# Patient Record
Sex: Female | Born: 1935
Health system: Southern US, Community
[De-identification: ages and names within clinical notes are randomized; demographics above are authoritative.]

## PROBLEM LIST (undated history)

## (undated) DIAGNOSIS — I4819 Other persistent atrial fibrillation: Secondary | ICD-10-CM

## (undated) DIAGNOSIS — N83209 Unspecified ovarian cyst, unspecified side: Secondary | ICD-10-CM

## (undated) DIAGNOSIS — C189 Malignant neoplasm of colon, unspecified: Secondary | ICD-10-CM

## (undated) DIAGNOSIS — K449 Diaphragmatic hernia without obstruction or gangrene: Secondary | ICD-10-CM

## (undated) DIAGNOSIS — D49519 Neoplasm of unspecified behavior of unspecified kidney: Secondary | ICD-10-CM

## (undated) DIAGNOSIS — K219 Gastro-esophageal reflux disease without esophagitis: Secondary | ICD-10-CM

## (undated) DIAGNOSIS — H9190 Unspecified hearing loss, unspecified ear: Secondary | ICD-10-CM

## (undated) DIAGNOSIS — R110 Nausea: Secondary | ICD-10-CM

## (undated) DIAGNOSIS — E785 Hyperlipidemia, unspecified: Secondary | ICD-10-CM

## (undated) DIAGNOSIS — H3530 Unspecified macular degeneration: Secondary | ICD-10-CM

## (undated) DIAGNOSIS — Z974 Presence of external hearing-aid: Secondary | ICD-10-CM

## (undated) DIAGNOSIS — Z973 Presence of spectacles and contact lenses: Secondary | ICD-10-CM

## (undated) DIAGNOSIS — H409 Unspecified glaucoma: Secondary | ICD-10-CM

## (undated) DIAGNOSIS — I712 Thoracic aortic aneurysm, without rupture: Secondary | ICD-10-CM

## (undated) DIAGNOSIS — R112 Nausea with vomiting, unspecified: Secondary | ICD-10-CM

## (undated) DIAGNOSIS — I10 Essential (primary) hypertension: Secondary | ICD-10-CM

## (undated) DIAGNOSIS — H548 Legal blindness, as defined in USA: Secondary | ICD-10-CM

## (undated) DIAGNOSIS — Z9889 Other specified postprocedural states: Secondary | ICD-10-CM

## (undated) HISTORY — DX: Essential (primary) hypertension: I10

## (undated) HISTORY — PX: TONSILLECTOMY: SUR1361

## (undated) HISTORY — DX: Other persistent atrial fibrillation: I48.19

## (undated) HISTORY — PX: NASAL SINUS SURGERY: SHX719

## (undated) HISTORY — DX: Thoracic aortic aneurysm, without rupture: I71.2

---

## 1988-08-12 HISTORY — PX: BREAST CYST EXCISION: SHX579

## 2014-11-07 HISTORY — PX: ESOPHAGOGASTRODUODENOSCOPY: SHX1529

## 2015-09-20 DIAGNOSIS — R1310 Dysphagia, unspecified: Secondary | ICD-10-CM | POA: Diagnosis not present

## 2015-09-20 DIAGNOSIS — E7800 Pure hypercholesterolemia, unspecified: Secondary | ICD-10-CM | POA: Diagnosis not present

## 2015-09-20 DIAGNOSIS — E559 Vitamin D deficiency, unspecified: Secondary | ICD-10-CM | POA: Diagnosis not present

## 2015-09-20 DIAGNOSIS — F329 Major depressive disorder, single episode, unspecified: Secondary | ICD-10-CM | POA: Diagnosis not present

## 2015-09-20 DIAGNOSIS — I10 Essential (primary) hypertension: Secondary | ICD-10-CM | POA: Diagnosis not present

## 2015-09-20 DIAGNOSIS — R001 Bradycardia, unspecified: Secondary | ICD-10-CM | POA: Diagnosis not present

## 2015-10-05 DIAGNOSIS — H353213 Exudative age-related macular degeneration, right eye, with inactive scar: Secondary | ICD-10-CM | POA: Diagnosis not present

## 2015-10-05 DIAGNOSIS — H353122 Nonexudative age-related macular degeneration, left eye, intermediate dry stage: Secondary | ICD-10-CM | POA: Diagnosis not present

## 2015-10-27 DIAGNOSIS — Z719 Counseling, unspecified: Secondary | ICD-10-CM | POA: Diagnosis not present

## 2015-10-27 DIAGNOSIS — H6121 Impacted cerumen, right ear: Secondary | ICD-10-CM | POA: Diagnosis not present

## 2015-12-06 DIAGNOSIS — H401114 Primary open-angle glaucoma, right eye, indeterminate stage: Secondary | ICD-10-CM | POA: Diagnosis not present

## 2015-12-06 DIAGNOSIS — H353213 Exudative age-related macular degeneration, right eye, with inactive scar: Secondary | ICD-10-CM | POA: Diagnosis not present

## 2015-12-06 DIAGNOSIS — H401121 Primary open-angle glaucoma, left eye, mild stage: Secondary | ICD-10-CM | POA: Diagnosis not present

## 2015-12-06 DIAGNOSIS — H353121 Nonexudative age-related macular degeneration, left eye, early dry stage: Secondary | ICD-10-CM | POA: Diagnosis not present

## 2016-03-22 DIAGNOSIS — H401121 Primary open-angle glaucoma, left eye, mild stage: Secondary | ICD-10-CM | POA: Diagnosis not present

## 2016-03-26 DIAGNOSIS — R1310 Dysphagia, unspecified: Secondary | ICD-10-CM | POA: Diagnosis not present

## 2016-03-26 DIAGNOSIS — F329 Major depressive disorder, single episode, unspecified: Secondary | ICD-10-CM | POA: Diagnosis not present

## 2016-03-26 DIAGNOSIS — R001 Bradycardia, unspecified: Secondary | ICD-10-CM | POA: Diagnosis not present

## 2016-03-26 DIAGNOSIS — I10 Essential (primary) hypertension: Secondary | ICD-10-CM | POA: Diagnosis not present

## 2016-03-26 DIAGNOSIS — H3530 Unspecified macular degeneration: Secondary | ICD-10-CM | POA: Diagnosis not present

## 2016-03-26 DIAGNOSIS — E7800 Pure hypercholesterolemia, unspecified: Secondary | ICD-10-CM | POA: Diagnosis not present

## 2016-03-26 DIAGNOSIS — E559 Vitamin D deficiency, unspecified: Secondary | ICD-10-CM | POA: Diagnosis not present

## 2016-03-26 DIAGNOSIS — M810 Age-related osteoporosis without current pathological fracture: Secondary | ICD-10-CM | POA: Diagnosis not present

## 2016-04-04 DIAGNOSIS — H353122 Nonexudative age-related macular degeneration, left eye, intermediate dry stage: Secondary | ICD-10-CM | POA: Diagnosis not present

## 2016-04-04 DIAGNOSIS — H353213 Exudative age-related macular degeneration, right eye, with inactive scar: Secondary | ICD-10-CM | POA: Diagnosis not present

## 2016-05-09 DIAGNOSIS — R1084 Generalized abdominal pain: Secondary | ICD-10-CM | POA: Diagnosis not present

## 2016-05-09 DIAGNOSIS — Z23 Encounter for immunization: Secondary | ICD-10-CM | POA: Diagnosis not present

## 2016-05-22 DIAGNOSIS — R109 Unspecified abdominal pain: Secondary | ICD-10-CM | POA: Diagnosis not present

## 2016-06-20 DIAGNOSIS — Z1231 Encounter for screening mammogram for malignant neoplasm of breast: Secondary | ICD-10-CM | POA: Diagnosis not present

## 2016-07-12 DIAGNOSIS — H401121 Primary open-angle glaucoma, left eye, mild stage: Secondary | ICD-10-CM | POA: Diagnosis not present

## 2016-07-12 DIAGNOSIS — H401114 Primary open-angle glaucoma, right eye, indeterminate stage: Secondary | ICD-10-CM | POA: Diagnosis not present

## 2016-07-25 DIAGNOSIS — R1030 Lower abdominal pain, unspecified: Secondary | ICD-10-CM | POA: Diagnosis not present

## 2016-07-25 DIAGNOSIS — R194 Change in bowel habit: Secondary | ICD-10-CM | POA: Diagnosis not present

## 2016-07-25 DIAGNOSIS — K5900 Constipation, unspecified: Secondary | ICD-10-CM | POA: Diagnosis not present

## 2016-09-02 DIAGNOSIS — K5790 Diverticulosis of intestine, part unspecified, without perforation or abscess without bleeding: Secondary | ICD-10-CM | POA: Diagnosis not present

## 2016-09-02 DIAGNOSIS — I10 Essential (primary) hypertension: Secondary | ICD-10-CM | POA: Diagnosis not present

## 2016-09-02 DIAGNOSIS — K56699 Other intestinal obstruction unspecified as to partial versus complete obstruction: Secondary | ICD-10-CM | POA: Diagnosis not present

## 2016-09-02 DIAGNOSIS — Z1211 Encounter for screening for malignant neoplasm of colon: Secondary | ICD-10-CM | POA: Diagnosis not present

## 2016-09-02 DIAGNOSIS — C189 Malignant neoplasm of colon, unspecified: Secondary | ICD-10-CM | POA: Diagnosis not present

## 2016-09-04 HISTORY — PX: COLONOSCOPY: SHX174

## 2016-09-06 DIAGNOSIS — R1030 Lower abdominal pain, unspecified: Secondary | ICD-10-CM | POA: Diagnosis not present

## 2016-09-10 DIAGNOSIS — K56699 Other intestinal obstruction unspecified as to partial versus complete obstruction: Secondary | ICD-10-CM | POA: Diagnosis not present

## 2016-09-10 DIAGNOSIS — K5900 Constipation, unspecified: Secondary | ICD-10-CM | POA: Diagnosis not present

## 2016-09-10 DIAGNOSIS — R935 Abnormal findings on diagnostic imaging of other abdominal regions, including retroperitoneum: Secondary | ICD-10-CM | POA: Diagnosis not present

## 2016-09-10 DIAGNOSIS — R1030 Lower abdominal pain, unspecified: Secondary | ICD-10-CM | POA: Diagnosis not present

## 2016-09-24 DIAGNOSIS — C189 Malignant neoplasm of colon, unspecified: Secondary | ICD-10-CM | POA: Diagnosis not present

## 2016-09-24 DIAGNOSIS — D4102 Neoplasm of uncertain behavior of left kidney: Secondary | ICD-10-CM | POA: Diagnosis not present

## 2016-09-26 ENCOUNTER — Other Ambulatory Visit: Payer: Self-pay | Admitting: Urology

## 2016-10-02 ENCOUNTER — Ambulatory Visit: Payer: Self-pay | Admitting: Surgery

## 2016-10-02 DIAGNOSIS — C187 Malignant neoplasm of sigmoid colon: Secondary | ICD-10-CM | POA: Diagnosis not present

## 2016-10-02 DIAGNOSIS — R160 Hepatomegaly, not elsewhere classified: Secondary | ICD-10-CM | POA: Diagnosis not present

## 2016-10-02 DIAGNOSIS — N839 Noninflammatory disorder of ovary, fallopian tube and broad ligament, unspecified: Secondary | ICD-10-CM | POA: Diagnosis not present

## 2016-10-02 DIAGNOSIS — R918 Other nonspecific abnormal finding of lung field: Secondary | ICD-10-CM | POA: Diagnosis not present

## 2016-10-02 DIAGNOSIS — Z01818 Encounter for other preprocedural examination: Secondary | ICD-10-CM | POA: Diagnosis not present

## 2016-10-02 DIAGNOSIS — K8000 Calculus of gallbladder with acute cholecystitis without obstruction: Secondary | ICD-10-CM | POA: Diagnosis not present

## 2016-10-02 DIAGNOSIS — N2889 Other specified disorders of kidney and ureter: Secondary | ICD-10-CM | POA: Diagnosis not present

## 2016-10-03 ENCOUNTER — Encounter (HOSPITAL_BASED_OUTPATIENT_CLINIC_OR_DEPARTMENT_OTHER): Payer: Self-pay | Admitting: *Deleted

## 2016-10-03 NOTE — Progress Notes (Signed)
SPOKE W/ PT'S DAUGHTER-N-LAW, [name].  PT IS HOH EVEN W/ HEARING AIDS, WILL NEED HER IN PRE-OP.   NPO AFTER MN.  ARRIVE AT [id number].  NEEDS ISTAT 8 AND EKG.  WILL TAKE AM MEDS DOS W/ SIPS OF WATER WITH EXCEPTION NO VITAMINS /  BENAZEPRIL/HCTZ.

## 2016-10-04 ENCOUNTER — Other Ambulatory Visit (HOSPITAL_COMMUNITY): Payer: Self-pay | Admitting: Surgery

## 2016-10-04 DIAGNOSIS — Z85038 Personal history of other malignant neoplasm of large intestine: Secondary | ICD-10-CM

## 2016-10-10 ENCOUNTER — Ambulatory Visit (HOSPITAL_BASED_OUTPATIENT_CLINIC_OR_DEPARTMENT_OTHER)
Admission: RE | Admit: 2016-10-10 | Discharge: 2016-10-10 | Disposition: A | Discharge status: Home / Self Care | Payer: Medicare Other | Source: Ambulatory Visit | Attending: Urology | Admitting: Urology

## 2016-10-10 ENCOUNTER — Encounter (HOSPITAL_BASED_OUTPATIENT_CLINIC_OR_DEPARTMENT_OTHER)
Admission: RE | Disposition: A | Discharge status: Home / Self Care | Payer: Self-pay | Source: Ambulatory Visit | Attending: Urology

## 2016-10-10 ENCOUNTER — Other Ambulatory Visit: Payer: Self-pay

## 2016-10-10 ENCOUNTER — Ambulatory Visit (HOSPITAL_BASED_OUTPATIENT_CLINIC_OR_DEPARTMENT_OTHER): Payer: Medicare Other | Admitting: Anesthesiology

## 2016-10-10 ENCOUNTER — Encounter (HOSPITAL_BASED_OUTPATIENT_CLINIC_OR_DEPARTMENT_OTHER): Payer: Self-pay | Admitting: *Deleted

## 2016-10-10 DIAGNOSIS — H3530 Unspecified macular degeneration: Secondary | ICD-10-CM | POA: Diagnosis not present

## 2016-10-10 DIAGNOSIS — I10 Essential (primary) hypertension: Secondary | ICD-10-CM | POA: Diagnosis not present

## 2016-10-10 DIAGNOSIS — D4102 Neoplasm of uncertain behavior of left kidney: Secondary | ICD-10-CM | POA: Diagnosis not present

## 2016-10-10 DIAGNOSIS — Z79899 Other long term (current) drug therapy: Secondary | ICD-10-CM | POA: Diagnosis not present

## 2016-10-10 DIAGNOSIS — K219 Gastro-esophageal reflux disease without esophagitis: Secondary | ICD-10-CM | POA: Insufficient documentation

## 2016-10-10 DIAGNOSIS — H5461 Unqualified visual loss, right eye, normal vision left eye: Secondary | ICD-10-CM | POA: Insufficient documentation

## 2016-10-10 DIAGNOSIS — C189 Malignant neoplasm of colon, unspecified: Secondary | ICD-10-CM | POA: Diagnosis not present

## 2016-10-10 DIAGNOSIS — E785 Hyperlipidemia, unspecified: Secondary | ICD-10-CM | POA: Insufficient documentation

## 2016-10-10 DIAGNOSIS — N289 Disorder of kidney and ureter, unspecified: Secondary | ICD-10-CM | POA: Diagnosis not present

## 2016-10-10 DIAGNOSIS — D49512 Neoplasm of unspecified behavior of left kidney: Secondary | ICD-10-CM | POA: Diagnosis not present

## 2016-10-10 HISTORY — DX: Neoplasm of unspecified behavior of unspecified kidney: D49.519

## 2016-10-10 HISTORY — DX: Presence of spectacles and contact lenses: Z97.3

## 2016-10-10 HISTORY — DX: Presence of external hearing-aid: Z97.4

## 2016-10-10 HISTORY — DX: Diaphragmatic hernia without obstruction or gangrene: K44.9

## 2016-10-10 HISTORY — DX: Gastro-esophageal reflux disease without esophagitis: K21.9

## 2016-10-10 HISTORY — DX: Unspecified macular degeneration: H35.30

## 2016-10-10 HISTORY — PX: CYSTOSCOPY WITH RETROGRADE PYELOGRAM, URETEROSCOPY AND STENT PLACEMENT: SHX5789

## 2016-10-10 HISTORY — DX: Unspecified ovarian cyst, unspecified side: N83.209

## 2016-10-10 HISTORY — DX: Nausea: R11.0

## 2016-10-10 HISTORY — DX: Unspecified glaucoma: H40.9

## 2016-10-10 HISTORY — DX: Legal blindness, as defined in USA: H54.8

## 2016-10-10 HISTORY — DX: Hyperlipidemia, unspecified: E78.5

## 2016-10-10 HISTORY — DX: Unspecified hearing loss, unspecified ear: H91.90

## 2016-10-10 HISTORY — DX: Malignant neoplasm of colon, unspecified: C18.9

## 2016-10-10 LAB — POCT I-STAT, CHEM 8
BUN: 11 mg/dL (ref 6–20)
CALCIUM ION: 1.25 mmol/L (ref 1.15–1.40)
CHLORIDE: 103 mmol/L (ref 101–111)
CREATININE: 0.7 mg/dL (ref 0.44–1.00)
GLUCOSE: 99 mg/dL (ref 65–99)
HCT: 41 % (ref 36.0–46.0)
Hemoglobin: 13.9 g/dL (ref 12.0–15.0)
Potassium: 4 mmol/L (ref 3.5–5.1)
Sodium: 143 mmol/L (ref 135–145)
TCO2: 26 mmol/L (ref 0–100)

## 2016-10-10 SURGERY — CYSTOURETEROSCOPY, WITH RETROGRADE PYELOGRAM AND STENT INSERTION
Anesthesia: General | Site: Renal | Laterality: Left

## 2016-10-10 MED ORDER — FENTANYL CITRATE (PF) 100 MCG/2ML IJ SOLN
25.0000 ug | INTRAMUSCULAR | Status: DC | PRN
  Filled 2016-10-10: qty 1

## 2016-10-10 MED ORDER — PROPOFOL 10 MG/ML IV BOLUS
INTRAVENOUS | Status: AC
  Filled 2016-10-10: qty 20

## 2016-10-10 MED ORDER — DEXTROSE 5 % IV SOLN: 5.0000 mg/kg | INTRAVENOUS | Status: DC

## 2016-10-10 MED ORDER — IOHEXOL 300 MG/ML  SOLN
INTRAMUSCULAR | Status: DC | PRN
  Administered 2016-10-10: 15 mL

## 2016-10-10 MED ORDER — PROMETHAZINE HCL 25 MG/ML IJ SOLN
6.2500 mg | INTRAMUSCULAR | Status: DC | PRN
  Filled 2016-10-10: qty 1

## 2016-10-10 MED ORDER — LACTATED RINGERS IV SOLN
INTRAVENOUS | Status: DC
  Administered 2016-10-10: 12:00:00 via INTRAVENOUS
  Filled 2016-10-10: qty 1000

## 2016-10-10 MED ORDER — SODIUM CHLORIDE 0.9 % IR SOLN
Status: DC | PRN
  Administered 2016-10-10: 3000 mL

## 2016-10-10 MED ORDER — PROPOFOL 10 MG/ML IV BOLUS
INTRAVENOUS | Status: DC | PRN
  Administered 2016-10-10: 150 mg via INTRAVENOUS
  Administered 2016-10-10: 50 mg via INTRAVENOUS

## 2016-10-10 MED ORDER — GENTAMICIN SULFATE 40 MG/ML IJ SOLN
320.0000 mg | Freq: Once | INTRAVENOUS | Status: AC
  Administered 2016-10-10: 320 mg via INTRAVENOUS
  Filled 2016-10-10: qty 8

## 2016-10-10 MED ORDER — BUPIVACAINE LIPOSOME 1.3 % IJ SUSP
20.0000 mL | INTRAMUSCULAR | Status: DC
  Filled 2016-10-10: qty 20

## 2016-10-10 MED ORDER — LIDOCAINE 2% (20 MG/ML) 5 ML SYRINGE
INTRAMUSCULAR | Status: DC | PRN
  Administered 2016-10-10: 40 mg via INTRAVENOUS
  Administered 2016-10-10: 60 mg via INTRAVENOUS

## 2016-10-10 MED ORDER — FENTANYL CITRATE (PF) 100 MCG/2ML IJ SOLN
INTRAMUSCULAR | Status: DC | PRN
  Administered 2016-10-10 (×4): 25 ug via INTRAVENOUS

## 2016-10-10 MED ORDER — ONDANSETRON HCL 4 MG/2ML IJ SOLN
INTRAMUSCULAR | Status: DC | PRN
  Administered 2016-10-10: 4 mg via INTRAVENOUS

## 2016-10-10 MED ORDER — PHENYLEPHRINE 40 MCG/ML (10ML) SYRINGE FOR IV PUSH (FOR BLOOD PRESSURE SUPPORT)
PREFILLED_SYRINGE | INTRAVENOUS | Status: DC | PRN
  Administered 2016-10-10 (×4): 80 ug via INTRAVENOUS

## 2016-10-10 MED ORDER — FENTANYL CITRATE (PF) 100 MCG/2ML IJ SOLN
INTRAMUSCULAR | Status: AC
  Filled 2016-10-10: qty 2

## 2016-10-10 MED ORDER — TRAMADOL HCL 50 MG PO TABS
ORAL_TABLET | ORAL | Status: AC
Start: 2016-10-10 — End: 2016-10-10
  Filled 2016-10-10: qty 1

## 2016-10-10 MED ORDER — TRAMADOL HCL 50 MG PO TABS
50.0000 mg | ORAL_TABLET | Freq: Once | ORAL | Status: AC
  Administered 2016-10-10: 50 mg via ORAL
  Filled 2016-10-10: qty 1

## 2016-10-10 MED ORDER — KETOROLAC TROMETHAMINE 30 MG/ML IJ SOLN
15.0000 mg | Freq: Once | INTRAMUSCULAR | Status: DC | PRN
  Filled 2016-10-10: qty 1

## 2016-10-10 MED ORDER — TRAMADOL HCL 50 MG PO TABS: 50.0000 mg | ORAL_TABLET | Freq: Four times a day (QID) | ORAL | 0 refills | Status: DC | PRN

## 2016-10-10 MED ORDER — DEXAMETHASONE SODIUM PHOSPHATE 4 MG/ML IJ SOLN
INTRAMUSCULAR | Status: DC | PRN
  Administered 2016-10-10: 8 mg via INTRAVENOUS

## 2016-10-10 SURGICAL SUPPLY — 30 items
BAG DRAIN URO-CYSTO SKYTR STRL (DRAIN) ×3 IMPLANT
BASKET DAKOTA 1.9FR 11X120 (BASKET) IMPLANT
BASKET LASER NITINOL 1.9FR (BASKET) IMPLANT
BASKET ZERO TIP NITINOL 2.4FR (BASKET) IMPLANT
CATH INTERMIT  6FR 70CM (CATHETERS) ×3 IMPLANT
CLOTH BEACON ORANGE TIMEOUT ST (SAFETY) ×3 IMPLANT
FIBER LASER FLEXIVA 365 (UROLOGICAL SUPPLIES) IMPLANT
FIBER LASER TRAC TIP (UROLOGICAL SUPPLIES) IMPLANT
GLOVE BIO SURGEON STRL SZ 6.5 (GLOVE) ×2 IMPLANT
GLOVE BIO SURGEON STRL SZ7.5 (GLOVE) ×3 IMPLANT
GLOVE BIO SURGEONS STRL SZ 6.5 (GLOVE) ×1
GLOVE BIOGEL PI IND STRL 6.5 (GLOVE) ×2 IMPLANT
GLOVE BIOGEL PI INDICATOR 6.5 (GLOVE) ×4
GOWN STRL REUS W/ TWL LRG LVL3 (GOWN DISPOSABLE) ×2 IMPLANT
GOWN STRL REUS W/TWL LRG LVL3 (GOWN DISPOSABLE) ×10 IMPLANT
GUIDEWIRE ANG ZIPWIRE 038X150 (WIRE) ×3 IMPLANT
GUIDEWIRE STR DUAL SENSOR (WIRE) ×3 IMPLANT
IV NS 1000ML (IV SOLUTION) ×2
IV NS 1000ML BAXH (IV SOLUTION) ×1 IMPLANT
IV NS IRRIG 3000ML ARTHROMATIC (IV SOLUTION) ×3 IMPLANT
KIT RM TURNOVER CYSTO AR (KITS) ×3 IMPLANT
MANIFOLD NEPTUNE II (INSTRUMENTS) ×3 IMPLANT
NS IRRIG 500ML POUR BTL (IV SOLUTION) ×3 IMPLANT
PACK CYSTO (CUSTOM PROCEDURE TRAY) ×3 IMPLANT
SHEATH ACCESS URETERAL 24CM (SHEATH) ×3 IMPLANT
STENT POLARIS 5FRX24 (STENTS) ×3 IMPLANT
SYRINGE 10CC LL (SYRINGE) ×3 IMPLANT
TUBE CONNECTING 12'X1/4 (SUCTIONS)
TUBE CONNECTING 12X1/4 (SUCTIONS) IMPLANT
TUBE FEEDING 8FR 16IN STR KANG (MISCELLANEOUS) ×3 IMPLANT

## 2016-10-10 NOTE — Anesthesia Preprocedure Evaluation (Addendum)
Anesthesia Evaluation  Patient identified by MRN, date of birth, ID band Patient awake  General Assessment Comment:Colon cancer [facility])  dx via biospy from colonoscopy-- malignant ---  currently having work-up done w/ dr gross (general surgeon) and coordinate surgery w/ urologsit for renal tumor   Reviewed: Allergy & Precautions, NPO status , Patient's Chart, lab work & pertinent test results  Airway Mallampati: II  TM Distance: >3 FB Neck ROM: Full    Dental no notable dental hx.    Pulmonary neg pulmonary ROS,    Pulmonary exam normal breath sounds clear to auscultation       Cardiovascular hypertension, Pt. on medications  Rhythm:Irregular Rate:Normal     Neuro/Psych negative neurological ROS  negative psych ROS   GI/Hepatic Neg liver ROS, GERD  Medicated,  Endo/Other  negative endocrine ROS  Renal/GU negative Renal ROS  negative genitourinary   Musculoskeletal negative musculoskeletal ROS (+)   Abdominal   Peds negative pediatric ROS (+)  Hematology negative hematology ROS (+)   Anesthesia Other Findings   Reproductive/Obstetrics negative OB ROS                            Anesthesia Physical Anesthesia Plan  ASA: III  Anesthesia Plan: General   Post-op Pain Management:    Induction: Intravenous  Airway Management Planned: LMA  Additional Equipment:   Intra-op Plan:   Post-operative Plan: Extubation in OR  Informed Consent: I have reviewed the patients History and Physical, chart, labs and discussed the procedure including the risks, benefits and alternatives for the proposed anesthesia with the patient or authorized representative who has indicated his/her understanding and acceptance.   Dental advisory given  Plan Discussed with: CRNA and Surgeon  Anesthesia Plan Comments:        Anesthesia Quick Evaluation

## 2016-10-10 NOTE — Anesthesia Postprocedure Evaluation (Signed)
Anesthesia Post Note  Patient: Mary [name]  Procedure(s) Performed: Procedure(s) (LRB): CYSTOSCOPY WITH RETROGRADE PYELOGRAM, DIAGNOSTIC URETEROSCOPY  AND STENT PLACEMENT (Left)  Patient location during evaluation: PACU Anesthesia Type: General Level of consciousness: awake and alert Pain management: pain level controlled Vital Signs Assessment: post-procedure vital signs reviewed and stable Respiratory status: spontaneous breathing, nonlabored ventilation, respiratory function stable and patient connected to nasal cannula oxygen Cardiovascular status: blood pressure returned to baseline and stable Postop Assessment: no signs of nausea or vomiting Anesthetic complications: no Comments: New onset a fib. Cardiology consulted. Rate in the 80's, ok to go home. Cardiology will call her tonight or tomorrow to set up appointment to be evaluated.l       Last Vitals:  Vitals:   10/10/16 1245 10/10/16 1300  BP: (!) 153/86 (!) 146/100  Pulse: 89 87  Resp: 19 15  Temp:      Last Pain:  Vitals:   10/10/16 1031  TempSrc: Oral                 [name]

## 2016-10-10 NOTE — H&P (Signed)
Mary [name] is an 83 y.o. female.    Chief Complaint: Pre-op LEFT Ureteroscopy with biopsy  HPI:   1 - Endophytic Left Renal Mass - enophytic left upper pole / hilar mass by CT 2018 on eval abd pain. 2.7cm, 1 artery / 1 vein left renovascular anatomy. Some small perirenal nodules within gerotas noted as well. NO h/o hematuria. Mass appears to be ? arising from upper pole collecting system.   2 - Colon Cancer - new colon cancer DX'd by GI MD in [place].  This was found on eval changed bowel habits and constipation.   PMH sig for colon cancer (DX'd 08/2016 by colonocopy), HLD, HTN. NO h/o ischemic CV disease / blood thinners. No priro chest or abdominal surgeries. Her PCP is Moshe Cipro MD in [place].   Today " [name] " is seen to proceed with LEFT ureteroscopy / biopsy to further characterize endophytic left renal mass. NO interval fevers. Most recent UA without infectious parameters.    Past Medical History:  Diagnosis Date  . Colon cancer [facility])    dx via biospy from colonoscopy-- malignant ---  currently having work-up done w/ dr gross (general surgeon) and coordinate surgery w/ urologsit for renal tumor  . GERD (gastroesophageal reflux disease)   . Glaucoma   . Hiatal hernia   . HOH (hard of hearing)    bilateral even w/ hearing aids  . Hyperlipidemia   . Hypertension   . Kidney tumor    left side  . Legally blind in right eye, as defined in [place]    due to macular degeneration  . Macular degeneration   . Nausea    intermittant due to colon mass  . Ovarian cyst   . Wears glasses   . Wears hearing aid    bilateral    Past Surgical History:  Procedure Laterality Date  . BREAST CYST EXCISION  1990   benign  . COLONOSCOPY  09/04/2016  . ESOPHAGOGASTRODUODENOSCOPY  11/07/2014  . NASAL SINUS SURGERY    . TONSILLECTOMY      History reviewed. No pertinent family history. Social History:  reports that she has never smoked. She has never used smokeless tobacco. She  reports that she does not drink alcohol or use drugs.  Allergies:  Allergies  Allergen Reactions  . Bactrim [Sulfamethoxazole-Trimethoprim] Nausea And Vomiting    No prescriptions prior to admission.    No results found for this or any previous visit (from the past 48 hour(s)). No results found.  Review of Systems  Constitutional: Negative.  Negative for chills and fever.  HENT: Negative.   Eyes: Negative.   Respiratory: Negative.   Cardiovascular: Negative.   Gastrointestinal: Negative.   Genitourinary: Negative.   Musculoskeletal: Negative.   Skin: Negative.   Neurological: Negative.   Endo/Heme/Allergies: Negative.   Psychiatric/Behavioral: Negative.     Height 5\' 6"  (1.676 m), weight 80.7 kg (178 lb). Physical Exam  Constitutional: She appears well-developed.  HENT:  Head: Normocephalic.  Eyes: Pupils are equal, round, and reactive to light.  Neck: Normal range of motion.  Cardiovascular: Normal rate.   Respiratory: Effort normal.  GI: Soft.  Genitourinary:  Genitourinary Comments: No CVAT at present.   Neurological: She is alert.  Skin: Skin is warm.  Psychiatric: She has a normal mood and affect.     Assessment/Plan  1 - Endophytic Left Renal Mass - proceed as planned with cysto, retrograges, and LEFT ureteroscopy / possible biopsy to furhter characterize left renal mass  and hopefully distinguish if urothelial origin or not. Risks, benefits, alternatives, expected peri-op course, need for peri-op ureteral stenting, need for possible stage approach discussed previously and reiterated today.   Alexis Frock, MD 10/10/2016, 6:38 AM

## 2016-10-10 NOTE — Discharge Instructions (Signed)
1 - You may have urinary urgency (bladder spasms) and bloody urine on / off with stent in place. This is normal.  2 - Remove tethered stent on Friday morning at home by pulling on string, then blue-white plastic tubing, and discarding. Office is open Friday in any acute issues arise.   3 - Call MD or go to ER for fever >102, severe pain / nausea / vomiting not relieved by medications, or acute change in medical status [facility] [phone] Post Ureteroscopy With or Without Stent Instructions  Definitions:  Ureter: The duct that transports urine from the kidney to the bladder. Stent:   A plastic hollow tube that is placed into the ureter, from the kidney to the                 bladder to prevent the ureter from swelling shut.  GENERAL INSTRUCTIONS:  Despite the fact that no skin incisions were used, the area around the ureter and bladder is raw and irritated. The stent is a foreign body which will further irritate the bladder wall. This irritation is manifested by increased frequency of urination, both day and night, and by an increase in the urge to urinate. In some, the urge to urinate is present almost always. Sometimes the urge is strong enough that you may not be able to stop yourself from urinating. The only real cure is to remove the stent and then give time for the bladder wall to heal which can't be done until the danger of the ureter swelling shut has passed, which varies.  You may see some blood in your urine while the stent is in place and a few days afterwards. Do not be alarmed, even if the urine was clear for a while. Get off your feet and drink lots of fluids until clearing occurs. If you start to pass clots or don't improve, call us.  DIET: You may return to your normal diet immediately. Because of the raw surface of your bladder, alcohol, spicy foods, acid type foods and drinks with caffeine may cause irritation or frequency and should be used in moderation.  To keep your urine flowing freely and to avoid constipation, drink plenty of fluids during the day ( 8-10 glasses ). Tip: Avoid cranberry juice because it is very acidic.  ACTIVITY: Your physical activity doesn't need to be restricted. However, if you are very active, you may see some blood in your urine. We suggest that you reduce your activity under these circumstances until the bleeding has stopped.  BOWELS: It is important to keep your bowels regular during the postoperative period. Straining with bowel movements can cause bleeding. A bowel movement every other day is reasonable. Use a mild laxative if needed, such as Milk of Magnesia 2-3 tablespoons, or 2 Dulcolax tablets. Call if you continue to have problems. If you have been taking narcotics for pain, before, during or after your surgery, you may be constipated. Take a laxative if necessary.   MEDICATION: You should resume your pre-surgery medications unless told not to. In addition you will often be given an antibiotic to prevent infection. These should be taken as prescribed until the bottles are finished unless you are having an unusual reaction to one of the drugs.  PROBLEMS YOU SHOULD REPORT TO [place]:  Fevers over 100.5 Fahrenheit.  Heavy bleeding, or clots ( See above notes about blood in urine ).  Inability to urinate.  Drug reactions ( hives, rash, nausea, vomiting, diarrhea ).  Severe burning or pain with urination that is not improving.  FOLLOW-UP: You will need a follow-up appointment to monitor your progress. Call for this appointment at the number listed above. Usually the first appointment will be about three to fourteen days after your surgery.      Post Anesthesia Home Care Instructions  Activity: Get plenty of rest for the remainder of the day. A responsible adult should stay with you for 24 hours following the procedure.  For the next 24 hours, DO NOT: -Drive a car -Paediatric nurse -Drink alcoholic  beverages -Take any medication unless instructed by your physician -Make any legal decisions or sign important papers.  Meals: Start with liquid foods such as gelatin or soup. Progress to regular foods as tolerated. Avoid greasy, spicy, heavy foods. If nausea and/or vomiting occur, drink only clear liquids until the nausea and/or vomiting subsides. Call your physician if vomiting continues.  Special Instructions/Symptoms: Your throat may feel dry or sore from the anesthesia or the breathing tube placed in your throat during surgery. If this causes discomfort, gargle with warm salt water. The discomfort should disappear within 24 hours.  If you had a scopolamine patch placed behind your ear for the management of post- operative nausea and/or vomiting:  1. The medication in the patch is effective for 72 hours, after which it should be removed.  Wrap patch in a tissue and discard in the trash. Wash hands thoroughly with soap and water. 2. You may remove the patch earlier than 72 hours if you experience unpleasant side effects which may include dry mouth, dizziness or visual disturbances. 3. Avoid touching the patch. Wash your hands with soap and water after contact with the patch.

## 2016-10-10 NOTE — Transfer of Care (Signed)
Last Vitals:  Vitals:   10/10/16 1031 10/10/16 1243  BP: (!) 156/78   Pulse: 96   Resp: 16   Temp: 36.6 C (P) 37.1 C    Last Pain:  Vitals:   10/10/16 1031  TempSrc: Oral         Immediate Anesthesia Transfer of Care Note  Patient: Mary [name]  Procedure(s) Performed: Procedure(s) (LRB): CYSTOSCOPY WITH RETROGRADE PYELOGRAM, DIAGNOSTIC URETEROSCOPY  AND STENT PLACEMENT (Left)  Patient Location: PACU  Anesthesia Type: General  Level of Consciousness: awake, alert  and oriented  Airway & Oxygen Therapy: Patient Spontanous Breathing and Patient connected to face mask oxygen  Post-op Assessment: Report given to PACU RN and Post -op Vital signs reviewed and stable  Post vital signs: Reviewed and stable. Questionable Afib on monitor.  12 Lead EKG obtained in PACU shows Afib.  Dr. Kalman Shan aware. Cariology consult requested by Dr. Kalman Shan.  Complications: No apparent anesthesia complications

## 2016-10-10 NOTE — Anesthesia Procedure Notes (Signed)
Procedure Name: LMA Insertion Date/Time: 10/10/2016 12:06 PM Performed by: Myrtie Soman Pre-anesthesia Checklist: Patient identified, Emergency Drugs available, Suction available and Patient being monitored Patient Re-evaluated:Patient Re-evaluated prior to inductionOxygen Delivery Method: Circle system utilized Preoxygenation: Pre-oxygenation with 100% oxygen Intubation Type: IV induction Ventilation: Mask ventilation without difficulty LMA: LMA inserted LMA Size: 4.0 Number of attempts: 1 Airway Equipment and Method: Bite block Placement Confirmation: positive ETCO2 Tube secured with: Tape Dental Injury: Teeth and Oropharynx as per pre-operative assessment

## 2016-10-10 NOTE — Brief Op Note (Signed)
10/10/2016  12:26 PM  PATIENT:  Mary [name]  83 y.o. female  PRE-OPERATIVE DIAGNOSIS:  LEFT RENAL TUMOR  POST-OPERATIVE DIAGNOSIS:  LEFT RENAL TUMOR  PROCEDURE:  Procedure(s): CYSTOSCOPY WITH RETROGRADE PYELOGRAM, DIAGNOSTIC URETEROSCOPY  AND STENT PLACEMENT (Left)  SURGEON:  Surgeon(s) and Role:    * Alexis Frock, MD - Primary  PHYSICIAN ASSISTANT:   ASSISTANTS: none   ANESTHESIA:   general  EBL:  No intake/output data recorded.  BLOOD ADMINISTERED:none  DRAINS: none   LOCAL MEDICATIONS USED:  NONE  SPECIMEN:  No Specimen  DISPOSITION OF SPECIMEN:  N/A  COUNTS:  YES  TOURNIQUET:  * No tourniquets in log *  DICTATION: .Other Dictation: Dictation Number [id number]  PLAN OF CARE: Discharge to home after PACU  PATIENT DISPOSITION:  PACU - hemodynamically stable.   Delay start of Pharmacological VTE agent (>24hrs) due to surgical blood loss or risk of bleeding: yes

## 2016-10-11 ENCOUNTER — Encounter (HOSPITAL_BASED_OUTPATIENT_CLINIC_OR_DEPARTMENT_OTHER): Payer: Self-pay | Admitting: Urology

## 2016-10-11 NOTE — Op Note (Signed)
NAMEMALLORIE, Mary [name] NO.:  [id number]  MEDICAL RECORD NO.:  [id number]  LOCATION:                                 FACILITY:  PHYSICIAN:  Alexis Frock, MD     DATE OF BIRTH:  02/07/1933  DATE OF PROCEDURE: 10/10/2016                              OPERATIVE REPORT   DIAGNOSIS:  Left endophytic renal mass.  PROCEDURE: 1. Cystoscopy, left retrograde pyelogram interpretation. 2. Left diagnostic ureteroscopy. 3. Insertion of left ureteral stent, 5 x 24 Polaris with tether.  ESTIMATED BLOOD LOSS:  Nil.  COMPLICATION:  None.  SPECIMEN:  None.  FINDINGS: 1. Unremarkable bladder. 2. Unremarkable left retrograde pyelogram. 3. No evidence of intraluminal urothelial neoplasm whatsoever with     inspection of the left kidney and ureter times several. 4. Successful placement of left ureteral stent, proximal in renal     pelvis and distal in urinary bladder.  INDICATIONS:  Mary [name] is a pleasant and vigorous 83 year old lady, who was found on workup of constipation and bowel changes to have likely a sigmoid colon cancer, but also an enhancing left renal mass that was quite endophytic and somewhat diffuse with interface of the kidney. This was concerning for possible urothelial primary versus a cortical primary cancer of the kidney, which would of course be managed quite differently.  It was felt that endoscopic exam to help rule out urothelial neoplasms clearly warranted before embarking on surgical therapy, which would likely include both management of colon cancer and kidney cancer in a single stage.  Informed consent was obtained and placed in the medical record.  PROCEDURE IN DETAIL:  The patient being, Mary [name]; procedure being, left diagnostic ureteroscopy possible biopsy, and stent was confirmed. Procedure was carried out.  Time-out was carried.  Intravenous antibiotics were administered.  General LMA anesthesia was induced.  The patient was placed  into a low lithotomy position.  Sterile field was created by prepping and draping the patient's vagina, introitus, and proximal thighs using iodine.  Next, cystourethroscopy was performed using a rigid cystoscope with offset lens.  Inspection of urinary bladder revealed no diverticula, calcifications, papillary lesions. Ureteral orifices were singleton bilaterally.  The left ureteral orifice was cannulated with a 6-French end-hole catheter and left retrograde pyelogram was obtained.  Left retrograde pyelogram demonstrated a single left ureter with single- system left kidney.  No filling defects or narrowing noted whatsoever. A 0.038 Zip wire was advanced lower pole set aside as a safety wire.  An 8-French feeding tube placed in the bladder for pressure release and a semi-rigid ureteroscopy from the distal four-fifths of the left ureter alongside a separate Sensor working wire.  No mucosal abnormalities were found whatsoever.  This was exchanged for a 12/14, 24-cm ureteral access sheath at the level of proximal ureter using continuous fluoroscopic guidance over the Sensor working wire.  Next, flexible digital ureteroscopy was performed using a flexible ureteroscope, which allowed inspection of the proximal left ureter and systematic inspection of the left kidney including all calices x3.  There was no evidence of intraluminal tumor seen whatsoever.  Certainly, no papular lesions.  It was felt that this effectively ruled out urothelial  primary to the patient's left renal mass.  The scope and sheath were removed under continuous ureteroscopic vision.  No mucosal abnormalities were found. Given access sheath use it was felt that brief interval stenting would be warranted.  As such, a new 5 x 24 Polaris-type stent was placed with remaining safety wire using fluoroscopic guidance.  Good proximal and distal deployment were noted.  Tether was left in place and fashioned to the thigh and  procedure was terminated.  The patient tolerated the procedure well.  There were no immediate periprocedural complications. The patient was taken to the Postanesthesia Care Unit in stable condition.    ______________________________ Alexis Frock, MD   ______________________________ Alexis Frock, MD    TM/MEDQ  D:  10/10/2016  T:  10/11/2016  Job:  [id number]

## 2016-10-15 ENCOUNTER — Ambulatory Visit (HOSPITAL_COMMUNITY)
Admission: RE | Admit: 2016-10-15 | Discharge: 2016-10-15 | Disposition: A | Discharge status: Home / Self Care | Payer: Medicare Other | Source: Ambulatory Visit | Attending: Surgery | Admitting: Surgery

## 2016-10-15 DIAGNOSIS — R935 Abnormal findings on diagnostic imaging of other abdominal regions, including retroperitoneum: Secondary | ICD-10-CM | POA: Insufficient documentation

## 2016-10-15 DIAGNOSIS — Z85038 Personal history of other malignant neoplasm of large intestine: Secondary | ICD-10-CM | POA: Diagnosis not present

## 2016-10-15 DIAGNOSIS — N134 Hydroureter: Secondary | ICD-10-CM | POA: Diagnosis not present

## 2016-10-15 MED ORDER — GADOBENATE DIMEGLUMINE 529 MG/ML IV SOLN
15.0000 mL | Freq: Once | INTRAVENOUS | Status: AC | PRN
  Administered 2016-10-15: 15 mL via INTRAVENOUS

## 2016-10-16 ENCOUNTER — Ambulatory Visit: Payer: Self-pay | Admitting: Surgery

## 2016-10-17 DIAGNOSIS — D4102 Neoplasm of uncertain behavior of left kidney: Secondary | ICD-10-CM | POA: Diagnosis not present

## 2016-10-23 ENCOUNTER — Telehealth: Payer: Self-pay | Admitting: Cardiovascular Disease

## 2016-10-23 ENCOUNTER — Telehealth: Payer: Self-pay | Admitting: Hematology

## 2016-10-23 NOTE — Telephone Encounter (Signed)
Appt has been scheduled for the pt to see Dr. Burr Medico on 3/16 at 330pm. Left the pt a vm with the appt date and time.

## 2016-10-23 NOTE — Telephone Encounter (Signed)
Received records from [facility] for appointment on 10/31/16 with Dr Oval Linsey.  Records put with Dr Blenda Mounts schedule for 10/31/16. lp

## 2016-10-25 ENCOUNTER — Telehealth: Payer: Self-pay | Admitting: Hematology

## 2016-10-25 ENCOUNTER — Telehealth: Payer: Self-pay | Admitting: Cardiovascular Disease

## 2016-10-25 ENCOUNTER — Ambulatory Visit (HOSPITAL_BASED_OUTPATIENT_CLINIC_OR_DEPARTMENT_OTHER): Payer: Medicare Other | Admitting: Hematology

## 2016-10-25 ENCOUNTER — Encounter: Payer: Self-pay | Admitting: Hematology

## 2016-10-25 DIAGNOSIS — N83202 Unspecified ovarian cyst, left side: Secondary | ICD-10-CM

## 2016-10-25 DIAGNOSIS — C186 Malignant neoplasm of descending colon: Secondary | ICD-10-CM | POA: Diagnosis not present

## 2016-10-25 DIAGNOSIS — Z803 Family history of malignant neoplasm of breast: Secondary | ICD-10-CM

## 2016-10-25 DIAGNOSIS — N289 Disorder of kidney and ureter, unspecified: Secondary | ICD-10-CM | POA: Diagnosis not present

## 2016-10-25 DIAGNOSIS — Z801 Family history of malignant neoplasm of trachea, bronchus and lung: Secondary | ICD-10-CM

## 2016-10-25 DIAGNOSIS — I4891 Unspecified atrial fibrillation: Secondary | ICD-10-CM

## 2016-10-25 DIAGNOSIS — N1330 Unspecified hydronephrosis: Secondary | ICD-10-CM | POA: Diagnosis not present

## 2016-10-25 DIAGNOSIS — I10 Essential (primary) hypertension: Secondary | ICD-10-CM | POA: Diagnosis not present

## 2016-10-25 DIAGNOSIS — K769 Liver disease, unspecified: Secondary | ICD-10-CM

## 2016-10-25 DIAGNOSIS — C187 Malignant neoplasm of sigmoid colon: Secondary | ICD-10-CM | POA: Insufficient documentation

## 2016-10-25 NOTE — Progress Notes (Signed)
[facility]  [phone] Fax:([phone]) [place] Note   Patient Care Team: Moshe Cipro, MD as PCP - General (Internal Medicine) Alexis Frock, MD as Consulting Physician (Urology) Michael Boston, MD as Consulting Physician (General Surgery) Toma Deiters, MD as Referring Physician (Gastroenterology) 10/25/2016  CHIEF COMPLAINTS/PURPOSE OF CONSULTATION:  Recently diagnosed sigmoid colon Adenocarcinoma    Cancer of left colon ([place])   09/02/2016 Procedure    Colonoscopy Diverticulosis (scattered). Pt has about 10 cm structure starting at 20 cm with abnormal mucosa. Biopsies taken.       09/04/2016 Pathology Results    MODERATELY DIFFERENTIATED ADENOCARCINOMA OF COLON AT 20 CM      09/04/2016 Initial Diagnosis    Cancer of left colon ([place])      09/06/2016 Imaging    CT CAP w Contrast 1. No acute abdominopelvic process.  2. Heterogenous hypodense area involving the medial edge of the left kidney with multiple small soft tissue like stranding involving the left pararenal space. Findings are suspicious for malignancy of the left kidney with small pararenal metastases. MRI abdomen with contrast should be considered to further evaluate.  3. Vague hypodensity involving the lateral segment of the liver. This can also be reevaluated with MRI.  4. Partially visualized 5 mm posterior right lower lobe nodular density. CT chest without contrast follow up is recommended.  5. Complex multilobular left ovarian lesion may reflect a cluster of cysts.      10/15/2016 Imaging    MRI abdomen pelvis 1. There are 2 enhancing hepatic lesions highly worrisome for metastatic disease, likely metastatic colon cancer. 2. The lesion of concern in the anterior suprahilar lip of the left kidney is also enhancing, worrisome for neoplasm. This has a somewhat atypical appearance for renal cell carcinoma and could reflect a metastasis as well. 3. New left-sided  hydroureter with delayed contrast excretion consistent with distal ureteral obstruction. Specific etiology uncertain. 4. Complex cystic and solid left adnexal lesion could reflect cystic neoplasm, although does not appear aggressive or appear to reflect the cause of the distal left ureteral obstruction. 5. Sigmoid colon wall thickening could be related to reported newly diagnosed colon cancer.      HISTORY OF PRESENTING ILLNESS (10/25/2016):  Mary [name] 83 y.o. female is here because of adenocarcinoma of the colon and a left renal mass. The pt noticed worsening episodes of abdominal cramping and nausea with bowel movements in July 2017. She had an abdominal US performed, which was unremarkable. A colonoscopy was then performed at the patient's request, which found a mass about 20 cm from the anal verge. Biopsy showed moderately differentiated adenocarcinoma. MRI done, with a lesion noticed in the left kidney near the pelvis, a 11 mm hepatic lesion, 4 mm lung nodule, and possible cystic left ovarian mass. Pt saw Dr. Johney Maine for information on abdominal surgery for her colon, which she declined. She also saw Dr. Lorna Dibble, who is considering a nephrectomy, but is planning a sternoscopy to better characterize the kidney lesion. She presents today for continued treatment.    She presents today with her son and daughter-in-law. The symptoms started in late July 2017. She had some vomiting, abdominal pain, and constipation, but not bleeding. Her PCP never found any anemia. Initially she lost her appetite, but now she is taking multiple vitamins and supplements, so her appetite is back. Her bowel has improved, but she still has abdominal pain that comes and goes, lasting minutes. The pain is tolerable with  aleve. She did not tolerate Tramadol well; nausea and vomiting. She has some occasional left sides back pain. Denies weight loss, or any other concerns. She is able to do everything she needs to around the  house.   She had a partial hysterectomy, and a lumpectomy. She has a history of HTN, atrial fibrillation, high cholesterol. She has some hearing loss and can not see out of her right eye. Not a diabetic, but her son and her mother are. No family history of colon cancer. Her sister had breast cancer in her 59's and her brother had lung cancer in his 83's. Never smoker. Doesn't drink alcohol. 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 She has two sons; one lives an hour away, and the other lives in [place]. She lives alone.   MEDICAL HISTORY:  Past Medical History:  Diagnosis Date  . Colon cancer [facility])    dx via biospy from colonoscopy-- malignant ---  currently having work-up done w/ dr gross (general surgeon) and coordinate surgery w/ urologsit for renal tumor  . GERD (gastroesophageal reflux disease)   . Glaucoma   . Hiatal hernia   . HOH (hard of hearing)    bilateral even w/ hearing aids  . Hyperlipidemia   . Hypertension   . Kidney tumor    left side  . Legally blind in right eye, as defined in [place]    due to macular degeneration  . Macular degeneration   . Nausea    intermittant due to colon mass  . Ovarian cyst   . Wears glasses   . Wears hearing aid    bilateral    SURGICAL HISTORY: Past Surgical History:  Procedure Laterality Date  . BREAST CYST EXCISION  1990   benign  . COLONOSCOPY  09/04/2016  . CYSTOSCOPY WITH RETROGRADE PYELOGRAM, URETEROSCOPY AND STENT PLACEMENT Left 10/10/2016   Procedure: CYSTOSCOPY WITH RETROGRADE PYELOGRAM, DIAGNOSTIC URETEROSCOPY  AND STENT PLACEMENT;  Surgeon: Alexis Frock, MD;  [facility];  Service: Urology;  Laterality: Left;  . ESOPHAGOGASTRODUODENOSCOPY  11/07/2014  . NASAL SINUS SURGERY    . TONSILLECTOMY      SOCIAL HISTORY: Social History   Social History  . Marital status: Unknown    Spouse name: N/A  . Number of children: N/A  . Years of education: N/A   Occupational History  . Not on file.    Social History Main Topics  . Smoking status: Never Smoker  . Smokeless tobacco: Never Used  . Alcohol use No  . Drug use: No  . Sexual activity: Not on file   Other Topics Concern  . Not on file   Social History Narrative  . No narrative on file    FAMILY HISTORY: Family History  Problem Relation Age of Onset  . Cancer Sister 25    breast cancer   . Cancer Brother 25    lung cancer   . Diabetes Son     ALLERGIES:  is allergic to tramadol and bactrim [sulfamethoxazole-trimethoprim].  MEDICATIONS:  Current Outpatient Prescriptions  Medication Sig Dispense Refill  . alendronate (FOSAMAX) 70 MG tablet Take 70 mg by mouth once a week. Take with a full glass of water on an empty stomach.    Marland Kitchen amLODipine (NORVASC) 5 MG tablet Take 5 mg by mouth daily.    Marland Kitchen atorvastatin (LIPITOR) 10 MG tablet Take 10 mg by mouth daily.    . benazepril-hydrochlorthiazide (LOTENSIN HCT) 20-12.5 MG tablet Take 1 tablet by mouth daily.    Marland Kitchen  Cholecalciferol (VITAMIN D3) 5000 units CAPS Take 1 capsule by mouth daily.    Marland Kitchen escitalopram (LEXAPRO) 10 MG tablet Take 10 mg by mouth daily.    . fexofenadine (ALLEGRA) 180 MG tablet Take 180 mg by mouth daily.    Marland Kitchen latanoprost (XALATAN) 0.005 % ophthalmic solution Place 1 drop into both eyes at bedtime.    . Multiple Vitamins-Minerals (VITEYES AREDS ADVANCED) CAPS Take 1 capsule by mouth daily.    Marland Kitchen omeprazole (PRILOSEC) 40 MG capsule Take 40 mg by mouth daily.    Vladimir Faster Glycol-Propyl Glycol (SYSTANE) 0.4-0.3 % SOLN Apply to eye as needed.     No current facility-administered medications for this visit.     REVIEW OF SYSTEMS:   Constitutional: Denies fevers, chills or abnormal night sweats Eyes: Denies blurriness of vision, double vision or watery eyes Ears, nose, mouth, throat, and face: Denies mucositis or sore throat Respiratory: Denies cough, dyspnea or wheezes Cardiovascular: Denies palpitation, chest discomfort or lower extremity  swelling Gastrointestinal:  Denies nausea, heartburn or change in bowel habits (+) abdominal pain/cramping  GU: (+) left flank pain Skin: Denies abnormal skin rashes Lymphatics: Denies new lymphadenopathy or easy bruising Neurological:Denies numbness, tingling or new weaknesses Behavioral/Psych: Mood is stable, no new changes  All other systems were reviewed with the patient and are negative.  PHYSICAL EXAMINATION: ECOG PERFORMANCE STATUS: 1 - Symptomatic but completely ambulatory  Vitals:   10/25/16 1552  BP: (!) 144/71  Pulse: 89  Resp: 16  Temp: 97.1 F (36.2 C)   Filed Weights   10/25/16 1552  Weight: 179 lb 4.8 oz (81.3 kg)   GENERAL:alert, no distress and comfortable SKIN: skin color, texture, turgor are normal, no rashes or significant lesions EYES: normal, conjunctiva are pink and non-injected, sclera clear OROPHARYNX:no exudate, no erythema and lips, buccal mucosa, and tongue normal  NECK: supple, thyroid normal size, non-tender, without nodularity LYMPH:  no palpable lymphadenopathy in the cervical, axillary or inguinal LUNGS: clear to auscultation and percussion with normal breathing effort HEART: regular rate & rhythm and no murmurs and no lower extremity edema ABDOMEN:abdomen soft, non-tender and normal bowel sounds Musculoskeletal:no cyanosis of digits and no clubbing  PSYCH: alert & oriented x 3 with fluent speech NEURO: no focal motor/sensory deficits  LABORATORY DATA:  I have reviewed the data as listed CBC Latest Ref Rng & Units 10/10/2016  Hemoglobin 12.0 - 15.0 g/dL 13.9  Hematocrit 36.0 - 46.0 % 41.0   CMP Latest Ref Rng & Units 10/10/2016  Glucose 65 - 99 mg/dL 99  BUN 6 - 20 mg/dL 11  Creatinine 0.44 - 1.00 mg/dL 0.70  Sodium 135 - 145 mmol/L 143  Potassium 3.5 - 5.1 mmol/L 4.0  Chloride 101 - 111 mmol/L 103   PATHOLOGY:  Diagnosis 09/04/2016 MODERATELY DIFFERENTIATED ADENOCARCINOMA OF COLON AT 20 CM  Tumor Site: Colon at 20 cm Tumor Type:  Adenocarcinoma Tumore Size: Unknown  Grade: Moderately differentiated   RADIOGRAPHIC STUDIES: I have personally reviewed the radiological images as listed and agreed with the findings in the report.  MRI Abdomen Pelvis w/wo Contrast 10/15/2016 IMPRESSION: 1. There are 2 enhancing hepatic lesions highly worrisome for metastatic disease, likely metastatic colon cancer. 2. The lesion of concern in the anterior suprahilar lip of the left kidney is also enhancing, worrisome for neoplasm. This has a somewhat atypical appearance for renal cell carcinoma and could reflect a metastasis as well. 3. New left-sided hydroureter with delayed contrast excretion consistent with distal ureteral obstruction. Specific  etiology uncertain. 4. Complex cystic and solid left adnexal lesion could reflect cystic neoplasm, although does not appear aggressive or appear to reflect the cause of the distal left ureteral obstruction. 5. Sigmoid colon wall thickening could be related to reported newly diagnosed colon cancer.  CT CAP w Contrast 09/06/2016 (done outside) IMPRESSION: 1. No acute abdominopelvic process.  2. Heterogenous hypodense area involving the medial edge of the left kidney with multiple small soft tissue like stranding involving the left pararenal space. Findings are suspicious for malignancy of the left kidney with small pararenal metastases. MRI abdomen with contrast should be considered to further evaluate.  3. Vague hypodensity involving the lateral segment of the liver. This can also be reevaluated with MRI.  4. Partially visualized 5 mm posterior right lower lobe nodular density. CT chest without contrast follow up is recommended.  5. Complex multilobular left ovarian lesion may reflect a cluster of cysts.   Colonoscopy 09/02/2016 FINDINGS: Diverticulosis (scattered). Pt has about 10 cm structure starting at 20 cm with abnormal mucosa. The colonoscopy scope was not able to advance due to  structure, and upper endoscopy scope was used and it was advanced through the stricture to see ileocecal valve. Biopsies at the stricture were taken.   ASSESSMENT & PLAN:  Wyvonne is a 83 y.o. female who presents for:  1. Adenocarcinoma of left colon, sigmoid colon, TxNxM1, probably stage IV  -I discussed the imaging, colonoscopy and biopsy results with the patient and her family in detail.  -I also reviewed her images with patient in person. She has 2 lesions in the liver, which are very suspicious for metastatic disease. -I recommend her to have a PET scan for staging purposes, she agrees. -She also has left kidney lesion, and complex multilobular left ovarian lesion, possible neoplastic, although less likely metastatic lesion from colon cancer. -If her liver lesions are positive on the PET scan, I think she likely has metastatic disease. I will ask IR to biopsy her left lobe liver lesion, to confirm metastasis and it's origin from her colon cancer. -she does not appear to have significant obstruction or bleeding from her sigmoid colon cancer. If she has metastatic disease, upfront surgery may not be indicated.  -She has seen Drs. Manny and Gross, surgical resections were discussed.  -I would recommended low intensity chemotherapy if she does have metastatic disese. Due to her advanced age, she may not be able to tolerate very intensive chemotherapy.  -She takes MiraLax to help her bowel movements. She also changed her diet and takes fiber supplements. Her abdominal pain and constipation has improved lately.  2. Left renal mass and left hydronephrosis -Possible renal cell carcinoma, metastatic lesion from colon cancer is also a possibility. -she has been seen by GU Dr. Tresa Moore and had left ureter stent placement on 10/10/2016.  3. Complex cyst of left ovary -will monitor.  -If she undergo abdominal surgery, may remove it   4. Atrial Fibrillation -She is seeing her cardiologist on 10/31/2016 at  9:15 am  5. HTN -Continue medications and follow up with primary care physician  Plan -Ordered PET scan. Pt requests 10/31/2016, after her cardiology appointment at 9:15 -Return for follow up on March 27th or 28th to review her PET scan results.  -will present her case in GI conference next week    All questions were answered. The patient knows to call the clinic with any problems, questions or concerns.  I spent 55 minutes counseling the patient face to face. The  total time spent in the appointment was 60 minutes and more than 50% was on counseling.  This document serves as a record of services personally performed by Truitt Merle, MD. It was created on her behalf by Martinique Casey, a trained medical scribe. The creation of this record is based on the scribe's personal observations and the provider's statements to them. This document has been checked and approved by the attending provider.  I have reviewed the above documentation for accuracy and completeness and I agree with the above.   Truitt Merle, MD 10/25/2016 10:09 PM

## 2016-10-25 NOTE — Telephone Encounter (Signed)
Gave patient AVS and calender per 3/16 los. [facility] to contact patient with PET scan schedule

## 2016-10-25 NOTE — Telephone Encounter (Signed)
10/25/2016 Received faxed referral from [facility] for upcoming appointment with Dr. Oval Linsey on 10/31/2016. Records given to [facility] .  cbr

## 2016-10-28 ENCOUNTER — Telehealth: Payer: Self-pay | Admitting: Hematology

## 2016-10-28 NOTE — Telephone Encounter (Signed)
Needs to change appointment for 3/28 after her pet scan   Call back is [phone] or [phone]

## 2016-10-28 NOTE — Telephone Encounter (Signed)
lvm to inform pt of r/s MD appt to 3/28 at 145 pm per telephone encounter

## 2016-10-31 ENCOUNTER — Other Ambulatory Visit: Payer: Self-pay | Admitting: Hematology

## 2016-10-31 ENCOUNTER — Ambulatory Visit (INDEPENDENT_AMBULATORY_CARE_PROVIDER_SITE_OTHER): Payer: Medicare Other | Admitting: Cardiovascular Disease

## 2016-10-31 ENCOUNTER — Encounter: Payer: Self-pay | Admitting: Cardiovascular Disease

## 2016-10-31 ENCOUNTER — Other Ambulatory Visit (HOSPITAL_BASED_OUTPATIENT_CLINIC_OR_DEPARTMENT_OTHER): Payer: Medicare Other

## 2016-10-31 ENCOUNTER — Other Ambulatory Visit: Payer: Self-pay | Admitting: *Deleted

## 2016-10-31 VITALS — BP 123/82 | HR 105 | Ht 64.0 in | Wt 176.0 lb

## 2016-10-31 DIAGNOSIS — E7800 Pure hypercholesterolemia, unspecified: Secondary | ICD-10-CM | POA: Diagnosis not present

## 2016-10-31 DIAGNOSIS — I4891 Unspecified atrial fibrillation: Secondary | ICD-10-CM | POA: Diagnosis not present

## 2016-10-31 DIAGNOSIS — C186 Malignant neoplasm of descending colon: Secondary | ICD-10-CM

## 2016-10-31 DIAGNOSIS — E785 Hyperlipidemia, unspecified: Secondary | ICD-10-CM | POA: Insufficient documentation

## 2016-10-31 DIAGNOSIS — I4819 Other persistent atrial fibrillation: Secondary | ICD-10-CM

## 2016-10-31 DIAGNOSIS — I10 Essential (primary) hypertension: Secondary | ICD-10-CM | POA: Diagnosis not present

## 2016-10-31 DIAGNOSIS — I481 Persistent atrial fibrillation: Secondary | ICD-10-CM | POA: Diagnosis not present

## 2016-10-31 HISTORY — DX: Essential (primary) hypertension: I10

## 2016-10-31 HISTORY — DX: Other persistent atrial fibrillation: I48.19

## 2016-10-31 LAB — CBC WITH DIFFERENTIAL/PLATELET
BASOS ABS: 0 {cells}/uL (ref 0–200)
Basophils Relative: 0 %
EOS PCT: 2 %
Eosinophils Absolute: 186 cells/uL (ref 15–500)
HCT: 37.9 % (ref 35.0–45.0)
HEMOGLOBIN: 12.3 g/dL (ref 11.7–15.5)
LYMPHS ABS: 1488 {cells}/uL (ref 850–3900)
Lymphocytes Relative: 16 %
MCH: 29.3 pg (ref 27.0–33.0)
MCHC: 32.5 g/dL (ref 32.0–36.0)
MCV: 90.2 fL (ref 80.0–100.0)
MPV: 10 fL (ref 7.5–12.5)
Monocytes Absolute: 837 cells/uL (ref 200–950)
Monocytes Relative: 9 %
NEUTROS ABS: 6789 {cells}/uL (ref 1500–7800)
Neutrophils Relative %: 73 %
Platelets: 399 10*3/uL (ref 140–400)
RBC: 4.2 MIL/uL (ref 3.80–5.10)
RDW: 14.5 % (ref 11.0–15.0)
WBC: 9.3 10*3/uL (ref 3.8–10.8)

## 2016-10-31 LAB — TSH: TSH: 1.82 m[IU]/L

## 2016-10-31 LAB — T4, FREE: Free T4: 1.1 ng/dL (ref 0.8–1.8)

## 2016-10-31 LAB — COMPREHENSIVE METABOLIC PANEL
ALBUMIN: 3.7 g/dL (ref 3.5–5.0)
ALT: 15 U/L (ref 0–55)
AST: 16 U/L (ref 5–34)
Alkaline Phosphatase: 94 U/L (ref 40–150)
Anion Gap: 9 mEq/L (ref 3–11)
BUN: 15.9 mg/dL (ref 7.0–26.0)
CALCIUM: 9.9 mg/dL (ref 8.4–10.4)
CHLORIDE: 105 meq/L (ref 98–109)
CO2: 28 mEq/L (ref 22–29)
CREATININE: 0.9 mg/dL (ref 0.6–1.1)
EGFR: 64 mL/min/{1.73_m2} — ABNORMAL LOW (ref >90)
GLUCOSE: 99 mg/dL (ref 70–140)
Potassium: 4.6 mEq/L (ref 3.5–5.1)
Sodium: 142 mEq/L (ref 136–145)
TOTAL PROTEIN: 7.6 g/dL (ref 6.4–8.3)
Total Bilirubin: 0.6 mg/dL (ref 0.20–1.20)

## 2016-10-31 LAB — CEA (IN HOUSE-CHCC): CEA (CHCC-IN HOUSE): 12.72 ng/mL — AB (ref 0.00–5.00)

## 2016-10-31 MED ORDER — DILTIAZEM HCL ER COATED BEADS 120 MG PO CP24: 120.0000 mg | ORAL_CAPSULE | Freq: Every day | ORAL | 5 refills | Status: DC

## 2016-10-31 NOTE — Progress Notes (Signed)
Cardiology Office Note   Date:  10/31/2016   ID:  Mary [name], DOB 02/07/1933, MRN [id number]  PCP:  Moshe Cipro, MD  Cardiologist:   Skeet Latch, MD  Surgeon:  Michael Boston, MD Medical Oncologist: Truitt Merle, MD Gastroenterologist: Toma Deiters, MD Urology: Alexis Frock, MD  Chief Complaint  Patient presents with  . New Evaluation    pt to have surgery, states she had arrythmia while having a procedure      History of Present Illness: Mary [name] is a 83 y.o. female with paroxysmal atrial fibrillation, hypertension, hyperlipidemia, recently diagnosed colon cancer, and macular degeneration, who presents for management of atrial fibrillation and pre-surgical risk assessment.  Mary [name] was diagnosed with adenocarcinoma of the sigmoid colon 08/2016. It is thought to be likely stage IV.  It is unclear whether she will undergo resection and/or chemotherapy. She may undergo left nephrectomy with sigmoid colectomy.  She had significant obstructive symptoms but changed her diet and is no longer constipated. She underwent cystoscopy and ureteroscopy with insertion of a L ureteral stent on 10/10/16 to better evaluate her kidney lesions.  At that time she was noted to be in atrial fibrillation. She has not experienced any palpitations. She notes feeling generally weak but is able to complete her household chores. She lives independently and does all of her ADLs. Her daughter-in-law notes that she has been somewhat unsteady on her feet but has not fallen. She denies chest pain but does have some shortness of breath with extreme exertion. She was on a very steep hill and sometimes feel short of breath after walking to the top. She is able to walk one flight of stairs or walk greater than 4 blocks on flat land.  She denies lower extremity edema, orthopnea, or PND. She occasionally has lightheadedness or dizziness but attributes this to not drinking well. She denies melena or hematochezia.      Past Medical History:  Diagnosis Date  . Colon cancer [facility])    dx via biospy from colonoscopy-- malignant ---  currently having work-up done w/ dr gross (general surgeon) and coordinate surgery w/ urologsit for renal tumor  . Essential hypertension 10/31/2016  . GERD (gastroesophageal reflux disease)   . Glaucoma   . Hiatal hernia   . HOH (hard of hearing)    bilateral even w/ hearing aids  . Hyperlipidemia   . Hypertension   . Kidney tumor    left side  . Legally blind in right eye, as defined in [place]    due to macular degeneration  . Macular degeneration   . Nausea    intermittant due to colon mass  . Ovarian cyst   . Persistent atrial fibrillation ([place]) 10/31/2016  . Wears glasses   . Wears hearing aid    bilateral    Past Surgical History:  Procedure Laterality Date  . BREAST CYST EXCISION  1990   benign  . COLONOSCOPY  09/04/2016  . CYSTOSCOPY WITH RETROGRADE PYELOGRAM, URETEROSCOPY AND STENT PLACEMENT Left 10/10/2016   Procedure: CYSTOSCOPY WITH RETROGRADE PYELOGRAM, DIAGNOSTIC URETEROSCOPY  AND STENT PLACEMENT;  Surgeon: Alexis Frock, MD;  Location: [facility];  Service: Urology;  Laterality: Left;  . ESOPHAGOGASTRODUODENOSCOPY  11/07/2014  . NASAL SINUS SURGERY    . TONSILLECTOMY       Current Outpatient Prescriptions  Medication Sig Dispense Refill  . alendronate (FOSAMAX) 70 MG tablet Take 70 mg by mouth once a week. Take with a full glass of water  on an empty stomach.    Marland Kitchen amLODipine (NORVASC) 5 MG tablet Take 5 mg by mouth daily.    Marland Kitchen aspirin EC 81 MG tablet Take 81 mg by mouth daily.    Marland Kitchen atorvastatin (LIPITOR) 10 MG tablet Take 10 mg by mouth daily.    . benazepril-hydrochlorthiazide (LOTENSIN HCT) 20-12.5 MG tablet Take 1 tablet by mouth daily.    . Cholecalciferol (VITAMIN D3) 5000 units CAPS Take 1 capsule by mouth daily.    Marland Kitchen escitalopram (LEXAPRO) 10 MG tablet Take 10 mg by mouth daily.    . fexofenadine (ALLEGRA) 180 MG tablet  Take 180 mg by mouth daily.    Marland Kitchen latanoprost (XALATAN) 0.005 % ophthalmic solution Place 1 drop into both eyes at bedtime.    . Multiple Vitamins-Minerals (VITEYES AREDS ADVANCED) CAPS Take 1 capsule by mouth daily.    Marland Kitchen omeprazole (PRILOSEC) 40 MG capsule Take 40 mg by mouth daily.    Vladimir Faster Glycol-Propyl Glycol (SYSTANE) 0.4-0.3 % SOLN Apply to eye as needed.    . diltiazem (CARDIZEM CD) 120 MG 24 hr capsule Take 1 capsule (120 mg total) by mouth daily. 30 capsule 5   No current facility-administered medications for this visit.     Allergies:   Tramadol and Bactrim [sulfamethoxazole-trimethoprim]    Social History:  The patient  reports that she has never smoked. She has never used smokeless tobacco. She reports that she does not drink alcohol or use drugs.   Family History:  The patient's family history includes Cancer (age of onset: 48) in her brother; Cancer (age of onset: 66) in her sister; Diabetes in her son; High blood pressure in her mother and son; Peripheral Artery Disease in her mother.    ROS:  Please see the history of present illness.   Otherwise, review of systems are positive for none.   All other systems are reviewed and negative.    PHYSICAL EXAM: VS:  BP 123/82 (BP Location: Left Arm, Patient Position: Sitting, Cuff Size: Normal)   Pulse (!) 105   Ht 5\' 4"  (1.626 m)   Wt 79.8 kg (176 lb)   BMI 30.21 kg/m  , BMI Body mass index is 30.21 kg/m. GENERAL:  Well appearing HEENT:  Pupils equal round and reactive, fundi not visualized, oral mucosa unremarkable NECK:  No jugular venous distention, waveform within normal limits, carotid upstroke brisk and symmetric, no bruits, no thyromegaly LYMPHATICS:  No cervical adenopathy LUNGS:  Clear to auscultation bilaterally HEART:  RRR.  PMI not displaced or sustained,S1 and S2 within normal limits, no S3, no S4, no clicks, no rubs, no murmurs ABD:  Flat, positive bowel sounds normal in frequency in pitch, no bruits, no  rebound, no guarding, no midline pulsatile mass, no hepatomegaly, no splenomegaly EXT:  2 plus pulses throughout, no edema, no cyanosis no clubbing SKIN:  No rashes no nodules NEURO:  Cranial nerves II through XII grossly intact, motor grossly intact throughout PSYCH:  Cognitively intact, oriented to person place and time   EKG:  EKG is ordered today. The ekg ordered today demonstrates atrial fibrillation.  Rate 105 bpm. LAFB. Poor R wave progression   Recent Labs: 10/10/2016: Hemoglobin 13.9 10/31/2016: ALT 15; BUN 15.9; Creatinine 0.9; Potassium 4.6; Sodium 142    Lipid Panel No results found for: CHOL, TRIG, HDL, CHOLHDL, VLDL, LDLCALC, LDLDIRECT    Wt Readings from Last 3 Encounters:  10/31/16 79.8 kg (176 lb)  10/25/16 81.3 kg (179 lb 4.8 oz)  10/10/16 80.3 kg (177 lb)      ASSESSMENT AND PLAN:  # Persistent atrial fibrillation: Mary [name] remains in atrial fibrillation.  It is unclear how long she has been in atrial fibrillation.  She seems relatively asymptomatic.  She is fatigued but its unclear if this is due to afib or malignancy.  Her heart rate is poorly controlled. We will start diltiazem 120 mg daily. Given that she may have surgery in the near future and the fact that she has metastatic colon cancer at risk of bleeding, we will not start chronic anticoagulation at this time. We will reassess once her surgical plan is more clear. For now we will just start aspirin 81 mg daily.  Check TSH/fT4.  # Pre-operative risk assessment: The patient does not have any unstable cardiac conditions.  Upon evaluation today, she can achieve 4 METs or greater without anginal symptoms.  According to [facility] and AHA guidelines, she requires no further cardiac workup prior to her noncardiac surgery and should be at acceptable risk.  her NSQIP risk of peri-procedural MI or cardiac arrest is 3%.  Our service is available as necessary in the perioperative period if this is selected over  chemotherapy.  # Hypertension:  Blood pressure only mildly elevated.  Continue amlodipine, benazepril, HCTZ.  Adding diltiazem as above.    # Hyperlipidemia: Continue atorvastatin.  Current medicines are reviewed at length with the patient today.  The patient does not have concerns regarding medicines.  The following changes have been made:  no change  Labs/ tests ordered today include:   Orders Placed This Encounter  Procedures  . T4, free  . TSH  . CBC with Differential/Platelet  . EKG 12-Lead  . ECHOCARDIOGRAM COMPLETE     Disposition:   FU with Orian Amberg C. Oval Linsey, MD, [facility] in 1 month.   This note was written with the assistance of speech recognition software.  Please excuse any transcriptional errors.  Signed, Lasheba Stevens C. Oval Linsey, MD, [facility]  10/31/2016 4:17 PM    [facility]

## 2016-10-31 NOTE — Patient Instructions (Addendum)
Medication Instructions:  START ASPIRIN 81 MG DAILY  START DILTIAZEM 120 MG DAILY   Labwork: TSH/FT4/CBC AT [facility] ON THERE FIRST FLOOR TODAY  Testing/Procedures: Your physician has requested that you have an echocardiogram. Echocardiography is a painless test that uses sound waves to create images of your heart. It provides your doctor with information about the size and shape of your heart and how well your heart's chambers and valves are working. This procedure takes approximately one hour. There are no restrictions for this procedure. [address]  Follow-Up: Your physician recommends that you schedule a follow-up appointment in: [facility]  If you need a refill on your cardiac medications before your next appointment, please call your pharmacy.   Atrial Fibrillation Atrial fibrillation is a type of heartbeat that is irregular or fast (rapid). If you have this condition, your heart keeps quivering in a weird (chaotic) way. This condition can make it so your heart cannot pump blood normally. Having this condition gives a person more risk for stroke, heart failure, and other heart problems. There are different types of atrial fibrillation. Talk with your doctor to learn about the type that you have. Follow these instructions at home:  Take over-the-counter and prescription medicines only as told by your doctor.  If your doctor prescribed a blood-thinning medicine, take it exactly as told. Taking too much of it can cause bleeding. If you do not take enough of it, you will not have the protection that you need against stroke and other problems.  Do not use any tobacco products. These include cigarettes, chewing tobacco, and e-cigarettes. If you need help quitting, ask your doctor.  If you have apnea (obstructive sleep apnea), manage it as told by your doctor.  Do not drink alcohol.  Do not drink beverages that have caffeine. These include coffee,  soda, and tea.  Maintain a healthy weight. Do not use diet pills unless your doctor says they are safe for you. Diet pills may make heart problems worse.  Follow diet instructions as told by your doctor.  Exercise regularly as told by your doctor.  Keep all follow-up visits as told by your doctor. This is important. Contact a doctor if:  You notice a change in the speed, rhythm, or strength of your heartbeat.  You are taking a blood-thinning medicine and you notice more bruising.  You get tired more easily when you move or exercise. Get help right away if:  You have pain in your chest or your belly (abdomen).  You have sweating or weakness.  You feel sick to your stomach (nauseous).  You notice blood in your throw up (vomit), poop (stool), or pee (urine).  You are short of breath.  You suddenly have swollen feet and ankles.  You feel dizzy.  Your suddenly get weak or numb in your face, arms, or legs, especially if it happens on one side of your body.  You have trouble talking, trouble understanding, or both.  Your face or your eyelid droops on one side. These symptoms may be an emergency. Do not wait to see if the symptoms will go away. Get medical help right away. Call your local emergency services (911 in the [place] Do not drive yourself to the hospital. This information is not intended to replace advice given to you by your health care provider. Make sure you discuss any questions you have with your health care provider. Document Released: 05/07/2008 Document Revised: 01/04/2016  Document Reviewed: 11/23/2014 Elsevier Interactive Patient Education  2017 [employer].  Echocardiogram An echocardiogram, or echocardiography, uses sound waves (ultrasound) to produce an image of your heart. The echocardiogram is simple, painless, obtained within a short period of time, and offers valuable information to your health care provider. The images from an echocardiogram can provide  information such as:  Evidence of coronary artery disease (CAD).  Heart size.  Heart muscle function.  Heart valve function.  Aneurysm detection.  Evidence of a past heart attack.  Fluid buildup around the heart.  Heart muscle thickening.  Assess heart valve function. Tell a health care provider about:  Any allergies you have.  All medicines you are taking, including vitamins, herbs, eye drops, creams, and over-the-counter medicines.  Any problems you or family members have had with anesthetic medicines.  Any blood disorders you have.  Any surgeries you have had.  Any medical conditions you have.  Whether you are pregnant or may be pregnant. What happens before the procedure? No special preparation is needed. Eat and drink normally. What happens during the procedure?  In order to produce an image of your heart, gel will be applied to your chest and a wand-like tool (transducer) will be moved over your chest. The gel will help transmit the sound waves from the transducer. The sound waves will harmlessly bounce off your heart to allow the heart images to be captured in real-time motion. These images will then be recorded.  You may need an IV to receive a medicine that improves the quality of the pictures. What happens after the procedure? You may return to your normal schedule including diet, activities, and medicines, unless your health care provider tells you otherwise. This information is not intended to replace advice given to you by your health care provider. Make sure you discuss any questions you have with your health care provider. Document Released: 07/26/2000 Document Revised: 03/16/2016 Document Reviewed: 04/05/2013 Elsevier Interactive Patient Education  2017 [employer].

## 2016-11-01 ENCOUNTER — Telehealth: Payer: Self-pay | Admitting: *Deleted

## 2016-11-01 ENCOUNTER — Telehealth: Payer: Self-pay | Admitting: Hematology

## 2016-11-01 NOTE — Telephone Encounter (Signed)
Received vm call from [name] stating that pt's liver Biopsy is scheduled for 11/11/16 & appt with Dr Burr Medico is 11/13/16 & wants to know if this needs to be moved.  She wants to know if this is enough time for Dr Burr Medico to get results.  Message to Dr Burr Medico.

## 2016-11-01 NOTE — Telephone Encounter (Signed)
Patient daughter in law - Mary [name] , aware of new date and time of appt.

## 2016-11-01 NOTE — Telephone Encounter (Signed)
Called & spoke to [facility] & informed per Dr Burr Medico that there is no need to move appts.  She was very appreciative of call.

## 2016-11-04 ENCOUNTER — Other Ambulatory Visit: Payer: Self-pay

## 2016-11-04 ENCOUNTER — Other Ambulatory Visit (HOSPITAL_COMMUNITY): Payer: Medicare Other

## 2016-11-04 ENCOUNTER — Ambulatory Visit (HOSPITAL_COMMUNITY): Payer: Medicare Other | Attending: Cardiology

## 2016-11-04 DIAGNOSIS — I340 Nonrheumatic mitral (valve) insufficiency: Secondary | ICD-10-CM | POA: Diagnosis not present

## 2016-11-04 DIAGNOSIS — I4891 Unspecified atrial fibrillation: Secondary | ICD-10-CM

## 2016-11-04 DIAGNOSIS — I272 Pulmonary hypertension, unspecified: Secondary | ICD-10-CM | POA: Diagnosis not present

## 2016-11-04 NOTE — Progress Notes (Signed)
[place]  [phone] [phone]  Clinic Follow Up Note   Patient Care Team: Moshe Cipro, MD as PCP - General (Internal Medicine) Alexis Frock, MD as Consulting Physician (Urology) Michael Boston, MD as Consulting Physician (General Surgery) Toma Deiters, MD as Referring Physician (Gastroenterology) 11/13/2016  CHIEF COMPLAINTS:  Sigmoid colon Adenocarcinoma  Oncology History   Cancer Staging Cancer of left colon [facility]) Staging form: Colon and Rectum, AJCC 8th Edition - Clinical stage from 09/04/2016: Stage IVB (cTX, cNX, pM1b) - Signed by Truitt Merle, MD on 11/12/2016       Cancer of left colon ([place])   09/02/2016 Procedure    Colonoscopy Diverticulosis (scattered). Pt has about 10 cm structure starting at 20 cm with abnormal mucosa. Biopsies taken.       09/02/2016 Pathology Results    MODERATELY DIFFERENTIATED ADENOCARCINOMA OF COLON AT 20 CM      09/04/2016 Initial Diagnosis    Cancer of left colon ([place])      09/06/2016 Imaging    CT CAP w Contrast 1. No acute abdominopelvic process.  2. Heterogenous hypodense area involving the medial edge of the left kidney with multiple small soft tissue like stranding involving the left pararenal space. Findings are suspicious for malignancy of the left kidney with small pararenal metastases. MRI abdomen with contrast should be considered to further evaluate.  3. Vague hypodensity involving the lateral segment of the liver. This can also be reevaluated with MRI.  4. Partially visualized 5 mm posterior right lower lobe nodular density. CT chest without contrast follow up is recommended.  5. Complex multilobular left ovarian lesion may reflect a cluster of cysts.      10/10/2016 Procedure    Operative Report by Dr. Tresa Moore on 10/18/16 Procedure: 1) Cystoscopy, left retrograde pyelogram interpretation. 2) Left diagnostic uteroscopy. 3) Insertion of left uteral stent; 5 x 24 Polaris with  tether. Findings: 1) Unremarkable bladder. 2) Unremarkable left retrograde pyelogram. 3) No evidence of intraluminal urothelial neoplasm whatsoever with insepction of the left kidney and ureter times several. 4) Successful placement of left ureteral stent, proximal in renal pelvis and distal in urinary bladder.      10/15/2016 Imaging    MRI abdomen pelvis 1. There are 2 enhancing hepatic lesions highly worrisome for metastatic disease, likely metastatic colon cancer. 2. The lesion of concern in the anterior suprahilar lip of the left kidney is also enhancing, worrisome for neoplasm. This has a somewhat atypical appearance for renal cell carcinoma and could reflect a metastasis as well. 3. New left-sided hydroureter with delayed contrast excretion consistent with distal ureteral obstruction. Specific etiology uncertain. 4. Complex cystic and solid left adnexal lesion could reflect cystic neoplasm, although does not appear aggressive or appear to reflect the cause of the distal left ureteral obstruction. 5. Sigmoid colon wall thickening could be related to reported newly diagnosed colon cancer.      11/06/2016 PET scan    PET 11/06/2016 IMPRESSION: 1. Hypermetabolic pulmonary and hepatic lesions, most consistent with metastatic colon cancer, with the hypermetabolic primary located in the sigmoid colon. 2. Left renal and left adnexal lesions, better seen and evaluated on 10/15/2016. 3. Persistent moderate left hydronephrosis. 4. Aortic atherosclerosis (ICD10-170.0).      11/11/2016 Pathology Results    Liver, needle/core biopsy, left lobe - METASTATIC ADENOCARCINOMA. The histologic features are consistent with a primary gastrointestinal adenocarcinoma.      HISTORY OF PRESENTING ILLNESS (10/25/2016):  Mary [name] 83 y.o. female is here  because of adenocarcinoma of the colon and a left renal mass. The pt noticed worsening episodes of abdominal cramping and nausea with bowel  movements in July 2017. She had an abdominal US performed, which was unremarkable. A colonoscopy was then performed at the patient's request, which found a mass about 20 cm from the anal verge. Biopsy showed moderately differentiated adenocarcinoma. MRI done, with a lesion noticed in the left kidney near the pelvis, a 11 mm hepatic lesion, 4 mm lung nodule, and possible cystic left ovarian mass. Pt saw Dr. Johney Maine for information on abdominal surgery for her colon, which she declined. She also saw Dr. Lorna Dibble, who is considering a nephrectomy, but is planning a sternoscopy to better characterize the kidney lesion. She presents today for continued treatment.    She presents today with her son and daughter-in-law. The symptoms started in late July 2017. She had some vomiting, abdominal pain, and constipation, but not bleeding. Her PCP never found any anemia. Initially she lost her appetite, but now she is taking multiple vitamins and supplements, so her appetite is back. Her bowel has improved, but she still has abdominal pain that comes and goes, lasting minutes. The pain is tolerable with aleve. She did not tolerate Tramadol well; nausea and vomiting. She has some occasional left sides back pain. Denies weight loss, or any other concerns. She is able to do everything she needs to around the house.   She had a partial hysterectomy, and a lumpectomy. She has a history of HTN, atrial fibrillation, high cholesterol. She has some hearing loss and can not see out of her right eye. Not a diabetic, but her son and her mother are. No family history of colon cancer. Her sister had breast cancer in her 26's and her brother had lung cancer in his 99's. Never smoker. Doesn't drink alcohol. 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 She has two sons; one lives an hour away, and the other lives in [place]. She lives alone.   CURRENT THERAPY: Pending   INTERVAL HISTORY: Mary [name] returns for follow up and discuss test  results with family. She reports 3-4 small bowel movements a day. Reports left abdominal pain that she describes as cramps. The pain lasts a couple of minutes. She takes Aleve and it helps. To stay regular, she takes Miralax and drinks prune juice.  MEDICAL HISTORY:  Past Medical History:  Diagnosis Date  . Colon cancer [facility])    dx via biospy from colonoscopy-- malignant ---  currently having work-up done w/ dr gross (general surgeon) and coordinate surgery w/ urologsit for renal tumor  . Dysrhythmia    A  Fib  . Essential hypertension 10/31/2016  . GERD (gastroesophageal reflux disease)   . Glaucoma   . Hiatal hernia   . HOH (hard of hearing)    bilateral even w/ hearing aids  . Hyperlipidemia   . Hypertension   . Kidney tumor    left side  . Legally blind in right eye, as defined in [place]    due to macular degeneration  . Macular degeneration   . Nausea    intermittant due to colon mass  . Ovarian cyst   . Persistent atrial fibrillation ([place]) 10/31/2016  . PONV (postoperative nausea and vomiting)   . Wears glasses   . Wears hearing aid    bilateral    SURGICAL HISTORY: Past Surgical History:  Procedure Laterality Date  . BREAST CYST EXCISION  1990   benign  . COLONOSCOPY  09/04/2016  . CYSTOSCOPY WITH RETROGRADE PYELOGRAM, URETEROSCOPY AND STENT PLACEMENT Left 10/10/2016   Procedure: CYSTOSCOPY WITH RETROGRADE PYELOGRAM, DIAGNOSTIC URETEROSCOPY  AND STENT PLACEMENT;  Surgeon: Alexis Frock, MD;  Location: [facility];  Service: Urology;  Laterality: Left;  . ESOPHAGOGASTRODUODENOSCOPY  11/07/2014  . NASAL SINUS SURGERY    . TONSILLECTOMY      SOCIAL HISTORY: Social History   Social History  . Marital status: Unknown    Spouse name: N/A  . Number of children: N/A  . Years of education: N/A   Occupational History  . Not on file.   Social History Main Topics  . Smoking status: Never Smoker  . Smokeless tobacco: Never Used  . Alcohol use No  . Drug  use: No  . Sexual activity: Not on file   Other Topics Concern  . Not on file   Social History Narrative  . No narrative on file    FAMILY HISTORY: Family History  Problem Relation Age of Onset  . Cancer Sister 74    breast cancer   . Cancer Brother 81    lung cancer   . Diabetes Son   . High blood pressure Son   . Peripheral Artery Disease Mother   . High blood pressure Mother     ALLERGIES:  is allergic to tramadol and bactrim [sulfamethoxazole-trimethoprim].  MEDICATIONS:  Current Outpatient Prescriptions  Medication Sig Dispense Refill  . alendronate (FOSAMAX) 70 MG tablet Take 70 mg by mouth once a week. Take with a full glass of water on an empty stomach.    Marland Kitchen amLODipine (NORVASC) 5 MG tablet Take 5 mg by mouth daily.    Marland Kitchen aspirin EC 81 MG tablet Take 81 mg by mouth daily.    Marland Kitchen atorvastatin (LIPITOR) 10 MG tablet Take 10 mg by mouth daily.    . benazepril-hydrochlorthiazide (LOTENSIN HCT) 20-12.5 MG tablet Take 1 tablet by mouth daily.    . Cholecalciferol (VITAMIN D3) 5000 units CAPS Take 1 capsule by mouth daily.    Marland Kitchen diltiazem (CARDIZEM CD) 120 MG 24 hr capsule Take 1 capsule (120 mg total) by mouth daily. 30 capsule 5  . escitalopram (LEXAPRO) 10 MG tablet Take 10 mg by mouth daily.    . fexofenadine (ALLEGRA) 180 MG tablet Take 180 mg by mouth daily.    Marland Kitchen latanoprost (XALATAN) 0.005 % ophthalmic solution Place 1 drop into both eyes at bedtime.    . Multiple Vitamins-Minerals (VITEYES AREDS ADVANCED) CAPS Take 1 capsule by mouth daily.    Marland Kitchen omeprazole (PRILOSEC) 40 MG capsule Take 40 mg by mouth daily.    Vladimir Faster Glycol-Propyl Glycol (SYSTANE) 0.4-0.3 % SOLN Apply to eye as needed.    Marland Kitchen acetaminophen-codeine (TYLENOL #3) 300-30 MG tablet Take 1 tablet by mouth every 4 (four) hours as needed for moderate pain. 20 tablet 0   No current facility-administered medications for this visit.     REVIEW OF SYSTEMS:   Constitutional: Denies fevers, chills or abnormal  night sweats Eyes: Denies blurriness of vision, double vision or watery eyes Ears, nose, mouth, throat, and face: Denies mucositis or sore throat Respiratory: Denies cough, dyspnea or wheezes Cardiovascular: Denies palpitation, chest discomfort or lower extremity swelling Gastrointestinal:  Denies nausea, heartburn or change in bowel habits (+) abdominal pain/cramping  GU: (+) left flank pain Skin: Denies abnormal skin rashes Lymphatics: Denies new lymphadenopathy or easy bruising Neurological:Denies numbness, tingling or new weaknesses Behavioral/Psych: Mood is stable, no new changes  All  other systems were reviewed with the patient and are negative.  PHYSICAL EXAMINATION: ECOG PERFORMANCE STATUS: 1 - Symptomatic but completely ambulatory  Vitals:   11/13/16 1356  BP: 136/86  Pulse: 99  Resp: 17  Temp: 98.6 F (37 C)   Filed Weights   11/13/16 1356  Weight: 180 lb 6.4 oz (81.8 kg)   GENERAL:alert, no distress and comfortable SKIN: skin color, texture, turgor are normal, no rashes or significant lesions EYES: normal, conjunctiva are pink and non-injected, sclera clear OROPHARYNX:no exudate, no erythema and lips, buccal mucosa, and tongue normal  NECK: supple, thyroid normal size, non-tender, without nodularity LYMPH:  no palpable lymphadenopathy in the cervical, axillary or inguinal LUNGS: clear to auscultation and percussion with normal breathing effort HEART: regular rate & rhythm and no murmurs and no lower extremity edema ABDOMEN:abdomen soft, non-tender and normal bowel sounds Musculoskeletal:no cyanosis of digits and no clubbing  PSYCH: alert & oriented x 3 with fluent speech NEURO: no focal motor/sensory deficits  LABORATORY DATA:  I have reviewed the data as listed CBC Latest Ref Rng & Units 11/11/2016 10/31/2016 10/10/2016  WBC 4.0 - 10.5 K/uL 9.3 9.3 -  Hemoglobin 12.0 - 15.0 g/dL 11.8(L) 12.3 13.9  Hematocrit 36.0 - 46.0 % 35.7(L) 37.9 41.0  Platelets 150 - 400  K/uL 336 399 -   CMP Latest Ref Rng & Units 11/11/2016 10/31/2016 10/10/2016  Glucose 65 - 99 mg/dL 97 99 99  BUN 6 - 20 mg/dL 21(H) 15.9 11  Creatinine 0.44 - 1.00 mg/dL 0.81 0.9 0.70  Sodium 135 - 145 mmol/L 140 142 143  Potassium 3.5 - 5.1 mmol/L 3.9 4.6 4.0  Chloride 101 - 111 mmol/L 106 - 103  CO2 22 - 32 mmol/L 27 28 -  Calcium 8.9 - 10.3 mg/dL 9.5 9.9 -  Total Protein 6.5 - 8.1 g/dL 7.7 7.6 -  Total Bilirubin 0.3 - 1.2 mg/dL 0.8 0.60 -  Alkaline Phos 38 - 126 U/L 75 94 -  AST 15 - 41 U/L 19 16 -  ALT 14 - 54 U/L 14 15 -   PATHOLOGY: Diagnosis 11/11/16 Liver, needle/core biopsy, left lobe - METASTATIC ADENOCARCINOMA. - SEE COMMENT. Microscopic Comment The histologic features are consistent with a primary gastrointestinal adenocarcinoma. Dr. Vicente Males has reviewed the case and concurs with this interpretation. There is likely sufficient tumor present for additional studies, if requested. Enid Cutter MD Pathologist, Electronic Signature (Case signed 11/12/2016)  Diagnosis 09/04/2016 MODERATELY DIFFERENTIATED ADENOCARCINOMA OF COLON AT 20 CM Tumor Site: Colon at 20 cm Tumor Type: Adenocarcinoma Tumore Size: Unknown  Grade: Moderately differentiated   RADIOGRAPHIC STUDIES: I have personally reviewed the radiological images as listed and agreed with the findings in the report.  PET 11/06/2016 IMPRESSION: 1. Hypermetabolic pulmonary and hepatic lesions, most consistent with metastatic colon cancer, with the hypermetabolic primary located in the sigmoid colon. 2. Left renal and left adnexal lesions, better seen and evaluated on 10/15/2016. 3. Persistent moderate left hydronephrosis. 4. Aortic atherosclerosis (ICD10-170.0).  MRI Abdomen Pelvis w/wo Contrast 10/15/2016 IMPRESSION: 1. There are 2 enhancing hepatic lesions highly worrisome for metastatic disease, likely metastatic colon cancer. 2. The lesion of concern in the anterior suprahilar lip of the left kidney is  also enhancing, worrisome for neoplasm. This has a somewhat atypical appearance for renal cell carcinoma and could reflect a metastasis as well. 3. New left-sided hydroureter with delayed contrast excretion consistent with distal ureteral obstruction. Specific etiology uncertain. 4. Complex cystic and solid left adnexal lesion could  reflect cystic neoplasm, although does not appear aggressive or appear to reflect the cause of the distal left ureteral obstruction. 5. Sigmoid colon wall thickening could be related to reported newly diagnosed colon cancer.  CT CAP w Contrast 09/06/2016 (done outside) IMPRESSION: 1. No acute abdominopelvic process.  2. Heterogenous hypodense area involving the medial edge of the left kidney with multiple small soft tissue like stranding involving the left pararenal space. Findings are suspicious for malignancy of the left kidney with small pararenal metastases. MRI abdomen with contrast should be considered to further evaluate.  3. Vague hypodensity involving the lateral segment of the liver. This can also be reevaluated with MRI.  4. Partially visualized 5 mm posterior right lower lobe nodular density. CT chest without contrast follow up is recommended.  5. Complex multilobular left ovarian lesion may reflect a cluster of cysts.   Colonoscopy 09/02/2016 FINDINGS: Diverticulosis (scattered). Pt has about 10 cm structure starting at 20 cm with abnormal mucosa. The colonoscopy scope was not able to advance due to structure, and upper endoscopy scope was used and it was advanced through the stricture to see ileocecal valve. Biopsies at the stricture were taken.   PROCEDURES Echocardiogram 11/03/16 - Left ventricle: The cavity size was normal. Wall thickness was   increased in a pattern of mild LVH. Indeterminant diastolic   function (atrial fibrillation). Systolic function was normal. The   estimated ejection fraction was in the range of 60% to 65%.   Operative  Report by Dr. Tresa Moore on 10/10/16 Procedure: 1) Cystoscopy, left retrograde pyelogram interpretation. 2) Left diagnostic uteroscopy. 3) Insertion of left uteral stent; 5 x 24 Polaris with tether. Findings: 1) Unremarkable bladder. 2) Unremarkable left retrograde pyelogram. 3) No evidence of intraluminal urothelial neoplasm whatsoever with insepction of the left kidney and ureter times several. 4) Successful placement of left ureteral stent, proximal in renal pelvis and distal in urinary bladder.  ASSESSMENT & PLAN:  Mary [name] is a 83 y.o. female who presents for:  1. Adenocarcinoma of left colon, sigmoid colon, TxNxM1, stage IV  -I discussed the imaging, colonoscopy and biopsy results with the patient and her family in detail.  -I also reviewed her images with patient in person. She has 2 lesions in the liver, which are very suspicious for metastatic disease. -I recommend her to have a PET scan for staging purposes, she agreed. -She also has left kidney lesion, and complex multilobular left ovarian lesion, possible neoplastic, although less likely metastatic lesion from colon cancer. -If her liver lesions are positive on the PET scan, I think she likely has metastatic disease. I will ask IR to biopsy her left lobe liver lesion, to confirm metastasis and it's origin from her colon cancer. -she does not appear to have significant obstruction or bleeding from her sigmoid colon cancer. If she has metastatic disease, upfront surgery may not be indicated.  -She has seen Drs. Manny and Gross, surgical resections were discussed.  -I would recommended low intensity chemotherapy if she does have metastatic disese. Due to her advanced age, she may not be able to tolerate very intensive chemotherapy.  -She takes MiraLax to help her bowel movements. She also changed her diet and takes fiber supplements. Her abdominal pain and constipation has improved lately. -PET scan on 11/06/16 showed hypermetabolic pulmonary  and hepatic lesions consistent with metastatic colon cancer with the primary located in the sigmoid colon. I reviewed the images with pt and her family members in person  -US biopsy on 11/11/16 of  the left lobe of the liver revealed metastatic adenocarcinoma. The features are consistent with a primary gastrointestinal adenocarcinoma. I discussed with pt  -We discussed that her metastatic cancer is incurable at this stage due to the multiorgan metastasis, and her advanced age. The goal of therapy is palliative. Patient prefers to avoid surgery, and is not very interested in chemotherapy. We agree that our goal is to improve or preserve her quality of life, more than extend her life.  -We discussed various treatment options. I recommend a Foundation One test to see if she is a candidate for immunotherapy Beryle Flock), will review her case in our GI tumor board to see if she is a candidate for coronal stenting to prevent obstruction, or consider palliative radiation to the primary tumor, to improve her partial obstruction symptoms. We also discussed low intensity palliative chemotherapy with Xeloda.    2. Left renal mass and left hydronephrosis -Possible renal cell carcinoma, metastatic lesion from colon cancer is also a possibility. -She has been seen by [facility], Dr. Tresa Moore, and had left ureter stent placement on 10/10/2016.  3. Complex cyst of left ovary -will monitor.  -If she undergo abdominal surgery, may remove it   4. Atrial Fibrillation -She is seeing her cardiologist on 10/31/2016 at 9:15 am  5. HTN -Continue medications and follow up with primary care physician  6. Nutrition -We discussed palliative chemotherapy will make her fatigued, nauseous, and loss of appetite. -I stressed the importance of a healthy diet with plenty of protein. -I will make a referral to Nutrition.  7. Left flank and abdominal pain -The patient is taking Aleve for pain. -I prescribed Tylenol #3 on 11/13/16.  8.  Anemia -The patient's Hb is 11.8 on 4.4 and has decreased from 12.3 on 10/31/16 and 13.9 on 10/10/16. This is blood loss from her tumor. -I advised the patient to take an baby dose iron pills and eat iron rich foods. -She is to drink plenty of fluids to prevent constipation.  Plan -Referral to Nutrition -I prescribed Tylenol #3 today. -Foundation One tumor marker test to determine if she is a candidate for Keytruda. -Discuss the patient's case with GI and radiation oncology in tumor board next week. -Lab and f/u in 3 weeks. -I provided the patient will a handout on iron rich foods.  All questions were answered. The patient knows to call the clinic with any problems, questions or concerns.  Post patient and her family members had many questions, I answered to their satisfaction. I spent 30 minutes counseling the patient face to face. The total time spent in the appointment was 40 minutes and more than 50% was on counseling.   Truitt Merle, MD 11/13/2016   This document serves as a record of services personally performed by Truitt Merle, MD. It was created on her behalf by Darcus Austin, a trained medical scribe. The creation of this record is based on the scribe's personal observations and the provider's statements to them. This document has been checked and approved by the attending provider.

## 2016-11-05 ENCOUNTER — Ambulatory Visit: Payer: Medicare Other | Admitting: Hematology

## 2016-11-05 ENCOUNTER — Telehealth: Payer: Self-pay | Admitting: Cardiovascular Disease

## 2016-11-05 NOTE — Telephone Encounter (Signed)
Do not encounter ° °

## 2016-11-06 ENCOUNTER — Ambulatory Visit (HOSPITAL_COMMUNITY)
Admission: RE | Admit: 2016-11-06 | Discharge: 2016-11-06 | Disposition: A | Discharge status: Home / Self Care | Payer: Medicare Other | Source: Ambulatory Visit | Attending: Hematology | Admitting: Hematology

## 2016-11-06 ENCOUNTER — Ambulatory Visit: Payer: Medicare Other | Admitting: Hematology

## 2016-11-06 DIAGNOSIS — N1330 Unspecified hydronephrosis: Secondary | ICD-10-CM | POA: Diagnosis not present

## 2016-11-06 DIAGNOSIS — C186 Malignant neoplasm of descending colon: Secondary | ICD-10-CM | POA: Insufficient documentation

## 2016-11-06 DIAGNOSIS — R918 Other nonspecific abnormal finding of lung field: Secondary | ICD-10-CM | POA: Insufficient documentation

## 2016-11-06 DIAGNOSIS — K769 Liver disease, unspecified: Secondary | ICD-10-CM | POA: Diagnosis not present

## 2016-11-06 DIAGNOSIS — C189 Malignant neoplasm of colon, unspecified: Secondary | ICD-10-CM | POA: Diagnosis not present

## 2016-11-06 DIAGNOSIS — I700 Atherosclerosis of aorta: Secondary | ICD-10-CM | POA: Insufficient documentation

## 2016-11-06 DIAGNOSIS — N289 Disorder of kidney and ureter, unspecified: Secondary | ICD-10-CM | POA: Diagnosis not present

## 2016-11-06 LAB — GLUCOSE, CAPILLARY: GLUCOSE-CAPILLARY: 116 mg/dL — AB (ref 65–99)

## 2016-11-06 MED ORDER — FLUDEOXYGLUCOSE F - 18 (FDG) INJECTION: 8.5000 | Freq: Once | INTRAVENOUS | Status: DC | PRN

## 2016-11-07 ENCOUNTER — Other Ambulatory Visit: Payer: Self-pay | Admitting: Radiology

## 2016-11-11 ENCOUNTER — Ambulatory Visit (HOSPITAL_COMMUNITY)
Admission: RE | Admit: 2016-11-11 | Discharge: 2016-11-11 | Disposition: A | Discharge status: Home / Self Care | Payer: Medicare Other | Source: Ambulatory Visit | Attending: Hematology | Admitting: Hematology

## 2016-11-11 ENCOUNTER — Encounter (HOSPITAL_COMMUNITY): Payer: Self-pay

## 2016-11-11 DIAGNOSIS — Z7982 Long term (current) use of aspirin: Secondary | ICD-10-CM | POA: Insufficient documentation

## 2016-11-11 DIAGNOSIS — K7689 Other specified diseases of liver: Secondary | ICD-10-CM | POA: Diagnosis not present

## 2016-11-11 DIAGNOSIS — C186 Malignant neoplasm of descending colon: Secondary | ICD-10-CM | POA: Diagnosis not present

## 2016-11-11 DIAGNOSIS — C787 Secondary malignant neoplasm of liver and intrahepatic bile duct: Secondary | ICD-10-CM | POA: Diagnosis not present

## 2016-11-11 DIAGNOSIS — Z79899 Other long term (current) drug therapy: Secondary | ICD-10-CM | POA: Diagnosis not present

## 2016-11-11 DIAGNOSIS — C801 Malignant (primary) neoplasm, unspecified: Secondary | ICD-10-CM | POA: Diagnosis not present

## 2016-11-11 HISTORY — DX: Other specified postprocedural states: R11.2

## 2016-11-11 HISTORY — DX: Other specified postprocedural states: Z98.890

## 2016-11-11 LAB — COMPREHENSIVE METABOLIC PANEL
ALT: 14 U/L (ref 14–54)
AST: 19 U/L (ref 15–41)
Albumin: 4.1 g/dL (ref 3.5–5.0)
Alkaline Phosphatase: 75 U/L (ref 38–126)
Anion gap: 7 (ref 5–15)
BUN: 21 mg/dL — ABNORMAL HIGH (ref 6–20)
CHLORIDE: 106 mmol/L (ref 101–111)
CO2: 27 mmol/L (ref 22–32)
Calcium: 9.5 mg/dL (ref 8.9–10.3)
Creatinine, Ser: 0.81 mg/dL (ref 0.44–1.00)
Glucose, Bld: 97 mg/dL (ref 65–99)
POTASSIUM: 3.9 mmol/L (ref 3.5–5.1)
Sodium: 140 mmol/L (ref 135–145)
TOTAL PROTEIN: 7.7 g/dL (ref 6.5–8.1)
Total Bilirubin: 0.8 mg/dL (ref 0.3–1.2)

## 2016-11-11 LAB — PROTIME-INR
INR: 1
PROTHROMBIN TIME: 13.2 s (ref 11.4–15.2)

## 2016-11-11 LAB — CBC WITH DIFFERENTIAL/PLATELET
Basophils Absolute: 0 10*3/uL (ref 0.0–0.1)
Basophils Relative: 0 %
EOS PCT: 3 %
Eosinophils Absolute: 0.3 10*3/uL (ref 0.0–0.7)
HCT: 35.7 % — ABNORMAL LOW (ref 36.0–46.0)
Hemoglobin: 11.8 g/dL — ABNORMAL LOW (ref 12.0–15.0)
LYMPHS ABS: 2 10*3/uL (ref 0.7–4.0)
LYMPHS PCT: 22 %
MCH: 29.6 pg (ref 26.0–34.0)
MCHC: 33.1 g/dL (ref 30.0–36.0)
MCV: 89.7 fL (ref 78.0–100.0)
MONO ABS: 1.1 10*3/uL — AB (ref 0.1–1.0)
MONOS PCT: 12 %
Neutro Abs: 5.8 10*3/uL (ref 1.7–7.7)
Neutrophils Relative %: 63 %
PLATELETS: 336 10*3/uL (ref 150–400)
RBC: 3.98 MIL/uL (ref 3.87–5.11)
RDW: 13.7 % (ref 11.5–15.5)
WBC: 9.3 10*3/uL (ref 4.0–10.5)

## 2016-11-11 MED ORDER — MIDAZOLAM HCL 2 MG/2ML IJ SOLN
INTRAMUSCULAR | Status: AC | PRN
  Administered 2016-11-11 (×3): 1 mg via INTRAVENOUS

## 2016-11-11 MED ORDER — ACETAMINOPHEN 500 MG PO TABS
500.0000 mg | ORAL_TABLET | ORAL | Status: DC | PRN
  Administered 2016-11-11: 500 mg via ORAL
  Filled 2016-11-11 (×4): qty 1

## 2016-11-11 MED ORDER — FENTANYL CITRATE (PF) 100 MCG/2ML IJ SOLN
INTRAMUSCULAR | Status: AC | PRN
  Administered 2016-11-11 (×2): 50 ug via INTRAVENOUS

## 2016-11-11 MED ORDER — FENTANYL CITRATE (PF) 100 MCG/2ML IJ SOLN
INTRAMUSCULAR | Status: AC
  Filled 2016-11-11: qty 2

## 2016-11-11 MED ORDER — MIDAZOLAM HCL 2 MG/2ML IJ SOLN
INTRAMUSCULAR | Status: AC
  Filled 2016-11-11: qty 2

## 2016-11-11 MED ORDER — SODIUM CHLORIDE 0.9 % IV SOLN
INTRAVENOUS | Status: DC
  Administered 2016-11-11: 11:00:00 via INTRAVENOUS

## 2016-11-11 NOTE — Sedation Documentation (Signed)
Patient is resting comfortably. 

## 2016-11-11 NOTE — H&P (Signed)
Chief Complaint: Patient was seen in consultation today for liver lesion  Referring Physician(s): Feng,Yan  Supervising Physician: Markus Daft  Patient Status: [facility] - Out-pt  History of Present Illness: Mary [name] is a 83 y.o. female with past medical history of HTN, GERD, a fib, kidney tumor, who presents with newly diagnosed adenocarcinoma of the left colon, sigmoid colon, likely Stage 4.   MR Abd 10/15/16: 1. There are 2 enhancing hepatic lesions highly worrisome for metastatic disease, likely metastatic colon cancer. 2. The lesion of concern in the anterior suprahilar lip of the left kidney is also enhancing, worrisome for neoplasm. This has a somewhat atypical appearance for renal cell carcinoma and could reflect a metastasis as well. 3. New left-sided hydroureter with delayed contrast excretion consistent with distal ureteral obstruction. Specific etiology uncertain. 4. Complex cystic and solid left adnexal lesion could reflect cystic neoplasm, although does not appear aggressive or appear to reflect the cause of the distal left ureteral obstruction. 5. Sigmoid colon wall thickening could be related to reported newly diagnosed colon cancer.   PET 11/06/16: 1. Hypermetabolic pulmonary and hepatic lesions, most consistent with metastatic colon cancer, with the hypermetabolic primary located in the sigmoid colon. 2. Left renal and left adnexal lesions, better seen and evaluated on 10/15/2016. 3. Persistent moderate left hydronephrosis. 4. Aortic atherosclerosis (ICD10-170.0).  IR consulted for liver lesion biopsy.  Patient case was discussed in GI tumor board and approved by Dr. Laurence Ferrari.   Patient has been NPO.  She does not take blood thinners.  She has been in her usual state of health.   Past Medical History:  Diagnosis Date  . Colon cancer [facility])    dx via biospy from colonoscopy-- malignant ---  currently having work-up done w/ dr gross (general surgeon)  and coordinate surgery w/ urologsit for renal tumor  . Dysrhythmia    A  Fib  . Essential hypertension 10/31/2016  . GERD (gastroesophageal reflux disease)   . Glaucoma   . Hiatal hernia   . HOH (hard of hearing)    bilateral even w/ hearing aids  . Hyperlipidemia   . Hypertension   . Kidney tumor    left side  . Legally blind in right eye, as defined in [place]    due to macular degeneration  . Macular degeneration   . Nausea    intermittant due to colon mass  . Ovarian cyst   . Persistent atrial fibrillation ([place]) 10/31/2016  . PONV (postoperative nausea and vomiting)   . Wears glasses   . Wears hearing aid    bilateral    Past Surgical History:  Procedure Laterality Date  . BREAST CYST EXCISION  1990   benign  . COLONOSCOPY  09/04/2016  . CYSTOSCOPY WITH RETROGRADE PYELOGRAM, URETEROSCOPY AND STENT PLACEMENT Left 10/10/2016   Procedure: CYSTOSCOPY WITH RETROGRADE PYELOGRAM, DIAGNOSTIC URETEROSCOPY  AND STENT PLACEMENT;  Surgeon: Alexis Frock, MD;  Location: [facility];  Service: Urology;  Laterality: Left;  . ESOPHAGOGASTRODUODENOSCOPY  11/07/2014  . NASAL SINUS SURGERY    . TONSILLECTOMY      Allergies: Tramadol and Bactrim [sulfamethoxazole-trimethoprim]  Medications: Prior to Admission medications   Medication Sig Start Date End Date Taking? Authorizing Provider  alendronate (FOSAMAX) 70 MG tablet Take 70 mg by mouth once a week. Take with a full glass of water on an empty stomach.   Yes Historical Provider, MD  amLODipine (NORVASC) 5 MG tablet Take 5 mg by mouth daily.  Yes Historical Provider, MD  aspirin EC 81 MG tablet Take 81 mg by mouth daily.   Yes Historical Provider, MD  atorvastatin (LIPITOR) 10 MG tablet Take 10 mg by mouth daily.   Yes Historical Provider, MD  benazepril-hydrochlorthiazide (LOTENSIN HCT) 20-12.5 MG tablet Take 1 tablet by mouth daily.   Yes Historical Provider, MD  Cholecalciferol (VITAMIN D3) 5000 units CAPS Take 1  capsule by mouth daily.   Yes Historical Provider, MD  diltiazem (CARDIZEM CD) 120 MG 24 hr capsule Take 1 capsule (120 mg total) by mouth daily. 10/31/16 01/29/17 Yes Skeet Latch, MD  escitalopram (LEXAPRO) 10 MG tablet Take 10 mg by mouth daily.   Yes Historical Provider, MD  fexofenadine (ALLEGRA) 180 MG tablet Take 180 mg by mouth daily.   Yes Historical Provider, MD  latanoprost (XALATAN) 0.005 % ophthalmic solution Place 1 drop into both eyes at bedtime.   Yes Historical Provider, MD  Multiple Vitamins-Minerals (VITEYES AREDS ADVANCED) CAPS Take 1 capsule by mouth daily.   Yes Historical Provider, MD  omeprazole (PRILOSEC) 40 MG capsule Take 40 mg by mouth daily.   Yes Historical Provider, MD  Polyethyl Glycol-Propyl Glycol (SYSTANE) 0.4-0.3 % SOLN Apply to eye as needed.   Yes Historical Provider, MD     Family History  Problem Relation Age of Onset  . Cancer Sister 98    breast cancer   . Cancer Brother 63    lung cancer   . Diabetes Son   . High blood pressure Son   . Peripheral Artery Disease Mother   . High blood pressure Mother     Social History   Social History  . Marital status: Unknown    Spouse name: N/A  . Number of children: N/A  . Years of education: N/A   Social History Main Topics  . Smoking status: Never Smoker  . Smokeless tobacco: Never Used  . Alcohol use No  . Drug use: No  . Sexual activity: Not Asked   Other Topics Concern  . None   Social History Narrative  . None    Review of Systems  Constitutional: Negative for fatigue and fever.  Respiratory: Negative for cough and shortness of breath.   Cardiovascular: Negative for chest pain.  Gastrointestinal: Positive for abdominal pain and nausea.  Psychiatric/Behavioral: Negative for behavioral problems and confusion.    Vital Signs: BP (!) 163/108 (BP Location: Right Arm)   Pulse 98   Temp 97.8 F (36.6 C) (Oral)   Resp 18   SpO2 98%   Physical Exam  Constitutional: She is  oriented to person, place, and time. She appears well-developed.  Cardiovascular: Normal heart sounds.   Irregular irregular  Pulmonary/Chest: Effort normal and breath sounds normal. No respiratory distress.  Abdominal: Soft.  Neurological: She is alert and oriented to person, place, and time.  Skin: Skin is warm and dry.  Psychiatric: She has a normal mood and affect. Her behavior is normal. Judgment and thought content normal.  Nursing note and vitals reviewed.   Mallampati Score:  MD Evaluation Airway: WNL Heart: WNL Abdomen: WNL Chest/ Lungs: WNL ASA  Classification: 3 Mallampati/Airway Score: Two  Imaging: Mr Pelvis W Wo Contrast  Result Date: 10/15/2016 CLINICAL DATA:  Recent abnormal outside CT demonstrating hepatic and left renal masses. History of colon cancer with reported new colon cancer diagnosis. Recent cystoscopy with left ureteral stent placement. EXAM: MRI ABDOMEN AND PELVIS WITHOUT AND WITH CONTRAST TECHNIQUE: Multiplanar multisequence MR imaging of the abdomen  and pelvis was performed both before and after the administration of intravenous contrast. CONTRAST:  69mL MULTIHANCE GADOBENATE DIMEGLUMINE 529 MG/ML IV SOLN COMPARISON:  Abdominopelvic CT 09/06/2016 from [facility]. FINDINGS: COMBINED FINDINGS FOR BOTH MR ABDOMEN AND PELVIS Study is mildly motion degraded, especially on the early images through the abdomen. Lower chest: No significant findings are seen within the visualized lower chest. The small right lower lobe nodule seen on CT is not well evaluated by this examination. There is no pleural or pericardial effusion. Hepatobiliary: Corresponding with the abnormalities on CT, there are 2 liver lesions which are highly worrisome for metastatic disease. Both lesions demonstrate low T1 signal, mild T2 hyperintensity and enhancement following contrast. Lesion anteriorly in the dome of the right hepatic lobe (segment 8) measures 2.1 x 2.4 cm on image 21  of series 26. Lesion inferiorly in the lateral segment of the left lobe (segment 3) measures 1.9 x 1.5 cm on image 39. No other definite lesions are seen. Peripherally calcified large gallstone is better seen on CT. There may be some calcification within the wall of the gallbladder, but no evidence of enhancing mass. No biliary dilatation. Pancreas: Unremarkable. No pancreatic ductal dilatation or surrounding inflammatory changes. Spleen: Normal in size without focal abnormality. Adrenals/Urinary Tract: Both adrenal glands appear normal. The lesion of concern on prior CT is located in the anterior suprahilar lip of the left kidney and is most obvious in the coronal and axial planes. Most of the current study was performed in the axial plane. Therefore, this lesion is suboptimally visualized. The lesion measures up to 2.6 cm on coronal image 38 of the prior CT and demonstrates enhancement following contrast on this study. This is worrisome for neoplasm. There is new left-sided hydronephrosis and hydroureter. The left ureter is dilated to the ureterovesical junction. There is delayed contrast excretion from the left kidney. An obvious explanation for this apparent distal ureteral obstruction is not seen by this examination, although there may be mild enhancement of the distal left ureter. No ureteral stent is seen. The right kidney, ureter and bladder appear unremarkable. Stomach/Bowel: Sigmoid diverticular changes are present. There is wall thickening of the sigmoid colon, best seen on the coronal and sagittal images. Compared with recent CT, this appears more conspicuous. No evidence of bowel obstruction. There is moderate stool throughout the colon. Vascular/Lymphatic: There are no enlarged abdominal or pelvic lymph nodes. Small retroperitoneal lymph nodes are not pathologically enlarged. There is mild aortic and branch vessel atherosclerosis, better seen on CT. No venous abnormalities are apparent. Reproductive:  Hysterectomy. There is a mildly complex multi cystic left adnexal lesion measuring 4.9 x 3.4 cm. Aside from what appears to be normal surrounding ovarian tissue, this lesion demonstrates no suspicious enhancement or solid components. This lies adjacent to the distal left ureter, but does not appear to be causing its obstruction. Other: No ascites or peritoneal nodularity. Musculoskeletal: No acute or significant osseous findings. Mild spondylosis. IMPRESSION: 1. There are 2 enhancing hepatic lesions highly worrisome for metastatic disease, likely metastatic colon cancer. 2. The lesion of concern in the anterior suprahilar lip of the left kidney is also enhancing, worrisome for neoplasm. This has a somewhat atypical appearance for renal cell carcinoma and could reflect a metastasis as well. 3. New left-sided hydroureter with delayed contrast excretion consistent with distal ureteral obstruction. Specific etiology uncertain. 4. Complex cystic and solid left adnexal lesion could reflect cystic neoplasm, although does not appear aggressive or appear to reflect the  cause of the distal left ureteral obstruction. 5. Sigmoid colon wall thickening could be related to reported newly diagnosed colon cancer. Electronically Signed   By: Richardean Sale M.D.   On: 10/15/2016 15:54   Mr Abdomen Wwo Contrast  Result Date: 10/15/2016 CLINICAL DATA:  Recent abnormal outside CT demonstrating hepatic and left renal masses. History of colon cancer with reported new colon cancer diagnosis. Recent cystoscopy with left ureteral stent placement. EXAM: MRI ABDOMEN AND PELVIS WITHOUT AND WITH CONTRAST TECHNIQUE: Multiplanar multisequence MR imaging of the abdomen and pelvis was performed both before and after the administration of intravenous contrast. CONTRAST:  58mL MULTIHANCE GADOBENATE DIMEGLUMINE 529 MG/ML IV SOLN COMPARISON:  Abdominopelvic CT 09/06/2016 from [facility]. FINDINGS: COMBINED FINDINGS FOR BOTH MR  ABDOMEN AND PELVIS Study is mildly motion degraded, especially on the early images through the abdomen. Lower chest: No significant findings are seen within the visualized lower chest. The small right lower lobe nodule seen on CT is not well evaluated by this examination. There is no pleural or pericardial effusion. Hepatobiliary: Corresponding with the abnormalities on CT, there are 2 liver lesions which are highly worrisome for metastatic disease. Both lesions demonstrate low T1 signal, mild T2 hyperintensity and enhancement following contrast. Lesion anteriorly in the dome of the right hepatic lobe (segment 8) measures 2.1 x 2.4 cm on image 21 of series 26. Lesion inferiorly in the lateral segment of the left lobe (segment 3) measures 1.9 x 1.5 cm on image 39. No other definite lesions are seen. Peripherally calcified large gallstone is better seen on CT. There may be some calcification within the wall of the gallbladder, but no evidence of enhancing mass. No biliary dilatation. Pancreas: Unremarkable. No pancreatic ductal dilatation or surrounding inflammatory changes. Spleen: Normal in size without focal abnormality. Adrenals/Urinary Tract: Both adrenal glands appear normal. The lesion of concern on prior CT is located in the anterior suprahilar lip of the left kidney and is most obvious in the coronal and axial planes. Most of the current study was performed in the axial plane. Therefore, this lesion is suboptimally visualized. The lesion measures up to 2.6 cm on coronal image 38 of the prior CT and demonstrates enhancement following contrast on this study. This is worrisome for neoplasm. There is new left-sided hydronephrosis and hydroureter. The left ureter is dilated to the ureterovesical junction. There is delayed contrast excretion from the left kidney. An obvious explanation for this apparent distal ureteral obstruction is not seen by this examination, although there may be mild enhancement of the distal  left ureter. No ureteral stent is seen. The right kidney, ureter and bladder appear unremarkable. Stomach/Bowel: Sigmoid diverticular changes are present. There is wall thickening of the sigmoid colon, best seen on the coronal and sagittal images. Compared with recent CT, this appears more conspicuous. No evidence of bowel obstruction. There is moderate stool throughout the colon. Vascular/Lymphatic: There are no enlarged abdominal or pelvic lymph nodes. Small retroperitoneal lymph nodes are not pathologically enlarged. There is mild aortic and branch vessel atherosclerosis, better seen on CT. No venous abnormalities are apparent. Reproductive: Hysterectomy. There is a mildly complex multi cystic left adnexal lesion measuring 4.9 x 3.4 cm. Aside from what appears to be normal surrounding ovarian tissue, this lesion demonstrates no suspicious enhancement or solid components. This lies adjacent to the distal left ureter, but does not appear to be causing its obstruction. Other: No ascites or peritoneal nodularity. Musculoskeletal: No acute or significant osseous findings. Mild spondylosis.  IMPRESSION: 1. There are 2 enhancing hepatic lesions highly worrisome for metastatic disease, likely metastatic colon cancer. 2. The lesion of concern in the anterior suprahilar lip of the left kidney is also enhancing, worrisome for neoplasm. This has a somewhat atypical appearance for renal cell carcinoma and could reflect a metastasis as well. 3. New left-sided hydroureter with delayed contrast excretion consistent with distal ureteral obstruction. Specific etiology uncertain. 4. Complex cystic and solid left adnexal lesion could reflect cystic neoplasm, although does not appear aggressive or appear to reflect the cause of the distal left ureteral obstruction. 5. Sigmoid colon wall thickening could be related to reported newly diagnosed colon cancer. Electronically Signed   By: Richardean Sale M.D.   On: 10/15/2016 15:54   Nm  Pet Image Initial (pi) Skull Base To Thigh  Result Date: 11/06/2016 CLINICAL DATA:  Initial treatment strategy for colon cancer. EXAM: NUCLEAR MEDICINE PET SKULL BASE TO THIGH TECHNIQUE: 8.5 mCi F-18 FDG was injected intravenously. Full-ring PET imaging was performed from the skull base to thigh after the radiotracer. CT data was obtained and used for attenuation correction and anatomic localization. FASTING BLOOD GLUCOSE:  Value: 116 mg/dl COMPARISON:  MR abdomen pelvis 10/15/2016 and outside CT abdomen pelvis 09/06/2016. FINDINGS: NECK No hypermetabolic lymph nodes in the neck. CT images show no acute findings. CHEST No hypermetabolic mediastinal, hilar or axillary lymph nodes. There are scattered pulmonary nodules, many of which are too small for PET resolution. Nodular soft tissue in the medial left lower lobe measures 0.8 x 1.4 cm (series 8, image 44) with an SUV max of 7.0. Atherosclerotic calcification of the arterial vasculature. No pericardial or pleural effusion. ABDOMEN/PELVIS There are 2 hypermetabolic lesions in the liver. Index lesion in the dome measures 2.9 cm with an SUV max of 10.4. The other hypermetabolic lesion is seen in the left hepatic lobe and measures approximately 1.5 cm. Hypermetabolic segment of sigmoid colonic wall thickening measures roughly 3.0 x 3.5 cm with an SUV max of 13.1. A nodular lesion adjacent to the proximal sigmoid colon (series 4, image 164) is strongly felt to represent a diverticulum when 10/15/2016 is reviewed. No hypermetabolic lymph nodes. No abnormal hypermetabolism in the adrenal glands, spleen or pancreas. Calcification is seen in the gallbladder wall. Adrenal glands and right kidney are unremarkable. Left hydronephrosis. Enhancing lesion in the left kidney, as seen on 10/15/2016, is poorly appreciated. Persistent moderate left hydronephrosis. Spleen, pancreas, stomach and bowel are otherwise unremarkable. Complex cystic lesion in the left adnexa, better seen on  10/15/2016. SKELETON No abnormal osseous hypermetabolism. Degenerative changes are seen in the spine. IMPRESSION: 1. Hypermetabolic pulmonary and hepatic lesions, most consistent with metastatic colon cancer, with the hypermetabolic primary located in the sigmoid colon. 2. Left renal and left adnexal lesions, better seen and evaluated on 10/15/2016. 3. Persistent moderate left hydronephrosis. 4. Aortic atherosclerosis (ICD10-170.0). Electronically Signed   By: Lorin Picket M.D.   On: 11/06/2016 09:22    Labs:  CBC:  Recent Labs  10/10/16 1109 10/31/16 0938 11/11/16 1106  WBC  --  9.3 9.3  HGB 13.9 12.3 11.8*  HCT 41.0 37.9 35.7*  PLT  --  399 336    COAGS:  Recent Labs  11/11/16 1106  INR 1.00    BMP:  Recent Labs  10/10/16 1109 10/31/16 1039 11/11/16 1106  NA 143 142 140  K 4.0 4.6 3.9  CL 103  --  106  CO2  --  28 27  GLUCOSE 99 99  97  BUN 11 15.9 21*  CALCIUM  --  9.9 9.5  CREATININE 0.70 0.9 0.81  GFRNONAA  --   --  >60  GFRAA  --   --  >60    LIVER FUNCTION TESTS:  Recent Labs  10/31/16 1039 11/11/16 1106  BILITOT 0.60 0.8  AST 16 19  ALT 15 14  ALKPHOS 94 75  PROT 7.6 7.7  ALBUMIN 3.7 4.1    TUMOR MARKERS: No results for input(s): AFPTM, CEA, CA199, CHROMGRNA in the last 8760 hours.  Assessment and Plan: Mary [name] is a 83 y.o. female with past medical history of HTN, GERD, a fib, kidney tumor, who presents with newly diagnosed adenocarcinoma of the left colon, sigmoid colon, likely Stage 4.  Recent MR Abd and PET scan also show liver lesions.  IR consulted for liver lesion biopsy at the request of Dr. Burr Medico.  Case reviewed at GI tumor board by Dr. Laurence Ferrari who approves patient for procedure.  Risks and benefits discussed with the patient including, but not limited to bleeding, infection, damage to adjacent structures or low yield requiring additional tests. All of the patient's questions were answered, patient is agreeable to  proceed. Consent signed and in chart.  Thank you for this interesting consult.  I greatly enjoyed meeting JIALI LINNEY and look forward to participating in their care.  A copy of this report was sent to the requesting provider on this date.  Electronically Signed: Docia Barrier 11/11/2016, 12:08 PM   I spent a total of  30 Minutes   in face to face in clinical consultation, greater than 50% of which was counseling/coordinating care for liver lesion

## 2016-11-11 NOTE — Procedures (Signed)
  Pre-operative Diagnosis: Colon cancer with left renal lesion and liver lesions      Post-operative Diagnosis: Colon cancer with left renal lesion and liver lesions   Indications: Need to evaluate for metastatic disease  Procedure: US guided liver lesion biopsy  Findings: Subtle lesion in left hepatic lobe.  3 cores obtained.  Complications: None     EBL: Minimal  Plan: Bedrest 3 hours.

## 2016-11-11 NOTE — Sedation Documentation (Signed)
Bandaid R upper abd area intact

## 2016-11-11 NOTE — Discharge Instructions (Signed)
Moderate Conscious Sedation, Adult, Care After °These instructions provide you with information about caring for yourself after your procedure. Your health care provider may also give you more specific instructions. Your treatment has been planned according to current medical practices, but problems sometimes occur. Call your health care provider if you have any problems or questions after your procedure. °What can I expect after the procedure? °After your procedure, it is common: °· To feel sleepy for several hours. °· To feel clumsy and have poor balance for several hours. °· To have poor judgment for several hours. °· To vomit if you eat too soon. °Follow these instructions at home: °For at least 24 hours after the procedure:  ° °· Do not: °¨ Participate in activities where you could fall or become injured. °¨ Drive. °¨ Use heavy machinery. °¨ Drink alcohol. °¨ Take sleeping pills or medicines that cause drowsiness. °¨ Make important decisions or sign legal documents. °¨ Take care of children on your own. °· Rest. °Eating and drinking  °· Follow the diet recommended by your health care provider. °· If you vomit: °¨ Drink water, juice, or soup when you can drink without vomiting. °¨ Make sure you have little or no nausea before eating solid foods. °General instructions  °· Have a responsible adult stay with you until you are awake and alert. °· Take over-the-counter and prescription medicines only as told by your health care provider. °· If you smoke, do not smoke without supervision. °· Keep all follow-up visits as told by your health care provider. This is important. °Contact a health care provider if: °· You keep feeling nauseous or you keep vomiting. °· You feel light-headed. °· You develop a rash. °· You have a fever. °Get help right away if: °· You have trouble breathing. °This information is not intended to replace advice given to you by your health care provider. Make sure you discuss any questions you  have with your health care provider. °Document Released: 05/19/2013 Document Revised: 01/01/2016 Document Reviewed: 11/18/2015 °Elsevier Interactive Patient Education © 2017 [employer] ° ° °Liver Biopsy, Care After °These instructions give you information on caring for yourself after your procedure. Your doctor may also give you more specific instructions. Call your doctor if you have any problems or questions after your procedure. °Follow these instructions at home: °· Rest at home for 1-2 days or as told by your doctor. °· Have someone stay with you for at least 24 hours. °· Do not do these things in the first 24 hours: °¨ Drive. °¨ Use machinery. °¨ Take care of other people. °¨ Sign legal documents. °¨ Take a bath or shower. °· There are many different ways to close and cover a cut (incision). For example, a cut can be closed with stitches, skin glue, or adhesive strips. Follow your doctor's instructions on: °¨ Taking care of your cut. °¨ Changing and removing your bandage (dressing). °¨ Removing whatever was used to close your cut. °· Do not drink alcohol in the first week. °· Do not lift more than 5 pounds or play contact sports for the first 2 weeks. °· Take medicines only as told by your doctor. For 1 week, do not take medicine that has aspirin in it or medicines like ibuprofen. °· Get your test results. °Contact a doctor if: °· A cut bleeds and leaves more than just a small spot of blood. °· A cut is red, puffs up (swells), or hurts more than before. °· Fluid or   something else comes from a cut. °· A cut smells bad. °· You have a fever or chills. °Get help right away if: °· You have swelling, bloating, or pain in your belly (abdomen). °· You get dizzy or faint. °· You have a rash. °· You feel sick to your stomach (nauseous) or throw up (vomit). °· You have trouble breathing, feel short of breath, or feel faint. °· Your chest hurts. °· You have problems talking or seeing. °· You have trouble balancing or  moving your arms or legs. °This information is not intended to replace advice given to you by your health care provider. Make sure you discuss any questions you have with your health care provider. °Document Released: 05/07/2008 Document Revised: 01/04/2016 Document Reviewed: 09/24/2013 °Elsevier Interactive Patient Education © 2017 [employer] ° °

## 2016-11-13 ENCOUNTER — Ambulatory Visit (HOSPITAL_BASED_OUTPATIENT_CLINIC_OR_DEPARTMENT_OTHER): Payer: Medicare Other | Admitting: Hematology

## 2016-11-13 ENCOUNTER — Telehealth: Payer: Self-pay | Admitting: Hematology

## 2016-11-13 VITALS — BP 136/86 | HR 99 | Temp 98.6°F | Resp 17 | Ht 64.0 in | Wt 180.4 lb

## 2016-11-13 DIAGNOSIS — I4891 Unspecified atrial fibrillation: Secondary | ICD-10-CM

## 2016-11-13 DIAGNOSIS — R109 Unspecified abdominal pain: Secondary | ICD-10-CM | POA: Diagnosis not present

## 2016-11-13 DIAGNOSIS — C7800 Secondary malignant neoplasm of unspecified lung: Secondary | ICD-10-CM | POA: Diagnosis not present

## 2016-11-13 DIAGNOSIS — I10 Essential (primary) hypertension: Secondary | ICD-10-CM

## 2016-11-13 DIAGNOSIS — N1330 Unspecified hydronephrosis: Secondary | ICD-10-CM | POA: Diagnosis not present

## 2016-11-13 DIAGNOSIS — D649 Anemia, unspecified: Secondary | ICD-10-CM | POA: Diagnosis not present

## 2016-11-13 DIAGNOSIS — N83202 Unspecified ovarian cyst, left side: Secondary | ICD-10-CM

## 2016-11-13 DIAGNOSIS — N289 Disorder of kidney and ureter, unspecified: Secondary | ICD-10-CM | POA: Diagnosis not present

## 2016-11-13 DIAGNOSIS — C186 Malignant neoplasm of descending colon: Secondary | ICD-10-CM

## 2016-11-13 DIAGNOSIS — C787 Secondary malignant neoplasm of liver and intrahepatic bile duct: Secondary | ICD-10-CM

## 2016-11-13 MED ORDER — ACETAMINOPHEN-CODEINE #3 300-30 MG PO TABS: 1.0000 | ORAL_TABLET | ORAL | 0 refills | Status: DC | PRN

## 2016-11-13 NOTE — Telephone Encounter (Signed)
Gave patient AVS and calender per 11/13/2016 los. f/u in 3 weeks.

## 2016-11-16 ENCOUNTER — Encounter: Payer: Self-pay | Admitting: Hematology

## 2016-11-27 ENCOUNTER — Other Ambulatory Visit: Payer: Self-pay | Admitting: Hematology

## 2016-12-02 ENCOUNTER — Ambulatory Visit (INDEPENDENT_AMBULATORY_CARE_PROVIDER_SITE_OTHER): Payer: Medicare Other | Admitting: Cardiovascular Disease

## 2016-12-02 VITALS — BP 147/88 | HR 89 | Ht 64.0 in | Wt 182.2 lb

## 2016-12-02 DIAGNOSIS — E7800 Pure hypercholesterolemia, unspecified: Secondary | ICD-10-CM

## 2016-12-02 DIAGNOSIS — I7121 Aneurysm of the ascending aorta, without rupture: Secondary | ICD-10-CM

## 2016-12-02 DIAGNOSIS — I481 Persistent atrial fibrillation: Secondary | ICD-10-CM | POA: Diagnosis not present

## 2016-12-02 DIAGNOSIS — I10 Essential (primary) hypertension: Secondary | ICD-10-CM

## 2016-12-02 DIAGNOSIS — I4819 Other persistent atrial fibrillation: Secondary | ICD-10-CM

## 2016-12-02 DIAGNOSIS — I712 Thoracic aortic aneurysm, without rupture: Secondary | ICD-10-CM | POA: Diagnosis not present

## 2016-12-02 MED ORDER — DILTIAZEM HCL ER COATED BEADS 240 MG PO CP24: 240.0000 mg | ORAL_CAPSULE | Freq: Every day | ORAL | 5 refills | Status: DC

## 2016-12-02 NOTE — Progress Notes (Signed)
Cardiology Office Note   Date:  12/03/2016   ID:  [name], DOB 02/07/1933, MRN [id number]  PCP:  Moshe Cipro, MD  Cardiologist:   Skeet Latch, MD  Surgeon:  Michael Boston, MD Medical Oncologist: Truitt Merle, MD Gastroenterologist: Toma Deiters, MD Urology: Alexis Frock, MD  Chief Complaint  Patient presents with  . Follow-up    1 Month, pt denied chest pain and SOB, pt c/o tiredness ,very fatigue      History of Present Illness: Mary [name] is a 83 y.o. female with paroxysmal atrial fibrillation, hypertension, hyperlipidemia, recently diagnosed metastatic colon cancer, and macular degeneration, who presents for follow up.  She was first seen 10/2016 for management of atrial fibrillation and pre-surgical risk assessment.  Mary [name] was diagnosed with adenocarcinoma of the sigmoid colon 08/2016.  She had significant obstructive symptoms but changed her diet and is no longer constipated. She underwent cystoscopy and ureteroscopy with insertion of a L ureteral stent on 10/10/16 to better evaluate her kidney lesions.  At that time she was noted to be in atrial fibrillation. She has not experienced any palpitations but does report fatigue.  At her last appointment she was not started on anticoagulation as she had upcoming biopsies and possible surgery.  She was started on diltiazem for improved rate control.  Since that time it was discovered that her cancer is metastatic and it is unlikely that she will have surgery.  She elects palliation.  She had an echo 11/04/16 that revealed LVEF 60-65% with mild LVH and a mildly dilated ascending aorta.  [name] has not experienced any chest pain or shortness of breath.  This weekend she was very active and has felt tired.  She has the energy to do most of what she wants but does get tired with exertion.  She endorses mild edema but denies orthopnea or PND.     Past Medical History:  Diagnosis Date  . Aneurysm of ascending aorta  (HCC) 12/03/2016   3.7 cm by echo 10/2016  . Colon cancer [facility])    dx via biospy from colonoscopy-- malignant ---  currently having work-up done w/ dr gross (general surgeon) and coordinate surgery w/ urologsit for renal tumor  . Dysrhythmia    A  Fib  . Essential hypertension 10/31/2016  . GERD (gastroesophageal reflux disease)   . Glaucoma   . Hiatal hernia   . HOH (hard of hearing)    bilateral even w/ hearing aids  . Hyperlipidemia   . Hypertension   . Kidney tumor    left side  . Legally blind in right eye, as defined in [place]    due to macular degeneration  . Macular degeneration   . Nausea    intermittant due to colon mass  . Ovarian cyst   . Persistent atrial fibrillation ([name]) 10/31/2016  . PONV (postoperative nausea and vomiting)   . Wears glasses   . Wears hearing aid    bilateral    Past Surgical History:  Procedure Laterality Date  . BREAST CYST EXCISION  1990   benign  . COLONOSCOPY  09/04/2016  . CYSTOSCOPY WITH RETROGRADE PYELOGRAM, URETEROSCOPY AND STENT PLACEMENT Left 10/10/2016   Procedure: CYSTOSCOPY WITH RETROGRADE PYELOGRAM, DIAGNOSTIC URETEROSCOPY  AND STENT PLACEMENT;  Surgeon: Alexis Frock, MD;  Location: [facility];  Service: Urology;  Laterality: Left;  . ESOPHAGOGASTRODUODENOSCOPY  11/07/2014  . NASAL SINUS SURGERY    . TONSILLECTOMY       Current  Outpatient Prescriptions  Medication Sig Dispense Refill  . acetaminophen-codeine (TYLENOL #3) 300-30 MG tablet Take 1 tablet by mouth every 4 (four) hours as needed for moderate pain. 20 tablet 0  . alendronate (FOSAMAX) 70 MG tablet Take 70 mg by mouth once a week. Take with a full glass of water on an empty stomach.    Marland Kitchen amLODipine (NORVASC) 5 MG tablet Take 5 mg by mouth daily.    Marland Kitchen aspirin EC 81 MG tablet Take 81 mg by mouth daily.    Marland Kitchen atorvastatin (LIPITOR) 10 MG tablet Take 10 mg by mouth daily.    . benazepril-hydrochlorthiazide (LOTENSIN HCT) 20-12.5 MG tablet Take 1 tablet by  mouth daily.    . Cholecalciferol (VITAMIN D3) 5000 units CAPS Take 1 capsule by mouth daily.    Marland Kitchen diltiazem (CARDIZEM CD) 240 MG 24 hr capsule Take 1 capsule (240 mg total) by mouth daily. 30 capsule 5  . escitalopram (LEXAPRO) 10 MG tablet Take 10 mg by mouth daily.    . fexofenadine (ALLEGRA) 180 MG tablet Take 180 mg by mouth daily.    Marland Kitchen latanoprost (XALATAN) 0.005 % ophthalmic solution Place 1 drop into both eyes at bedtime.    . Multiple Vitamins-Minerals (VITEYES AREDS ADVANCED) CAPS Take 1 capsule by mouth daily.    Marland Kitchen omeprazole (PRILOSEC) 40 MG capsule Take 40 mg by mouth daily.    Vladimir Faster Glycol-Propyl Glycol (SYSTANE) 0.4-0.3 % SOLN Apply to eye as needed.     No current facility-administered medications for this visit.     Allergies:   Tramadol and Bactrim [sulfamethoxazole-trimethoprim]    Social History:  The patient  reports that she has never smoked. She has never used smokeless tobacco. She reports that she does not drink alcohol or use drugs.   Family History:  The patient's family history includes Cancer (age of onset: 15) in her brother; Cancer (age of onset: 78) in her sister; Diabetes in her son; High blood pressure in her mother and son; Peripheral Artery Disease in her mother.    ROS:  Please see the history of present illness.   Otherwise, review of systems are positive for none.   All other systems are reviewed and negative.    PHYSICAL EXAM: VS:  BP (!) 147/88   Pulse 89   Ht 5\' 4"  (1.626 m)   Wt 82.6 kg (182 lb 3.2 oz)   BMI 31.27 kg/m  , BMI Body mass index is 31.27 kg/m. GENERAL:  Well appearing.  No acute distress. HEENT:  Pupils equal round and reactive, fundi not visualized, oral mucosa unremarkable NECK:  No jugular venous distention, waveform within normal limits, carotid upstroke brisk and symmetric, no bruits LYMPHATICS:  No cervical adenopathy LUNGS:  Clear to auscultation bilaterally HEART:  Irregularly irregular.  PMI not displaced or  sustained,S1 and S2 within normal limits, no S3, no S4, no clicks, no rubs, no murmurs ABD:  Flat, positive bowel sounds normal in frequency in pitch, no bruits, no rebound, no guarding, no midline pulsatile mass, no hepatomegaly, no splenomegaly EXT:  2 plus pulses throughout, no edema, no cyanosis no clubbing SKIN:  No rashes no nodules NEURO:  Cranial nerves II through XII grossly intact, motor grossly intact throughout PSYCH:  Cognitively intact, oriented to person place and time   EKG:  EKG is not ordered today. The ekg ordered 10/31/16 demonstrates atrial fibrillation.  Rate 105 bpm. LAFB. Poor R wave progression  Echo 11/04/16: Study Conclusions  - Left  ventricle: The cavity size was normal. Wall thickness was   increased in a pattern of mild LVH. Indeterminant diastolic   function (atrial fibrillation). Systolic function was normal. The   estimated ejection fraction was in the range of 60% to 65%. Wall   motion was normal; there were no regional wall motion   abnormalities. - Aortic valve: There was no stenosis. - Aorta: Mildly dilated aortic root. Aortic root dimension: 37 mm   (ED). - Mitral valve: Mildly calcified annulus. There was trivial   regurgitation. - Left atrium: The atrium was mildly dilated. - Right ventricle: The cavity size was normal. Systolic function   was normal. - Right atrium: The atrium was mildly dilated. - Tricuspid valve: Peak RV-RA gradient (S): 34 mm Hg. - Pulmonary arteries: PA peak pressure: 37 mm Hg (S). - Inferior vena cava: The vessel was normal in size. The   respirophasic diameter changes were in the normal range (>= 50%),   consistent with normal central venous pressure.   Recent Labs: 10/31/2016: TSH 1.82 11/11/2016: ALT 14; BUN 21; Creatinine, Ser 0.81; Hemoglobin 11.8; Platelets 336; Potassium 3.9; Sodium 140    Lipid Panel No results found for: CHOL, TRIG, HDL, CHOLHDL, VLDL, LDLCALC, LDLDIRECT    Wt Readings from Last 3  Encounters:  12/02/16 82.6 kg (182 lb 3.2 oz)  11/13/16 81.8 kg (180 lb 6.4 oz)  10/31/16 79.8 kg (176 lb)      ASSESSMENT AND PLAN:  # Persistent atrial fibrillation: Ms. Maris remains in atrial fibrillation.  It is unclear how much of her fatigue is due to atrial fibrillation vs. Fatigue.  Her heart rate is 89 today and was 99 at a recent appointment.  We will increase diltiazem to 240 mg daily, which should also help her BP.  We had a long discussion about the possibility of DCCV.  She is not interested at this time but will consider it.  This would require at least three weeks of anticoagulation prior (or TEE/DCCV) and four weeks after.  She would like think about it as well as talk with her oncologist, Dr. Burr Medico, to see if anticoagulation would be an option for her.  Continue aspirin for now.  # Hypertension:  Blood pressure mildly elevated today.  Increase diltiazem as above.  Continue amlodipine, benazepril, and HCTZ.  # Hyperlipidemia: Continue atorvastatin.  # Mild ascending aortic aneurysm: No further monitoring indicated given comorbid disease.  Current medicines are reviewed at length with the patient today.  The patient does not have concerns regarding medicines.  The following changes have been made:  Increase diltiazem  Labs/ tests ordered today include:   No orders of the defined types were placed in this encounter.   Time spent: 45 minutes-Greater than 50% of this time was spent in counseling, explanation of diagnosis, planning of further management, and coordination of care.   Disposition:   FU with Janyiah Silveri C. Oval Linsey, MD, [facility] in 3 months.   This note was written with the assistance of speech recognition software.  Please excuse any transcriptional errors.  Signed, Bryar Rennie C. Oval Linsey, MD, [facility]  12/03/2016 11:02 PM    [facility]

## 2016-12-02 NOTE — Patient Instructions (Signed)
Medication Instructions:  INCREASE YOUR DILTIAZEM TO 240 MG DAILY   Labwork: NONE  Testing/Procedures: NONE  Follow-Up: Your physician recommends that you schedule a follow-up appointment in: 3 MONTH OV   If you need a refill on your cardiac medications before your next appointment, please call your pharmacy.

## 2016-12-03 ENCOUNTER — Encounter: Payer: Self-pay | Admitting: Cardiovascular Disease

## 2016-12-03 ENCOUNTER — Other Ambulatory Visit: Payer: Self-pay | Admitting: *Deleted

## 2016-12-03 DIAGNOSIS — I712 Thoracic aortic aneurysm, without rupture: Secondary | ICD-10-CM

## 2016-12-03 DIAGNOSIS — C186 Malignant neoplasm of descending colon: Secondary | ICD-10-CM

## 2016-12-03 DIAGNOSIS — I7121 Aneurysm of the ascending aorta, without rupture: Secondary | ICD-10-CM

## 2016-12-03 HISTORY — DX: Aneurysm of the ascending aorta, without rupture: I71.21

## 2016-12-03 HISTORY — DX: Thoracic aortic aneurysm, without rupture: I71.2

## 2016-12-04 ENCOUNTER — Ambulatory Visit: Payer: Medicare Other | Admitting: Hematology

## 2016-12-04 ENCOUNTER — Ambulatory Visit (HOSPITAL_BASED_OUTPATIENT_CLINIC_OR_DEPARTMENT_OTHER): Payer: Medicare Other | Admitting: Hematology

## 2016-12-04 ENCOUNTER — Encounter: Payer: Medicare Other | Admitting: Nutrition

## 2016-12-04 ENCOUNTER — Other Ambulatory Visit (HOSPITAL_BASED_OUTPATIENT_CLINIC_OR_DEPARTMENT_OTHER): Payer: Medicare Other

## 2016-12-04 ENCOUNTER — Telehealth: Payer: Self-pay | Admitting: Hematology

## 2016-12-04 ENCOUNTER — Encounter (HOSPITAL_COMMUNITY): Payer: Self-pay

## 2016-12-04 ENCOUNTER — Other Ambulatory Visit: Payer: Medicare Other

## 2016-12-04 VITALS — BP 128/61 | HR 80 | Temp 98.8°F | Resp 17 | Ht 64.0 in | Wt 182.0 lb

## 2016-12-04 DIAGNOSIS — Z7189 Other specified counseling: Secondary | ICD-10-CM

## 2016-12-04 DIAGNOSIS — C787 Secondary malignant neoplasm of liver and intrahepatic bile duct: Secondary | ICD-10-CM

## 2016-12-04 DIAGNOSIS — C186 Malignant neoplasm of descending colon: Secondary | ICD-10-CM | POA: Diagnosis not present

## 2016-12-04 DIAGNOSIS — N289 Disorder of kidney and ureter, unspecified: Secondary | ICD-10-CM | POA: Diagnosis not present

## 2016-12-04 DIAGNOSIS — D649 Anemia, unspecified: Secondary | ICD-10-CM

## 2016-12-04 LAB — CBC WITH DIFFERENTIAL/PLATELET
BASO%: 0.6 % (ref 0.0–2.0)
Basophils Absolute: 0.1 10*3/uL (ref 0.0–0.1)
EOS%: 3.2 % (ref 0.0–7.0)
Eosinophils Absolute: 0.4 10*3/uL (ref 0.0–0.5)
HCT: 35.6 % (ref 34.8–46.6)
HGB: 11.8 g/dL (ref 11.6–15.9)
LYMPH%: 15.6 % (ref 14.0–49.7)
MCH: 30.2 pg (ref 25.1–34.0)
MCHC: 33.2 g/dL (ref 31.5–36.0)
MCV: 91.2 fL (ref 79.5–101.0)
MONO#: 1.4 10*3/uL — ABNORMAL HIGH (ref 0.1–0.9)
MONO%: 12.8 % (ref 0.0–14.0)
NEUT#: 7.7 10*3/uL — ABNORMAL HIGH (ref 1.5–6.5)
NEUT%: 67.8 % (ref 38.4–76.8)
Platelets: 347 10*3/uL (ref 145–400)
RBC: 3.91 10*6/uL (ref 3.70–5.45)
RDW: 14.3 % (ref 11.2–14.5)
WBC: 11.3 10*3/uL — AB (ref 3.9–10.3)
lymph#: 1.8 10*3/uL (ref 0.9–3.3)

## 2016-12-04 LAB — COMPREHENSIVE METABOLIC PANEL
ALT: 96 U/L — AB (ref 0–55)
ANION GAP: 9 meq/L (ref 3–11)
AST: 47 U/L — AB (ref 5–34)
Albumin: 3.7 g/dL (ref 3.5–5.0)
Alkaline Phosphatase: 145 U/L (ref 40–150)
BUN: 23.5 mg/dL (ref 7.0–26.0)
CHLORIDE: 103 meq/L (ref 98–109)
CO2: 28 mEq/L (ref 22–29)
CREATININE: 0.9 mg/dL (ref 0.6–1.1)
Calcium: 10 mg/dL (ref 8.4–10.4)
EGFR: 57 mL/min/{1.73_m2} — ABNORMAL LOW (ref >90)
Glucose: 90 mg/dl (ref 70–140)
Potassium: 4.1 mEq/L (ref 3.5–5.1)
SODIUM: 139 meq/L (ref 136–145)
Total Bilirubin: 0.43 mg/dL (ref 0.20–1.20)
Total Protein: 7.5 g/dL (ref 6.4–8.3)

## 2016-12-04 NOTE — Telephone Encounter (Signed)
Scheduled appts per 4/25 los. Gave patient AVS and calender per 4/25 los.

## 2016-12-04 NOTE — Progress Notes (Signed)
[facility]  [phone] Fax:([phone]) [phone]  Clinic Follow Up Note   Patient Care Team: Moshe Cipro, MD as PCP - General (Internal Medicine) Alexis Frock, MD as Consulting Physician (Urology) Michael Boston, MD as Consulting Physician (General Surgery) Toma Deiters, MD as Referring Physician (Gastroenterology) 12/04/2016  CHIEF COMPLAINTS:  Sigmoid colon Adenocarcinoma  Oncology History   Cancer Staging Cancer of left colon [facility]) Staging form: Colon and Rectum, AJCC 8th Edition - Clinical stage from 09/04/2016: Stage IVB (cTX, cNX, pM1b) - Signed by Truitt Merle, MD on 11/12/2016       Cancer of left colon ([place])   09/02/2016 Procedure    Colonoscopy Diverticulosis (scattered). Pt has about 10 cm structure starting at 20 cm with abnormal mucosa. Biopsies taken.       09/02/2016 Pathology Results    MODERATELY DIFFERENTIATED ADENOCARCINOMA OF COLON AT 20 CM      09/04/2016 Initial Diagnosis    Cancer of left colon ([place])      09/06/2016 Imaging    CT CAP w Contrast 1. No acute abdominopelvic process.  2. Heterogenous hypodense area involving the medial edge of the left kidney with multiple small soft tissue like stranding involving the left pararenal space. Findings are suspicious for malignancy of the left kidney with small pararenal metastases. MRI abdomen with contrast should be considered to further evaluate.  3. Vague hypodensity involving the lateral segment of the liver. This can also be reevaluated with MRI.  4. Partially visualized 5 mm posterior right lower lobe nodular density. CT chest without contrast follow up is recommended.  5. Complex multilobular left ovarian lesion may reflect a cluster of cysts.      10/10/2016 Procedure    Operative Report by Dr. Tresa Moore on 10/18/16 Procedure: 1) Cystoscopy, left retrograde pyelogram interpretation. 2) Left diagnostic uteroscopy. 3) Insertion of left uteral stent; 5 x 24 Polaris with  tether. Findings: 1) Unremarkable bladder. 2) Unremarkable left retrograde pyelogram. 3) No evidence of intraluminal urothelial neoplasm whatsoever with insepction of the left kidney and ureter times several. 4) Successful placement of left ureteral stent, proximal in renal pelvis and distal in urinary bladder.      10/15/2016 Imaging    MRI abdomen pelvis 1. There are 2 enhancing hepatic lesions highly worrisome for metastatic disease, likely metastatic colon cancer. 2. The lesion of concern in the anterior suprahilar lip of the left kidney is also enhancing, worrisome for neoplasm. This has a somewhat atypical appearance for renal cell carcinoma and could reflect a metastasis as well. 3. New left-sided hydroureter with delayed contrast excretion consistent with distal ureteral obstruction. Specific etiology uncertain. 4. Complex cystic and solid left adnexal lesion could reflect cystic neoplasm, although does not appear aggressive or appear to reflect the cause of the distal left ureteral obstruction. 5. Sigmoid colon wall thickening could be related to reported newly diagnosed colon cancer.      11/06/2016 PET scan    PET 11/06/2016 IMPRESSION: 1. Hypermetabolic pulmonary and hepatic lesions, most consistent with metastatic colon cancer, with the hypermetabolic primary located in the sigmoid colon. 2. Left renal and left adnexal lesions, better seen and evaluated on 10/15/2016. 3. Persistent moderate left hydronephrosis. 4. Aortic atherosclerosis (ICD10-170.0).      11/11/2016 Pathology Results    Liver, needle/core biopsy, left lobe - METASTATIC ADENOCARCINOMA. The histologic features are consistent with a primary gastrointestinal adenocarcinoma.      HISTORY OF PRESENTING ILLNESS (10/25/2016):  Mary [name] 83 y.o. female is here  because of adenocarcinoma of the colon and a left renal mass. The pt noticed worsening episodes of abdominal cramping and nausea with bowel  movements in July 2017. She had an abdominal US performed, which was unremarkable. A colonoscopy was then performed at the patient's request, which found a mass about 20 cm from the anal verge. Biopsy showed moderately differentiated adenocarcinoma. MRI done, with a lesion noticed in the left kidney near the pelvis, a 11 mm hepatic lesion, 4 mm lung nodule, and possible cystic left ovarian mass. Pt saw Dr. Johney Maine for information on abdominal surgery for her colon, which she declined. She also saw Dr. Lorna Dibble, who is considering a nephrectomy, but is planning a sternoscopy to better characterize the kidney lesion. She presents today for continued treatment.    She presents today with her son and daughter-in-law. The symptoms started in late July 2017. She had some vomiting, abdominal pain, and constipation, but not bleeding. Her PCP never found any anemia. Initially she lost her appetite, but now she is taking multiple vitamins and supplements, so her appetite is back. Her bowel has improved, but she still has abdominal pain that comes and goes, lasting minutes. The pain is tolerable with aleve. She did not tolerate Tramadol well; nausea and vomiting. She has some occasional left sides back pain. Denies weight loss, or any other concerns. She is able to do everything she needs to around the house.   She had a partial hysterectomy, and a lumpectomy. She has a history of HTN, atrial fibrillation, high cholesterol. She has some hearing loss and can not see out of her right eye. Not a diabetic, but her son and her mother are. No family history of colon cancer. Her sister had breast cancer in her 55's and her brother had lung cancer in his 79's. Never smoker. Doesn't drink alcohol. 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 She has two sons; one lives an hour away, and the other lives in [place]. She lives alone.   CURRENT THERAPY: Xeloda and Panitumumab PENDING   INTERVAL HISTORY: Mary [name] presents in the  clinic today with her daughter-in-law for a follow up. She has constipation for which she is taking Miralax and prune/tomato juice. She is fatigued. Her cardiologist wants her to undergo cardio inversion and anticoagulation, but the patient would like advice from medical oncology first. She is on baby Aspirin. She is able to keep food down and is eating an iron diet. She is not on oral iron due to constipation from this. She reports that she has headaches and chest discomfort from Tylenol #3. She has occasional nausea.  MEDICAL HISTORY:  Past Medical History:  Diagnosis Date  . Aneurysm of ascending aorta ([facility]) 12/03/2016   3.7 cm by echo 10/2016  . Colon cancer [facility])    dx via biospy from colonoscopy-- malignant ---  currently having work-up done w/ dr gross (general surgeon) and coordinate surgery w/ urologsit for renal tumor  . Dysrhythmia    A  Fib  . Essential hypertension 10/31/2016  . GERD (gastroesophageal reflux disease)   . Glaucoma   . Hiatal hernia   . HOH (hard of hearing)    bilateral even w/ hearing aids  . Hyperlipidemia   . Hypertension   . Kidney tumor    left side  . Legally blind in right eye, as defined in [place]    due to macular degeneration  . Macular degeneration   . Nausea    intermittant due to colon  mass  . Ovarian cyst   . Persistent atrial fibrillation ([place]) 10/31/2016  . PONV (postoperative nausea and vomiting)   . Wears glasses   . Wears hearing aid    bilateral    SURGICAL HISTORY: Past Surgical History:  Procedure Laterality Date  . BREAST CYST EXCISION  1990   benign  . COLONOSCOPY  09/04/2016  . CYSTOSCOPY WITH RETROGRADE PYELOGRAM, URETEROSCOPY AND STENT PLACEMENT Left 10/10/2016   Procedure: CYSTOSCOPY WITH RETROGRADE PYELOGRAM, DIAGNOSTIC URETEROSCOPY  AND STENT PLACEMENT;  Surgeon: Alexis Frock, MD;  Location: [facility];  Service: Urology;  Laterality: Left;  . ESOPHAGOGASTRODUODENOSCOPY  11/07/2014  . NASAL SINUS SURGERY     . TONSILLECTOMY      SOCIAL HISTORY: Social History   Social History  . Marital status: Unknown    Spouse name: N/A  . Number of children: N/A  . Years of education: N/A   Occupational History  . Not on file.   Social History Main Topics  . Smoking status: Never Smoker  . Smokeless tobacco: Never Used  . Alcohol use No  . Drug use: No  . Sexual activity: Not on file   Other Topics Concern  . Not on file   Social History Narrative  . No narrative on file    FAMILY HISTORY: Family History  Problem Relation Age of Onset  . Cancer Sister 33    breast cancer   . Cancer Brother 31    lung cancer   . Diabetes Son   . High blood pressure Son   . Peripheral Artery Disease Mother   . High blood pressure Mother     ALLERGIES:  is allergic to tramadol and bactrim [sulfamethoxazole-trimethoprim].  MEDICATIONS:  Current Outpatient Prescriptions  Medication Sig Dispense Refill  . acetaminophen-codeine (TYLENOL #3) 300-30 MG tablet Take 1 tablet by mouth every 4 (four) hours as needed for moderate pain. 20 tablet 0  . alendronate (FOSAMAX) 70 MG tablet Take 70 mg by mouth once a week. Take with a full glass of water on an empty stomach.    Marland Kitchen amLODipine (NORVASC) 5 MG tablet Take 5 mg by mouth daily.    Marland Kitchen aspirin EC 81 MG tablet Take 81 mg by mouth daily.    Marland Kitchen atorvastatin (LIPITOR) 10 MG tablet Take 10 mg by mouth daily.    . benazepril-hydrochlorthiazide (LOTENSIN HCT) 20-12.5 MG tablet Take 1 tablet by mouth daily.    . Cholecalciferol (VITAMIN D3) 5000 units CAPS Take 1 capsule by mouth daily.    Marland Kitchen diltiazem (CARDIZEM CD) 240 MG 24 hr capsule Take 1 capsule (240 mg total) by mouth daily. 30 capsule 5  . escitalopram (LEXAPRO) 10 MG tablet Take 10 mg by mouth daily.    . fexofenadine (ALLEGRA) 180 MG tablet Take 180 mg by mouth daily.    Marland Kitchen latanoprost (XALATAN) 0.005 % ophthalmic solution Place 1 drop into both eyes at bedtime.    . Multiple Vitamins-Minerals (VITEYES  AREDS ADVANCED) CAPS Take 1 capsule by mouth daily.    Marland Kitchen omeprazole (PRILOSEC) 40 MG capsule Take 40 mg by mouth daily.    Vladimir Faster Glycol-Propyl Glycol (SYSTANE) 0.4-0.3 % SOLN Apply to eye as needed.     No current facility-administered medications for this visit.     REVIEW OF SYSTEMS:   Constitutional: Denies fevers, chills or abnormal night sweats (+) Fatigue Eyes: Denies blurriness of vision, double vision or watery eyes Ears, nose, mouth, throat, and face: Denies mucositis or  sore throat Respiratory: Denies cough, dyspnea or wheezes Cardiovascular: Denies palpitation, chest discomfort or lower extremity swelling Gastrointestinal:  Denies heartburn (+) abdominal pain/cramping  (+) Constipation (+) occasional nausea Skin: Denies abnormal skin rashes Lymphatics: Denies new lymphadenopathy or easy bruising Neurological:Denies numbness, tingling or new weaknesses Behavioral/Psych: Mood is stable, no new changes  All other systems were reviewed with the patient and are negative.  PHYSICAL EXAMINATION: ECOG PERFORMANCE STATUS: 1 - Symptomatic but completely ambulatory  Vitals:   12/04/16 1551  BP: 128/61  Pulse: 80  Resp: 17  Temp: 98.8 F (37.1 C)   Filed Weights   12/04/16 1551  Weight: 182 lb (82.6 kg)   GENERAL:alert, no distress and comfortable SKIN: skin color, texture, turgor are normal, no rashes or significant lesions EYES: normal, conjunctiva are pink and non-injected, sclera clear OROPHARYNX:no exudate, no erythema and lips, buccal mucosa, and tongue normal  NECK: supple, thyroid normal size, non-tender, without nodularity LYMPH:  no palpable lymphadenopathy in the cervical, axillary or inguinal LUNGS: clear to auscultation and percussion with normal breathing effort HEART: regular rate & rhythm and no murmurs and no lower extremity edema  ABDOMEN:abdomen soft, non-tender and normal bowel sounds Musculoskeletal:no cyanosis of digits and no clubbing  PSYCH:  alert & oriented x 3 with fluent speech NEURO: no focal motor/sensory deficits   LABORATORY DATA:  I have reviewed the data as listed CBC Latest Ref Rng & Units 12/04/2016 11/11/2016 10/31/2016  WBC 3.9 - 10.3 10e3/uL 11.3(H) 9.3 9.3  Hemoglobin 11.6 - 15.9 g/dL 11.8 11.8(L) 12.3  Hematocrit 34.8 - 46.6 % 35.6 35.7(L) 37.9  Platelets 145 - 400 10e3/uL 347 336 399   CMP Latest Ref Rng & Units 12/04/2016 11/11/2016 10/31/2016  Glucose 70 - 140 mg/dl 90 97 99  BUN 7.0 - 26.0 mg/dL 23.5 21(H) 15.9  Creatinine 0.6 - 1.1 mg/dL 0.9 0.81 0.9  Sodium 136 - 145 mEq/L 139 140 142  Potassium 3.5 - 5.1 mEq/L 4.1 3.9 4.6  Chloride 101 - 111 mmol/L - 106 -  CO2 22 - 29 mEq/L 28 27 28   Calcium 8.4 - 10.4 mg/dL 10.0 9.5 9.9  Total Protein 6.4 - 8.3 g/dL 7.5 7.7 7.6  Total Bilirubin 0.20 - 1.20 mg/dL 0.43 0.8 0.60  Alkaline Phos 40 - 150 U/L 145 75 94  AST 5 - 34 U/L 47(H) 19 16  ALT 0 - 55 U/L 96(H) 14 15   PATHOLOGY: [facility] 11/11/16   Diagnosis 11/11/16 Liver, needle/core biopsy, left lobe - METASTATIC ADENOCARCINOMA. - SEE COMMENT. Microscopic Comment The histologic features are consistent with a primary gastrointestinal adenocarcinoma. Dr. Vicente Males has reviewed the case and concurs with this interpretation. There is likely sufficient tumor present for additional studies, if requested. Enid Cutter MD Pathologist, Electronic Signature (Case signed 11/12/2016)  Diagnosis 09/04/2016 MODERATELY DIFFERENTIATED ADENOCARCINOMA OF COLON AT 20 CM Tumor Site: Colon at 20 cm Tumor Type: Adenocarcinoma Tumore Size: Unknown  Grade: Moderately differentiated   RADIOGRAPHIC STUDIES: I have personally reviewed the radiological images as listed and agreed with the findings in the report.  PET 11/06/2016 IMPRESSION: 1. Hypermetabolic pulmonary and hepatic lesions, most consistent with metastatic colon cancer, with the hypermetabolic primary located in the sigmoid colon. 2. Left renal and left  adnexal lesions, better seen and evaluated on 10/15/2016. 3. Persistent moderate left hydronephrosis. 4. Aortic atherosclerosis (ICD10-170.0).  MRI Abdomen Pelvis w/wo Contrast 10/15/2016 IMPRESSION: 1. There are 2 enhancing hepatic lesions highly worrisome for metastatic disease, likely metastatic colon cancer.  2. The lesion of concern in the anterior suprahilar lip of the left kidney is also enhancing, worrisome for neoplasm. This has a somewhat atypical appearance for renal cell carcinoma and could reflect a metastasis as well. 3. New left-sided hydroureter with delayed contrast excretion consistent with distal ureteral obstruction. Specific etiology uncertain. 4. Complex cystic and solid left adnexal lesion could reflect cystic neoplasm, although does not appear aggressive or appear to reflect the cause of the distal left ureteral obstruction. 5. Sigmoid colon wall thickening could be related to reported newly diagnosed colon cancer.  CT CAP w Contrast 09/06/2016 (done outside) IMPRESSION: 1. No acute abdominopelvic process.  2. Heterogenous hypodense area involving the medial edge of the left kidney with multiple small soft tissue like stranding involving the left pararenal space. Findings are suspicious for malignancy of the left kidney with small pararenal metastases. MRI abdomen with contrast should be considered to further evaluate.  3. Vague hypodensity involving the lateral segment of the liver. This can also be reevaluated with MRI.  4. Partially visualized 5 mm posterior right lower lobe nodular density. CT chest without contrast follow up is recommended.  5. Complex multilobular left ovarian lesion may reflect a cluster of cysts.   Colonoscopy 09/02/2016 FINDINGS: Diverticulosis (scattered). Pt has about 10 cm structure starting at 20 cm with abnormal mucosa. The colonoscopy scope was not able to advance due to structure, and upper endoscopy scope was used and it was  advanced through the stricture to see ileocecal valve. Biopsies at the stricture were taken.   PROCEDURES Echocardiogram 11/03/16 - Left ventricle: The cavity size was normal. Wall thickness was   increased in a pattern of mild LVH. Indeterminant diastolic   function (atrial fibrillation). Systolic function was normal. The   estimated ejection fraction was in the range of 60% to 65%.   Operative Report by Dr. Tresa Moore on 10/10/16 Procedure: 1) Cystoscopy, left retrograde pyelogram interpretation. 2) Left diagnostic uteroscopy. 3) Insertion of left uteral stent; 5 x 24 Polaris with tether. Findings: 1) Unremarkable bladder. 2) Unremarkable left retrograde pyelogram. 3) No evidence of intraluminal urothelial neoplasm whatsoever with insepction of the left kidney and ureter times several. 4) Successful placement of left ureteral stent, proximal in renal pelvis and distal in urinary bladder.  ASSESSMENT & PLAN:  Mary [name] is a 83 y.o. female   1. Adenocarcinoma of left colon, sigmoid colon, TxNxM1, stage IV, KRAS/NRS wild type, MSI-stable  -I discussed the imaging, colonoscopy and biopsy results with the patient and her family in detail.  -She has seen Drs. Manny and Gross, surgical resections were discussed.  -PET scan on 11/06/16 showed hypermetabolic pulmonary and hepatic lesions consistent with metastatic colon cancer with the primary located in the sigmoid colon. I reviewed the images with pt and her family members in person  -US biopsy on 11/11/16 of the left lobe of the liver revealed metastatic adenocarcinoma. The features are consistent with a primary gastrointestinal adenocarcinoma. I discussed with pt  -We previously discussed that her metastatic cancer is incurable at this stage due to the multiorgan metastasis, and her advanced age. The goal of therapy is palliative. -We discussed the indication for surgery, including significant bleeding, obstruction, or perforation. She does have mild  constipation and intermittent abdominal cramps, but no significant bowel obstruction. Patient understands the surgery is palliative. She prefers to avoid surgery.  -We reviewed [facility] test results, which showed MSI- stable, no KRAS/NRAS or BRAF mutations. Based on this, she is not a candidate for  immunotherapy, but she will likely benefit from EGFR inhibitor, giving the left side colon cancer. -The patient's case was discussed in GI tumor board. Surgery would like to perform coronal stenting to prevent obstruction, but her symptoms at this time are not severe enough to warrant this and she is worried about possible complications from surgery. -The [facility] results showed she is not a candidate for immunotherapy, but she is a candidate for antibody therapy. -We discussed that the prognosis of left side colon cancer without KRAS/NRAS and BRAF mutations is much better than right side colon cancer, especially with EGFR antibody treatment. -We discussed the options of systemic therapy. Due to her advanced age, she is not a candidate for intensive chemotherapy. I recommend that panitumumab, an EGFR inhibitor with low intensity chemo (5-FU infusion or Xeloda to control her cancer. -We discussed alternative management options with palliative care alone, which will be also appropriate given her advanced age and medical comorbidities.  -Due to more symptoms caused by IV chemotherapy, the requirement of an IV pump to be placed/removed, and the patient living in [place], [place], the patient would like to proceed with Xeloda with Panitumumab. I offered the patient for a referral to [facility], which is closer to her, but she declined.  --Chemotherapy consent: Side effects including but does not not limited to, fatigue, nausea, vomiting, diarrhea, hair loss, neuropathy, fluid retention, renal and kidney dysfunction, neutropenic fever, needed for blood transfusion, bleeding, skin rashes, were discussed  with patient in great detail. She agreed to proceed. -The goal of therapy is palliative. -Patient will  proceed with Xeloda 866m/m2 BID 2 weeks on and 1 week off with Panitumumab once every 2 weeks. -We will tentatively start treatment the week of 12/24/16. -She takes MiraLax to help her bowel movements. She also changed her diet and takes fiber supplements. Her abdominal pain and constipation has improved lately.  2. Left renal mass and left hydronephrosis -Possible renal cell carcinoma, metastatic lesion from colon cancer is also a possibility. -She has been seen by GU, Dr. MTresa Moore and had left ureter stent placement on 10/10/2016.  3. Complex cyst of left ovary -will monitor. Likely benign -If she undergo abdominal surgery, may remove it   4. Atrial Fibrillation -The patient has a history of atrial fibrillation. -Her cardiologist wants her to undergo cardio inversion and anticoagulation. -I advised the patient that even if she undergoes cardio inversion, her Afib might return. Also, her anticoagulation medication might make her bleeding worse. -The patient should continue taking baby aspirin.  5. HTN -Continue medications and follow up with primary care physician  6. Nutrition -We discussed palliative chemotherapy will make her fatigued, nauseous, and loss of appetite. -I stressed the importance of a healthy diet with plenty of protein. -I will make a referral to Nutrition.  7. Left flank and abdominal pain -The patient is taking Aleve for pain. -I prescribed Tylenol #3 on 11/13/16. -The patient discontinued Tylenol #3 due to chest discomfort and headaches. -she may try tramadol   8. Anemia -The patient's Hb is 11.8 on 4.4 and has decreased from 12.3 on 10/31/16 and 13.9 on 10/10/16. This is blood loss from her tumor. -I advised the patient to take an baby dose iron pills and eat iron rich foods. -She is to drink plenty of fluids to prevent constipation.  9. Goal of care  discussion  -We again discussed the incurable nature of her cancer, and the overall poor prognosis, especially if she does not have  good response to chemotherapy or progress on chemo -The patient understands the goal of care is palliative. -I recommend DNR/DNI, she will think about it   Plan - FO results discussed -Nutrition consult and chemo class the week of 12/24/16 in the morning. -Lab, f/u, and chemo Panitumumab the afternoon of her nutrition consult and chemo class. -I will prescribe Xeloda for her, to be start on 5/15. Due to her advanced age, she will start on 1554m (around 8242mm2) bid for 2 weeks on, one week off for the first cycle  -her case was presented in GI tumor board -I will send messages to Dr. GrJohney Mainend MaTresa Moore All questions were answered. The patient knows to call the clinic with any problems, questions or concerns.  Post patient and her family members had many questions, I answered to their satisfaction. I spent 55 minutes counseling the patient face to face. The total time spent in the appointment was 70 minutes and more than 50% was on counseling.   FeTruitt MerleMD 12/04/2016   This document serves as a record of services personally performed by YaTruitt MerleMD. It was created on her behalf by JaDarcus Austina trained medical scribe. The creation of this record is based on the scribe's personal observations and the provider's statements to them. This document has been checked and approved by the attending provider.

## 2016-12-05 ENCOUNTER — Encounter: Payer: Self-pay | Admitting: Hematology

## 2016-12-05 DIAGNOSIS — Z7189 Other specified counseling: Secondary | ICD-10-CM | POA: Insufficient documentation

## 2016-12-05 MED ORDER — CAPECITABINE 500 MG PO TABS: 850.0000 mg/m2 | ORAL_TABLET | Freq: Two times a day (BID) | ORAL | 0 refills | Status: DC

## 2016-12-05 NOTE — Progress Notes (Signed)
START ON PATHWAY REGIMEN - Colorectal     A cycle is every 21 days:     Capecitabine   **Always confirm dose/schedule in your pharmacy ordering system**    Patient Characteristics: Metastatic Colorectal, First Line, Nonsurgical Candidate, KRAS/NRAS Wild-Type, BRAF Wild-Type/Unknown, PS > 1; Bevacizumab Ineligible Current evidence of distant metastases? Yes AJCC T Category: [place] AJCC N Category: NX AJCC M Category: M1b AJCC 8 Stage Grouping: IVB BRAF Mutation Status: Wild Type (no mutation) KRAS/NRAS Mutation Status: Wild Type (no mutation) Line of therapy: First Line Would you be surprised if this patient died  in the next year? I would be surprised if this patient died in the next year Performance Status: PS > 1 Bevacizumab Eligibility: Ineligible  Intent of Therapy: Non-Curative / Palliative Intent, Discussed with Patient

## 2016-12-06 ENCOUNTER — Ambulatory Visit: Payer: Medicare Other | Admitting: Hematology

## 2016-12-06 MED ORDER — CAPECITABINE 500 MG PO TABS: 850.0000 mg/m2 | ORAL_TABLET | Freq: Two times a day (BID) | ORAL | 0 refills | Status: DC

## 2016-12-10 ENCOUNTER — Telehealth: Payer: Self-pay | Admitting: *Deleted

## 2016-12-10 ENCOUNTER — Telehealth: Payer: Self-pay | Admitting: Pharmacist

## 2016-12-10 NOTE — Telephone Encounter (Signed)
OK with me, please cancel her chemo appointment then.   Jess, you may cancel her Xeloda scrip then.   Truitt Merle MD

## 2016-12-10 NOTE — Telephone Encounter (Signed)
Oral Chemotherapy Pharmacist Encounter  Received notification from MD that patient has decided against further treatments. Xeloda prescription will be cancelled. No further needs from [facility] identified at this time. [facility] will sign off. Please let us know if we can be of assistance in the future.   Johny Drilling, PharmD, BCPS, BCOP 12/10/2016  2:40 PM [facility] [phone]

## 2016-12-10 NOTE — Telephone Encounter (Signed)
Received call from daughter in-law [name] wanting to let Dr. Burr Medico know re:  Pt has decided  NO Chemo Treatments today.   Colletta Maryland stated pt has had second thought about receiving treatment and feeling sicker with side effects of chemo. Colletta Maryland requested chemo appts to be cancelled; however, pt will keep lab, office visit, and to see dietitian on 5/16 as scheduled.  Colletta Maryland will be coming with pt to the visit. [name]'s   Phone      [phone].

## 2016-12-23 ENCOUNTER — Telehealth: Payer: Self-pay | Admitting: Hematology

## 2016-12-23 ENCOUNTER — Other Ambulatory Visit: Payer: Medicare Other

## 2016-12-23 NOTE — Telephone Encounter (Signed)
Patients daughter in law called and said that she received a call about the appointments on Wednesday for chemo ed and an infusion.  She said that Mary [name] did not want to do treatment so I cancelled the chemo edu and infusion on Wednesday

## 2016-12-24 ENCOUNTER — Other Ambulatory Visit: Payer: Self-pay | Admitting: *Deleted

## 2016-12-24 DIAGNOSIS — C186 Malignant neoplasm of descending colon: Secondary | ICD-10-CM

## 2016-12-25 ENCOUNTER — Telehealth: Payer: Self-pay | Admitting: *Deleted

## 2016-12-25 ENCOUNTER — Other Ambulatory Visit: Payer: Medicare Other

## 2016-12-25 ENCOUNTER — Encounter: Payer: Self-pay | Admitting: Hematology

## 2016-12-25 ENCOUNTER — Ambulatory Visit: Payer: Medicare Other | Admitting: Nutrition

## 2016-12-25 ENCOUNTER — Ambulatory Visit (HOSPITAL_BASED_OUTPATIENT_CLINIC_OR_DEPARTMENT_OTHER): Payer: Medicare Other | Admitting: Hematology

## 2016-12-25 ENCOUNTER — Other Ambulatory Visit: Payer: Self-pay | Admitting: *Deleted

## 2016-12-25 ENCOUNTER — Ambulatory Visit (HOSPITAL_COMMUNITY)
Admission: RE | Admit: 2016-12-25 | Discharge: 2016-12-25 | Disposition: A | Discharge status: Home / Self Care | Payer: Medicare Other | Source: Ambulatory Visit | Attending: Internal Medicine | Admitting: Internal Medicine

## 2016-12-25 ENCOUNTER — Ambulatory Visit: Payer: Medicare Other

## 2016-12-25 ENCOUNTER — Other Ambulatory Visit (HOSPITAL_BASED_OUTPATIENT_CLINIC_OR_DEPARTMENT_OTHER): Payer: Medicare Other

## 2016-12-25 ENCOUNTER — Telehealth: Payer: Self-pay | Admitting: Hematology

## 2016-12-25 VITALS — BP 136/62 | HR 89 | Temp 98.6°F | Resp 18 | Ht 64.0 in | Wt 181.3 lb

## 2016-12-25 DIAGNOSIS — C7800 Secondary malignant neoplasm of unspecified lung: Secondary | ICD-10-CM

## 2016-12-25 DIAGNOSIS — I4891 Unspecified atrial fibrillation: Secondary | ICD-10-CM | POA: Diagnosis not present

## 2016-12-25 DIAGNOSIS — N289 Disorder of kidney and ureter, unspecified: Secondary | ICD-10-CM

## 2016-12-25 DIAGNOSIS — R109 Unspecified abdominal pain: Secondary | ICD-10-CM

## 2016-12-25 DIAGNOSIS — C186 Malignant neoplasm of descending colon: Secondary | ICD-10-CM | POA: Insufficient documentation

## 2016-12-25 DIAGNOSIS — D649 Anemia, unspecified: Secondary | ICD-10-CM | POA: Diagnosis not present

## 2016-12-25 DIAGNOSIS — C787 Secondary malignant neoplasm of liver and intrahepatic bile duct: Secondary | ICD-10-CM

## 2016-12-25 DIAGNOSIS — R609 Edema, unspecified: Secondary | ICD-10-CM | POA: Diagnosis not present

## 2016-12-25 DIAGNOSIS — N1330 Unspecified hydronephrosis: Secondary | ICD-10-CM | POA: Diagnosis not present

## 2016-12-25 DIAGNOSIS — N83202 Unspecified ovarian cyst, left side: Secondary | ICD-10-CM

## 2016-12-25 DIAGNOSIS — I10 Essential (primary) hypertension: Secondary | ICD-10-CM

## 2016-12-25 DIAGNOSIS — I4819 Other persistent atrial fibrillation: Secondary | ICD-10-CM

## 2016-12-25 LAB — COMPREHENSIVE METABOLIC PANEL
ALBUMIN: 3.8 g/dL (ref 3.5–5.0)
ALK PHOS: 94 U/L (ref 40–150)
ALT: 13 U/L (ref 0–55)
ANION GAP: 9 meq/L (ref 3–11)
AST: 17 U/L (ref 5–34)
BILIRUBIN TOTAL: 0.55 mg/dL (ref 0.20–1.20)
BUN: 17.9 mg/dL (ref 7.0–26.0)
CO2: 28 mEq/L (ref 22–29)
Calcium: 9.6 mg/dL (ref 8.4–10.4)
Chloride: 104 mEq/L (ref 98–109)
Creatinine: 0.9 mg/dL (ref 0.6–1.1)
EGFR: 64 mL/min/{1.73_m2} — AB (ref >90)
Glucose: 116 mg/dl (ref 70–140)
Potassium: 4.1 mEq/L (ref 3.5–5.1)
SODIUM: 140 meq/L (ref 136–145)
TOTAL PROTEIN: 7.8 g/dL (ref 6.4–8.3)

## 2016-12-25 LAB — CBC WITH DIFFERENTIAL/PLATELET
BASO%: 0.8 % (ref 0.0–2.0)
Basophils Absolute: 0.1 10*3/uL (ref 0.0–0.1)
EOS%: 3.7 % (ref 0.0–7.0)
Eosinophils Absolute: 0.4 10*3/uL (ref 0.0–0.5)
HCT: 35.8 % (ref 34.8–46.6)
HGB: 12.1 g/dL (ref 11.6–15.9)
LYMPH%: 15.1 % (ref 14.0–49.7)
MCH: 30.5 pg (ref 25.1–34.0)
MCHC: 33.7 g/dL (ref 31.5–36.0)
MCV: 90.6 fL (ref 79.5–101.0)
MONO#: 1.2 10*3/uL — ABNORMAL HIGH (ref 0.1–0.9)
MONO%: 12.8 % (ref 0.0–14.0)
NEUT%: 67.6 % (ref 38.4–76.8)
NEUTROS ABS: 6.3 10*3/uL (ref 1.5–6.5)
Platelets: 307 10*3/uL (ref 145–400)
RBC: 3.95 10*6/uL (ref 3.70–5.45)
RDW: 14.2 % (ref 11.2–14.5)
WBC: 9.4 10*3/uL (ref 3.9–10.3)
lymph#: 1.4 10*3/uL (ref 0.9–3.3)

## 2016-12-25 LAB — MAGNESIUM: Magnesium: 2.5 mg/dl (ref 1.5–2.5)

## 2016-12-25 MED ORDER — ONDANSETRON HCL 8 MG PO TABS: 8.0000 mg | ORAL_TABLET | Freq: Three times a day (TID) | ORAL | 2 refills | Status: DC | PRN

## 2016-12-25 MED ORDER — TRAMADOL HCL 50 MG PO TABS: 50.0000 mg | ORAL_TABLET | Freq: Four times a day (QID) | ORAL | 1 refills | Status: DC | PRN

## 2016-12-25 NOTE — Telephone Encounter (Signed)
NO LOS per 5/16

## 2016-12-25 NOTE — Progress Notes (Signed)
**  Preliminary report by tech**  Left lower extremity venous duplex complete.  There is no evidence of deep or superficial vein thrombosis involving the left lower extremity. All visualized vessels appear patent and compressible. There is no evidence of a Baker's cyst on the left. Results were given to [facility] at Dr. Ernestina Penna office.  12/25/16 3:34 PM Mary [name] RVT

## 2016-12-25 NOTE — Progress Notes (Signed)
[place]  [phone] [phone]  Clinic Follow Up Note   Patient Care Team: Moshe Cipro, MD as PCP - General (Internal Medicine) Alexis Frock, MD as Consulting Physician (Urology) Michael Boston, MD as Consulting Physician (General Surgery) Toma Deiters, MD as Referring Physician (Gastroenterology) 12/25/2016  CHIEF COMPLAINTS:  Metastatic sigmoid colon Adenocarcinoma  Oncology History   Cancer Staging Cancer of left colon [facility]) Staging form: Colon and Rectum, AJCC 8th Edition - Clinical stage from 09/04/2016: Stage IVB (cTX, cNX, pM1b) - Signed by Truitt Merle, MD on 11/12/2016       Cancer of left colon ([place])   09/02/2016 Procedure    Colonoscopy Diverticulosis (scattered). Pt has about 10 cm structure starting at 20 cm with abnormal mucosa. Biopsies taken.       09/02/2016 Pathology Results    MODERATELY DIFFERENTIATED ADENOCARCINOMA OF COLON AT 20 CM      09/04/2016 Initial Diagnosis    Cancer of left colon ([place])      09/06/2016 Imaging    CT CAP w Contrast 1. No acute abdominopelvic process.  2. Heterogenous hypodense area involving the medial edge of the left kidney with multiple small soft tissue like stranding involving the left pararenal space. Findings are suspicious for malignancy of the left kidney with small pararenal metastases. MRI abdomen with contrast should be considered to further evaluate.  3. Vague hypodensity involving the lateral segment of the liver. This can also be reevaluated with MRI.  4. Partially visualized 5 mm posterior right lower lobe nodular density. CT chest without contrast follow up is recommended.  5. Complex multilobular left ovarian lesion may reflect a cluster of cysts.      10/10/2016 Procedure    Operative Report by Dr. Tresa Moore on 10/18/16 Procedure: 1) Cystoscopy, left retrograde pyelogram interpretation. 2) Left diagnostic uteroscopy. 3) Insertion of left uteral stent; 5 x 24 Polaris  with tether. Findings: 1) Unremarkable bladder. 2) Unremarkable left retrograde pyelogram. 3) No evidence of intraluminal urothelial neoplasm whatsoever with insepction of the left kidney and ureter times several. 4) Successful placement of left ureteral stent, proximal in renal pelvis and distal in urinary bladder.      10/15/2016 Imaging    MRI abdomen pelvis 1. There are 2 enhancing hepatic lesions highly worrisome for metastatic disease, likely metastatic colon cancer. 2. The lesion of concern in the anterior suprahilar lip of the left kidney is also enhancing, worrisome for neoplasm. This has a somewhat atypical appearance for renal cell carcinoma and could reflect a metastasis as well. 3. New left-sided hydroureter with delayed contrast excretion consistent with distal ureteral obstruction. Specific etiology uncertain. 4. Complex cystic and solid left adnexal lesion could reflect cystic neoplasm, although does not appear aggressive or appear to reflect the cause of the distal left ureteral obstruction. 5. Sigmoid colon wall thickening could be related to reported newly diagnosed colon cancer.      11/06/2016 PET scan    PET 11/06/2016 IMPRESSION: 1. Hypermetabolic pulmonary and hepatic lesions, most consistent with metastatic colon cancer, with the hypermetabolic primary located in the sigmoid colon. 2. Left renal and left adnexal lesions, better seen and evaluated on 10/15/2016. 3. Persistent moderate left hydronephrosis. 4. Aortic atherosclerosis (ICD10-170.0).      11/11/2016 Pathology Results    Liver, needle/core biopsy, left lobe - METASTATIC ADENOCARCINOMA. The histologic features are consistent with a primary gastrointestinal adenocarcinoma.      HISTORY OF PRESENTING ILLNESS (10/25/2016):  Mary [name] 83 y.o. female is  here because of adenocarcinoma of the colon and a left renal mass. The pt noticed worsening episodes of abdominal cramping and nausea with  bowel movements in July 2017. She had an abdominal US performed, which was unremarkable. A colonoscopy was then performed at the patient's request, which found a mass about 20 cm from the anal verge. Biopsy showed moderately differentiated adenocarcinoma. MRI done, with a lesion noticed in the left kidney near the pelvis, a 11 mm hepatic lesion, 4 mm lung nodule, and possible cystic left ovarian mass. Pt saw Dr. Johney Maine for information on abdominal surgery for her colon, which she declined. She also saw Dr. Lorna Dibble, who is considering a nephrectomy, but is planning a sternoscopy to better characterize the kidney lesion. She presents today for continued treatment.    She presents today with her son and daughter-in-law. The symptoms started in late July 2017. She had some vomiting, abdominal pain, and constipation, but not bleeding. Her PCP never found any anemia. Initially she lost her appetite, but now she is taking multiple vitamins and supplements, so her appetite is back. Her bowel has improved, but she still has abdominal pain that comes and goes, lasting minutes. The pain is tolerable with aleve. She did not tolerate Tramadol well; nausea and vomiting. She has some occasional left sides back pain. Denies weight loss, or any other concerns. She is able to do everything she needs to around the house.   She had a partial hysterectomy, and a lumpectomy. She has a history of HTN, atrial fibrillation, high cholesterol. She has some hearing loss and can not see out of her right eye. Not a diabetic, but her son and her mother are. No family history of colon cancer. Her sister had breast cancer in her 31's and her brother had lung cancer in his 60's. Never smoker. Doesn't drink alcohol. 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 She has two sons; one lives an hour away, and the other lives in [place]. She lives alone.   CURRENT THERAPY: Supportive care  INTERVAL HISTORY:  ANNALEE MEYERHOFF presents in the clinic today  with her daughter-in-law for a follow up. She reports to being more fatigue and weakness. She is having trouble walking. Along with feet swelling last week. Now she has swelling in left ankle. She has back pain sometimes. She has nausea. Her son uses Zofran and she would like some as well. She did try tramadol and it id not make her sick.  She feels she does not want to go through the side effects of chemo treatment. She wants to discuss her benefits of chemo treatment. Her main thought is she wants to be strong and do it her way". She does not really want to do it and she is not scared about it. Her daughter-in-law feels whether she has surgery or treatment she is will going to pass on eventually. She wonders with the time she has left is her quality of life going to be good enough. The patient does not want to give up with fighting she just does not want this to occur overall.    MEDICAL HISTORY:  Past Medical History:  Diagnosis Date  . Aneurysm of ascending aorta (HCC) 12/03/2016   3.7 cm by echo 10/2016  . Colon cancer [facility])    dx via biospy from colonoscopy-- malignant ---  currently having work-up done w/ dr gross (general surgeon) and coordinate surgery w/ urologsit for renal tumor  . Dysrhythmia    A  Fib  .  Essential hypertension 10/31/2016  . GERD (gastroesophageal reflux disease)   . Glaucoma   . Hiatal hernia   . HOH (hard of hearing)    bilateral even w/ hearing aids  . Hyperlipidemia   . Hypertension   . Kidney tumor    left side  . Legally blind in right eye, as defined in [place]    due to macular degeneration  . Macular degeneration   . Nausea    intermittant due to colon mass  . Ovarian cyst   . Persistent atrial fibrillation ([place]) 10/31/2016  . PONV (postoperative nausea and vomiting)   . Wears glasses   . Wears hearing aid    bilateral    SURGICAL HISTORY: Past Surgical History:  Procedure Laterality Date  . BREAST CYST EXCISION  1990   benign  . COLONOSCOPY   09/04/2016  . CYSTOSCOPY WITH RETROGRADE PYELOGRAM, URETEROSCOPY AND STENT PLACEMENT Left 10/10/2016   Procedure: CYSTOSCOPY WITH RETROGRADE PYELOGRAM, DIAGNOSTIC URETEROSCOPY  AND STENT PLACEMENT;  Surgeon: Alexis Frock, MD;  Location: [facility];  Service: Urology;  Laterality: Left;  . ESOPHAGOGASTRODUODENOSCOPY  11/07/2014  . NASAL SINUS SURGERY    . TONSILLECTOMY      SOCIAL HISTORY: Social History   Social History  . Marital status: Widowed    Spouse name: N/A  . Number of children: N/A  . Years of education: N/A   Occupational History  . Not on file.   Social History Main Topics  . Smoking status: Never Smoker  . Smokeless tobacco: Never Used  . Alcohol use No  . Drug use: No  . Sexual activity: Not on file   Other Topics Concern  . Not on file   Social History Narrative  . No narrative on file    FAMILY HISTORY: Family History  Problem Relation Age of Onset  . Cancer Sister 30       breast cancer   . Cancer Brother 65       lung cancer   . Diabetes Son   . High blood pressure Son   . Peripheral Artery Disease Mother   . High blood pressure Mother     ALLERGIES:  is allergic to tramadol and bactrim [sulfamethoxazole-trimethoprim].  MEDICATIONS:  Current Outpatient Prescriptions  Medication Sig Dispense Refill  . acetaminophen-codeine (TYLENOL #3) 300-30 MG tablet Take 1 tablet by mouth every 4 (four) hours as needed for moderate pain. 20 tablet 0  . alendronate (FOSAMAX) 70 MG tablet Take 70 mg by mouth once a week. Take with a full glass of water on an empty stomach.    Marland Kitchen amLODipine (NORVASC) 5 MG tablet Take 5 mg by mouth daily.    Marland Kitchen aspirin EC 81 MG tablet Take 81 mg by mouth daily.    Marland Kitchen atorvastatin (LIPITOR) 10 MG tablet Take 10 mg by mouth daily.    . benazepril-hydrochlorthiazide (LOTENSIN HCT) 20-12.5 MG tablet Take 1 tablet by mouth daily.    . Cholecalciferol (VITAMIN D3) 5000 units CAPS Take 1 capsule by mouth daily.    Marland Kitchen  diltiazem (CARDIZEM CD) 240 MG 24 hr capsule Take 1 capsule (240 mg total) by mouth daily. 30 capsule 5  . escitalopram (LEXAPRO) 10 MG tablet Take 10 mg by mouth daily.    . fexofenadine (ALLEGRA) 180 MG tablet Take 180 mg by mouth daily.    Marland Kitchen latanoprost (XALATAN) 0.005 % ophthalmic solution Place 1 drop into both eyes at bedtime.    . Multiple Vitamins-Minerals (VITEYES  AREDS ADVANCED) CAPS Take 1 capsule by mouth daily.    Marland Kitchen omeprazole (PRILOSEC) 40 MG capsule Take 40 mg by mouth daily.    . ondansetron (ZOFRAN) 8 MG tablet Take 1 tablet (8 mg total) by mouth every 8 (eight) hours as needed for nausea. 30 tablet 2  . Polyethyl Glycol-Propyl Glycol (SYSTANE) 0.4-0.3 % SOLN Apply to eye as needed.    . traMADol (ULTRAM) 50 MG tablet Take 1 tablet (50 mg total) by mouth every 6 (six) hours as needed. 60 tablet 1   No current facility-administered medications for this visit.     REVIEW OF SYSTEMS:   Constitutional: Denies fevers, chills or abnormal night sweats (+) Fatigue  Eyes: Denies blurriness of vision, double vision or watery eyes Ears, nose, mouth, throat, and face: Denies mucositis or sore throat Respiratory: Denies cough, dyspnea or wheezes Cardiovascular: Denies palpitation, chest discomfort (+) feet and leg swelling Gastrointestinal:  Denies heartburn (+) abdominal pain/cramping  (+) Constipation (+) occasional nausea Skin: Denies abnormal skin rashes Lymphatics: Denies new lymphadenopathy or easy bruising MSK: (+) muscle weakness in legs and back pain Neurological:Denies numbness, tingling or new weaknesses Behavioral/Psych: Mood is stable, no new changes  All other systems were reviewed with the patient and are negative.  PHYSICAL EXAMINATION: ECOG PERFORMANCE STATUS: 1 - Symptomatic but completely ambulatory  Vitals:   12/25/16 1314  BP: 136/62  Pulse: 89  Resp: 18  Temp: 98.6 F (37 C)   Filed Weights   12/25/16 1314  Weight: 181 lb 4.8 oz (82.2 kg)       GENERAL:alert, no distress and comfortable SKIN: skin color, texture, turgor are normal, no rashes or significant lesions EYES: normal, conjunctiva are pink and non-injected, sclera clear OROPHARYNX:no exudate, no erythema and lips, buccal mucosa, and tongue normal  NECK: supple, thyroid normal size, non-tender, without nodularity LYMPH:  no palpable lymphadenopathy in the cervical, axillary or inguinal LUNGS: clear to auscultation and percussion with normal breathing effort HEART: regular rate & rhythm and no murmurs and no lower extremity edema  ABDOMEN:abdomen soft, non-tender and normal bowel sounds Musculoskeletal:no cyanosis of digits and no clubbing  PSYCH: alert & oriented x 3 with fluent speech NEURO: no focal motor/sensory deficits   LABORATORY DATA:  I have reviewed the data as listed CBC Latest Ref Rng & Units 12/25/2016 12/04/2016 11/11/2016  WBC 3.9 - 10.3 10e3/uL 9.4 11.3(H) 9.3  Hemoglobin 11.6 - 15.9 g/dL 12.1 11.8 11.8(L)  Hematocrit 34.8 - 46.6 % 35.8 35.6 35.7(L)  Platelets 145 - 400 10e3/uL 307 347 336   CMP Latest Ref Rng & Units 12/25/2016 12/04/2016 11/11/2016  Glucose 70 - 140 mg/dl 116 90 97  BUN 7.0 - 26.0 mg/dL 17.9 23.5 21(H)  Creatinine 0.6 - 1.1 mg/dL 0.9 0.9 0.81  Sodium 136 - 145 mEq/L 140 139 140  Potassium 3.5 - 5.1 mEq/L 4.1 4.1 3.9  Chloride 101 - 111 mmol/L - - 106  CO2 22 - 29 mEq/L 28 28 27   Calcium 8.4 - 10.4 mg/dL 9.6 10.0 9.5  Total Protein 6.4 - 8.3 g/dL 7.8 7.5 7.7  Total Bilirubin 0.20 - 1.20 mg/dL 0.55 0.43 0.8  Alkaline Phos 40 - 150 U/L 94 145 75  AST 5 - 34 U/L 17 47(H) 19  ALT 0 - 55 U/L 13 96(H) 14   PATHOLOGY: [facility] 11/11/16   Diagnosis 11/11/16 Liver, needle/core biopsy, left lobe - METASTATIC ADENOCARCINOMA. - SEE COMMENT. Microscopic Comment The histologic features are consistent with a  primary gastrointestinal adenocarcinoma. Dr. Vicente Males has reviewed the case and concurs with this interpretation. There is  likely sufficient tumor present for additional studies, if requested. Enid Cutter MD Pathologist, Electronic Signature (Case signed 11/12/2016)  Diagnosis 09/04/2016 MODERATELY DIFFERENTIATED ADENOCARCINOMA OF COLON AT 20 CM Tumor Site: Colon at 20 cm Tumor Type: Adenocarcinoma Tumore Size: Unknown  Grade: Moderately differentiated   RADIOGRAPHIC STUDIES: I have personally reviewed the radiological images as listed and agreed with the findings in the report.  PET 11/06/2016 IMPRESSION: 1. Hypermetabolic pulmonary and hepatic lesions, most consistent with metastatic colon cancer, with the hypermetabolic primary located in the sigmoid colon. 2. Left renal and left adnexal lesions, better seen and evaluated on 10/15/2016. 3. Persistent moderate left hydronephrosis. 4. Aortic atherosclerosis (ICD10-170.0).  MRI Abdomen Pelvis w/wo Contrast 10/15/2016 IMPRESSION: 1. There are 2 enhancing hepatic lesions highly worrisome for metastatic disease, likely metastatic colon cancer. 2. The lesion of concern in the anterior suprahilar lip of the left kidney is also enhancing, worrisome for neoplasm. This has a somewhat atypical appearance for renal cell carcinoma and could reflect a metastasis as well. 3. New left-sided hydroureter with delayed contrast excretion consistent with distal ureteral obstruction. Specific etiology uncertain. 4. Complex cystic and solid left adnexal lesion could reflect cystic neoplasm, although does not appear aggressive or appear to reflect the cause of the distal left ureteral obstruction. 5. Sigmoid colon wall thickening could be related to reported newly diagnosed colon cancer.  CT CAP w Contrast 09/06/2016 (done outside) IMPRESSION: 1. No acute abdominopelvic process.  2. Heterogenous hypodense area involving the medial edge of the left kidney with multiple small soft tissue like stranding involving the left pararenal space. Findings are suspicious for  malignancy of the left kidney with small pararenal metastases. MRI abdomen with contrast should be considered to further evaluate.  3. Vague hypodensity involving the lateral segment of the liver. This can also be reevaluated with MRI.  4. Partially visualized 5 mm posterior right lower lobe nodular density. CT chest without contrast follow up is recommended.  5. Complex multilobular left ovarian lesion may reflect a cluster of cysts.   Colonoscopy 09/02/2016 FINDINGS: Diverticulosis (scattered). Pt has about 10 cm structure starting at 20 cm with abnormal mucosa. The colonoscopy scope was not able to advance due to structure, and upper endoscopy scope was used and it was advanced through the stricture to see ileocecal valve. Biopsies at the stricture were taken.   PROCEDURES Echocardiogram 11/03/16 - Left ventricle: The cavity size was normal. Wall thickness was   increased in a pattern of mild LVH. Indeterminant diastolic   function (atrial fibrillation). Systolic function was normal. The   estimated ejection fraction was in the range of 60% to 65%.   Operative Report by Dr. Tresa Moore on 10/10/16 Procedure: 1) Cystoscopy, left retrograde pyelogram interpretation. 2) Left diagnostic uteroscopy. 3) Insertion of left uteral stent; 5 x 24 Polaris with tether. Findings: 1) Unremarkable bladder. 2) Unremarkable left retrograde pyelogram. 3) No evidence of intraluminal urothelial neoplasm whatsoever with insepction of the left kidney and ureter times several. 4) Successful placement of left ureteral stent, proximal in renal pelvis and distal in urinary bladder.  ASSESSMENT & PLAN:  Mary [name] is a 83 y.o. female   1. Adenocarcinoma of left colon, sigmoid colon, TxNxM1 with liver and lung mets, stage IV, KRAS/NRS wild type, MSI-stable  -I previously discussed the imaging, colonoscopy and biopsy results with the patient and her family in detail.  -She has seen Drs. Manny  and Gross, surgical resections  were discussed.  -PET scan on 11/06/16 showed hypermetabolic pulmonary and hepatic lesions consistent with metastatic colon cancer with the primary located in the sigmoid colon. I reviewed the images with pt and her family members in person  -US biopsy on 11/11/16 of the left lobe of the liver revealed metastatic adenocarcinoma. The features are consistent with a primary gastrointestinal adenocarcinoma. I discussed with pt  -We previously discussed that her metastatic cancer is incurable at this stage due to the multiorgan metastasis, and her advanced age. The goal of therapy is palliative. -We discussed the indication for surgery, including significant bleeding, obstruction, or perforation. She does have mild constipation and intermittent abdominal cramps, but no significant bowel obstruction. Patient understands the surgery is palliative. She prefers to avoid surgery at this point.  -We reviewed [facility] test results, which showed MSI- stable, no KRAS/NRAS or BRAF mutations. Based on this, she is not a candidate for immunotherapy, but she will likely benefit from EGFR inhibitor, giving the left side colon cancer. --The [facility] results showed she is not a candidate for immunotherapy, but she is a candidate for EGFR antibody therapy. -We discussed that the prognosis of left side colon cancer without KRAS/NRAS and BRAF mutations is much better than right side colon cancer, especially with EGFR antibody treatment. -We discussed the options of systemic therapy. Due to her advanced age, she is not a candidate for intensive chemotherapy. I recommend that panitumumab, an EGFR inhibitor with low intensity chemo (5-FU infusion or Xeloda to control her cancer. -We discussed alternative management options with palliative care alone, which will be also appropriate given her advanced age and medical comorbidities. -Patient initially agreed with low intensity chemotherapy, but has changed her mind, she is  concerned more about the quality of life then the quality of life. We again had a long conversation about the benefit and potential side effects from chemotherapy. She decided to wait and see at this point. I think is reasonable given her advanced age and relatively not very symptomatic from her cancer.  -She wants to leave her follow-up appointment open for now, and she will call us if she needs to be seen.  2. Left renal mass and left hydronephrosis -Possible renal cell carcinoma, metastatic lesion from colon cancer is also a possibility. -She has been seen by [facility], Dr. Tresa Moore, and had left ureter stent placement on 10/10/2016.  3. Complex cyst of left ovary -will monitor. Likely benign -If she undergo abdominal surgery, may remove it -She declines surgery for now  4. Atrial Fibrillation -The patient has a history of atrial fibrillation. -Her cardiologist wants her to undergo cardio inversion and anticoagulation. -I advised the patient that even if she undergoes cardio inversion, her Afib might return. Also, her anticoagulation medication might make her bleeding worse. -The patient should continue taking baby aspirin.  5. HTN -Continue medications and follow up with primary care physician  6. Nutrition -We discussed palliative chemotherapy will make her fatigued, nauseous, and loss of appetite. -I stressed the importance of a healthy diet with plenty of protein. -I will make a referral to Nutrition.  7. Left flank and abdominal pain -The patient is taking Aleve for pain. -I prescribed Tylenol #3 on 11/13/16. -The patient discontinued Tylenol #3 due to chest discomfort and headaches. -she may try tramadol, she is able to tolerate tramadol   8. Anemia -The patient's Hb is 11.8 on 4.4 and has decreased from 12.3 on 10/31/16 and 13.9 on  10/10/16. This is blood loss from her tumor. -I advised the patient to take an baby dose iron pills and eat iron rich foods. -She is to drink plenty of  fluids to prevent constipation.  9. Goal of care discussion  -We again discussed the incurable nature of her cancer, and the overall poor prognosis, especially if she does not have good response to chemotherapy or progress on chemo -The patient understands the goal of care is palliative. -I recommend DNR/DNI, she will think about it   10. LE swelling -She is worsening with swelling in lower left leg and both feet  -We will order bilateral leg Doppler to rule out DVT.    Plan -f/u open, she will call us if she needs to be seen -Order venous doppler of LE to rule out DVT     All questions were answered. The patient knows to call the clinic with any problems, questions or concerns.  Post patient and her family members had many questions, I answered to their satisfaction. I spent 35 minutes counseling the patient face to face. The total time spent in the appointment was 40 minutes and more than 50% was on counseling.   Truitt Merle, MD 12/25/2016   This document serves as a record of services personally performed by Truitt Merle, MD. It was created on her behalf by Joslyn Devon, a trained medical scribe. The creation of this record is based on the scribe's personal observations and the provider's statements to them. This document has been checked and approved by the attending provider.   Addendum Her lower extremity Doppler was negative for DVT.  Truitt Merle  12/25/2016

## 2016-12-25 NOTE — Progress Notes (Signed)
83 year old female diagnosed with colon cancer and a left renal mass.  She is a patient of Dr. Burr Medico.  Past medical history includes postop nausea vomiting, atrial fibrillation, legal blindness, hypertension, hyperlipidemia, hard of hearing, hiatal hernia, and GERD.  Medications include Lipitor, vitamin D, multivitamin, Prilosec.  Labs were reviewed.  Height: 64 inches. Weight: 181.3 pounds on May 16. Usual body weight: 180 pounds. BMI: 31.12.  Patient reports fatigue and constipation. She reports a fair appetite. She denies recent weight changes. She tries to eat foods high iron and high in protein. She is taking a stool softener.  Nutrition diagnosis: Food and nutrition related knowledge deficit related to colon cancer as evidenced by no prior need for nutrition related information.  Intervention: Patient educated to consume small frequent meals and snacks that are high in calories and protein for weight maintenance. Educated patient on strategies for improving constipation. Provided information on high iron foods. Provided fact sheets.  Questions were answered.  Teach back method used.  Contact information given.  Monitoring, evaluation, goals: Patient will tolerate adequate calories and protein for weight maintenance.  Next visit: Patient will call for follow-up as needed.  **Disclaimer: This note was dictated with voice recognition software. Similar sounding words can inadvertently be transcribed and this note may contain transcription errors which may not have been corrected upon publication of note.**

## 2016-12-25 NOTE — Telephone Encounter (Signed)
Received report for doppler-negative for DVT.  Reported to Dr Burr Medico.

## 2017-01-01 ENCOUNTER — Telehealth: Payer: Self-pay | Admitting: Hematology

## 2017-01-01 NOTE — Telephone Encounter (Signed)
No los per 12/25/16 visit.

## 2017-01-02 ENCOUNTER — Telehealth: Payer: Self-pay | Admitting: Cardiovascular Disease

## 2017-01-02 DIAGNOSIS — I10 Essential (primary) hypertension: Secondary | ICD-10-CM | POA: Diagnosis not present

## 2017-01-02 DIAGNOSIS — R001 Bradycardia, unspecified: Secondary | ICD-10-CM | POA: Diagnosis not present

## 2017-01-02 DIAGNOSIS — R1310 Dysphagia, unspecified: Secondary | ICD-10-CM | POA: Diagnosis not present

## 2017-01-02 DIAGNOSIS — K2270 Barrett's esophagus without dysplasia: Secondary | ICD-10-CM | POA: Diagnosis not present

## 2017-01-02 DIAGNOSIS — C189 Malignant neoplasm of colon, unspecified: Secondary | ICD-10-CM | POA: Diagnosis not present

## 2017-01-02 DIAGNOSIS — K222 Esophageal obstruction: Secondary | ICD-10-CM | POA: Diagnosis not present

## 2017-01-02 DIAGNOSIS — E7800 Pure hypercholesterolemia, unspecified: Secondary | ICD-10-CM | POA: Diagnosis not present

## 2017-01-02 DIAGNOSIS — M810 Age-related osteoporosis without current pathological fracture: Secondary | ICD-10-CM | POA: Diagnosis not present

## 2017-01-02 DIAGNOSIS — Z0000 Encounter for general adult medical examination without abnormal findings: Secondary | ICD-10-CM | POA: Diagnosis not present

## 2017-01-02 DIAGNOSIS — I482 Chronic atrial fibrillation: Secondary | ICD-10-CM | POA: Diagnosis not present

## 2017-01-02 DIAGNOSIS — H3530 Unspecified macular degeneration: Secondary | ICD-10-CM | POA: Diagnosis not present

## 2017-01-02 DIAGNOSIS — E559 Vitamin D deficiency, unspecified: Secondary | ICD-10-CM | POA: Diagnosis not present

## 2017-01-02 NOTE — Telephone Encounter (Signed)
New message    They would like to get the procedure Dr Oval Linsey told them about scheduled for asap

## 2017-01-02 NOTE — Telephone Encounter (Signed)
[name] (daughter-in-law) Notified of Dr Blenda Mounts message.   She also wants to let DR Oval Linsey know that pt is having BLE/foot swelling her weight is 181# she is up a couple pounds and she is wearing her compression stockings. She further states that her SOB is worse and she her swelling is a little worse, she states that swelling does go down at HS upon lying flat but denies SOB when lying down. Can we start lasix or something? Please advise

## 2017-01-02 NOTE — Telephone Encounter (Signed)
Start lasix 40 mg daily.  Stop HCTZ and increase benazepril to 40 mg.  She needs to see an APP within a week.

## 2017-01-02 NOTE — Telephone Encounter (Signed)
S/w [name] (daughter-in-law) she states pt/family is wanting to do procedure (looks like from last OV note this is the DCCV). She states that pt/family is fine with the anticoagulation and DCCV. Colletta Maryland states that it was mentioned to Dr Burr Medico but states that she does not know if she said it was ok. She also wants to let DR Oval Linsey know that pt is having BLE/foot swelling her weight is 181# and she is wearing her compression stockings. She further states that her SOB is worse and she her swelling is a little worse, she states that swelling does go down at HS upon lying flat but denies SOB when lying down.   Per TR last OV note:We had a long discussion about the possibility of DCCV.  She is not interested at this time but will consider it.  This would require at least three weeks of anticoagulation prior (or TEE/DCCV) and four weeks after.  She would like think about it as well as talk with her oncologist, Dr. Burr Medico, to see if anticoagulation would be an option for her.  Continue aspirin for now.

## 2017-01-02 NOTE — Telephone Encounter (Signed)
Per Dr. Ernestina Penna note, she does not think that anticoagulation is a good idea, as it is likely to make her bleeding worse.  Therefore we cannot pursue cardioversion.  We will have to stick with rate control.

## 2017-01-03 ENCOUNTER — Telehealth: Payer: Self-pay | Admitting: *Deleted

## 2017-01-03 MED ORDER — FUROSEMIDE 40 MG PO TABS: 40.0000 mg | ORAL_TABLET | Freq: Every day | ORAL | 5 refills | Status: DC

## 2017-01-03 MED ORDER — BENAZEPRIL HCL 40 MG PO TABS: 40.0000 mg | ORAL_TABLET | Freq: Every day | ORAL | 5 refills | Status: DC

## 2017-01-03 NOTE — Telephone Encounter (Signed)
Dr. Oval Linsey,  Please see the phone message below. Pt has decided not to take chemo, on supportive care only. If you think cardioversion may improve her quality of life, please proceed, she is interested.  Truitt Merle MD

## 2017-01-03 NOTE — Telephone Encounter (Signed)
Advised Mary [name] Scheduled ov next week

## 2017-01-03 NOTE — Telephone Encounter (Signed)
I called back and told her that she can discussed with her cardiologist about her cardioversion and proceed if it's recommended,  no objection from me.   Truitt Merle MD

## 2017-01-03 NOTE — Telephone Encounter (Signed)
Daughter in law [name]) called wanting to get MD Burr Medico opinion about patient getting a cardioversion done. Previously it was not agreed upon due to patient starting chemotherapy. But now since she has declined chemotherapy, they would like to know if she can get this procedure done.

## 2017-01-03 NOTE — Telephone Encounter (Signed)
Call back # is [phone] or [phone]

## 2017-01-08 ENCOUNTER — Ambulatory Visit (INDEPENDENT_AMBULATORY_CARE_PROVIDER_SITE_OTHER): Payer: Medicare Other | Admitting: Cardiovascular Disease

## 2017-01-08 ENCOUNTER — Encounter: Payer: Self-pay | Admitting: Cardiovascular Disease

## 2017-01-08 VITALS — BP 126/77 | HR 78 | Ht 66.0 in | Wt 179.0 lb

## 2017-01-08 DIAGNOSIS — I481 Persistent atrial fibrillation: Secondary | ICD-10-CM

## 2017-01-08 DIAGNOSIS — I7121 Aneurysm of the ascending aorta, without rupture: Secondary | ICD-10-CM

## 2017-01-08 DIAGNOSIS — I4819 Other persistent atrial fibrillation: Secondary | ICD-10-CM

## 2017-01-08 DIAGNOSIS — I10 Essential (primary) hypertension: Secondary | ICD-10-CM | POA: Diagnosis not present

## 2017-01-08 DIAGNOSIS — E7800 Pure hypercholesterolemia, unspecified: Secondary | ICD-10-CM | POA: Diagnosis not present

## 2017-01-08 DIAGNOSIS — I712 Thoracic aortic aneurysm, without rupture: Secondary | ICD-10-CM | POA: Diagnosis not present

## 2017-01-08 MED ORDER — APIXABAN 5 MG PO TABS: 5.0000 mg | ORAL_TABLET | Freq: Two times a day (BID) | ORAL | 1 refills | Status: DC

## 2017-01-08 MED ORDER — LOSARTAN POTASSIUM 100 MG PO TABS: 100.0000 mg | ORAL_TABLET | Freq: Every day | ORAL | 5 refills | Status: DC

## 2017-01-08 NOTE — Patient Instructions (Addendum)
Medication Instructions:  STOP ASPIRIN   START ELIQUIS 5 MG TWICE A DAY   STOP BENAZEPRIL  START LOSARTAN 100 MG DAILY   Labwork: CBC/BMET/PT/INR TODAY    Testing/Procedures:  Your physician has requested that you have a TEE/Cardioversion. During a TEE, sound waves are used to create images of your heart. It provides your doctor with information about the size and shape of your heart and how well your heart's chambers and valves are working. In this test, a transducer is attached to the end of a flexible tube that is guided down you throat and into your esophagus (the tube leading from your mouth to your stomach) to get a more detailed image of your heart. Once the TEE has determined that a blood clot is not present, the cardioversion begins. Electrical Cardioversion uses a jolt of electricity to your heart either through paddles or wired patches attached to your chest. This is a controlled, usually prescheduled, procedure. This procedure is done at the hospital and you are not awake during the procedure. You usually go home the day of the procedure. Please see the instruction sheet given to you today for more information.  Follow-Up: Your physician recommends that you schedule a follow-up appointment in: WITHIN 1 MONTH OF TEE/CARDIOVERSION   Any Other Special Instructions Will Be Listed Below (If Applicable).  Your provider has recommended a cardioversion.   You are scheduled for a TEE/cardioversion on 01/20/17 AT 9:00 AM with Dr. Meda Coffee or associates. Please go to [facility] [place] Stay by 7:30 am.  Enter through the [place] not have any food or drink after midnight night before .  You may take your medicines with a sip of water on the day of your procedure.  You will need someone to drive you home following your procedure.   Call the [place] office at [phone] if you have any questions, problems or concerns.

## 2017-01-08 NOTE — Progress Notes (Signed)
Cardiology Office Note   Date:  01/09/2017   ID:  Mary [name], DOB 02/07/1933, MRN [id number]  PCP:  Moshe Cipro, MD  Cardiologist:   Skeet Latch, MD  Surgeon:  Michael Boston, MD Medical Oncologist: Truitt Merle, MD Gastroenterologist: Toma Deiters, MD Urology: Alexis Frock, MD  Chief Complaint  Patient presents with  . Follow-up  . Dizziness    occasionally.  . Leg Pain    occasionally.  . Edema    in legs and ankles.    History of Present Illness: Mary [name] is a 83 y.o. female with paroxysmal atrial fibrillation, hypertension, hyperlipidemia, recently diagnosed metastatic colon cancer, and macular degeneration, who presents for follow up.  She was first seen 10/2016 for management of atrial fibrillation and pre-surgical risk assessment.  Mary [name] was diagnosed with adenocarcinoma of the sigmoid colon 08/2016.  She had significant obstructive symptoms but changed her diet and is no longer constipated. She underwent cystoscopy and ureteroscopy with insertion of a L ureteral stent on 10/10/16 to better evaluate her kidney lesions.  At that time she was noted to be in atrial fibrillation.  She had an echo 11/04/16 that revealed LVEF 60-65% with mild LVH and a mildly dilated ascending aorta.  She was first seen 12/2016 and reported fatigue that was attributed to her atrial fibrillation.  At that time it was unclear if she would pursue surgery or chemotherapy.  She has since elected for palliative care and is interested in cardioversion.  At her last appointment HCTZ was discontinued and she was started on lasix.  She notes that this has helped her edema.  She continues to feel tired.  She likes to garden and has to stop frequently.  She denies palpitations, lightheadedness, chest pain, orthopnea or PND.  She also denies melena or hematochezia.  She does report a dry cough that has been ongoing for months.  She has been checking her BP at home and it has been running  111-133/64-76, HR 60s-80.  Past Medical History:  Diagnosis Date  . Aneurysm of ascending aorta (HCC) 12/03/2016   3.7 cm by echo 10/2016  . Colon cancer [facility])    dx via biospy from colonoscopy-- malignant ---  currently having work-up done w/ dr gross (general surgeon) and coordinate surgery w/ urologsit for renal tumor  . Dysrhythmia    A  Fib  . Essential hypertension 10/31/2016  . GERD (gastroesophageal reflux disease)   . Glaucoma   . Hiatal hernia   . HOH (hard of hearing)    bilateral even w/ hearing aids  . Hyperlipidemia   . Hypertension   . Kidney tumor    left side  . Legally blind in right eye, as defined in [place]    due to macular degeneration  . Macular degeneration   . Nausea    intermittant due to colon mass  . Ovarian cyst   . Persistent atrial fibrillation ([place]) 10/31/2016  . PONV (postoperative nausea and vomiting)   . Wears glasses   . Wears hearing aid    bilateral    Past Surgical History:  Procedure Laterality Date  . BREAST CYST EXCISION  1990   benign  . COLONOSCOPY  09/04/2016  . CYSTOSCOPY WITH RETROGRADE PYELOGRAM, URETEROSCOPY AND STENT PLACEMENT Left 10/10/2016   Procedure: CYSTOSCOPY WITH RETROGRADE PYELOGRAM, DIAGNOSTIC URETEROSCOPY  AND STENT PLACEMENT;  Surgeon: Alexis Frock, MD;  Location: [facility];  Service: Urology;  Laterality: Left;  . ESOPHAGOGASTRODUODENOSCOPY  11/07/2014  . NASAL SINUS SURGERY    . TONSILLECTOMY       Current Outpatient Prescriptions  Medication Sig Dispense Refill  . acetaminophen-codeine (TYLENOL #3) 300-30 MG tablet Take 1 tablet by mouth every 4 (four) hours as needed for moderate pain. 20 tablet 0  . alendronate (FOSAMAX) 70 MG tablet Take 70 mg by mouth once a week. Take with a full glass of water on an empty stomach.    Marland Kitchen amLODipine (NORVASC) 5 MG tablet Take 5 mg by mouth daily.    Marland Kitchen atorvastatin (LIPITOR) 10 MG tablet Take 10 mg by mouth daily.    . Cholecalciferol (VITAMIN D3) 5000  units CAPS Take 1 capsule by mouth daily.    Marland Kitchen diltiazem (CARDIZEM CD) 240 MG 24 hr capsule Take 1 capsule (240 mg total) by mouth daily. 30 capsule 5  . escitalopram (LEXAPRO) 10 MG tablet Take 10 mg by mouth daily.    . fexofenadine (ALLEGRA) 180 MG tablet Take 180 mg by mouth daily.    . furosemide (LASIX) 40 MG tablet Take 1 tablet (40 mg total) by mouth daily. 30 tablet 5  . latanoprost (XALATAN) 0.005 % ophthalmic solution Place 1 drop into both eyes at bedtime.    . Multiple Vitamins-Minerals (VITEYES AREDS ADVANCED) CAPS Take 1 capsule by mouth daily.    Marland Kitchen omeprazole (PRILOSEC) 40 MG capsule Take 40 mg by mouth daily.    . ondansetron (ZOFRAN) 8 MG tablet Take 1 tablet (8 mg total) by mouth every 8 (eight) hours as needed for nausea. 30 tablet 2  . Polyethyl Glycol-Propyl Glycol (SYSTANE) 0.4-0.3 % SOLN Apply to eye as needed.    . traMADol (ULTRAM) 50 MG tablet Take 1 tablet (50 mg total) by mouth every 6 (six) hours as needed. 60 tablet 1  . apixaban (ELIQUIS) 5 MG TABS tablet Take 1 tablet (5 mg total) by mouth 2 (two) times daily. 60 tablet 1  . losartan (COZAAR) 100 MG tablet Take 1 tablet (100 mg total) by mouth daily. 30 tablet 5   No current facility-administered medications for this visit.     Allergies:   Tramadol; Benazepril; and Bactrim [sulfamethoxazole-trimethoprim]    Social History:  The patient  reports that she has never smoked. She has never used smokeless tobacco. She reports that she does not drink alcohol or use drugs.   Family History:  The patient's family history includes Cancer (age of onset: 41) in her brother; Cancer (age of onset: 73) in her sister; Diabetes in her son; High blood pressure in her mother and son; Peripheral Artery Disease in her mother.    ROS:  Please see the history of present illness.   Otherwise, review of systems are positive for none.   All other systems are reviewed and negative.    PHYSICAL EXAM: VS:  BP 126/77   Pulse 78    Ht 5\' 6"  (1.676 m)   Wt 81.2 kg (179 lb)   BMI 28.89 kg/m  , BMI Body mass index is 28.89 kg/m. GENERAL:  Well appearing.  No acute distress. HEENT:  Pupils equal round and reactive, fundi not visualized, oral mucosa unremarkable NECK:  No jugular venous distention, waveform within normal limits, carotid upstroke brisk and symmetric, no bruits LUNGS:  Clear to auscultation bilaterally.  No crackles, wheezes or rhonchi.  HEART:  Irregularly irregular.  PMI not displaced or sustained,S1 and S2 within normal limits, no S3, no S4, no clicks, no rubs, no murmurs ABD:  Flat, positive bowel sounds normal in frequency in pitch, no bruits, no rebound, no guarding, no midline pulsatile mass, no hepatomegaly, no splenomegaly EXT:  2 plus pulses throughout, trace edema, no cyanosis no clubbing SKIN:  No rashes no nodules NEURO:  Cranial nerves II through XII grossly intact, motor grossly intact throughout PSYCH:  Cognitively intact, oriented to person place and time   EKG:  EKG is ordered today. The ekg ordered 10/31/16 demonstrates atrial fibrillation.  Rate 105 bpm. LAFB. Poor R wave progression 01/08/17: Atrial fibrillation.  Rate 78 bpm.  LAD.  Echo 11/04/16: Study Conclusions  - Left ventricle: The cavity size was normal. Wall thickness was   increased in a pattern of mild LVH. Indeterminant diastolic   function (atrial fibrillation). Systolic function was normal. The   estimated ejection fraction was in the range of 60% to 65%. Wall   motion was normal; there were no regional wall motion   abnormalities. - Aortic valve: There was no stenosis. - Aorta: Mildly dilated aortic root. Aortic root dimension: 37 mm   (ED). - Mitral valve: Mildly calcified annulus. There was trivial   regurgitation. - Left atrium: The atrium was mildly dilated. - Right ventricle: The cavity size was normal. Systolic function   was normal. - Right atrium: The atrium was mildly dilated. - Tricuspid valve: Peak  RV-RA gradient (S): 34 mm Hg. - Pulmonary arteries: PA peak pressure: 37 mm Hg (S). - Inferior vena cava: The vessel was normal in size. The   respirophasic diameter changes were in the normal range (>= 50%),   consistent with normal central venous pressure.   Recent Labs: 10/31/2016: TSH 1.82 12/25/2016: ALT 13; HGB 12.1; Magnesium 2.5 01/08/2017: BUN 20; Creatinine, Ser 1.09; Platelets 367; Potassium 4.0; Sodium 137    Lipid Panel No results found for: CHOL, TRIG, HDL, CHOLHDL, VLDL, LDLCALC, LDLDIRECT    Wt Readings from Last 3 Encounters:  01/08/17 81.2 kg (179 lb)  12/25/16 82.2 kg (181 lb 4.8 oz)  12/04/16 82.6 kg (182 lb)      ASSESSMENT AND PLAN:  # Persistent atrial fibrillation: Mary [name] remains in atrial fibrillation and her rates are well controlled.  It is unclear how much of her fatigue is due to atrial fibrillation vs. Cancer.  She wants to pursue cardioversion to try and improve her quality of life.  We discussed the risk of anticoagulation given her known malignancy.  She has decided not to pursue surgery or chemotherapy.  Her h/h has been stable and she has not experienced any bleeding on aspirin.  We will stop aspirin and start Eliquis 5mg  bid.  She will undergo TEE/DCCV.  Continue diltiazem.  # Cough: Likely due to benazepril.  We will switch this to losartan.  If it doesn't improve consider that it is related to her malignancy.  # Hypertension:  Blood pressure controlled.  Continue amlodipine, diltiazem, and HCTZ.  Switch benazepril to losartan as above.  # Hyperlipidemia: Continue atorvastatin.  Consider stopping given her transition towards palliation.  # Mild ascending aortic aneurysm: No further monitoring indicated given comorbid disease.  Current medicines are reviewed at length with the patient today.  The patient does not have concerns regarding medicines.  The following changes have been made:  Stop aspirin and start Eliquis.  Switch benazepril to  losartan.  Labs/ tests ordered today include:   Orders Placed This Encounter  Procedures  . CBC with Differential/Platelet  . Basic metabolic panel  . INR/PT  . EKG  12-Lead    Time spent: 45 minutes-Greater than 50% of this time was spent in counseling, explanation of diagnosis, planning of further management, and coordination of care.   Disposition:   FU with Ebin Palazzi C. Oval Linsey, MD, [facility] in 1 month.   This note was written with the assistance of speech recognition software.  Please excuse any transcriptional errors.  Signed, Damaya Channing C. Oval Linsey, MD, [facility]  01/09/2017 5:12 PM    [place]

## 2017-01-09 LAB — PROTIME-INR
INR: 1 (ref 0.8–1.2)
Prothrombin Time: 11.1 s (ref 9.1–12.0)

## 2017-01-09 LAB — CBC WITH DIFFERENTIAL/PLATELET
BASOS: 0 %
Basophils Absolute: 0 10*3/uL (ref 0.0–0.2)
EOS (ABSOLUTE): 0.4 10*3/uL (ref 0.0–0.4)
EOS: 3 %
HEMATOCRIT: 37.2 % (ref 34.0–46.6)
HEMOGLOBIN: 12.1 g/dL (ref 11.1–15.9)
IMMATURE GRANULOCYTES: 0 %
Immature Grans (Abs): 0 10*3/uL (ref 0.0–0.1)
LYMPHS ABS: 1.8 10*3/uL (ref 0.7–3.1)
Lymphs: 15 %
MCH: 29.4 pg (ref 26.6–33.0)
MCHC: 32.5 g/dL (ref 31.5–35.7)
MCV: 90 fL (ref 79–97)
MONOCYTES: 11 %
MONOS ABS: 1.3 10*3/uL — AB (ref 0.1–0.9)
Neutrophils Absolute: 8.1 10*3/uL — ABNORMAL HIGH (ref 1.4–7.0)
Neutrophils: 71 %
Platelets: 367 10*3/uL (ref 150–379)
RBC: 4.12 x10E6/uL (ref 3.77–5.28)
RDW: 14.3 % (ref 12.3–15.4)
WBC: 11.6 10*3/uL — AB (ref 3.4–10.8)

## 2017-01-09 LAB — BASIC METABOLIC PANEL
BUN / CREAT RATIO: 18 (ref 12–28)
BUN: 20 mg/dL (ref 8–27)
CALCIUM: 9.7 mg/dL (ref 8.7–10.3)
CO2: 25 mmol/L (ref 18–29)
Chloride: 97 mmol/L (ref 96–106)
Creatinine, Ser: 1.09 mg/dL — ABNORMAL HIGH (ref 0.57–1.00)
GFR calc non Af Amer: 48 mL/min/{1.73_m2} — ABNORMAL LOW (ref >59)
GFR, EST AFRICAN AMERICAN: 55 mL/min/{1.73_m2} — AB (ref >59)
Glucose: 88 mg/dL (ref 65–99)
Potassium: 4 mmol/L (ref 3.5–5.2)
SODIUM: 137 mmol/L (ref 134–144)

## 2017-01-09 MED ORDER — SODIUM CHLORIDE 0.9 % IV SOLN: 250.0000 mL | INTRAVENOUS | Status: DC

## 2017-01-09 MED ORDER — SODIUM CHLORIDE 0.9% FLUSH: 3.0000 mL | INTRAVENOUS | Status: DC | PRN

## 2017-01-09 MED ORDER — SODIUM CHLORIDE 0.9% FLUSH: 3.0000 mL | Freq: Two times a day (BID) | INTRAVENOUS | Status: DC

## 2017-01-10 ENCOUNTER — Telehealth: Payer: Self-pay | Admitting: Cardiovascular Disease

## 2017-01-10 NOTE — Telephone Encounter (Signed)
Mrs. Mary [name]( Daughter in [place]) Was here on Wednesday and was placed on Eliquis and was told to call back if she started bleeding . She has started bleeding after 2 doses and wants to know does she go back on the aspirin until her procedure on the 11th . Please call

## 2017-01-10 NOTE — Telephone Encounter (Signed)
Recommendations given to daughter and Dr Oval Linsey will be in touch to discuss further

## 2017-01-10 NOTE — Telephone Encounter (Signed)
Spoke with Mary [name], she reports the blood was bright red, was on the toilet paper when she wiped and in the stool in the toliet. It was a small amount per the patient report. She is scheduled for TEE DCCV 01-20-17. Will make dr Oval Linsey aware.

## 2017-01-10 NOTE — Telephone Encounter (Signed)
Discussed with Dr Lajuana Ripple and will have patient stop Eliquis and restart ASA 81 mg daily

## 2017-01-10 NOTE — Telephone Encounter (Signed)
Spoke with Ms. Salmon's daughter in law who handles her medical affairs.  Given her rectal bleeding on Eliquis we We'll stop this medication. I've asked her to continue to hold aspirin. She can restart aspirin 81 mg once the bleeding stops. Given that she has had bleeding after just 2 doses of Eliquis, she does not point to be a candidate for anticoagulation. On talking with her daughter and appears that she is actually been in atrial fibrillation for years. Therefore, it makes it less likely that her more recent fatigue is due to the atrial fibrillation. It is much more likely due to her metastatic cancer. We will not pursue TEE/cardioversion she expressed understanding and will explain this to Ms. Humphries.    Khaniya Tenaglia C. Oval Linsey, MD, [facility] 01/10/2017 5:29 PM

## 2017-01-10 NOTE — Telephone Encounter (Signed)
Spoke with pt dtr-n-law, I am unable to talk with the patient because she is so hard of hearing. Colletta Maryland will call the patient and ask specific questions and call me back.

## 2017-01-13 NOTE — Anesthesia Postprocedure Evaluation (Signed)
Anesthesia Post Note  Patient: Mary [name]  Procedure(s) Performed: Procedure(s) (LRB): CYSTOSCOPY WITH RETROGRADE PYELOGRAM, DIAGNOSTIC URETEROSCOPY  AND STENT PLACEMENT (Left)     Anesthesia Post Evaluation  Last Vitals:  Vitals:   10/10/16 1320 10/10/16 1414  BP:  138/78  Pulse: 99 92  Resp: 15 14  Temp:  37 C    Last Pain:  Vitals:   10/10/16 1425  TempSrc:   PainSc: 3                  Kellie Chisolm S

## 2017-01-13 NOTE — Telephone Encounter (Signed)
Called and cancelled TEE/cardioversion

## 2017-01-13 NOTE — Addendum Note (Signed)
Addendum  created 01/13/17 1150 by Myrtie Soman, MD   Sign clinical note

## 2017-01-20 ENCOUNTER — Ambulatory Visit (HOSPITAL_COMMUNITY): Admission: RE | Admit: 2017-01-20 | Payer: Medicare Other | Source: Ambulatory Visit | Admitting: Cardiology

## 2017-01-20 ENCOUNTER — Encounter (HOSPITAL_COMMUNITY): Admission: RE | Payer: Self-pay | Source: Ambulatory Visit

## 2017-01-20 SURGERY — ECHOCARDIOGRAM, TRANSESOPHAGEAL
Anesthesia: Moderate Sedation

## 2017-02-26 ENCOUNTER — Encounter: Payer: Self-pay | Admitting: Cardiovascular Disease

## 2017-02-26 ENCOUNTER — Ambulatory Visit (INDEPENDENT_AMBULATORY_CARE_PROVIDER_SITE_OTHER): Payer: Medicare Other | Admitting: Cardiovascular Disease

## 2017-02-26 VITALS — BP 126/64 | HR 70 | Ht 66.0 in | Wt 184.0 lb

## 2017-02-26 DIAGNOSIS — I712 Thoracic aortic aneurysm, without rupture: Secondary | ICD-10-CM

## 2017-02-26 DIAGNOSIS — E7800 Pure hypercholesterolemia, unspecified: Secondary | ICD-10-CM | POA: Diagnosis not present

## 2017-02-26 DIAGNOSIS — I10 Essential (primary) hypertension: Secondary | ICD-10-CM | POA: Diagnosis not present

## 2017-02-26 DIAGNOSIS — K921 Melena: Secondary | ICD-10-CM

## 2017-02-26 DIAGNOSIS — I481 Persistent atrial fibrillation: Secondary | ICD-10-CM

## 2017-02-26 DIAGNOSIS — I7121 Aneurysm of the ascending aorta, without rupture: Secondary | ICD-10-CM

## 2017-02-26 DIAGNOSIS — Z79899 Other long term (current) drug therapy: Secondary | ICD-10-CM

## 2017-02-26 DIAGNOSIS — I4819 Other persistent atrial fibrillation: Secondary | ICD-10-CM

## 2017-02-26 NOTE — Patient Instructions (Signed)
Medication Instructions:  STOP ATORVASTATIN   STOP VITAMIN D  Labwork: BMET/CBC  Testing/Procedures: NONE  Follow-Up: Your physician recommends that you schedule a follow-up appointment in: 4 MONTHS   If you need a refill on your cardiac medications before your next appointment, please call your pharmacy.

## 2017-02-26 NOTE — Progress Notes (Signed)
Cardiology Office Note   Date:  03/03/2017   ID:  Mary [name], DOB 02/07/1933, MRN [id number]  PCP:  Moshe Cipro, MD  Cardiologist:   Skeet Latch, MD  Surgeon:  Michael Boston, MD Medical Oncologist: Truitt Merle, MD Gastroenterologist: Toma Deiters, MD Urology: Alexis Frock, MD  Chief Complaint  Patient presents with  . Follow-up    chest pain, shortness of breath with exertion, occassioal weakness in legs,  has edenma legs, ankles and feet-wearing compression hoes, occassional lightheaded & dizzness    History of Present Illness: Mary [name] is an 83 y.o. female with persistent atrial fibrillation, hypertension, hyperlipidemia, metastatic colon cancer, and macular degeneration, who presents for follow up.  She was first seen 10/2016 for management of atrial fibrillation and pre-surgical risk assessment.  Mary [name] was diagnosed with adenocarcinoma of the sigmoid colon 08/2016.  She had significant obstructive symptoms but changed her diet with improvement in her symptoms. She underwent cystoscopy and ureteroscopy with insertion of a L ureteral stent on 10/10/16.  At that time she was noted to be in atrial fibrillation.  She had an echo 11/04/16 that revealed LVEF 60-65% with mild LVH and a mildly dilated ascending aorta.  She was first seen by cardiology 12/2016 and reported fatigue that was attributed to her atrial fibrillation.  At that time it was unclear if she would pursue surgery or chemotherapy.  She has since elected for palliative care and was interested in cardioversion.  After discussion with her oncologist she was started on Eliquis with plans for outpatient TEE/DCCV.  However, she quickly developed hematochezia and it was discontinued.  She continues to have a small amount of blood in her stool.   Since her last appointment Mary [name] has been feelng tired and weak.  She continues to have mild lower extremity edema, though better since switching HCTZ to lasix.  She  complains of nocturia but no orthopnea or PND.  She was switched from benazepril to losartan with improvement in her cough.  She does complain of dizziness when bending over.  She denies syncope or pre-syncope.  She denies chest pain but does have mild shortness of breath with exertion.   Past Medical History:  Diagnosis Date  . Aneurysm of ascending aorta (HCC) 12/03/2016   3.7 cm by echo 10/2016  . Colon cancer [facility])    dx via biospy from colonoscopy-- malignant ---  currently having work-up done w/ dr gross (general surgeon) and coordinate surgery w/ urologsit for renal tumor  . Dysrhythmia    A  Fib  . Essential hypertension 10/31/2016  . GERD (gastroesophageal reflux disease)   . Glaucoma   . Hiatal hernia   . HOH (hard of hearing)    bilateral even w/ hearing aids  . Hyperlipidemia   . Hypertension   . Kidney tumor    left side  . Legally blind in right eye, as defined in [place]    due to macular degeneration  . Macular degeneration   . Nausea    intermittant due to colon mass  . Ovarian cyst   . Persistent atrial fibrillation ([place]) 10/31/2016  . PONV (postoperative nausea and vomiting)   . Wears glasses   . Wears hearing aid    bilateral    Past Surgical History:  Procedure Laterality Date  . BREAST CYST EXCISION  1990   benign  . COLONOSCOPY  09/04/2016  . CYSTOSCOPY WITH RETROGRADE PYELOGRAM, URETEROSCOPY AND STENT PLACEMENT Left 10/10/2016  Procedure: CYSTOSCOPY WITH RETROGRADE PYELOGRAM, DIAGNOSTIC URETEROSCOPY  AND STENT PLACEMENT;  Surgeon: Alexis Frock, MD;  Location: [facility];  Service: Urology;  Laterality: Left;  . ESOPHAGOGASTRODUODENOSCOPY  11/07/2014  . NASAL SINUS SURGERY    . TONSILLECTOMY       Current Outpatient Prescriptions  Medication Sig Dispense Refill  . alendronate (FOSAMAX) 70 MG tablet Take 70 mg by mouth once a week. Take with a full glass of water on an empty stomach.    Marland Kitchen amLODipine (NORVASC) 5 MG tablet Take 5 mg by  mouth daily.    Marland Kitchen diltiazem (CARDIZEM CD) 240 MG 24 hr capsule Take 1 capsule (240 mg total) by mouth daily. 30 capsule 5  . escitalopram (LEXAPRO) 10 MG tablet Take 10 mg by mouth daily.    . fexofenadine (ALLEGRA) 180 MG tablet Take 180 mg by mouth daily.    . furosemide (LASIX) 40 MG tablet Take 1 tablet (40 mg total) by mouth daily. 30 tablet 5  . latanoprost (XALATAN) 0.005 % ophthalmic solution Place 1 drop into both eyes at bedtime.    Marland Kitchen losartan (COZAAR) 100 MG tablet Take 1 tablet (100 mg total) by mouth daily. 30 tablet 5  . Multiple Vitamins-Minerals (VITEYES AREDS ADVANCED) CAPS Take 1 capsule by mouth daily.    Marland Kitchen omeprazole (PRILOSEC) 40 MG capsule Take 40 mg by mouth daily.    . ondansetron (ZOFRAN) 8 MG tablet Take 1 tablet (8 mg total) by mouth every 8 (eight) hours as needed for nausea. 30 tablet 2  . Polyethyl Glycol-Propyl Glycol (SYSTANE) 0.4-0.3 % SOLN Apply to eye as needed.    . traMADol (ULTRAM) 50 MG tablet Take 1 tablet (50 mg total) by mouth every 6 (six) hours as needed. 60 tablet 1   Current Facility-Administered Medications  Medication Dose Route Frequency Provider Last Rate Last Dose  . 0.9 %  sodium chloride infusion  250 mL Intravenous Continuous Skeet Latch, MD      . sodium chloride flush (NS) 0.9 % injection 3 mL  3 mL Intravenous Q12H Skeet Latch, MD      . sodium chloride flush (NS) 0.9 % injection 3 mL  3 mL Intravenous PRN Skeet Latch, MD        Allergies:   Benazepril and Bactrim [sulfamethoxazole-trimethoprim]    Social History:  The patient  reports that she has never smoked. She has never used smokeless tobacco. She reports that she does not drink alcohol or use drugs.   Family History:  The patient's family history includes Cancer (age of onset: 26) in her brother; Cancer (age of onset: 27) in her sister; Diabetes in her son; High blood pressure in her mother and son; Peripheral Artery Disease in her mother.    ROS:  Please  see the history of present illness.   Otherwise, review of systems are positive for none.   All other systems are reviewed and negative.    PHYSICAL EXAM: VS:  BP 126/64   Pulse 70   Ht 5\' 6"  (1.676 m)   Wt 83.5 kg (184 lb)   BMI 29.70 kg/m  , BMI Body mass index is 29.7 kg/m. GENERAL:  Tired appearing.  No acute distress. HEENT:  Pupils equal round and reactive, fundi not visualized, oral mucosa unremarkable NECK:  No jugular venous distention, waveform within normal limits, carotid upstroke brisk and symmetric, no bruits, no thyromegaly LYMPHATICS:  No cervical adenopathy LUNGS:  Clear to auscultation bilaterally HEART:  Irregularly  irregular.  PMI not displaced or sustained,S1 and S2 within normal limits, no S3, no S4, no clicks, no rubs, no murmurs ABD:  Flat, positive bowel sounds normal in frequency in pitch, no bruits, no rebound, no guarding, no midline pulsatile mass, no hepatomegaly, no splenomegaly EXT:  2 plus pulses throughout, no edema, no cyanosis no clubbing SKIN:  No rashes no nodules NEURO:  Cranial nerves II through XII grossly intact, motor grossly intact throughout PSYCH:  Cognitively intact, oriented to person place and time  EKG:  EKG is ordered today. The ekg ordered 10/31/16 demonstrates atrial fibrillation.  Rate 105 bpm. LAFB. Poor R wave progression 01/08/17: Atrial fibrillation.  Rate 78 bpm.  LAD.  Echo 11/04/16: Study Conclusions  - Left ventricle: The cavity size was normal. Wall thickness was   increased in a pattern of mild LVH. Indeterminant diastolic   function (atrial fibrillation). Systolic function was normal. The   estimated ejection fraction was in the range of 60% to 65%. Wall   motion was normal; there were no regional wall motion   abnormalities. - Aortic valve: There was no stenosis. - Aorta: Mildly dilated aortic root. Aortic root dimension: 37 mm   (ED). - Mitral valve: Mildly calcified annulus. There was trivial   regurgitation. -  Left atrium: The atrium was mildly dilated. - Right ventricle: The cavity size was normal. Systolic function   was normal. - Right atrium: The atrium was mildly dilated. - Tricuspid valve: Peak RV-RA gradient (S): 34 mm Hg. - Pulmonary arteries: PA peak pressure: 37 mm Hg (S). - Inferior vena cava: The vessel was normal in size. The   respirophasic diameter changes were in the normal range (>= 50%),   consistent with normal central venous pressure.   Recent Labs: 10/31/2016: TSH 1.82 12/25/2016: ALT 13; Magnesium 2.5 02/26/2017: BUN 19; Creatinine, Ser 0.88; Hemoglobin 12.0; Platelets 360; Potassium 4.9; Sodium 140    Lipid Panel No results found for: CHOL, TRIG, HDL, CHOLHDL, VLDL, LDLCALC, LDLDIRECT    Wt Readings from Last 3 Encounters:  02/26/17 83.5 kg (184 lb)  01/08/17 81.2 kg (179 lb)  12/25/16 82.2 kg (181 lb 4.8 oz)      ASSESSMENT AND PLAN:  # Persistent atrial fibrillation: Mary [name] remains in atrial fibrillation.  Rates are well-controlled. It is unclear how much of her fatigue is due to atrial fibrillation or malignancy.  Unfortunately she cannot tolerate anticoagulation, so we cannot pursue TEE/DCCV.  She continues to have hematochezia so aspirin is on hold as well.  Continue diltiazem.  # Cough: Improved since stopping ACE-I.  # Hypertension:  Blood pressure controlled.  Continue amlodipine, diltiazem, losartan, and HCTZ.   # Hyperlipidemia: Atorvastatin was discontinued as she is choosing a palliative approach to care.   # Hematochezia: Check cbc  # LE Edema:  Mary [name] was started on lasix at the last appointment.  Check BMP.  # Mild ascending aortic aneurysm: No further monitoring indicated given comorbid disease.  # Palliative care:  We discussed her desire to avoid futile care and focus on comfort.  Stop atorvastatin and vitamin D.  Current medicines are reviewed at length with the patient today.  The patient does not have concerns regarding  medicines.  The following changes have been made:  Stop aspirin and start Eliquis.  Switch benazepril to losartan.  Labs/ tests ordered today include:   Orders Placed This Encounter  Procedures  . CBC with Differential/Platelet  . Basic metabolic panel  Disposition:   FU with [place] Every C. Oval Linsey, MD, [facility] in 4 month.  Time spent: 40 minutes-Greater than 50% of this time was spent in counseling, explanation of diagnosis, planning of further management, and coordination of care.  This note was written with the assistance of speech recognition software.  Please excuse any transcriptional errors.  Signed, Aurilla Coulibaly C. Oval Linsey, MD, [facility]  03/03/2017 3:31 PM    [facility]

## 2017-02-27 LAB — CBC WITH DIFFERENTIAL/PLATELET
Basophils Absolute: 0 10*3/uL (ref 0.0–0.2)
Basos: 0 %
EOS (ABSOLUTE): 0.4 10*3/uL (ref 0.0–0.4)
EOS: 3 %
HEMATOCRIT: 37.3 % (ref 34.0–46.6)
Hemoglobin: 12 g/dL (ref 11.1–15.9)
Immature Grans (Abs): 0 10*3/uL (ref 0.0–0.1)
Immature Granulocytes: 0 %
Lymphocytes Absolute: 2 10*3/uL (ref 0.7–3.1)
Lymphs: 16 %
MCH: 28.8 pg (ref 26.6–33.0)
MCHC: 32.2 g/dL (ref 31.5–35.7)
MCV: 89 fL (ref 79–97)
MONOS ABS: 1.5 10*3/uL — AB (ref 0.1–0.9)
Monocytes: 12 %
NEUTROS ABS: 8.4 10*3/uL — AB (ref 1.4–7.0)
Neutrophils: 69 %
PLATELETS: 360 10*3/uL (ref 150–379)
RBC: 4.17 x10E6/uL (ref 3.77–5.28)
RDW: 14.1 % (ref 12.3–15.4)
WBC: 12.5 10*3/uL — ABNORMAL HIGH (ref 3.4–10.8)

## 2017-02-27 LAB — BASIC METABOLIC PANEL
BUN / CREAT RATIO: 22 (ref 12–28)
BUN: 19 mg/dL (ref 8–27)
CO2: 25 mmol/L (ref 20–29)
Calcium: 10 mg/dL (ref 8.7–10.3)
Chloride: 99 mmol/L (ref 96–106)
Creatinine, Ser: 0.88 mg/dL (ref 0.57–1.00)
GFR, EST AFRICAN AMERICAN: 71 mL/min/{1.73_m2} (ref >59)
GFR, EST NON AFRICAN AMERICAN: 61 mL/min/{1.73_m2} (ref >59)
Glucose: 81 mg/dL (ref 65–99)
POTASSIUM: 4.9 mmol/L (ref 3.5–5.2)
Sodium: 140 mmol/L (ref 134–144)

## 2017-03-04 ENCOUNTER — Telehealth: Payer: Self-pay | Admitting: *Deleted

## 2017-03-04 ENCOUNTER — Other Ambulatory Visit: Payer: Self-pay | Admitting: *Deleted

## 2017-03-04 MED ORDER — DILTIAZEM HCL ER COATED BEADS 240 MG PO CP24: 240.0000 mg | ORAL_CAPSULE | Freq: Every day | ORAL | 1 refills | Status: DC

## 2017-03-04 MED ORDER — FUROSEMIDE 40 MG PO TABS: 40.0000 mg | ORAL_TABLET | Freq: Every day | ORAL | 1 refills | Status: DC

## 2017-03-04 MED ORDER — LOSARTAN POTASSIUM 100 MG PO TABS: 100.0000 mg | ORAL_TABLET | Freq: Every day | ORAL | 1 refills | Status: DC

## 2017-03-04 MED ORDER — AMLODIPINE BESYLATE 5 MG PO TABS: 5.0000 mg | ORAL_TABLET | Freq: Every day | ORAL | 1 refills | Status: AC

## 2017-03-04 NOTE — Telephone Encounter (Signed)
"  Mary [name] calling on behalf of [name].  She needs her tramadol refill sent to [facility].  Has received tram[facility] from [facility] but with permission from Dr. Burr Medico she can get this.  Needs a 90-day supply.  Needs does increased from two pills daily to three pills daily with increased pain.  Does not want to run out.  My return number is [phone]."    Updated preferred pharmacy list.

## 2017-03-04 NOTE — Telephone Encounter (Signed)
Please refill tramadol 50mg  q6h as needed for pain, 60-90 day supply per [facility] allows. Thanks   Truitt Merle MD

## 2017-03-07 DIAGNOSIS — H401114 Primary open-angle glaucoma, right eye, indeterminate stage: Secondary | ICD-10-CM | POA: Diagnosis not present

## 2017-03-07 DIAGNOSIS — H401121 Primary open-angle glaucoma, left eye, mild stage: Secondary | ICD-10-CM | POA: Diagnosis not present

## 2017-03-12 ENCOUNTER — Telehealth: Payer: Self-pay | Admitting: Cardiovascular Disease

## 2017-03-12 ENCOUNTER — Other Ambulatory Visit: Payer: Self-pay | Admitting: *Deleted

## 2017-03-12 ENCOUNTER — Telehealth: Payer: Self-pay | Admitting: *Deleted

## 2017-03-12 DIAGNOSIS — C186 Malignant neoplasm of descending colon: Secondary | ICD-10-CM

## 2017-03-12 MED ORDER — FUROSEMIDE 40 MG PO TABS: 40.0000 mg | ORAL_TABLET | Freq: Every day | ORAL | 1 refills | Status: DC

## 2017-03-12 MED ORDER — LOSARTAN POTASSIUM 100 MG PO TABS: 100.0000 mg | ORAL_TABLET | Freq: Every day | ORAL | 1 refills | Status: DC

## 2017-03-12 MED ORDER — TRAMADOL HCL 50 MG PO TABS: 50.0000 mg | ORAL_TABLET | Freq: Three times a day (TID) | ORAL | 0 refills | Status: DC | PRN

## 2017-03-12 NOTE — Telephone Encounter (Signed)
Spoke with DIL. Apologized that Rx(s) - lasix & losartan - were sent to wrong pharmacy ([facility]). Sent Rx(s) to [facility] per request.

## 2017-03-12 NOTE — Telephone Encounter (Signed)
New message    Pt daughter is calling for pt. She is asking for a call back from RN. She said it's about her medication. She said last time she left a message it was sent to the wrong place.

## 2017-03-12 NOTE — Telephone Encounter (Signed)
Daughter in-law [name] called inquiring about 90 day supply of Tramadol for pt.  Dr. Burr Medico notified.  Tramadol 50 mg tablet  #  270  With no refill called in to [facility] pharmacy .   Called Colletta Maryland and left message requesting a call back to nurse in the am 03/13/17. [name]'s    Phone    [phone].

## 2017-04-01 ENCOUNTER — Telehealth: Payer: Self-pay

## 2017-04-01 NOTE — Telephone Encounter (Signed)
Had a call from Estelle that the rx has birth year on 1934. Our records follow her DL that says 1934. The insurance company information has [phone]. The government decided her birth year was [birth year], original records in [place] were destroyed, and that is on her insurance. This has been known since February. The pharmacy is giving push back about the date discrepancy, but only on the tramadol rx. This rn confirmed the pharmacy is sending the tramadol.  Colletta Maryland stated that by the time she could even straighten this out the pt will have passed away.   Colletta Maryland is tired of this always being brought to her attention.   This RN did put a comment in patient care coordination note.  Forwarded to Dr Burr Medico - Juluis Rainier

## 2017-05-07 ENCOUNTER — Telehealth: Payer: Self-pay | Admitting: *Deleted

## 2017-05-07 NOTE — Telephone Encounter (Signed)
Received call from daughter in-law [name] updating pt's status.  Per Mary [name], pt has had increased abdominal pain;  Has increased nausea/vomiting - antiemetics not helping much;  Legs swelling more. Pt would like to meet with Dr. Burr Medico to : 1.    Discuss change in her health. 2.    How long does pt have. 3.    Is Hospice the next plan of care. 4.    Symptoms management. [name] appts for lab and to see Dr. Burr Medico on Thursday   05/08/17.   Mary [name] voiced understanding. [name]'s    Phone       [phone].

## 2017-05-08 ENCOUNTER — Telehealth: Payer: Self-pay | Admitting: Hematology

## 2017-05-08 ENCOUNTER — Encounter: Payer: Self-pay | Admitting: Hematology

## 2017-05-08 ENCOUNTER — Other Ambulatory Visit (HOSPITAL_BASED_OUTPATIENT_CLINIC_OR_DEPARTMENT_OTHER): Payer: Medicare Other

## 2017-05-08 ENCOUNTER — Ambulatory Visit (HOSPITAL_BASED_OUTPATIENT_CLINIC_OR_DEPARTMENT_OTHER): Payer: Medicare Other | Admitting: Hematology

## 2017-05-08 DIAGNOSIS — R109 Unspecified abdominal pain: Secondary | ICD-10-CM

## 2017-05-08 DIAGNOSIS — C186 Malignant neoplasm of descending colon: Secondary | ICD-10-CM

## 2017-05-08 DIAGNOSIS — C7800 Secondary malignant neoplasm of unspecified lung: Secondary | ICD-10-CM | POA: Diagnosis not present

## 2017-05-08 DIAGNOSIS — I10 Essential (primary) hypertension: Secondary | ICD-10-CM | POA: Diagnosis not present

## 2017-05-08 DIAGNOSIS — C187 Malignant neoplasm of sigmoid colon: Secondary | ICD-10-CM | POA: Diagnosis not present

## 2017-05-08 DIAGNOSIS — D500 Iron deficiency anemia secondary to blood loss (chronic): Secondary | ICD-10-CM

## 2017-05-08 DIAGNOSIS — C787 Secondary malignant neoplasm of liver and intrahepatic bile duct: Secondary | ICD-10-CM

## 2017-05-08 DIAGNOSIS — R110 Nausea: Secondary | ICD-10-CM | POA: Diagnosis not present

## 2017-05-08 DIAGNOSIS — I4891 Unspecified atrial fibrillation: Secondary | ICD-10-CM

## 2017-05-08 LAB — COMPREHENSIVE METABOLIC PANEL
ALK PHOS: 102 U/L (ref 40–150)
ALT: 12 U/L (ref 0–55)
AST: 18 U/L (ref 5–34)
Albumin: 3.5 g/dL (ref 3.5–5.0)
Anion Gap: 6 mEq/L (ref 3–11)
BUN: 20.9 mg/dL (ref 7.0–26.0)
CALCIUM: 9.6 mg/dL (ref 8.4–10.4)
CO2: 27 mEq/L (ref 22–29)
CREATININE: 1 mg/dL (ref 0.6–1.1)
Chloride: 105 mEq/L (ref 98–109)
EGFR: 54 mL/min/{1.73_m2} — ABNORMAL LOW (ref >90)
GLUCOSE: 96 mg/dL (ref 70–140)
Potassium: 4.7 mEq/L (ref 3.5–5.1)
SODIUM: 139 meq/L (ref 136–145)
TOTAL PROTEIN: 7.9 g/dL (ref 6.4–8.3)
Total Bilirubin: 0.59 mg/dL (ref 0.20–1.20)

## 2017-05-08 LAB — CBC WITH DIFFERENTIAL/PLATELET
BASO%: 0.4 % (ref 0.0–2.0)
BASOS ABS: 0 10*3/uL (ref 0.0–0.1)
EOS ABS: 0.2 10*3/uL (ref 0.0–0.5)
EOS%: 2.4 % (ref 0.0–7.0)
HEMATOCRIT: 36.8 % (ref 34.8–46.6)
HEMOGLOBIN: 11.9 g/dL (ref 11.6–15.9)
LYMPH%: 18 % (ref 14.0–49.7)
MCH: 29.2 pg (ref 25.1–34.0)
MCHC: 32.3 g/dL (ref 31.5–36.0)
MCV: 90.2 fL (ref 79.5–101.0)
MONO#: 1.3 10*3/uL — ABNORMAL HIGH (ref 0.1–0.9)
MONO%: 12.8 % (ref 0.0–14.0)
NEUT%: 66.4 % (ref 38.4–76.8)
NEUTROS ABS: 6.6 10*3/uL — AB (ref 1.5–6.5)
Platelets: 310 10*3/uL (ref 145–400)
RBC: 4.08 10*6/uL (ref 3.70–5.45)
RDW: 14 % (ref 11.2–14.5)
WBC: 9.9 10*3/uL (ref 3.9–10.3)
lymph#: 1.8 10*3/uL (ref 0.9–3.3)

## 2017-05-08 LAB — MAGNESIUM: MAGNESIUM: 2.5 mg/dL (ref 1.5–2.5)

## 2017-05-08 MED ORDER — TRAMADOL HCL 50 MG PO TABS: 50.0000 mg | ORAL_TABLET | Freq: Three times a day (TID) | ORAL | 0 refills | Status: DC | PRN

## 2017-05-08 MED ORDER — ONDANSETRON 4 MG PO TBDP: 4.0000 mg | ORAL_TABLET | Freq: Three times a day (TID) | ORAL | 0 refills | Status: DC | PRN

## 2017-05-08 NOTE — Telephone Encounter (Signed)
Scheduled appt per 9/27 los - Gave patient AVS and calender per los. - Central radiology to contact patient with ct schedule.

## 2017-05-08 NOTE — Progress Notes (Signed)
[facility]  [phone] [phone]  Clinic Follow Up Note   Patient Care Team: Moshe Cipro, MD as PCP - General (Internal Medicine) Alexis Frock, MD as Consulting Physician (Urology) Michael Boston, MD as Consulting Physician (General Surgery) Toma Deiters, MD as Referring Physician (Gastroenterology) 05/08/2017  CHIEF COMPLAINTS:  Metastatic sigmoid colon Adenocarcinoma  Oncology History   Cancer Staging Cancer of left colon [facility]) Staging form: Colon and Rectum, AJCC 8th Edition - Clinical stage from 09/04/2016: Stage IVB (cTX, cNX, pM1b) - Signed by Truitt Merle, MD on 11/12/2016       Cancer of left colon ([place])   09/02/2016 Procedure    Colonoscopy Diverticulosis (scattered). Pt has about 10 cm structure starting at 20 cm with abnormal mucosa. Biopsies taken.       09/02/2016 Pathology Results    MODERATELY DIFFERENTIATED ADENOCARCINOMA OF COLON AT 20 CM      09/04/2016 Initial Diagnosis    Cancer of left colon ([place])      09/06/2016 Imaging    CT CAP w Contrast 1. No acute abdominopelvic process.  2. Heterogenous hypodense area involving the medial edge of the left kidney with multiple small soft tissue like stranding involving the left pararenal space. Findings are suspicious for malignancy of the left kidney with small pararenal metastases. MRI abdomen with contrast should be considered to further evaluate.  3. Vague hypodensity involving the lateral segment of the liver. This can also be reevaluated with MRI.  4. Partially visualized 5 mm posterior right lower lobe nodular density. CT chest without contrast follow up is recommended.  5. Complex multilobular left ovarian lesion may reflect a cluster of cysts.      10/10/2016 Procedure    Operative Report by Dr. Tresa Moore on 10/18/16 Procedure: 1) Cystoscopy, left retrograde pyelogram interpretation. 2) Left diagnostic uteroscopy. 3) Insertion of left uteral stent; 5 x 24 Polaris  with tether. Findings: 1) Unremarkable bladder. 2) Unremarkable left retrograde pyelogram. 3) No evidence of intraluminal urothelial neoplasm whatsoever with insepction of the left kidney and ureter times several. 4) Successful placement of left ureteral stent, proximal in renal pelvis and distal in urinary bladder.      10/15/2016 Imaging    MRI abdomen pelvis 1. There are 2 enhancing hepatic lesions highly worrisome for metastatic disease, likely metastatic colon cancer. 2. The lesion of concern in the anterior suprahilar lip of the left kidney is also enhancing, worrisome for neoplasm. This has a somewhat atypical appearance for renal cell carcinoma and could reflect a metastasis as well. 3. New left-sided hydroureter with delayed contrast excretion consistent with distal ureteral obstruction. Specific etiology uncertain. 4. Complex cystic and solid left adnexal lesion could reflect cystic neoplasm, although does not appear aggressive or appear to reflect the cause of the distal left ureteral obstruction. 5. Sigmoid colon wall thickening could be related to reported newly diagnosed colon cancer.      11/06/2016 PET scan    PET 11/06/2016 IMPRESSION: 1. Hypermetabolic pulmonary and hepatic lesions, most consistent with metastatic colon cancer, with the hypermetabolic primary located in the sigmoid colon. 2. Left renal and left adnexal lesions, better seen and evaluated on 10/15/2016. 3. Persistent moderate left hydronephrosis. 4. Aortic atherosclerosis (ICD10-170.0).      11/11/2016 Pathology Results    Liver, needle/core biopsy, left lobe - METASTATIC ADENOCARCINOMA. The histologic features are consistent with a primary gastrointestinal adenocarcinoma.      HISTORY OF PRESENTING ILLNESS (10/25/2016):  Mary [name] 83 y.o. female is  here because of adenocarcinoma of the colon and a left renal mass. The pt noticed worsening episodes of abdominal cramping and nausea with  bowel movements in July 2017. She had an abdominal US performed, which was unremarkable. A colonoscopy was then performed at the patient's request, which found a mass about 20 cm from the anal verge. Biopsy showed moderately differentiated adenocarcinoma. MRI done, with a lesion noticed in the left kidney near the pelvis, a 11 mm hepatic lesion, 4 mm lung nodule, and possible cystic left ovarian mass. Pt saw Dr. Johney Maine for information on abdominal surgery for her colon, which she declined. She also saw Dr. Lorna Dibble, who is considering a nephrectomy, but is planning a sternoscopy to better characterize the kidney lesion. She presents today for continued treatment.    She presents today with her son and daughter-in-law. The symptoms started in late July 2017. She had some vomiting, abdominal pain, and constipation, but not bleeding. Her PCP never found any anemia. Initially she lost her appetite, but now she is taking multiple vitamins and supplements, so her appetite is back. Her bowel has improved, but she still has abdominal pain that comes and goes, lasting minutes. The pain is tolerable with aleve. She did not tolerate Tramadol well; nausea and vomiting. She has some occasional left sides back pain. Denies weight loss, or any other concerns. She is able to do everything she needs to around the house.   She had a partial hysterectomy, and a lumpectomy. She has a history of HTN, atrial fibrillation, high cholesterol. She has some hearing loss and can not see out of her right eye. Not a diabetic, but her son and her mother are. No family history of colon cancer. Her sister had breast cancer in her 22's and her brother had lung cancer in his 77's. Never smoker. Doesn't drink alcohol. 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 She has two sons; one lives an hour away, and the other lives in [place]. She lives alone.   CURRENT THERAPY: Supportive care  INTERVAL HISTORY:  Mary [name] presents in the clinic today  with her daughter-in-law for a follow up. She would like to know what to expect with her diagnosis. She reports some bleeding during bowel movements that are outside of the stool. She gets nauseous during or before bowel movements at times.  When she takes her nausea medication she sometimes vomits the pill back up. She would like to try a different type. She uses 1 cap of Miralax, once daily. When she moves, she sometimes feels pain in her left side. Her appetite is normal and she sees everything she has a taste for. Patient takes fiber-1 vitamins.  Her daughter in-law called yesterday to discuss patient's state of health. She notes patient has had an increase in pain, leg swelling and nausea more frequently.   MEDICAL HISTORY:  Past Medical History:  Diagnosis Date  . Aneurysm of ascending aorta (HCC) 12/03/2016   3.7 cm by echo 10/2016  . Colon cancer [facility])    dx via biospy from colonoscopy-- malignant ---  currently having work-up done w/ dr gross (general surgeon) and coordinate surgery w/ urologsit for renal tumor  . Dysrhythmia    A  Fib  . Essential hypertension 10/31/2016  . GERD (gastroesophageal reflux disease)   . Glaucoma   . Hiatal hernia   . HOH (hard of hearing)    bilateral even w/ hearing aids  . Hyperlipidemia   . Hypertension   . Kidney  tumor    left side  . Legally blind in right eye, as defined in [place]    due to macular degeneration  . Macular degeneration   . Nausea    intermittant due to colon mass  . Ovarian cyst   . Persistent atrial fibrillation ([place]) 10/31/2016  . PONV (postoperative nausea and vomiting)   . Wears glasses   . Wears hearing aid    bilateral    SURGICAL HISTORY: Past Surgical History:  Procedure Laterality Date  . BREAST CYST EXCISION  1990   benign  . COLONOSCOPY  09/04/2016  . CYSTOSCOPY WITH RETROGRADE PYELOGRAM, URETEROSCOPY AND STENT PLACEMENT Left 10/10/2016   Procedure: CYSTOSCOPY WITH RETROGRADE PYELOGRAM, DIAGNOSTIC URETEROSCOPY   AND STENT PLACEMENT;  Surgeon: Alexis Frock, MD;  Location: [facility];  Service: Urology;  Laterality: Left;  . ESOPHAGOGASTRODUODENOSCOPY  11/07/2014  . NASAL SINUS SURGERY    . TONSILLECTOMY      SOCIAL HISTORY: Social History   Social History  . Marital status: Widowed    Spouse name: N/A  . Number of children: N/A  . Years of education: N/A   Occupational History  . Not on file.   Social History Main Topics  . Smoking status: Never Smoker  . Smokeless tobacco: Never Used  . Alcohol use No  . Drug use: No  . Sexual activity: Not on file   Other Topics Concern  . Not on file   Social History Narrative  . No narrative on file    FAMILY HISTORY: Family History  Problem Relation Age of Onset  . Cancer Sister 22       breast cancer   . Cancer Brother 10       lung cancer   . Diabetes Son   . High blood pressure Son   . Peripheral Artery Disease Mother   . High blood pressure Mother     ALLERGIES:  is allergic to benazepril and bactrim [sulfamethoxazole-trimethoprim].  MEDICATIONS:  Current Outpatient Prescriptions  Medication Sig Dispense Refill  . alendronate (FOSAMAX) 70 MG tablet Take 70 mg by mouth once a week. Take with a full glass of water on an empty stomach.    Marland Kitchen amLODipine (NORVASC) 5 MG tablet Take 1 tablet (5 mg total) by mouth daily. 90 tablet 1  . diltiazem (CARDIZEM CD) 240 MG 24 hr capsule Take 1 capsule (240 mg total) by mouth daily. 90 capsule 1  . escitalopram (LEXAPRO) 10 MG tablet Take 10 mg by mouth daily.    . fexofenadine (ALLEGRA) 180 MG tablet Take 180 mg by mouth daily.    . furosemide (LASIX) 40 MG tablet Take 1 tablet (40 mg total) by mouth daily. 90 tablet 1  . latanoprost (XALATAN) 0.005 % ophthalmic solution Place 1 drop into both eyes at bedtime.    Marland Kitchen losartan (COZAAR) 100 MG tablet Take 1 tablet (100 mg total) by mouth daily. 90 tablet 1  . Multiple Vitamins-Minerals (VITEYES AREDS ADVANCED) CAPS Take 1  capsule by mouth daily.    Marland Kitchen omeprazole (PRILOSEC) 40 MG capsule Take 40 mg by mouth daily.    . ondansetron (ZOFRAN) 8 MG tablet Take 1 tablet (8 mg total) by mouth every 8 (eight) hours as needed for nausea. 30 tablet 2  . Polyethyl Glycol-Propyl Glycol (SYSTANE) 0.4-0.3 % SOLN Apply to eye as needed.    . traMADol (ULTRAM) 50 MG tablet Take 1 tablet (50 mg total) by mouth 3 (three) times daily as needed.  For increased pain. 270 tablet 0  . ondansetron (ZOFRAN ODT) 4 MG disintegrating tablet Take 1 tablet (4 mg total) by mouth every 8 (eight) hours as needed for nausea or vomiting. 20 tablet 0   Current Facility-Administered Medications  Medication Dose Route Frequency Provider Last Rate Last Dose  . 0.9 %  sodium chloride infusion  250 mL Intravenous Continuous Skeet Latch, MD      . sodium chloride flush (NS) 0.9 % injection 3 mL  3 mL Intravenous Q12H Skeet Latch, MD      . sodium chloride flush (NS) 0.9 % injection 3 mL  3 mL Intravenous PRN Skeet Latch, MD        REVIEW OF SYSTEMS:   Constitutional: Denies fevers, chills or abnormal night sweats (+) Fatigue  Eyes: Denies blurriness of vision, double vision or watery eyes Ears, nose, mouth, throat, and face: Denies mucositis or sore throat Respiratory: Denies cough, dyspnea or wheezes Cardiovascular: Denies palpitation, chest discomfort (+) feet and leg swelling Gastrointestinal:  Denies heartburn  (+) Constipation (+) occasional nausea Skin: Denies abnormal skin rashes Lymphatics: Denies new lymphadenopathy or easy bruising MSK:  Neurological:Denies numbness, tingling or new weaknesses Behavioral/Psych: Mood is stable, no new changes  All other systems were reviewed with the patient and are negative.  PHYSICAL EXAMINATION:  ECOG PERFORMANCE STATUS: 1 - Symptomatic but completely ambulatory  Vitals:   05/08/17 1008  BP: 111/70  Pulse: 74  Resp: 18  Temp: 98 F (36.7 C)  SpO2: 100%   Filed Weights    05/08/17 1008  Weight: 181 lb 4.8 oz (82.2 kg)    GENERAL:alert, no distress and comfortable SKIN: skin color, texture, turgor are normal, no rashes or significant lesions EYES: normal, conjunctiva are pink and non-injected, sclera clear OROPHARYNX:no exudate, no erythema and lips, buccal mucosa, and tongue normal  NECK: supple, thyroid normal size, non-tender, without nodularity LYMPH:  no palpable lymphadenopathy in the cervical, axillary or inguinal LUNGS: clear to auscultation and percussion with normal breathing effort HEART: regular rate & rhythm and no murmurs and (+)symmetrical lower extremity edema  ABDOMEN:abdomen soft, non-tender and normal bowel sounds Musculoskeletal:no cyanosis of digits and no clubbing  PSYCH: alert & oriented x 3 with fluent speech NEURO: no focal motor/sensory deficits   LABORATORY DATA:  I have reviewed the data as listed CBC Latest Ref Rng & Units 05/08/2017 02/26/2017 01/08/2017  WBC 3.9 - 10.3 10e3/uL 9.9 12.5(H) 11.6(H)  Hemoglobin 11.6 - 15.9 g/dL 11.9 12.0 12.1  Hematocrit 34.8 - 46.6 % 36.8 37.3 37.2  Platelets 145 - 400 10e3/uL 310 360 367   CMP Latest Ref Rng & Units 05/08/2017 02/26/2017 01/08/2017  Glucose 70 - 140 mg/dl 96 81 88  BUN 7.0 - 26.0 mg/dL 20.9 19 20   Creatinine 0.6 - 1.1 mg/dL 1.0 0.88 1.09(H)  Sodium 136 - 145 mEq/L 139 140 137  Potassium 3.5 - 5.1 mEq/L 4.7 4.9 4.0  Chloride 96 - 106 mmol/L - 99 97  CO2 22 - 29 mEq/L 27 25 25   Calcium 8.4 - 10.4 mg/dL 9.6 10.0 9.7  Total Protein 6.4 - 8.3 g/dL 7.9 - -  Total Bilirubin 0.20 - 1.20 mg/dL 0.59 - -  Alkaline Phos 40 - 150 U/L 102 - -  AST 5 - 34 U/L 18 - -  ALT 0 - 55 U/L 12 - -   PATHOLOGY: [facility] 11/11/16   Diagnosis 11/11/16 Liver, needle/core biopsy, left lobe - METASTATIC ADENOCARCINOMA. - SEE COMMENT. Microscopic Comment  The histologic features are consistent with a primary gastrointestinal adenocarcinoma. Dr. Vicente Males has reviewed the case and concurs  with this interpretation. There is likely sufficient tumor present for additional studies, if requested. Enid Cutter MD Pathologist, Electronic Signature (Case signed 11/12/2016)  Diagnosis 09/04/2016 MODERATELY DIFFERENTIATED ADENOCARCINOMA OF COLON AT 20 CM Tumor Site: Colon at 20 cm Tumor Type: Adenocarcinoma Tumore Size: Unknown  Grade: Moderately differentiated   RADIOGRAPHIC STUDIES: I have personally reviewed the radiological images as listed and agreed with the findings in the report.  PET 11/06/2016 IMPRESSION: 1. Hypermetabolic pulmonary and hepatic lesions, most consistent with metastatic colon cancer, with the hypermetabolic primary located in the sigmoid colon. 2. Left renal and left adnexal lesions, better seen and evaluated on 10/15/2016. 3. Persistent moderate left hydronephrosis. 4. Aortic atherosclerosis (ICD10-170.0).  MRI Abdomen Pelvis w/wo Contrast 10/15/2016 IMPRESSION: 1. There are 2 enhancing hepatic lesions highly worrisome for metastatic disease, likely metastatic colon cancer. 2. The lesion of concern in the anterior suprahilar lip of the left kidney is also enhancing, worrisome for neoplasm. This has a somewhat atypical appearance for renal cell carcinoma and could reflect a metastasis as well. 3. New left-sided hydroureter with delayed contrast excretion consistent with distal ureteral obstruction. Specific etiology uncertain. 4. Complex cystic and solid left adnexal lesion could reflect cystic neoplasm, although does not appear aggressive or appear to reflect the cause of the distal left ureteral obstruction. 5. Sigmoid colon wall thickening could be related to reported newly diagnosed colon cancer.  CT CAP w Contrast 09/06/2016 (done outside) IMPRESSION: 1. No acute abdominopelvic process.  2. Heterogenous hypodense area involving the medial edge of the left kidney with multiple small soft tissue like stranding involving the left pararenal  space. Findings are suspicious for malignancy of the left kidney with small pararenal metastases. MRI abdomen with contrast should be considered to further evaluate.  3. Vague hypodensity involving the lateral segment of the liver. This can also be reevaluated with MRI.  4. Partially visualized 5 mm posterior right lower lobe nodular density. CT chest without contrast follow up is recommended.  5. Complex multilobular left ovarian lesion may reflect a cluster of cysts.   Colonoscopy 09/02/2016 FINDINGS: Diverticulosis (scattered). Pt has about 10 cm structure starting at 20 cm with abnormal mucosa. The colonoscopy scope was not able to advance due to structure, and upper endoscopy scope was used and it was advanced through the stricture to see ileocecal valve. Biopsies at the stricture were taken.   PROCEDURES Echocardiogram 11/03/16 - Left ventricle: The cavity size was normal. Wall thickness was   increased in a pattern of mild LVH. Indeterminant diastolic   function (atrial fibrillation). Systolic function was normal. The   estimated ejection fraction was in the range of 60% to 65%.   Operative Report by Dr. Tresa Moore on 10/10/16 Procedure: 1) Cystoscopy, left retrograde pyelogram interpretation. 2) Left diagnostic uteroscopy. 3) Insertion of left uteral stent; 5 x 24 Polaris with tether. Findings: 1) Unremarkable bladder. 2) Unremarkable left retrograde pyelogram. 3) No evidence of intraluminal urothelial neoplasm whatsoever with insepction of the left kidney and ureter times several. 4) Successful placement of left ureteral stent, proximal in renal pelvis and distal in urinary bladder.  ASSESSMENT & PLAN:  Ruther is a 83 y.o. female   1. Adenocarcinoma of left colon, sigmoid colon, TxNxM1 with liver and lung mets, stage IV, KRAS/NRS wild type, MSI-stable  -I previously discussed the imaging, colonoscopy and biopsy results with the patient and her family in  detail.  -She has seen Drs. Manny  and Gross, surgical resections were discussed.  -PET scan on 11/06/16 showed hypermetabolic pulmonary and hepatic lesions consistent with metastatic colon cancer with the primary located in the sigmoid colon. I reviewed the images with pt and her family members in person  -US biopsy on 11/11/16 of the left lobe of the liver revealed metastatic adenocarcinoma. The features are consistent with a primary gastrointestinal adenocarcinoma. I discussed with pt  -We previously discussed that her metastatic cancer is incurable at this stage due to the multiorgan metastasis, and her advanced age. The goal of therapy is palliative. -We discussed the indication for surgery, including significant bleeding, obstruction, or perforation. She does have mild constipation and intermittent abdominal cramps, but no significant bowel obstruction. Patient understands the surgery is palliative. She prefers to avoid surgery at this point.  -We reviewed [facility] test results, which showed MSI- stable, no KRAS/NRAS or BRAF mutations. Based on this, she is not a candidate for immunotherapy, but she will likely benefit from EGFR inhibitor, giving the left side colon cancer. --The [facility] results showed she is not a candidate for immunotherapy, but she is a candidate for EGFR antibody therapy. -We discussed that the prognosis of left side colon cancer without KRAS/NRAS and BRAF mutations is much better than right side colon cancer, especially with EGFR antibody treatment. -We discussed the options of systemic therapy. Due to her advanced age, she is not a candidate for intensive chemotherapy. I recommend that panitumumab, an EGFR inhibitor with low intensity chemo (5-FU infusion or Xeloda to control her cancer. -We discussed alternative management options with palliative care alone, which will be also appropriate given her advanced age and medical comorbidities. -Patient initially agreed with low intensity chemotherapy, but  has changed her mind, she is concerned more about the quality of life then the quality of life. We again had a long conversation about the benefit and potential side effects from chemotherapy. She decided to wait and see at this point. I think is reasonable given her advanced age and relatively not very symptomatic from her cancer.  -She has recently developed worsening abdominal pain, constipation and nausea, very concerning for bowel obstruction from her sigmoid colon cancer. I recommended she increase Miralax and Colace.  - I recommended low residual, low fiber diet  Patient would like to know her progression. I recommend a CT Scan,  - Due to the continue growth of her tumor, her bowels will eventually become obstructed. If she can not eat or pass bowel but would like to preserve her quality of life, She will have to receive a colostomy bag.  -we also discussed hospice, pt would like to wait until CT scan and we know more about her prognosis   2. Left renal mass and left hydronephrosis -Possible renal cell carcinoma, metastatic lesion from colon cancer is also a possibility. -She has been seen by [facility], Dr. Tresa Moore, and had left ureter stent placement on 10/10/2016.  3. Complex cyst of left ovary -will monitor. Likely benign -If she undergo abdominal surgery, may remove it -She declines surgery for now  4. Atrial Fibrillation -The patient has a history of atrial fibrillation. -Her cardiologist wants her to undergo cardio inversion and anticoagulation. -I advised the patient that even if she undergoes cardio inversion, her Afib might return. Also, her anticoagulation medication might make her bleeding worse. -The patient should continue taking baby aspirin.  5. HTN -Continue medications and follow up with primary care physician  6. Nutrition -We discussed palliative chemotherapy will make her fatigued, nauseous, and loss of appetite. -I stressed the importance of a healthy diet with plenty of  protein. -I will make a referral to Nutrition.  7. Left flank and abdominal pain -The patient is taking Aleve for pain. -I prescribed Tylenol #3 on 11/13/16. -The patient discontinued Tylenol #3 due to chest discomfort and headaches. -she may try tramadol, she is able to tolerate tramadol   8. Anemia -The patient's Hb is 11.8 on 4.4 and has decreased from 12.3 on 10/31/16 and 13.9 on 10/10/16. This is blood loss from her tumor. -I advised the patient to take an baby dose iron pills and eat iron rich foods. -She is to drink plenty of fluids to prevent constipation.  9. Goal of care discussion  -We again discussed the incurable nature of her cancer, and the overall poor prognosis, especially if she does not have good response to chemotherapy or progress on chemo -The patient understands the goal of care is palliative. -I recommend DNR/DNI, she will think about it   10. LE swelling -She is worsening with swelling in lower left leg and both feet  -she wear compression stocks    Plan - CT adb/pel with contrast next week with f/u - start low fiber diet. I will provide handout - prescribe Zofran ODT today - refill 38m Tramadol for 3 months  All questions were answered. The patient knows to call the clinic with any problems, questions or concerns.  Post patient and her family members had many questions, I answered to their satisfaction. I spent 35 minutes counseling the patient face to face. The total time spent in the appointment was 40 minutes and more than 50% was on counseling.   FTruitt Merle MD 05/08/2017   This document serves as a record of services personally performed by YTruitt Merle MD. It was created on her behalf by TBrandt Loosen a trained medical scribe. The creation of this record is based on the scribe's personal observations and the provider's statements to them. This document has been checked and approved by the attending provider.

## 2017-05-19 ENCOUNTER — Ambulatory Visit (HOSPITAL_COMMUNITY)
Admission: RE | Admit: 2017-05-19 | Discharge: 2017-05-19 | Disposition: A | Discharge status: Home / Self Care | Payer: Medicare Other | Source: Ambulatory Visit | Attending: Hematology | Admitting: Hematology

## 2017-05-19 ENCOUNTER — Encounter (HOSPITAL_COMMUNITY): Payer: Self-pay

## 2017-05-19 DIAGNOSIS — C189 Malignant neoplasm of colon, unspecified: Secondary | ICD-10-CM | POA: Diagnosis not present

## 2017-05-19 DIAGNOSIS — C7800 Secondary malignant neoplasm of unspecified lung: Secondary | ICD-10-CM | POA: Insufficient documentation

## 2017-05-19 DIAGNOSIS — I700 Atherosclerosis of aorta: Secondary | ICD-10-CM | POA: Diagnosis not present

## 2017-05-19 DIAGNOSIS — C186 Malignant neoplasm of descending colon: Secondary | ICD-10-CM

## 2017-05-19 DIAGNOSIS — K8020 Calculus of gallbladder without cholecystitis without obstruction: Secondary | ICD-10-CM | POA: Diagnosis not present

## 2017-05-19 DIAGNOSIS — R935 Abnormal findings on diagnostic imaging of other abdominal regions, including retroperitoneum: Secondary | ICD-10-CM | POA: Diagnosis not present

## 2017-05-19 DIAGNOSIS — C787 Secondary malignant neoplasm of liver and intrahepatic bile duct: Secondary | ICD-10-CM | POA: Diagnosis not present

## 2017-05-19 MED ORDER — IOPAMIDOL (ISOVUE-300) INJECTION 61%
INTRAVENOUS | Status: AC
  Filled 2017-05-19: qty 100

## 2017-05-19 MED ORDER — IOPAMIDOL (ISOVUE-300) INJECTION 61%
100.0000 mL | Freq: Once | INTRAVENOUS | Status: AC | PRN
  Administered 2017-05-19: 100 mL via INTRAVENOUS

## 2017-05-19 NOTE — Progress Notes (Signed)
[place]  [phone] [phone]  Clinic Follow Up Note   Patient Care Team: Moshe Cipro, MD as PCP - General (Internal Medicine) Alexis Frock, MD as Consulting Physician (Urology) Michael Boston, MD as Consulting Physician (General Surgery) Toma Deiters, MD as Referring Physician (Gastroenterology) 05/20/2017  CHIEF COMPLAINTS:  Metastatic sigmoid colon Adenocarcinoma  Oncology History   Cancer Staging Cancer of left colon [facility]) Staging form: Colon and Rectum, AJCC 8th Edition - Clinical stage from 09/04/2016: Stage IVB (cTX, cNX, pM1b) - Signed by Truitt Merle, MD on 11/12/2016       Cancer of left colon ([place])   09/02/2016 Procedure    Colonoscopy Diverticulosis (scattered). Pt has about 10 cm structure starting at 20 cm with abnormal mucosa. Biopsies taken.       09/02/2016 Pathology Results    MODERATELY DIFFERENTIATED ADENOCARCINOMA OF COLON AT 20 CM      09/04/2016 Initial Diagnosis    Cancer of left colon ([place])      09/06/2016 Imaging    CT CAP w Contrast 1. No acute abdominopelvic process.  2. Heterogenous hypodense area involving the medial edge of the left kidney with multiple small soft tissue like stranding involving the left pararenal space. Findings are suspicious for malignancy of the left kidney with small pararenal metastases. MRI abdomen with contrast should be considered to further evaluate.  3. Vague hypodensity involving the lateral segment of the liver. This can also be reevaluated with MRI.  4. Partially visualized 5 mm posterior right lower lobe nodular density. CT chest without contrast follow up is recommended.  5. Complex multilobular left ovarian lesion may reflect a cluster of cysts.      10/10/2016 Procedure    Operative Report by Dr. Tresa Moore on 10/18/16 Procedure: 1) Cystoscopy, left retrograde pyelogram interpretation. 2) Left diagnostic uteroscopy. 3) Insertion of left uteral stent; 5 x 24 Polaris  with tether. Findings: 1) Unremarkable bladder. 2) Unremarkable left retrograde pyelogram. 3) No evidence of intraluminal urothelial neoplasm whatsoever with insepction of the left kidney and ureter times several. 4) Successful placement of left ureteral stent, proximal in renal pelvis and distal in urinary bladder.      10/15/2016 Imaging    MRI abdomen pelvis 1. There are 2 enhancing hepatic lesions highly worrisome for metastatic disease, likely metastatic colon cancer. 2. The lesion of concern in the anterior suprahilar lip of the left kidney is also enhancing, worrisome for neoplasm. This has a somewhat atypical appearance for renal cell carcinoma and could reflect a metastasis as well. 3. New left-sided hydroureter with delayed contrast excretion consistent with distal ureteral obstruction. Specific etiology uncertain. 4. Complex cystic and solid left adnexal lesion could reflect cystic neoplasm, although does not appear aggressive or appear to reflect the cause of the distal left ureteral obstruction. 5. Sigmoid colon wall thickening could be related to reported newly diagnosed colon cancer.      11/06/2016 PET scan    PET 11/06/2016 IMPRESSION: 1. Hypermetabolic pulmonary and hepatic lesions, most consistent with metastatic colon cancer, with the hypermetabolic primary located in the sigmoid colon. 2. Left renal and left adnexal lesions, better seen and evaluated on 10/15/2016. 3. Persistent moderate left hydronephrosis. 4. Aortic atherosclerosis (ICD10-170.0).      11/11/2016 Pathology Results    Liver, needle/core biopsy, left lobe - METASTATIC ADENOCARCINOMA. The histologic features are consistent with a primary gastrointestinal adenocarcinoma.      05/19/2017 Imaging    CT Abdomen Pelvis, 05/19/2017 IMPRESSION: 1. Worsening pulmonary  and hepatic metastatic disease. Primary sigmoid lesion is stable to minimally enlarged. 2. Stable to slight enlargement of a left  perinephric mass, likely a metastasis. Renal cell carcinoma is not excluded. Associated pelvocaliectasis versus mild hydronephrosis. 3. Borderline left periaortic lymph nodes, slightly enlarged. 4. Cholelithiasis. 5.  Aortic atherosclerosis (ICD10-170.0). 6. Cystic and solid lesion in the left adnexa, as on prior exams, likely ovarian in origin.       Chemotherapy    PENDING IV antibody Panitumumab every [redacted] weeks along with oral chemo Xeloda for 1 week on and 1 week off starting 06/06/17       HISTORY OF PRESENTING ILLNESS (10/25/2016):  Mary [name] 83 y.o. female is here because of adenocarcinoma of the colon and a left renal mass. The pt noticed worsening episodes of abdominal cramping and nausea with bowel movements in July 2017. She had an abdominal US performed, which was unremarkable. A colonoscopy was then performed at the patient's request, which found a mass about 20 cm from the anal verge. Biopsy showed moderately differentiated adenocarcinoma. MRI done, with a lesion noticed in the left kidney near the pelvis, a 11 mm hepatic lesion, 4 mm lung nodule, and possible cystic left ovarian mass. Pt saw Dr. Johney Maine for information on abdominal surgery for her colon, which she declined. She also saw Dr. Lorna Dibble, who is considering a nephrectomy, but is planning a sternoscopy to better characterize the kidney lesion. She presents today for continued treatment.    She presents today with her son and daughter-in-law. The symptoms started in late July 2017. She had some vomiting, abdominal pain, and constipation, but not bleeding. Her PCP never found any anemia. Initially she lost her appetite, but now she is taking multiple vitamins and supplements, so her appetite is back. Her bowel has improved, but she still has abdominal pain that comes and goes, lasting minutes. The pain is tolerable with aleve. She did not tolerate Tramadol well; nausea and vomiting. She has some occasional left sides back  pain. Denies weight loss, or any other concerns. She is able to do everything she needs to around the house.   She had a partial hysterectomy, and a lumpectomy. She has a history of HTN, atrial fibrillation, high cholesterol. She has some hearing loss and can not see out of her right eye. Not a diabetic, but her son and her mother are. No family history of colon cancer. Her sister had breast cancer in her 34's and her brother had lung cancer in his 13's. Never smoker. Doesn't drink alcohol. 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 She has two sons; one lives an hour away, and the other lives in [place]. She lives alone.   CURRENT THERAPY: PENDING IV antibody Panitumumab every [redacted] weeks along with oral chemo Xeloda for 1 week on and 1 week off starting 06/06/17  INTERVAL HISTORY:   Mary [name] is here for a follow up. She presents in the clinic today accompanied her daughter-in-law.  She notes to feeling pretty well overall. She notes abdominal discomfort and more fatigued. Her BMs are daily. She uses [place] and MiraLAX along with low fiber diet. She is seeing the nutritionist later today. She opted in for flu shot today. She wanted to discuss her possible prognosis with and without treatment. It was discussed at length and to her understanding.    MEDICAL HISTORY:  Past Medical History:  Diagnosis Date  . Aneurysm of ascending aorta (HCC) 12/03/2016   3.7 cm by echo 10/2016  .  Colon cancer [facility])    dx via biospy from colonoscopy-- malignant ---  currently having work-up done w/ dr gross (general surgeon) and coordinate surgery w/ urologsit for renal tumor  . Dysrhythmia    A  Fib  . Essential hypertension 10/31/2016  . GERD (gastroesophageal reflux disease)   . Glaucoma   . Hiatal hernia   . HOH (hard of hearing)    bilateral even w/ hearing aids  . Hyperlipidemia   . Hypertension   . Kidney tumor    left side  . Legally blind in right eye, as defined in [place]    due to macular  degeneration  . Macular degeneration   . Nausea    intermittant due to colon mass  . Ovarian cyst   . Persistent atrial fibrillation ([place]) 10/31/2016  . PONV (postoperative nausea and vomiting)   . Wears glasses   . Wears hearing aid    bilateral    SURGICAL HISTORY: Past Surgical History:  Procedure Laterality Date  . BREAST CYST EXCISION  1990   benign  . COLONOSCOPY  09/04/2016  . CYSTOSCOPY WITH RETROGRADE PYELOGRAM, URETEROSCOPY AND STENT PLACEMENT Left 10/10/2016   Procedure: CYSTOSCOPY WITH RETROGRADE PYELOGRAM, DIAGNOSTIC URETEROSCOPY  AND STENT PLACEMENT;  Surgeon: Alexis Frock, MD;  Location: [facility];  Service: Urology;  Laterality: Left;  . ESOPHAGOGASTRODUODENOSCOPY  11/07/2014  . NASAL SINUS SURGERY    . TONSILLECTOMY      SOCIAL HISTORY: Social History   Social History  . Marital status: Widowed    Spouse name: N/A  . Number of children: N/A  . Years of education: N/A   Occupational History  . Not on file.   Social History Main Topics  . Smoking status: Never Smoker  . Smokeless tobacco: Never Used  . Alcohol use No  . Drug use: No  . Sexual activity: Not on file   Other Topics Concern  . Not on file   Social History Narrative  . No narrative on file    FAMILY HISTORY: Family History  Problem Relation Age of Onset  . Cancer Sister 82       breast cancer   . Cancer Brother 49       lung cancer   . Diabetes Son   . High blood pressure Son   . Peripheral Artery Disease Mother   . High blood pressure Mother     ALLERGIES:  is allergic to benazepril and bactrim [sulfamethoxazole-trimethoprim].  MEDICATIONS:  Current Outpatient Prescriptions  Medication Sig Dispense Refill  . alendronate (FOSAMAX) 70 MG tablet Take 70 mg by mouth once a week. Take with a full glass of water on an empty stomach.    . diltiazem (CARDIZEM CD) 240 MG 24 hr capsule Take 1 capsule (240 mg total) by mouth daily. 90 capsule 1  . escitalopram  (LEXAPRO) 10 MG tablet Take 10 mg by mouth daily.    . fexofenadine (ALLEGRA) 180 MG tablet Take 180 mg by mouth daily.    . furosemide (LASIX) 40 MG tablet Take 1 tablet (40 mg total) by mouth daily. 90 tablet 1  . latanoprost (XALATAN) 0.005 % ophthalmic solution Place 1 drop into both eyes at bedtime.    Marland Kitchen losartan (COZAAR) 100 MG tablet Take 1 tablet (100 mg total) by mouth daily. 90 tablet 1  . Multiple Vitamins-Minerals (VITEYES AREDS ADVANCED) CAPS Take 1 capsule by mouth daily.    Marland Kitchen omeprazole (PRILOSEC) 40 MG capsule Take 40 mg by  mouth daily.    . ondansetron (ZOFRAN) 8 MG tablet Take 1 tablet (8 mg total) by mouth every 8 (eight) hours as needed for nausea. 30 tablet 2  . Polyethyl Glycol-Propyl Glycol (SYSTANE) 0.4-0.3 % SOLN Apply to eye as needed.    . traMADol (ULTRAM) 50 MG tablet Take 1 tablet (50 mg total) by mouth 3 (three) times daily as needed. For increased pain. 270 tablet 0  . amLODipine (NORVASC) 5 MG tablet Take 1 tablet (5 mg total) by mouth daily. (Patient not taking: Reported on 05/20/2017) 90 tablet 1  . capecitabine (XELODA) 500 MG tablet Take 4 tablets (2,000 mg total) by mouth 2 (two) times daily after a meal. 7 days on and 7 days off 56 tablet 3  . ondansetron (ZOFRAN ODT) 4 MG disintegrating tablet Take 1 tablet (4 mg total) by mouth every 8 (eight) hours as needed for nausea or vomiting. (Patient not taking: Reported on 05/20/2017) 20 tablet 0   Current Facility-Administered Medications  Medication Dose Route Frequency Provider Last Rate Last Dose  . 0.9 %  sodium chloride infusion  250 mL Intravenous Continuous Skeet Latch, MD      . sodium chloride flush (NS) 0.9 % injection 3 mL  3 mL Intravenous Q12H Skeet Latch, MD      . sodium chloride flush (NS) 0.9 % injection 3 mL  3 mL Intravenous PRN Skeet Latch, MD        REVIEW OF SYSTEMS:   Constitutional: Denies fevers, chills or abnormal night sweats  Eyes: Denies blurriness of vision, double  vision or watery eyes Ears, nose, mouth, throat, and face: Denies mucositis or sore throat Respiratory: Denies cough, dyspnea or wheezes Cardiovascular: Denies palpitation, chest discomfort  Gastrointestinal:  Denies heartburn (+) abdominal discomfort  Skin: Denies abnormal skin rashes Lymphatics: Denies new lymphadenopathy or easy bruising MSK: negative Neurological:Denies numbness, tingling or new weaknesses Behavioral/Psych: Mood is stable, no new changes  All other systems were reviewed with the patient and are negative.  PHYSICAL EXAMINATION:  ECOG PERFORMANCE STATUS: 1 - Symptomatic but completely ambulatory  Vitals:   05/20/17 0941  BP: (!) 134/59  Pulse: 75  Resp: 20  Temp: 98.6 F (37 C)  SpO2: 98%   Filed Weights   05/20/17 0941  Weight: 179 lb (81.2 kg)     * GENERAL:alert, no distress and comfortable SKIN: skin color, texture, turgor are normal, no rashes or significant lesions EYES: normal, conjunctiva are pink and non-injected, sclera clear OROPHARYNX:no exudate, no erythema and lips, buccal mucosa, and tongue normal  NECK: supple, thyroid normal size, non-tender, without nodularity LYMPH:  no palpable lymphadenopathy in the cervical, axillary or inguinal LUNGS: clear to auscultation and percussion with normal breathing effort HEART: regular rate & rhythm and no murmurs and (+)symmetrical lower extremity edema  ABDOMEN:abdomen soft, non-tender and normal bowel sounds Musculoskeletal:no cyanosis of digits and no clubbing  PSYCH: alert & oriented x 3 with fluent speech NEURO: no focal motor/sensory deficits   LABORATORY DATA:  I have reviewed the data as listed CBC Latest Ref Rng & Units 05/08/2017 02/26/2017 01/08/2017  WBC 3.9 - 10.3 10e3/uL 9.9 12.5(H) 11.6(H)  Hemoglobin 11.6 - 15.9 g/dL 11.9 12.0 12.1  Hematocrit 34.8 - 46.6 % 36.8 37.3 37.2  Platelets 145 - 400 10e3/uL 310 360 367   CMP Latest Ref Rng & Units 05/08/2017 02/26/2017 01/08/2017  Glucose  70 - 140 mg/dl 96 81 88  BUN 7.0 - 26.0 mg/dL 20.9 19 20  Creatinine 0.6 - 1.1 mg/dL 1.0 0.88 1.09(H)  Sodium 136 - 145 mEq/L 139 140 137  Potassium 3.5 - 5.1 mEq/L 4.7 4.9 4.0  Chloride 96 - 106 mmol/L - 99 97  CO2 22 - 29 mEq/L _0 Calcium 8.4 - 10.4 mg/dL 9.6 10.0 9.7  Total Protein 6.4 - 8.3 g/dL 7.9 - -  Total Bilirubin 0.20 - 1.20 mg/dL 0.59 - -  Alkaline Phos 40 - 150 U/L 102 - -  AST 5 - 34 U/L 18 - -  ALT 0 - 55 U/L 12 - -   PATHOLOGY: [facility] 11/11/16   Diagnosis 11/11/16 Liver, needle/core biopsy, left lobe - METASTATIC ADENOCARCINOMA. - SEE COMMENT. Microscopic Comment The histologic features are consistent with a primary gastrointestinal adenocarcinoma. Dr. Vicente Males has reviewed the case and concurs with this interpretation. There is likely sufficient tumor present for additional studies, if requested. Enid Cutter MD Pathologist, Electronic Signature (Case signed 11/12/2016)  Diagnosis 09/04/2016 MODERATELY DIFFERENTIATED ADENOCARCINOMA OF COLON AT 20 CM Tumor Site: Colon at 20 cm Tumor Type: Adenocarcinoma Tumore Size: Unknown  Grade: Moderately differentiated   RADIOGRAPHIC STUDIES: I have personally reviewed the radiological images as listed and agreed with the findings in the report.  CT Abdomen Pelvis, 05/19/2017 IMPRESSION: 1. Worsening pulmonary and hepatic metastatic disease. Primary sigmoid lesion is stable to minimally enlarged. 2. Stable to slight enlargement of a left perinephric mass, likely a metastasis. Renal cell carcinoma is not excluded. Associated pelvocaliectasis versus mild hydronephrosis. 3. Borderline left periaortic lymph nodes, slightly enlarged. 4. Cholelithiasis. 5.  Aortic atherosclerosis (ICD10-170.0). 6. Cystic and solid lesion in the left adnexa, as on prior exams, likely ovarian in origin.  PET 11/06/2016 IMPRESSION: 1. Hypermetabolic pulmonary and hepatic lesions, most consistent with metastatic colon  cancer, with the hypermetabolic primary located in the sigmoid colon. 2. Left renal and left adnexal lesions, better seen and evaluated on 10/15/2016. 3. Persistent moderate left hydronephrosis. 4. Aortic atherosclerosis (ICD10-170.0).  MRI Abdomen Pelvis w/wo Contrast 10/15/2016 IMPRESSION: 1. There are 2 enhancing hepatic lesions highly worrisome for metastatic disease, likely metastatic colon cancer. 2. The lesion of concern in the anterior suprahilar lip of the left kidney is also enhancing, worrisome for neoplasm. This has a somewhat atypical appearance for renal cell carcinoma and could reflect a metastasis as well. 3. New left-sided hydroureter with delayed contrast excretion consistent with distal ureteral obstruction. Specific etiology uncertain. 4. Complex cystic and solid left adnexal lesion could reflect cystic neoplasm, although does not appear aggressive or appear to reflect the cause of the distal left ureteral obstruction. 5. Sigmoid colon wall thickening could be related to reported newly diagnosed colon cancer.  CT CAP w Contrast 09/06/2016 (done outside) IMPRESSION: 1. No acute abdominopelvic process.  2. Heterogenous hypodense area involving the medial edge of the left kidney with multiple small soft tissue like stranding involving the left pararenal space. Findings are suspicious for malignancy of the left kidney with small pararenal metastases. MRI abdomen with contrast should be considered to further evaluate.  3. Vague hypodensity involving the lateral segment of the liver. This can also be reevaluated with MRI.  4. Partially visualized 5 mm posterior right lower lobe nodular density. CT chest without contrast follow up is recommended.  5. Complex multilobular left ovarian lesion may reflect a cluster of cysts.   Colonoscopy 09/02/2016 FINDINGS: Diverticulosis (scattered). Pt has about 10 cm structure starting at 20 cm with abnormal mucosa. The colonoscopy scope  was not able to  advance due to structure, and upper endoscopy scope was used and it was advanced through the stricture to see ileocecal valve. Biopsies at the stricture were taken.   PROCEDURES Echocardiogram 11/03/16 - Left ventricle: The cavity size was normal. Wall thickness was   increased in a pattern of mild LVH. Indeterminant diastolic   function (atrial fibrillation). Systolic function was normal. The   estimated ejection fraction was in the range of 60% to 65%.   Operative Report by Dr. Tresa Moore on 10/10/16 Procedure: 1) Cystoscopy, left retrograde pyelogram interpretation. 2) Left diagnostic uteroscopy. 3) Insertion of left uteral stent; 5 x 24 Polaris with tether. Findings: 1) Unremarkable bladder. 2) Unremarkable left retrograde pyelogram. 3) No evidence of intraluminal urothelial neoplasm whatsoever with insepction of the left kidney and ureter times several. 4) Successful placement of left ureteral stent, proximal in renal pelvis and distal in urinary bladder.  ASSESSMENT & PLAN:  Mary [name] is a 83 y.o. female   1. Adenocarcinoma of left colon, sigmoid colon, TxNxM1 with liver and lung mets, stage IV, KRAS/NRS wild type, MSI-stable  -I previously discussed the imaging, colonoscopy and biopsy results with the patient and her family in detail.  -She has seen Drs. Manny and Gross, surgical resections were discussed.  -PET scan on 11/06/16 showed hypermetabolic pulmonary and hepatic lesions consistent with metastatic colon cancer with the primary located in the sigmoid colon. I reviewed the images with pt and her family members in person  -US biopsy on 11/11/16 of the left lobe of the liver revealed metastatic adenocarcinoma. The features are consistent with a primary gastrointestinal adenocarcinoma. I discussed with pt  -We previously discussed that her metastatic cancer is incurable at this stage due to the multiorgan metastasis, and her advanced age. The goal of therapy is palliative. -We  discussed the indication for surgery, including significant bleeding, obstruction, or perforation. She does have mild constipation and intermittent abdominal cramps, but no significant bowel obstruction. Patient understands the surgery is palliative. She prefers to avoid surgery at this point.  -We reviewed [facility] test results, which showed MSI- stable, no KRAS/NRAS or BRAF mutations. Based on this, she is not a candidate for immunotherapy, but she will likely benefit from EGFR inhibitor, giving the left side colon cancer. --The [facility] results showed she is not a candidate for immunotherapy, but she is a candidate for EGFR antibody therapy. -We discussed that the prognosis of left side colon cancer without KRAS/NRAS and BRAF mutations is much better than right side colon cancer, especially with EGFR antibody treatment. -We discussed the options of systemic therapy. Due to her advanced age, she is not a candidate for intensive chemotherapy. I recommend that panitumumab, an EGFR inhibitor with low intensity chemo (5-FU infusion or Xeloda to control her cancer. -We discussed alternative management options with palliative care alone, which will be also appropriate given her advanced age and medical comorbidities. -She has recently developed worsening abdominal pain, constipation and nausea, concerning for bowel obstruction from her sigmoid colon cancer. I recommended she increase Miralax and Colace.  - I recommended low residual, low fiber diet, she is tolerating well, her nausea and abdominal discomfort has improved -I reviewed her CT abdomen and pelvis from last week, which showed disease progression in liver and lung, the primary sigmoid colon mass is stable, no imaging evidence of bowel obstruction. -I discussed the option of IV antibody Panitumumab every [redacted] weeks along with oral chemo Xeloda for 1 week on and 1 week off to help control  her cancer related symptoms. I discussed the side  effects of of both in great detail.  -Patient previously declined chemotherapy. We had a long conversation again today, she finally agreed to try to see if she can tolerate. She will think about port. --Chemotherapy consent: Side effects including but does not not limited to, fatigue, nausea, vomiting, diarrhea, hair loss, neuropathy, fluid retention, renal and kidney dysfunction, neutropenic fever, needed for blood transfusion, bleeding, cardiomyopathy, skin rash, were discussed with patient in great detail. She agrees to proceed. -We will do a CT Chest for baseline of lung metastasis. Will repeat CT CAP scan every 2-3 months.  -Labs from 9/27 reviewed and her magnesium, CBCs and CMPs are overall within normal limits.  -Will f/u with scan and chemotherapy on the 10/26 -Flu shot was offered and she agreed to flu shot today, will hold shingles vaccination due to pending chemotherapy.    2. Left renal mass and left hydronephrosis -Possible renal cell carcinoma, metastatic lesion from colon cancer is also a possibility. -She has been seen by [facility], Dr. Tresa Moore, and had left ureter stent placement on 10/10/2016.   3. Complex cyst of left ovary -will monitor. Likely benign -If she undergo abdominal surgery, may remove it -She declines surgery for now  4. Atrial Fibrillation -The patient has a history of atrial fibrillation. -Her cardiologist wants her to undergo cardio inversion and anticoagulation. -I previously advised the patient that even if she undergoes cardio inversion, her Afib might return. Also, her anticoagulation medication might make her bleeding worse. -The patient should continue taking baby aspirin.  5. HTN -I previously advised the patient to continue medications and follow up with primary care physician  6. Nutrition -We previously discussed palliative chemotherapy will make her fatigued, nauseous, and loss of appetite. -I previously stressed the importance of a healthy diet with  plenty of protein. -I referred to Nutrition.   7. Left flank and abdominal pain  -The patient is taking Aleve for pain. -I prescribed Tylenol #3 on 11/13/16. -The patient discontinued Tylenol #3 due to chest discomfort and headaches. -she may try tramadol, she is able to tolerate tramadol   8. Anemia  -The patient's Hb is 11.8 on 4.4 and has decreased from 12.3 on 10/31/16 and 13.9 on 10/10/16. This is blood loss from her tumor. -I advised the patient to take an baby dose iron pills and eat iron rich foods. -She is to drink plenty of fluids to prevent constipation. -Hg 11.9 on 05/08/17, currently resolved.   9. Goal of care discussion  -We again discussed the incurable nature of her cancer, and the overall poor prognosis, especially if she does not have good response to chemotherapy or progress on chemo -The patient understands the goal of care is palliative. -I previously recommended DNR/DNI, she will think about it   10. LE swelling -She is worsening with swelling in lower left leg and both feet  -she wear compression socks    Plan -Flu shot today -she will start Xeloda and panitumuamb on 10/26 -Ordered Xeloda today, will not start until next visit, 2070m q12h for 1 week on and 1 week off -chemo class  -Lab, f/u and panitumumab on 10/26 and 11/9  -CT chest wo contrast on 10/26 (before f/u)    All questions were answered. The patient knows to call the clinic with any problems, questions or concerns. I spent a total of 45 minutes for her visit, more than 50% face-to-face counseling.   FTruitt Merle MD 05/20/2017  This document serves as a record of services personally performed by Truitt Merle, MD. It was created on her behalf by Joslyn Devon, a trained medical scribe. The creation of this record is based on the scribe's personal observations and the provider's statements to them. This document has been checked and approved by the attending provider.

## 2017-05-20 ENCOUNTER — Ambulatory Visit: Payer: Medicare Other

## 2017-05-20 ENCOUNTER — Telehealth: Payer: Self-pay | Admitting: Hematology

## 2017-05-20 ENCOUNTER — Ambulatory Visit (HOSPITAL_BASED_OUTPATIENT_CLINIC_OR_DEPARTMENT_OTHER): Payer: Medicare Other | Admitting: Hematology

## 2017-05-20 ENCOUNTER — Encounter: Payer: Self-pay | Admitting: Hematology

## 2017-05-20 VITALS — BP 134/59 | HR 75 | Temp 98.6°F | Resp 20 | Ht 66.0 in | Wt 179.0 lb

## 2017-05-20 DIAGNOSIS — C787 Secondary malignant neoplasm of liver and intrahepatic bile duct: Secondary | ICD-10-CM | POA: Diagnosis not present

## 2017-05-20 DIAGNOSIS — R5383 Other fatigue: Secondary | ICD-10-CM

## 2017-05-20 DIAGNOSIS — I10 Essential (primary) hypertension: Secondary | ICD-10-CM | POA: Diagnosis not present

## 2017-05-20 DIAGNOSIS — C187 Malignant neoplasm of sigmoid colon: Secondary | ICD-10-CM

## 2017-05-20 DIAGNOSIS — Z23 Encounter for immunization: Secondary | ICD-10-CM

## 2017-05-20 DIAGNOSIS — R109 Unspecified abdominal pain: Secondary | ICD-10-CM

## 2017-05-20 DIAGNOSIS — C7800 Secondary malignant neoplasm of unspecified lung: Secondary | ICD-10-CM

## 2017-05-20 DIAGNOSIS — C186 Malignant neoplasm of descending colon: Secondary | ICD-10-CM

## 2017-05-20 MED ORDER — CAPECITABINE 500 MG PO TABS: 1000.0000 mg/m2 | ORAL_TABLET | Freq: Two times a day (BID) | ORAL | 3 refills | Status: DC

## 2017-05-20 MED ORDER — INFLUENZA VAC SPLIT QUAD 0.5 ML IM SUSY
0.5000 mL | PREFILLED_SYRINGE | Freq: Once | INTRAMUSCULAR | Status: AC
  Administered 2017-05-20: 0.5 mL via INTRAMUSCULAR
  Filled 2017-05-20: qty 0.5

## 2017-05-20 NOTE — Progress Notes (Signed)
Nutrition Assessment   Reason for Assessment:   Questions regarding low fiber diet  ASSESSMENT:  83 year old female with metastatic sigmoid colon adenocarcinoma and left renal mass.  Noted patient planning to start panitumumab and xeloda.    Met with patient and daughter-in-law this am following MD appointment.  Patient wanting more information on low fiber diet.   Patient reports appetite is good overall, eating 3 meals per day and supplementing diet with carnation instant breakfast (family member use to work for [employer]).  Does not like boost.    Nutrition Focused Physical Exam: deferred  Medications: MVI, zofran, prilosec  Labs: reviewed  Anthropometrics:   Weight 179 lb today stable weight noted   Estimated Energy Needs  Kcals: 1800-2000 calories/d Protein: 89-118 g/d Fluid: 2 L/d  NUTRITION DIAGNOSIS: Food and nutrition related knowledge deficit related to cancer and possible bowel obstruction as evidenced by wanting information on low fiber diet.   MALNUTRITION DIAGNOSIS: none at this time   INTERVENTION:   Discussed low fiber diet guidelines with patient and daughter-in-law.  Fact sheet given and questions answered. Encouraged good sources of protein, small frequent meals. Encouraged continued supplementation of carnation instant breakfast    MONITORING, EVALUATION, GOAL: Patient will consume adequate calories and protein to maintain weight   NEXT VISIT: as needed, contact information provided  Calisa Luckenbaugh B. Zenia Resides, [place], [place] Registered Dietitian [phone] (pager)

## 2017-05-20 NOTE — Telephone Encounter (Signed)
Scheduled appt per 10/9 los - Gave patient AVS and calender per los.  

## 2017-05-27 ENCOUNTER — Telehealth: Payer: Self-pay | Admitting: Pharmacist

## 2017-05-27 DIAGNOSIS — C186 Malignant neoplasm of descending colon: Secondary | ICD-10-CM

## 2017-05-27 MED ORDER — CAPECITABINE 500 MG PO TABS: 1000.0000 mg/m2 | ORAL_TABLET | Freq: Two times a day (BID) | ORAL | 3 refills | Status: DC

## 2017-05-27 MED FILL — CAPECITABINE 500 MG TABLET: 500 | 28 days supply | Qty: 112 | Fill #0

## 2017-05-27 NOTE — Telephone Encounter (Signed)
Oral Oncology Pharmacist Encounter  Received new prescription for Xeloda for the treatment of metastatic colon cancer, Kras-WT in conjunction with infusional Vectibix, planned duration until disease progression or unacceptable toxicity.  Labs from 05/08/17 assessed, OK for treatment.   Noted patient with atrial fibrillation, Xeloda has <5% incidence of AFib and bradycardia 01/08/17 EKG shows QTc 408 msec 10/31/16 EKG shows QTc 407 msec Cardizem started in March 2018, dose increased in July 2018  Current medication list in Epic reviewed, DDIs with Xeloda identified:  Xeloda and Lexapro: category C interaction due to possibility of QTC prolongation. QTc as above. Electrolytes will be closely monitored with infusuional Vectibix. Recommend repeat EKG if electrolyte abnormalities are present. Patient noted to have cardiology follow-up 07/08/17. No change to current therapy is indicated at this time.  Prescription has been e-scribed to the [facility] for benefits analysis and approval. Colletta Maryland requests Xeloda be shipped to patient's home at address in [place]. As long as there are no issues, Xeloda should be mailed from pharmacy on 05/28/17 for delivery to patient's home on 05/29/17. The pharmacy will reach out to [facility] if there are any issues.  I spoke with patient's daughter-in-law, Colletta Maryland, for overview of new oral chemotherapy medication: Xeloda (capecitabine).   Pt is doing well. Counseled patient on administration, dosing, side effects, monitoring, drug-food interactions, safe handling, storage, and disposal.  Patient will take Xeloda 549m tablets, 4 tablets (2074m by mouth in AM and 4 tabs (20013mby mouth in PM, within 30 minutes of finishing meals, on days 1-7 and days 15-21 of each 28 days.  Vectibix will be infused every 14 days. Xeloda and Vectibix start date: 06/06/17  Side effects of Xeloda include but not limited to: fatigue, decreased blood counts, GI  upset, diarrhea, and hand-foot syndrome. Patient has loperamide at home as she already experiences diarrhea and will call the office if diarrhea worsens.    Reviewed with patient importance of keeping a medication schedule and plan for any missed doses.  SteColletta Marylandiced understanding and appreciation.   All questions answered. Medication reconciliation performed and medication/allergy list updated. We discussed monitoring of electrolytes due to atrial fibrillation.  SteColletta Marylandows to call the office with questions or concerns. Oral Oncology Clinic will continue to follow.  Thank you,  JesJohny DrillingharmD, BCPS, BCOP 05/27/2017   2:44 PM Oral Oncology Clinic [phone]

## 2017-05-28 ENCOUNTER — Other Ambulatory Visit: Payer: Self-pay | Admitting: Cardiovascular Disease

## 2017-05-28 MED ORDER — DILTIAZEM HCL ER COATED BEADS 240 MG PO CP24: 240.0000 mg | ORAL_CAPSULE | Freq: Every day | ORAL | 1 refills | Status: DC

## 2017-05-28 NOTE — Telephone Encounter (Signed)
New message     *STAT* If patient is at the pharmacy, call can be transferred to refill team.   1. Which medications need to be refilled? (please list name of each medication and dose if known) diltiazem (CARDIZEM CD) 240 MG 24 hr capsule  2. Which pharmacy/location (including street and city if local pharmacy) is medication to be sent to? [facility]  [address] [place]  3. Do they need a 30 day or 90 day supply? [place]

## 2017-05-28 NOTE — Telephone Encounter (Signed)
Rx(s) sent to pharmacy electronically.  

## 2017-06-05 NOTE — Progress Notes (Signed)
[place]  Telephone:([phone]) [phone] [phone]  Clinic Follow Up Note   Patient Care Team: Moshe Cipro, MD as PCP - General (Internal Medicine) Alexis Frock, MD as Consulting Physician (Urology) Michael Boston, MD as Consulting Physician (General Surgery) Toma Deiters, MD as Referring Physician (Gastroenterology) 06/06/2017  CHIEF COMPLAINTS:  Metastatic sigmoid colon Adenocarcinoma  Oncology History   Cancer Staging Cancer of left colon [facility]) Staging form: Colon and Rectum, AJCC 8th Edition - Clinical stage from 09/04/2016: Stage IVB (cTX, cNX, pM1b) - Signed by Truitt Merle, MD on 11/12/2016       Cancer of left colon ([place])   09/02/2016 Procedure    Colonoscopy Diverticulosis (scattered). Pt has about 10 cm structure starting at 20 cm with abnormal mucosa. Biopsies taken.       09/02/2016 Pathology Results    MODERATELY DIFFERENTIATED ADENOCARCINOMA OF COLON AT 20 CM      09/04/2016 Initial Diagnosis    Cancer of left colon ([place])      09/06/2016 Imaging    CT CAP w Contrast 1. No acute abdominopelvic process.  2. Heterogenous hypodense area involving the medial edge of the left kidney with multiple small soft tissue like stranding involving the left pararenal space. Findings are suspicious for malignancy of the left kidney with small pararenal metastases. MRI abdomen with contrast should be considered to further evaluate.  3. Vague hypodensity involving the lateral segment of the liver. This can also be reevaluated with MRI.  4. Partially visualized 5 mm posterior right lower lobe nodular density. CT chest without contrast follow up is recommended.  5. Complex multilobular left ovarian lesion may reflect a cluster of cysts.      10/10/2016 Procedure    Operative Report by Dr. Tresa Moore on 10/18/16 Procedure: 1) Cystoscopy, left retrograde pyelogram interpretation. 2) Left diagnostic uteroscopy. 3) Insertion of left uteral stent; 5 x 24 Polaris  with tether. Findings: 1) Unremarkable bladder. 2) Unremarkable left retrograde pyelogram. 3) No evidence of intraluminal urothelial neoplasm whatsoever with insepction of the left kidney and ureter times several. 4) Successful placement of left ureteral stent, proximal in renal pelvis and distal in urinary bladder.      10/15/2016 Imaging    MRI abdomen pelvis 1. There are 2 enhancing hepatic lesions highly worrisome for metastatic disease, likely metastatic colon cancer. 2. The lesion of concern in the anterior suprahilar lip of the left kidney is also enhancing, worrisome for neoplasm. This has a somewhat atypical appearance for renal cell carcinoma and could reflect a metastasis as well. 3. New left-sided hydroureter with delayed contrast excretion consistent with distal ureteral obstruction. Specific etiology uncertain. 4. Complex cystic and solid left adnexal lesion could reflect cystic neoplasm, although does not appear aggressive or appear to reflect the cause of the distal left ureteral obstruction. 5. Sigmoid colon wall thickening could be related to reported newly diagnosed colon cancer.      11/06/2016 PET scan    PET 11/06/2016 IMPRESSION: 1. Hypermetabolic pulmonary and hepatic lesions, most consistent with metastatic colon cancer, with the hypermetabolic primary located in the sigmoid colon. 2. Left renal and left adnexal lesions, better seen and evaluated on 10/15/2016. 3. Persistent moderate left hydronephrosis. 4. Aortic atherosclerosis (ICD10-170.0).      11/11/2016 Pathology Results    Liver, needle/core biopsy, left lobe - METASTATIC ADENOCARCINOMA. The histologic features are consistent with a primary gastrointestinal adenocarcinoma.      05/19/2017 Imaging    CT Abdomen Pelvis, 05/19/2017 IMPRESSION: 1. Worsening pulmonary  and hepatic metastatic disease. Primary sigmoid lesion is stable to minimally enlarged. 2. Stable to slight enlargement of a left  perinephric mass, likely a metastasis. Renal cell carcinoma is not excluded. Associated pelvocaliectasis versus mild hydronephrosis. 3. Borderline left periaortic lymph nodes, slightly enlarged. 4. Cholelithiasis. 5.  Aortic atherosclerosis (ICD10-170.0). 6. Cystic and solid lesion in the left adnexa, as on prior exams, likely ovarian in origin.      06/06/2017 -  Chemotherapy    PENDING IV antibody Vectibix every [redacted] weeks along with oral chemo Xeloda for 1 week on and 1 week off starting 06/06/17       HISTORY OF PRESENTING ILLNESS (10/25/2016):  Mary [name] 83 y.o. female is here because of adenocarcinoma of the colon and a left renal mass. The pt noticed worsening episodes of abdominal cramping and nausea with bowel movements in July 2017. She had an abdominal US performed, which was unremarkable. A colonoscopy was then performed at the patient's request, which found a mass about 20 cm from the anal verge. Biopsy showed moderately differentiated adenocarcinoma. MRI done, with a lesion noticed in the left kidney near the pelvis, a 11 mm hepatic lesion, 4 mm lung nodule, and possible cystic left ovarian mass. Pt saw Dr. Johney [place] for information on abdominal surgery for her colon, which she declined. She also saw Dr. Lorna Dibble, who is considering a nephrectomy, but is planning a sternoscopy to better characterize the kidney lesion. She presents today for continued treatment.    She presents today with her son and daughter-in-law. The symptoms started in late July 2017. She had some vomiting, abdominal pain, and constipation, but not bleeding. Her PCP never found any anemia. Initially she lost her appetite, but now she is taking multiple vitamins and supplements, so her appetite is back. Her bowel has improved, but she still has abdominal pain that comes and goes, lasting minutes. The pain is tolerable with aleve. She did not tolerate Tramadol well; nausea and vomiting. She has some occasional left  sides back pain. Denies weight loss, or any other concerns. She is able to do everything she needs to around the house.   She had a partial hysterectomy, and a lumpectomy. She has a history of HTN, atrial fibrillation, high cholesterol. She has some hearing loss and can not see out of her right eye. Not a diabetic, but her son and her mother are. No family history of colon cancer. Her sister had breast cancer in her 42's and her brother had lung cancer in his 70's. Never smoker. Doesn't drink alcohol. 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 She has two sons; one lives an hour away, and the other lives in [place]. She lives alone.   CURRENT THERAPY: IV antibody Vectibix every [redacted] weeks along with oral chemo Xeloda for 1 week on and 1 week off starting 06/06/17  INTERVAL HISTORY:   TAYLAR HARTSOUGH is here for a follow up and first cycle Panitumumab and Xeloda. She presents in the clinic today accompanied her daughter-in-law.  She notes switching to low fiber helps her. She has received Xeloda and has not started it. She is no longer taking fosamax. But she is taking her other medication. She notes receiving her first bill from radiology but it has not been processed yet. Her daughter-in-law wonder if copays for prescription medication can be sent through mail order.    MEDICAL HISTORY:  Past Medical History:  Diagnosis Date  . Aneurysm of ascending aorta (HCC) 12/03/2016   3.7  cm by echo 10/2016  . Colon cancer [facility])    dx via biospy from colonoscopy-- malignant ---  currently having work-up done w/ dr gross (general surgeon) and coordinate surgery w/ urologsit for renal tumor  . Dysrhythmia    A  Fib  . Essential hypertension 10/31/2016  . GERD (gastroesophageal reflux disease)   . Glaucoma   . Hiatal hernia   . HOH (hard of hearing)    bilateral even w/ hearing aids  . Hyperlipidemia   . Hypertension   . Kidney tumor    left side  . Legally blind in right eye, as defined in [place]    due to  macular degeneration  . Macular degeneration   . Nausea    intermittant due to colon mass  . Ovarian cyst   . Persistent atrial fibrillation ([place]) 10/31/2016  . PONV (postoperative nausea and vomiting)   . Wears glasses   . Wears hearing aid    bilateral    SURGICAL HISTORY: Past Surgical History:  Procedure Laterality Date  . BREAST CYST EXCISION  1990   benign  . COLONOSCOPY  09/04/2016  . CYSTOSCOPY WITH RETROGRADE PYELOGRAM, URETEROSCOPY AND STENT PLACEMENT Left 10/10/2016   Procedure: CYSTOSCOPY WITH RETROGRADE PYELOGRAM, DIAGNOSTIC URETEROSCOPY  AND STENT PLACEMENT;  Surgeon: Alexis Frock, MD;  Location: [facility];  Service: Urology;  Laterality: Left;  . ESOPHAGOGASTRODUODENOSCOPY  11/07/2014  . NASAL SINUS SURGERY    . TONSILLECTOMY      SOCIAL HISTORY: Social History   Social History  . Marital status: Widowed    Spouse name: N/A  . Number of children: N/A  . Years of education: N/A   Occupational History  . Not on file.   Social History Main Topics  . Smoking status: Never Smoker  . Smokeless tobacco: Never Used  . Alcohol use No  . Drug use: No  . Sexual activity: Not on file   Other Topics Concern  . Not on file   Social History Narrative  . No narrative on file    FAMILY HISTORY: Family History  Problem Relation Age of Onset  . Cancer Sister 33       breast cancer   . Cancer Brother 63       lung cancer   . Diabetes Son   . High blood pressure Son   . Peripheral Artery Disease Mother   . High blood pressure Mother     ALLERGIES:  is allergic to benazepril and bactrim [sulfamethoxazole-trimethoprim].  MEDICATIONS:  Current Outpatient Prescriptions  Medication Sig Dispense Refill  . alendronate (FOSAMAX) 70 MG tablet Take 70 mg by mouth once a week. Take with a full glass of water on an empty stomach.    Marland Kitchen amLODipine (NORVASC) 5 MG tablet Take 1 tablet (5 mg total) by mouth daily. (Patient not taking: Reported on  05/20/2017) 90 tablet 1  . capecitabine (XELODA) 500 MG tablet Take 4 tablets (2,000 mg total) by mouth 2 (two) times daily after a meal. Take on days 1-7 and days 15-21, every 28 days 112 tablet 3  . diltiazem (CARDIZEM CD) 240 MG 24 hr capsule Take 1 capsule (240 mg total) by mouth daily. 90 capsule 1  . escitalopram (LEXAPRO) 10 MG tablet Take 10 mg by mouth daily.    . fexofenadine (ALLEGRA) 180 MG tablet Take 180 mg by mouth daily.    . furosemide (LASIX) 40 MG tablet Take 1 tablet (40 mg total) by mouth daily. [place]  tablet 1  . latanoprost (XALATAN) 0.005 % ophthalmic solution Place 1 drop into both eyes at bedtime.    Marland Kitchen losartan (COZAAR) 100 MG tablet Take 1 tablet (100 mg total) by mouth daily. 90 tablet 1  . Multiple Vitamins-Minerals (VITEYES AREDS ADVANCED) CAPS Take 1 capsule by mouth daily.    Marland Kitchen omeprazole (PRILOSEC) 40 MG capsule Take 40 mg by mouth daily.    . ondansetron (ZOFRAN ODT) 4 MG disintegrating tablet Take 1 tablet (4 mg total) by mouth every 8 (eight) hours as needed for nausea or vomiting. (Patient not taking: Reported on 05/20/2017) 20 tablet 0  . ondansetron (ZOFRAN) 8 MG tablet Take 1 tablet (8 mg total) by mouth every 8 (eight) hours as needed for nausea. 30 tablet 2  . Polyethyl Glycol-Propyl Glycol (SYSTANE) 0.4-0.3 % SOLN Apply to eye as needed.    . traMADol (ULTRAM) 50 MG tablet Take 1 tablet (50 mg total) by mouth 3 (three) times daily as needed. For increased pain. 270 tablet 0   Current Facility-Administered Medications  Medication Dose Route Frequency Provider Last Rate Last Dose  . 0.9 %  sodium chloride infusion  250 mL Intravenous Continuous Skeet Latch, MD      . sodium chloride flush (NS) 0.9 % injection 3 mL  3 mL Intravenous Q12H Skeet Latch, MD      . sodium chloride flush (NS) 0.9 % injection 3 mL  3 mL Intravenous PRN Skeet Latch, MD        REVIEW OF SYSTEMS:   Constitutional: Denies fevers, chills or abnormal night sweats  Eyes:  Denies blurriness of vision, double vision or watery eyes Ears, nose, mouth, throat, and face: Denies mucositis or sore throat Respiratory: Denies cough, dyspnea or wheezes Cardiovascular: Denies palpitation, chest discomfort  Gastrointestinal:  Denies heartburn  Skin: Denies abnormal skin rashes Lymphatics: Denies new lymphadenopathy or easy bruising MSK: negative Neurological:Denies numbness, tingling or new weaknesses Behavioral/Psych: Mood is stable, no new changes  All other systems were reviewed with the patient and are negative.  PHYSICAL EXAMINATION:  ECOG PERFORMANCE STATUS: 1 - Symptomatic but completely ambulatory  Vitals:   06/06/17 0851  BP: 138/64  Pulse: 83  Resp: 20  Temp: 98.4 F (36.9 C)  SpO2: 97%   Filed Weights   06/06/17 0851  Weight: 178 lb 4.8 oz (80.9 kg)      GENERAL:alert, no distress and comfortable SKIN: skin color, texture, turgor are normal, no rashes or significant lesions EYES: normal, conjunctiva are pink and non-injected, sclera clear OROPHARYNX:no exudate, no erythema and lips, buccal mucosa, and tongue normal  NECK: supple, thyroid normal size, non-tender, without nodularity LYMPH:  no palpable lymphadenopathy in the cervical, axillary or inguinal LUNGS: clear to auscultation and percussion with normal breathing effort HEART: regular rate & rhythm and no murmurs and (+)symmetrical lower extremity edema  ABDOMEN:abdomen soft, non-tender and normal bowel sounds Musculoskeletal:no cyanosis of digits and no clubbing  PSYCH: alert & oriented x 3 with fluent speech NEURO: no focal motor/sensory deficits   LABORATORY DATA:  I have reviewed the data as listed CBC Latest Ref Rng & Units 06/06/2017 05/08/2017 02/26/2017  WBC 3.9 - 10.3 10e3/uL 10.1 9.9 12.5(H)  Hemoglobin 11.6 - 15.9 g/dL 12.2 11.9 12.0  Hematocrit 34.8 - 46.6 % 36.7 36.8 37.3  Platelets 145 - 400 10e3/uL 332 310 360   CMP Latest Ref Rng & Units 06/06/2017 05/08/2017  02/26/2017  Glucose 70 - 140 mg/dl 90 96 81  BUN 7.0 -  26.0 mg/dL 15.8 20.9 19  Creatinine 0.6 - 1.1 mg/dL 0.9 1.0 0.88  Sodium 136 - 145 mEq/L 139 139 140  Potassium 3.5 - 5.1 mEq/L 4.2 4.7 4.9  Chloride 96 - 106 mmol/L - - 99  CO2 22 - 29 mEq/L 26 27 25   Calcium 8.4 - 10.4 mg/dL 9.7 9.6 10.0  Total Protein 6.4 - 8.3 g/dL 7.8 7.9 -  Total Bilirubin 0.20 - 1.20 mg/dL 0.44 0.59 -  Alkaline Phos 40 - 150 U/L 103 102 -  AST 5 - 34 U/L 17 18 -  ALT 0 - 55 U/L 11 12 -   PATHOLOGY: [facility] 11/11/16   Diagnosis 11/11/16 Liver, needle/core biopsy, left lobe - METASTATIC ADENOCARCINOMA. - SEE COMMENT. Microscopic Comment The histologic features are consistent with a primary gastrointestinal adenocarcinoma. Dr. Vicente Males has reviewed the case and concurs with this interpretation. There is likely sufficient tumor present for additional studies, if requested. Enid Cutter MD Pathologist, Electronic Signature (Case signed 11/12/2016)  Diagnosis 09/04/2016 MODERATELY DIFFERENTIATED ADENOCARCINOMA OF COLON AT 20 CM Tumor Site: Colon at 20 cm Tumor Type: Adenocarcinoma Tumore Size: Unknown  Grade: Moderately differentiated   RADIOGRAPHIC STUDIES: I have personally reviewed the radiological images as listed and agreed with the findings in the report.  CT Chest 06/06/17  IMPRESSION: 1. There are multiple new and enlarging pulmonary nodules throughout the lungs bilaterally. Additionally, a few of the nodules have decreased in size when compared to prior. 2. Interval increase in size of precarinal lymph node. 3. Interval increase in size of hepatic lesions. 4. Aortic Atherosclerosis (ICD10-I70.0).  CT Abdomen Pelvis, 05/19/2017 IMPRESSION: 1. Worsening pulmonary and hepatic metastatic disease. Primary sigmoid lesion is stable to minimally enlarged. 2. Stable to slight enlargement of a left perinephric mass, likely a metastasis. Renal cell carcinoma is not excluded.  Associated pelvocaliectasis versus mild hydronephrosis. 3. Borderline left periaortic lymph nodes, slightly enlarged. 4. Cholelithiasis. 5.  Aortic atherosclerosis (ICD10-170.0). 6. Cystic and solid lesion in the left adnexa, as on prior exams, likely ovarian in origin.  PET 11/06/2016 IMPRESSION: 1. Hypermetabolic pulmonary and hepatic lesions, most consistent with metastatic colon cancer, with the hypermetabolic primary located in the sigmoid colon. 2. Left renal and left adnexal lesions, better seen and evaluated on 10/15/2016. 3. Persistent moderate left hydronephrosis. 4. Aortic atherosclerosis (ICD10-170.0).  MRI Abdomen Pelvis w/wo Contrast 10/15/2016 IMPRESSION: 1. There are 2 enhancing hepatic lesions highly worrisome for metastatic disease, likely metastatic colon cancer. 2. The lesion of concern in the anterior suprahilar lip of the left kidney is also enhancing, worrisome for neoplasm. This has a somewhat atypical appearance for renal cell carcinoma and could reflect a metastasis as well. 3. New left-sided hydroureter with delayed contrast excretion consistent with distal ureteral obstruction. Specific etiology uncertain. 4. Complex cystic and solid left adnexal lesion could reflect cystic neoplasm, although does not appear aggressive or appear to reflect the cause of the distal left ureteral obstruction. 5. Sigmoid colon wall thickening could be related to reported newly diagnosed colon cancer.  CT CAP w Contrast 09/06/2016 (done outside) IMPRESSION: 1. No acute abdominopelvic process.  2. Heterogenous hypodense area involving the medial edge of the left kidney with multiple small soft tissue like stranding involving the left pararenal space. Findings are suspicious for malignancy of the left kidney with small pararenal metastases. MRI abdomen with contrast should be considered to further evaluate.  3. Vague hypodensity involving the lateral segment of the liver. This  can also be reevaluated with MRI.  4. Partially visualized 5 mm posterior right lower lobe nodular density. CT chest without contrast follow up is recommended.  5. Complex multilobular left ovarian lesion may reflect a cluster of cysts.     PROCEDURES Echocardiogram 11/03/16 - Left ventricle: The cavity size was normal. Wall thickness was   increased in a pattern of mild LVH. Indeterminant diastolic   function (atrial fibrillation). Systolic function was normal. The   estimated ejection fraction was in the range of 60% to 65%.   Operative Report by Dr. Tresa Moore on 10/10/16 Procedure: 1) Cystoscopy, left retrograde pyelogram interpretation. 2) Left diagnostic uteroscopy. 3) Insertion of left uteral stent; 5 x 24 Polaris with tether. Findings: 1) Unremarkable bladder. 2) Unremarkable left retrograde pyelogram. 3) No evidence of intraluminal urothelial neoplasm whatsoever with insepction of the left kidney and ureter times several. 4) Successful placement of left ureteral stent, proximal in renal pelvis and distal in urinary bladder.  Colonoscopy 09/02/2016 FINDINGS: Diverticulosis (scattered). Pt has about 10 cm structure starting at 20 cm with abnormal mucosa. The colonoscopy scope was not able to advance due to structure, and upper endoscopy scope was used and it was advanced through the stricture to see ileocecal valve. Biopsies at the stricture were taken.   ASSESSMENT & PLAN:  Amanada is a 83 y.o. female   1. Adenocarcinoma of left colon, sigmoid colon, TxNxM1 with liver and lung mets, stage IV, KRAS/NRS wild type, MSI-stable  -I previously discussed the imaging, colonoscopy and biopsy results with the patient and her family in detail.  -She has seen Drs. Manny and Gross, surgical resections were discussed.  -PET scan on 11/06/16 showed hypermetabolic pulmonary and hepatic lesions consistent with metastatic colon cancer with the primary located in the sigmoid colon. I reviewed the images  with pt and her family members in person  -US biopsy on 11/11/16 of the left lobe of the liver revealed metastatic adenocarcinoma. The features are consistent with a primary gastrointestinal adenocarcinoma. I discussed with pt  -We previously discussed that her metastatic cancer is incurable at this stage due to the multiorgan metastasis, and her advanced age. The goal of therapy is palliative. -We discussed the indication for surgery, including significant bleeding, obstruction, or perforation. She does have mild constipation and intermittent abdominal cramps, but no significant bowel obstruction. Patient understands the surgery is palliative. She prefers to avoid surgery at this point.  -We reviewed [facility] test results, which showed MSI- stable, no KRAS/NRAS or BRAF mutations. Based on this, she is not a candidate for immunotherapy, but she will likely benefit from EGFR inhibitor, giving the left side colon cancer. --The [facility] results showed she is not a candidate for immunotherapy, but she is a candidate for EGFR antibody therapy. -We discussed that the prognosis of left side colon cancer without KRAS/NRAS and BRAF mutations is much better than right side colon cancer, especially with EGFR antibody treatment. -We discussed the options of systemic therapy. Due to her advanced age, she is not a candidate for intensive chemotherapy. I recommend that panitumumab, an EGFR inhibitor with low intensity chemo (5-FU infusion or Xeloda to control her cancer. -We discussed alternative management options with palliative care alone, which will be also appropriate given her advanced age and medical comorbidities. -She has recently developed worsening abdominal pain, constipation and nausea, concerning for bowel obstruction from her sigmoid colon cancer. I recommended she increase Miralax and Colace.  - I recommended low residual, low fiber diet, she is tolerating well, her nausea and abdominal  discomfort has improved -I reviewed her CT abdomen and pelvis from 05/19/17, which showed disease progression in liver and lung, the primary sigmoid colon mass is stable, no imaging evidence of bowel obstruction. -I discussed the option of IV antibody Vectibix every [redacted] weeks along with oral chemo Xeloda for 1 week on and 1 week off to help control her cancer related symptoms. I discussed the side effects of of both in great detail. She finally agreed to proceed  -I reviewed her restaging CT chest from this morning, which showed disease progression in her lungs.  -Will repeat CT CAP scan every 2-3 months.  -She starts Vectibix and Xeloda on 06/06/17.  -Labs reviewed and adequate to proceed with first cycle chemotherapy.  -I encouraged her to contact us if she experiences any significant or unexpected side effects from chemotherapy.  -F/u on 11/28   2. Left renal mass and left hydronephrosis -Possible renal cell carcinoma, metastatic lesion from colon cancer is also a possibility. -She has been seen by [facility], Dr. Tresa Moore, and had left ureter stent placement on 10/10/2016.   3. Complex cyst of left ovary -will monitor. Likely benign -If she undergo abdominal surgery, may remove it -She declines surgery for now  4. Atrial Fibrillation -The patient has a history of atrial fibrillation. -Her cardiologist wants her to undergo cardio inversion and anticoagulation. -I previously advised the patient that even if she undergoes cardio inversion, her Afib might return. Also, her anticoagulation medication might make her bleeding worse. -The patient should continue taking baby aspirin.  5. HTN -I previously advised the patient to continue medications and follow up with primary care physician  6. Nutrition -We previously discussed palliative chemotherapy will make her fatigued, nauseous, and loss of appetite. -I previously stressed the importance of a healthy diet with plenty of protein. -I referred to  Nutrition.   7. Left flank and abdominal pain  -The patient is taking Aleve for pain. -I prescribed Tylenol #3 on 11/13/16. -The patient discontinued Tylenol #3 due to chest discomfort and headaches. -she may try tramadol, she is able to tolerate tramadol   8. Anemia  -The patient's Hb is 11.8 on 4.4 and has decreased from 12.3 on 10/31/16 and 13.9 on 10/10/16. This is blood loss from her tumor. -I advised the patient to take an baby dose iron pills and eat iron rich foods. -She is to drink plenty of fluids to prevent constipation. -Hg 11.9 on 05/08/17 -currently resolved.   9. Goal of care discussion  -We again discussed the incurable nature of her cancer, and the overall poor prognosis, especially if she does not have good response to chemotherapy or progress on chemo -The patient understands the goal of care is palliative. -I previously recommended DNR/DNI, she will think about it   10. LE swelling -She is worsening with swelling in lower left leg and both feet  -she wear compression socks    Plan -CT scan and lab reviewed, adequate for treatment, she will proceed for cycle Vectibix and Xeloda today  -return in 2 weeks for f/u and treatment    All questions were answered. The patient knows to call the clinic with any problems, questions or concerns. I spent a total of 30 minutes for her visit, more than 50% face-to-face counseling.   Joslyn Devon 06/06/2017   This document serves as a record of services personally performed by Truitt Merle, MD. It was created on her behalf by Joslyn Devon, a trained medical scribe. The creation of  this record is based on the scribe's personal observations and the provider's statements to them. This document has been checked and approved by the attending provider.

## 2017-06-06 ENCOUNTER — Ambulatory Visit (HOSPITAL_BASED_OUTPATIENT_CLINIC_OR_DEPARTMENT_OTHER): Payer: Medicare Other

## 2017-06-06 ENCOUNTER — Telehealth: Payer: Self-pay | Admitting: Hematology

## 2017-06-06 ENCOUNTER — Ambulatory Visit (HOSPITAL_BASED_OUTPATIENT_CLINIC_OR_DEPARTMENT_OTHER): Payer: Medicare Other | Admitting: Hematology

## 2017-06-06 ENCOUNTER — Other Ambulatory Visit (HOSPITAL_BASED_OUTPATIENT_CLINIC_OR_DEPARTMENT_OTHER): Payer: Medicare Other

## 2017-06-06 ENCOUNTER — Ambulatory Visit (HOSPITAL_COMMUNITY)
Admission: RE | Admit: 2017-06-06 | Discharge: 2017-06-06 | Disposition: A | Discharge status: Home / Self Care | Payer: Medicare Other | Source: Ambulatory Visit | Attending: Hematology | Admitting: Hematology

## 2017-06-06 VITALS — BP 129/60 | HR 67

## 2017-06-06 VITALS — BP 138/64 | HR 83 | Temp 98.4°F | Resp 20 | Ht 66.0 in | Wt 178.3 lb

## 2017-06-06 DIAGNOSIS — C7800 Secondary malignant neoplasm of unspecified lung: Secondary | ICD-10-CM | POA: Diagnosis not present

## 2017-06-06 DIAGNOSIS — Z7189 Other specified counseling: Secondary | ICD-10-CM | POA: Diagnosis not present

## 2017-06-06 DIAGNOSIS — C787 Secondary malignant neoplasm of liver and intrahepatic bile duct: Secondary | ICD-10-CM

## 2017-06-06 DIAGNOSIS — C187 Malignant neoplasm of sigmoid colon: Secondary | ICD-10-CM

## 2017-06-06 DIAGNOSIS — I700 Atherosclerosis of aorta: Secondary | ICD-10-CM | POA: Diagnosis not present

## 2017-06-06 DIAGNOSIS — I4891 Unspecified atrial fibrillation: Secondary | ICD-10-CM

## 2017-06-06 DIAGNOSIS — C186 Malignant neoplasm of descending colon: Secondary | ICD-10-CM

## 2017-06-06 DIAGNOSIS — Z5112 Encounter for antineoplastic immunotherapy: Secondary | ICD-10-CM

## 2017-06-06 DIAGNOSIS — R918 Other nonspecific abnormal finding of lung field: Secondary | ICD-10-CM | POA: Insufficient documentation

## 2017-06-06 DIAGNOSIS — I10 Essential (primary) hypertension: Secondary | ICD-10-CM | POA: Diagnosis not present

## 2017-06-06 LAB — CBC WITH DIFFERENTIAL/PLATELET
BASO%: 0.9 % (ref 0.0–2.0)
Basophils Absolute: 0.1 10*3/uL (ref 0.0–0.1)
EOS%: 2.3 % (ref 0.0–7.0)
Eosinophils Absolute: 0.2 10*3/uL (ref 0.0–0.5)
HEMATOCRIT: 36.7 % (ref 34.8–46.6)
HEMOGLOBIN: 12.2 g/dL (ref 11.6–15.9)
LYMPH#: 1.4 10*3/uL (ref 0.9–3.3)
LYMPH%: 14.3 % (ref 14.0–49.7)
MCH: 29 pg (ref 25.1–34.0)
MCHC: 33.3 g/dL (ref 31.5–36.0)
MCV: 87.2 fL (ref 79.5–101.0)
MONO#: 1.1 10*3/uL — ABNORMAL HIGH (ref 0.1–0.9)
MONO%: 11.1 % (ref 0.0–14.0)
NEUT#: 7.2 10*3/uL — ABNORMAL HIGH (ref 1.5–6.5)
NEUT%: 71.4 % (ref 38.4–76.8)
Platelets: 332 10*3/uL (ref 145–400)
RBC: 4.21 10*6/uL (ref 3.70–5.45)
RDW: 14.7 % — ABNORMAL HIGH (ref 11.2–14.5)
WBC: 10.1 10*3/uL (ref 3.9–10.3)

## 2017-06-06 LAB — COMPREHENSIVE METABOLIC PANEL
ALBUMIN: 3.6 g/dL (ref 3.5–5.0)
ALT: 11 U/L (ref 0–55)
AST: 17 U/L (ref 5–34)
Alkaline Phosphatase: 103 U/L (ref 40–150)
Anion Gap: 9 mEq/L (ref 3–11)
BUN: 15.8 mg/dL (ref 7.0–26.0)
CALCIUM: 9.7 mg/dL (ref 8.4–10.4)
CHLORIDE: 105 meq/L (ref 98–109)
CO2: 26 mEq/L (ref 22–29)
CREATININE: 0.9 mg/dL (ref 0.6–1.1)
EGFR: >60 mL/min/{1.73_m2} (ref >60)
Glucose: 90 mg/dl (ref 70–140)
Potassium: 4.2 mEq/L (ref 3.5–5.1)
Sodium: 139 mEq/L (ref 136–145)
Total Bilirubin: 0.44 mg/dL (ref 0.20–1.20)
Total Protein: 7.8 g/dL (ref 6.4–8.3)

## 2017-06-06 LAB — MAGNESIUM: MAGNESIUM: 2.3 mg/dL (ref 1.5–2.5)

## 2017-06-06 MED ORDER — SODIUM CHLORIDE 0.9 % IV SOLN
Freq: Once | INTRAVENOUS | Status: AC
  Administered 2017-06-06: 11:00:00 via INTRAVENOUS

## 2017-06-06 MED ORDER — SODIUM CHLORIDE 0.9 % IV SOLN
6.0000 mg/kg | Freq: Once | INTRAVENOUS | Status: AC
  Administered 2017-06-06: 500 mg via INTRAVENOUS
  Filled 2017-06-06: qty 20

## 2017-06-06 NOTE — Telephone Encounter (Signed)
Gave avs and calendar fr november and december

## 2017-06-06 NOTE — Patient Instructions (Signed)
[facility] Discharge Instructions for Patients Receiving Chemotherapy  Today you received the following chemotherapy agents Vectibix  To help prevent nausea and vomiting after your treatment, we encourage you to take your nausea medication as prescribed.   If you develop nausea and vomiting that is not controlled by your nausea medication, call the clinic.   BELOW ARE SYMPTOMS THAT SHOULD BE REPORTED IMMEDIATELY:  *FEVER GREATER THAN 100.5 F  *CHILLS WITH OR WITHOUT FEVER  NAUSEA AND VOMITING THAT IS NOT CONTROLLED WITH YOUR NAUSEA MEDICATION  *UNUSUAL SHORTNESS OF BREATH  *UNUSUAL BRUISING OR BLEEDING  TENDERNESS IN MOUTH AND THROAT WITH OR WITHOUT PRESENCE OF ULCERS  *URINARY PROBLEMS  *BOWEL PROBLEMS  UNUSUAL RASH Items with * indicate a potential emergency and should be followed up as soon as possible.  Feel free to call the clinic should you have any questions or concerns. The clinic phone number is [phone].  Please show the CHEMO ALERT CARD at check-in to the Emergency Department and triage nurse.   Panitumumab Solution for Injection What is this medicine? PANITUMUMAB (pan i TOOM ue mab) is a monoclonal antibody. It is used to treat colorectal cancer. This medicine may be used for other purposes; ask your health care provider or pharmacist if you have questions. COMMON BRAND NAME(S): Vectibix What should I tell my health care provider before I take this medicine? They need to know if you have any of these conditions: -eye disease, vision problems -low levels of calcium, magnesium, or potassium in the blood -lung or breathing disease, like asthma -skin conditions or sensitivity -an unusual or allergic reaction to panitumumab, other medicines, foods, dyes, or preservatives -pregnant or trying to get pregnant -breast-feeding How should I use this medicine? This drug is given as an infusion into a vein. It is administered in a hospital or  clinic by a specially trained health care professional. Talk to your pediatrician regarding the use of this medicine in children. Special care may be needed. Overdosage: If you think you have taken too much of this medicine contact a poison control center or emergency room at once. NOTE: This medicine is only for you. Do not share this medicine with others. What if I miss a dose? It is important not to miss your dose. Call your doctor or health care professional if you are unable to keep an appointment. What may interact with this medicine? Do not take this medicine with any of the following medications: -bevacizumab This list may not describe all possible interactions. Give your health care provider a list of all the medicines, herbs, non-prescription drugs, or dietary supplements you use. Also tell them if you smoke, drink alcohol, or use illegal drugs. Some items may interact with your medicine. What should I watch for while using this medicine? Visit your doctor for checks on your progress. This drug may make you feel generally unwell. This is not uncommon, as chemotherapy can affect healthy cells as well as cancer cells. Report any side effects. Continue your course of treatment even though you feel ill unless your doctor tells you to stop. This medicine can make you more sensitive to the sun. Keep out of the sun while receiving this medicine and for 2 months after the last dose. If you cannot avoid being in the sun, wear protective clothing and use sunscreen. Do not use sun lamps or tanning beds/booths. In some cases, you may be given additional medicines to help with side effects. Follow all directions for   their use. Call your doctor or health care professional for advice if you get a fever, chills or sore throat, or other symptoms of a cold or flu. Do not treat yourself. This drug decreases your body's ability to fight infections. Try to avoid being around people who are sick. Avoid taking  products that contain aspirin, acetaminophen, ibuprofen, naproxen, or ketoprofen unless instructed by your doctor. These medicines may hide a fever. Do not become pregnant while taking this medicine and for 2 months after the last dose. Women should inform their doctor if they wish to become pregnant or think they might be pregnant. There is a potential for serious side effects to an unborn child. Talk to your health care professional or pharmacist for more information. Do not breast-feed an infant while taking this medicine or for 2 months after the last dose. What side effects may I notice from receiving this medicine? Side effects that you should report to your doctor or health care professional as soon as possible: -allergic reactions like skin rash, itching or hives, swelling of the face, lips, or tongue -breathing problems -changes in vision -eye pain -fast, irregular heartbeat -fever, chills -mouth sores -red spots on the skin -redness, blistering, peeling or loosening of the skin, including inside the mouth -signs and symptoms of kidney injury like trouble passing urine or change in the amount of urine -signs and symptoms of low blood pressure like dizziness; feeling faint or lightheaded, falls; unusually weak or tired -signs of low calcium like fast heartbeat, muscle cramps or muscle pain; pain, tingling, numbness in the hands or feet; seizures -signs and symptoms of low magnesium like muscle cramps, pain, or weakness; tremors; seizures; or fast, irregular heartbeat -signs and symptoms of low potassium like muscle cramps or muscle pain; chest pain; dizziness; feeling faint or lightheaded, falls; palpitations; breathing problems; or fast, irregular heartbeat -swelling of the ankles, feet, hands Side effects that usually do not require medical attention (report to your doctor or health care professional if they continue or are bothersome): -changes in skin like acne, cracks, skin  dryness -diarrhea -eyelash growth -headache -mouth sores -nail changes -nausea, vomiting This list may not describe all possible side effects. Call your doctor for medical advice about side effects. You may report side effects to FDA at 1-800-FDA-1088. Where should I keep my medicine? This drug is given in a hospital or clinic and will not be stored at home. NOTE: This sheet is a summary. It may not cover all possible information. If you have questions about this medicine, talk to your doctor, pharmacist, or health care provider.  2018 Elsevier/Gold Standard (2016-02-16 16:45:04)   

## 2017-06-08 ENCOUNTER — Encounter: Payer: Self-pay | Admitting: Hematology

## 2017-06-09 ENCOUNTER — Other Ambulatory Visit: Payer: Self-pay | Admitting: *Deleted

## 2017-06-09 ENCOUNTER — Telehealth: Payer: Self-pay | Admitting: Cardiovascular Disease

## 2017-06-09 MED ORDER — FUROSEMIDE 40 MG PO TABS: 40.0000 mg | ORAL_TABLET | Freq: Every day | ORAL | 1 refills | Status: DC

## 2017-06-09 MED ORDER — LOSARTAN POTASSIUM 100 MG PO TABS: 100.0000 mg | ORAL_TABLET | Freq: Every day | ORAL | 1 refills | Status: DC

## 2017-06-09 NOTE — Telephone Encounter (Signed)
New message    Patient daughter calling because Lasix and Losartan sent to wrong pharmacy. Wants medication overnighted 3 days remaining.  Patient DOB  is 2034-05-31      *STAT* If patient is at the pharmacy, call can be transferred to refill team.   1. Which medications need to be refilled? (please list name of each medication and dose if known) losartan (COZAAR) 100 MG tablet and furosemide (LASIX) 40 MG tablet  2. Which pharmacy/location (including street and city if local pharmacy) is medication to be sent to? Mailorder, [facility]  3. Do they need a 30 day or 90 day supply? [place]

## 2017-06-09 NOTE — Telephone Encounter (Signed)
Discussed with daughter and taken care of

## 2017-06-09 NOTE — Telephone Encounter (Signed)
Follow up     Pt daughter needs nurse to call [facility] and have them overnight the prescriptions due to them being called into the wrong place. Daughter wants a call back asap

## 2017-06-10 MED ORDER — LOSARTAN POTASSIUM 100 MG PO TABS: 100.0000 mg | ORAL_TABLET | Freq: Every day | ORAL | 0 refills | Status: DC

## 2017-06-10 MED ORDER — FUROSEMIDE 40 MG PO TABS: 40.0000 mg | ORAL_TABLET | Freq: Every day | ORAL | 0 refills | Status: DC

## 2017-06-10 NOTE — Telephone Encounter (Signed)
Called and spoke to daughter-daughter states that [facility] sent a fax last night and they need Dr. Oval Linsey to sign in order for them to fill patients medications.  Daughter very upset as she said she called a couple days ago and this was told it was taken care of.   Advised prescriptions were sent but unsure what is needed from [place].  Advised I would call [facility] to follow up.     Called [facility]-per [facility] we are sending prescriptions with her DOB [birth year] (matches her drivers license).   [facility] advised that patients insurance has DOB 18-Apr-2034, therefore when prescriptions are being sent it is creating a new profile that doesn't have insurance on file.   Advised they are unable to process prescriptions under the insurance due to the discrepancy in DOB.  Patient has not gotten medications from [place] filled through [place] [facility] in the past.     Called and spoke to daughter-advised that due to our profile and insurance profile DOB not matching, they are unable to fill. Apologized for the inconvenience but unfortunately I am unable to change the DOB in Epic or the prescriptions as this is what her drivers license states is her DOB (scanned in media).  Daughter request 30 day supply sent to [facility] until they are able to get license corrected.      30 day send and advised to call with further questions or concerns.

## 2017-06-10 NOTE — Telephone Encounter (Signed)
Needs med called to [place]  In her chart.   Fax was sent last Night after hours please act on it. ASAP. Per dauther inlaw urgent.  Please fax it back.

## 2017-06-20 ENCOUNTER — Ambulatory Visit (HOSPITAL_BASED_OUTPATIENT_CLINIC_OR_DEPARTMENT_OTHER): Payer: Medicare Other | Admitting: Hematology

## 2017-06-20 ENCOUNTER — Other Ambulatory Visit (HOSPITAL_BASED_OUTPATIENT_CLINIC_OR_DEPARTMENT_OTHER): Payer: Medicare Other

## 2017-06-20 ENCOUNTER — Encounter: Payer: Self-pay | Admitting: Hematology

## 2017-06-20 ENCOUNTER — Ambulatory Visit (HOSPITAL_BASED_OUTPATIENT_CLINIC_OR_DEPARTMENT_OTHER): Payer: Medicare Other

## 2017-06-20 VITALS — BP 119/56 | HR 75 | Temp 98.6°F | Resp 18 | Ht 66.0 in | Wt 179.1 lb

## 2017-06-20 DIAGNOSIS — C186 Malignant neoplasm of descending colon: Secondary | ICD-10-CM

## 2017-06-20 DIAGNOSIS — C187 Malignant neoplasm of sigmoid colon: Secondary | ICD-10-CM

## 2017-06-20 DIAGNOSIS — D649 Anemia, unspecified: Secondary | ICD-10-CM

## 2017-06-20 DIAGNOSIS — C787 Secondary malignant neoplasm of liver and intrahepatic bile duct: Secondary | ICD-10-CM

## 2017-06-20 DIAGNOSIS — I10 Essential (primary) hypertension: Secondary | ICD-10-CM

## 2017-06-20 DIAGNOSIS — R0609 Other forms of dyspnea: Secondary | ICD-10-CM

## 2017-06-20 DIAGNOSIS — I4891 Unspecified atrial fibrillation: Secondary | ICD-10-CM | POA: Diagnosis not present

## 2017-06-20 DIAGNOSIS — Z7189 Other specified counseling: Secondary | ICD-10-CM | POA: Diagnosis not present

## 2017-06-20 DIAGNOSIS — R110 Nausea: Secondary | ICD-10-CM | POA: Diagnosis not present

## 2017-06-20 DIAGNOSIS — R21 Rash and other nonspecific skin eruption: Secondary | ICD-10-CM | POA: Diagnosis not present

## 2017-06-20 DIAGNOSIS — L708 Other acne: Secondary | ICD-10-CM

## 2017-06-20 DIAGNOSIS — Z5112 Encounter for antineoplastic immunotherapy: Secondary | ICD-10-CM

## 2017-06-20 DIAGNOSIS — C7800 Secondary malignant neoplasm of unspecified lung: Secondary | ICD-10-CM

## 2017-06-20 LAB — CBC WITH DIFFERENTIAL/PLATELET
BASO%: 0.2 % (ref 0.0–2.0)
Basophils Absolute: 0 10*3/uL (ref 0.0–0.1)
EOS ABS: 0.3 10*3/uL (ref 0.0–0.5)
EOS%: 2.8 % (ref 0.0–7.0)
HCT: 36.6 % (ref 34.8–46.6)
HGB: 11.7 g/dL (ref 11.6–15.9)
LYMPH%: 17 % (ref 14.0–49.7)
MCH: 29.2 pg (ref 25.1–34.0)
MCHC: 32 g/dL (ref 31.5–36.0)
MCV: 91.3 fL (ref 79.5–101.0)
MONO#: 1.2 10*3/uL — ABNORMAL HIGH (ref 0.1–0.9)
MONO%: 12.6 % (ref 0.0–14.0)
NEUT%: 67.4 % (ref 38.4–76.8)
NEUTROS ABS: 6.2 10*3/uL (ref 1.5–6.5)
Platelets: 301 10*3/uL (ref 145–400)
RBC: 4.01 10*6/uL (ref 3.70–5.45)
RDW: 15 % — ABNORMAL HIGH (ref 11.2–14.5)
WBC: 9.2 10*3/uL (ref 3.9–10.3)
lymph#: 1.6 10*3/uL (ref 0.9–3.3)

## 2017-06-20 LAB — COMPREHENSIVE METABOLIC PANEL
ALBUMIN: 3.5 g/dL (ref 3.5–5.0)
ALK PHOS: 100 U/L (ref 40–150)
ALT: 11 U/L (ref 0–55)
AST: 16 U/L (ref 5–34)
Anion Gap: 9 mEq/L (ref 3–11)
BUN: 12.5 mg/dL (ref 7.0–26.0)
CALCIUM: 8.9 mg/dL (ref 8.4–10.4)
CO2: 23 mEq/L (ref 22–29)
CREATININE: 0.8 mg/dL (ref 0.6–1.1)
Chloride: 106 mEq/L (ref 98–109)
EGFR: >60 mL/min/{1.73_m2} (ref >60)
Glucose: 87 mg/dl (ref 70–140)
Potassium: 3.6 mEq/L (ref 3.5–5.1)
Sodium: 137 mEq/L (ref 136–145)
TOTAL PROTEIN: 7.7 g/dL (ref 6.4–8.3)
Total Bilirubin: 0.54 mg/dL (ref 0.20–1.20)

## 2017-06-20 LAB — MAGNESIUM: Magnesium: 2.2 mg/dl (ref 1.5–2.5)

## 2017-06-20 MED ORDER — PANITUMUMAB CHEMO INJECTION 100 MG/5ML
6.0000 mg/kg | Freq: Once | INTRAVENOUS | Status: AC
  Administered 2017-06-20: 500 mg via INTRAVENOUS
  Filled 2017-06-20: qty 20

## 2017-06-20 MED ORDER — SODIUM CHLORIDE 0.9 % IV SOLN
Freq: Once | INTRAVENOUS | Status: AC
  Administered 2017-06-20: 12:00:00 via INTRAVENOUS

## 2017-06-20 MED ORDER — CAPECITABINE 500 MG PO TABS: 1000.0000 mg/m2 | ORAL_TABLET | Freq: Two times a day (BID) | ORAL | 0 refills | Status: DC

## 2017-06-20 MED FILL — CAPECITABINE 500 MG TABLET: 500 | 10 days supply | Qty: 80 | Fill #0

## 2017-06-20 NOTE — Patient Instructions (Signed)
[facility] Discharge Instructions for Patients Receiving Chemotherapy  Today you received the following chemotherapy agents Vectibix  To help prevent nausea and vomiting after your treatment, we encourage you to take your nausea medication as prescribed.   If you develop nausea and vomiting that is not controlled by your nausea medication, call the clinic.   BELOW ARE SYMPTOMS THAT SHOULD BE REPORTED IMMEDIATELY:  *FEVER GREATER THAN 100.5 F  *CHILLS WITH OR WITHOUT FEVER  NAUSEA AND VOMITING THAT IS NOT CONTROLLED WITH YOUR NAUSEA MEDICATION  *UNUSUAL SHORTNESS OF BREATH  *UNUSUAL BRUISING OR BLEEDING  TENDERNESS IN MOUTH AND THROAT WITH OR WITHOUT PRESENCE OF ULCERS  *URINARY PROBLEMS  *BOWEL PROBLEMS  UNUSUAL RASH Items with * indicate a potential emergency and should be followed up as soon as possible.  Feel free to call the clinic should you have any questions or concerns. The clinic phone number is [phone].  Please show the CHEMO ALERT CARD at check-in to the Emergency Department and triage nurse.   Panitumumab Solution for Injection What is this medicine? PANITUMUMAB (pan i TOOM ue mab) is a monoclonal antibody. It is used to treat colorectal cancer. This medicine may be used for other purposes; ask your health care provider or pharmacist if you have questions. COMMON BRAND NAME(S): Vectibix What should I tell my health care provider before I take this medicine? They need to know if you have any of these conditions: -eye disease, vision problems -low levels of calcium, magnesium, or potassium in the blood -lung or breathing disease, like asthma -skin conditions or sensitivity -an unusual or allergic reaction to panitumumab, other medicines, foods, dyes, or preservatives -pregnant or trying to get pregnant -breast-feeding How should I use this medicine? This drug is given as an infusion into a vein. It is administered in a hospital or  clinic by a specially trained health care professional. Talk to your pediatrician regarding the use of this medicine in children. Special care may be needed. Overdosage: If you think you have taken too much of this medicine contact a poison control center or emergency room at once. NOTE: This medicine is only for you. Do not share this medicine with others. What if I miss a dose? It is important not to miss your dose. Call your doctor or health care professional if you are unable to keep an appointment. What may interact with this medicine? Do not take this medicine with any of the following medications: -bevacizumab This list may not describe all possible interactions. Give your health care provider a list of all the medicines, herbs, non-prescription drugs, or dietary supplements you use. Also tell them if you smoke, drink alcohol, or use illegal drugs. Some items may interact with your medicine. What should I watch for while using this medicine? Visit your doctor for checks on your progress. This drug may make you feel generally unwell. This is not uncommon, as chemotherapy can affect healthy cells as well as cancer cells. Report any side effects. Continue your course of treatment even though you feel ill unless your doctor tells you to stop. This medicine can make you more sensitive to the sun. Keep out of the sun while receiving this medicine and for 2 months after the last dose. If you cannot avoid being in the sun, wear protective clothing and use sunscreen. Do not use sun lamps or tanning beds/booths. In some cases, you may be given additional medicines to help with side effects. Follow all directions for   their use. Call your doctor or health care professional for advice if you get a fever, chills or sore throat, or other symptoms of a cold or flu. Do not treat yourself. This drug decreases your body's ability to fight infections. Try to avoid being around people who are sick. Avoid taking  products that contain aspirin, acetaminophen, ibuprofen, naproxen, or ketoprofen unless instructed by your doctor. These medicines may hide a fever. Do not become pregnant while taking this medicine and for 2 months after the last dose. Women should inform their doctor if they wish to become pregnant or think they might be pregnant. There is a potential for serious side effects to an unborn child. Talk to your health care professional or pharmacist for more information. Do not breast-feed an infant while taking this medicine or for 2 months after the last dose. What side effects may I notice from receiving this medicine? Side effects that you should report to your doctor or health care professional as soon as possible: -allergic reactions like skin rash, itching or hives, swelling of the face, lips, or tongue -breathing problems -changes in vision -eye pain -fast, irregular heartbeat -fever, chills -mouth sores -red spots on the skin -redness, blistering, peeling or loosening of the skin, including inside the mouth -signs and symptoms of kidney injury like trouble passing urine or change in the amount of urine -signs and symptoms of low blood pressure like dizziness; feeling faint or lightheaded, falls; unusually weak or tired -signs of low calcium like fast heartbeat, muscle cramps or muscle pain; pain, tingling, numbness in the hands or feet; seizures -signs and symptoms of low magnesium like muscle cramps, pain, or weakness; tremors; seizures; or fast, irregular heartbeat -signs and symptoms of low potassium like muscle cramps or muscle pain; chest pain; dizziness; feeling faint or lightheaded, falls; palpitations; breathing problems; or fast, irregular heartbeat -swelling of the ankles, feet, hands Side effects that usually do not require medical attention (report to your doctor or health care professional if they continue or are bothersome): -changes in skin like acne, cracks, skin  dryness -diarrhea -eyelash growth -headache -mouth sores -nail changes -nausea, vomiting This list may not describe all possible side effects. Call your doctor for medical advice about side effects. You may report side effects to FDA at 1-800-FDA-1088. Where should I keep my medicine? This drug is given in a hospital or clinic and will not be stored at home. NOTE: This sheet is a summary. It may not cover all possible information. If you have questions about this medicine, talk to your doctor, pharmacist, or health care provider.  2018 Elsevier/Gold Standard (2016-02-16 16:45:04)   

## 2017-06-20 NOTE — Progress Notes (Signed)
[place]  [phone] Fax:([phone]) [phone]  Clinic Follow up Note   Patient Care Team: Moshe Cipro, MD as PCP - General (Internal Medicine) Alexis Frock, MD as Consulting Physician (Urology) Michael Boston, MD as Consulting Physician (General Surgery) Toma Deiters, MD as Referring Physician (Gastroenterology) 06/20/2017  SUMMARY OF ONCOLOGIC HISTORY: Oncology History   Cancer Staging Cancer of left colon [facility]) Staging form: Colon and Rectum, AJCC 8th Edition - Clinical stage from 09/04/2016: Stage IVB (cTX, cNX, pM1b) - Signed by Truitt Merle, MD on 11/12/2016       Cancer of left colon ([place])   09/02/2016 Procedure    Colonoscopy Diverticulosis (scattered). Pt has about 10 cm structure starting at 20 cm with abnormal mucosa. Biopsies taken.       09/02/2016 Pathology Results    MODERATELY DIFFERENTIATED ADENOCARCINOMA OF COLON AT 20 CM      09/04/2016 Initial Diagnosis    Cancer of left colon ([place])      09/06/2016 Imaging    CT CAP w Contrast 1. No acute abdominopelvic process.  2. Heterogenous hypodense area involving the medial edge of the left kidney with multiple small soft tissue like stranding involving the left pararenal space. Findings are suspicious for malignancy of the left kidney with small pararenal metastases. MRI abdomen with contrast should be considered to further evaluate.  3. Vague hypodensity involving the lateral segment of the liver. This can also be reevaluated with MRI.  4. Partially visualized 5 mm posterior right lower lobe nodular density. CT chest without contrast follow up is recommended.  5. Complex multilobular left ovarian lesion may reflect a cluster of cysts.      10/10/2016 Procedure    Operative Report by Dr. Tresa Moore on 10/18/16 Procedure: 1) Cystoscopy, left retrograde pyelogram interpretation. 2) Left diagnostic uteroscopy. 3) Insertion of left uteral stent; 5 x 24 Polaris with tether. Findings: 1)  Unremarkable bladder. 2) Unremarkable left retrograde pyelogram. 3) No evidence of intraluminal urothelial neoplasm whatsoever with insepction of the left kidney and ureter times several. 4) Successful placement of left ureteral stent, proximal in renal pelvis and distal in urinary bladder.      10/15/2016 Imaging    MRI abdomen pelvis 1. There are 2 enhancing hepatic lesions highly worrisome for metastatic disease, likely metastatic colon cancer. 2. The lesion of concern in the anterior suprahilar lip of the left kidney is also enhancing, worrisome for neoplasm. This has a somewhat atypical appearance for renal cell carcinoma and could reflect a metastasis as well. 3. New left-sided hydroureter with delayed contrast excretion consistent with distal ureteral obstruction. Specific etiology uncertain. 4. Complex cystic and solid left adnexal lesion could reflect cystic neoplasm, although does not appear aggressive or appear to reflect the cause of the distal left ureteral obstruction. 5. Sigmoid colon wall thickening could be related to reported newly diagnosed colon cancer.      11/06/2016 PET scan    PET 11/06/2016 IMPRESSION: 1. Hypermetabolic pulmonary and hepatic lesions, most consistent with metastatic colon cancer, with the hypermetabolic primary located in the sigmoid colon. 2. Left renal and left adnexal lesions, better seen and evaluated on 10/15/2016. 3. Persistent moderate left hydronephrosis. 4. Aortic atherosclerosis (ICD10-170.0).      11/11/2016 Pathology Results    Liver, needle/core biopsy, left lobe - METASTATIC ADENOCARCINOMA. The histologic features are consistent with a primary gastrointestinal adenocarcinoma.      05/19/2017 Imaging    CT Abdomen Pelvis, 05/19/2017 IMPRESSION: 1. Worsening pulmonary and hepatic metastatic disease.  Primary sigmoid lesion is stable to minimally enlarged. 2. Stable to slight enlargement of a left perinephric mass, likely  a metastasis. Renal cell carcinoma is not excluded. Associated pelvocaliectasis versus mild hydronephrosis. 3. Borderline left periaortic lymph nodes, slightly enlarged. 4. Cholelithiasis. 5.  Aortic atherosclerosis (ICD10-170.0). 6. Cystic and solid lesion in the left adnexa, as on prior exams, likely ovarian in origin.      06/06/2017 -  Chemotherapy    IV antibody Vectibix every [redacted] weeks along with oral chemo Xeloda for 1 week on and 1 week off starting 06/06/17      CURRENT THERAPY: IV antibody Vectibix every [redacted] weeks along with oral chemo Xeloda for 1 week on and 1 week off starting 06/06/17  INTERVAL HISTORY: Ms. Mary [name] returns for f/u and next vectibix; she began xeloda 7 days off/7 days on and vectibix q14 days on 06/06/17. She had nausea and vomiting for 1 day after treatment. She took zofran PRN for the whole week on Xeloda which helped manage symptoms. She has mild fatigue, requires afternoon rest period. Eating and drinking well, 3 BMs per day, no diarrhea. Occasionally gets short of breath when walking to mailbox, relief with rest. Denies cough, chest pain, fever, chills, or redness to hands/feet. She noticed facial skin rash few days after vectibix and has mild diffuse itching to her arms, face, head, and chest. Has been using hydrocortisone cream.   REVIEW OF SYSTEMS:   Constitutional: Denies fevers, chills or abnormal weight loss (+) mild fatigue over baseline   Eyes: Denies blurriness of vision Ears, nose, mouth, throat, and face: Denies mucositis or sore throat Respiratory: Denies cough or wheezes (+) DOE, relieves with rest  Cardiovascular: Denies palpitation, chest discomfort (+) lower extremity swelling, stable, no calf tenderness  Gastrointestinal:  Denies constipation, diarrhea, heartburn or change in bowel habits (+) n/v on day 1 of xeloda/vectibix cycle, controlled with zofran while on xeloda.   Skin: (+) skin rash to face, developed few days after vectibix (+)  diffuse itching, arms, face, head, and chest without rash  Lymphatics: Denies new lymphadenopathy or easy bruising Neurological:Denies numbness, tingling or new weaknesses Behavioral/Psych: Mood is stable, no new changes  All other systems were reviewed with the patient and are negative.  MEDICAL HISTORY:  Past Medical History:  Diagnosis Date  . Aneurysm of ascending aorta ([facility]) 12/03/2016   3.7 cm by echo 10/2016  . Colon cancer [facility])    dx via biospy from colonoscopy-- malignant ---  currently having work-up done w/ dr gross (general surgeon) and coordinate surgery w/ urologsit for renal tumor  . Dysrhythmia    A  Fib  . Essential hypertension 10/31/2016  . GERD (gastroesophageal reflux disease)   . Glaucoma   . Hiatal hernia   . HOH (hard of hearing)    bilateral even w/ hearing aids  . Hyperlipidemia   . Hypertension   . Kidney tumor    left side  . Legally blind in right eye, as defined in [place]    due to macular degeneration  . Macular degeneration   . Nausea    intermittant due to colon mass  . Ovarian cyst   . Persistent atrial fibrillation ([place]) 10/31/2016  . PONV (postoperative nausea and vomiting)   . Wears glasses   . Wears hearing aid    bilateral    SURGICAL HISTORY: Past Surgical History:  Procedure Laterality Date  . BREAST CYST EXCISION  1990   benign  . COLONOSCOPY  09/04/2016  .  ESOPHAGOGASTRODUODENOSCOPY  11/07/2014  . NASAL SINUS SURGERY    . TONSILLECTOMY      I have reviewed the social history and family history with the patient and they are unchanged from previous note.  ALLERGIES:  is allergic to benazepril and bactrim [sulfamethoxazole-trimethoprim].  MEDICATIONS:  Current Outpatient Medications  Medication Sig Dispense Refill  . amLODipine (NORVASC) 5 MG tablet Take 1 tablet (5 mg total) by mouth daily. 90 tablet 1  . capecitabine (XELODA) 500 MG tablet Take 4 tablets (2,000 mg total) by mouth 2 (two) times daily after a meal. Take on  days 1-7 and days 15-21, every 28 days 112 tablet 3  . diltiazem (CARDIZEM CD) 240 MG 24 hr capsule Take 1 capsule (240 mg total) by mouth daily. 90 capsule 1  . escitalopram (LEXAPRO) 10 MG tablet Take 10 mg by mouth daily.    . fexofenadine (ALLEGRA) 180 MG tablet Take 180 mg by mouth daily.    . furosemide (LASIX) 40 MG tablet Take 1 tablet (40 mg total) by mouth daily. 30 tablet 0  . latanoprost (XALATAN) 0.005 % ophthalmic solution Place 1 drop into both eyes at bedtime.    Marland Kitchen losartan (COZAAR) 100 MG tablet Take 1 tablet (100 mg total) by mouth daily. 30 tablet 0  . Multiple Vitamins-Minerals (VITEYES AREDS ADVANCED) CAPS Take 1 capsule by mouth daily.    Marland Kitchen omeprazole (PRILOSEC) 40 MG capsule Take 40 mg by mouth daily.    . ondansetron (ZOFRAN ODT) 4 MG disintegrating tablet Take 1 tablet (4 mg total) by mouth every 8 (eight) hours as needed for nausea or vomiting. 20 tablet 0  . ondansetron (ZOFRAN) 8 MG tablet Take 1 tablet (8 mg total) by mouth every 8 (eight) hours as needed for nausea. 30 tablet 2  . Polyethyl Glycol-Propyl Glycol (SYSTANE) 0.4-0.3 % SOLN Apply to eye as needed.    . traMADol (ULTRAM) 50 MG tablet Take 1 tablet (50 mg total) by mouth 3 (three) times daily as needed. For increased pain. 270 tablet 0   Current Facility-Administered Medications  Medication Dose Route Frequency Provider Last Rate Last Dose  . 0.9 %  sodium chloride infusion  250 mL Intravenous Continuous Skeet Latch, MD      . sodium chloride flush (NS) 0.9 % injection 3 mL  3 mL Intravenous Q12H Skeet Latch, MD      . sodium chloride flush (NS) 0.9 % injection 3 mL  3 mL Intravenous PRN Skeet Latch, MD        PHYSICAL EXAMINATION: ECOG PERFORMANCE STATUS: 2 - Symptomatic, <50% confined to bed  Vitals:   06/20/17 1056  BP: (!) 119/56  Pulse: 75  Resp: 18  Temp: 98.6 F (37 C)  SpO2: 99%   Filed Weights   06/20/17 1056  Weight: 179 lb 1.6 oz (81.2 kg)    GENERAL:alert, no  distress and comfortable SKIN: skin color, texture, turgor are normal, no significant lesions (+) mild frontal and perioral acne type rash  EYES: normal, Conjunctiva are pink and non-injected, sclera clear OROPHARYNX:no thrush or ulcers  NECK: supple, thyroid normal size, non-tender, without nodularity LYMPH:  no palpable cervical or supraclavicular lymphadenopathy LUNGS: clear to auscultation bilaterally with normal breathing effort HEART: regular rate & rhythm and no murmurs (+) bilateral lower extremity edema ABDOMEN:abdomen soft, non-tender and normal bowel sounds Musculoskeletal:no cyanosis of digits and no clubbing  NEURO: alert & oriented x 3 with fluent speech, no focal motor/sensory deficits  LABORATORY DATA:  I have reviewed the data as listed CBC Latest Ref Rng & Units 06/20/2017 06/06/2017 05/08/2017  WBC 3.9 - 10.3 10e3/uL 9.2 10.1 9.9  Hemoglobin 11.6 - 15.9 g/dL 11.7 12.2 11.9  Hematocrit 34.8 - 46.6 % 36.6 36.7 36.8  Platelets 145 - 400 10e3/uL 301 332 310     CMP Latest Ref Rng & Units 06/20/2017 06/06/2017 05/08/2017  Glucose 70 - 140 mg/dl 87 90 96  BUN 7.0 - 26.0 mg/dL 12.5 15.8 20.9  Creatinine 0.6 - 1.1 mg/dL 0.8 0.9 1.0  Sodium 136 - 145 mEq/L 137 139 139  Potassium 3.5 - 5.1 mEq/L 3.6 4.2 4.7  Chloride 96 - 106 mmol/L - - -  CO2 22 - 29 mEq/L _0 Calcium 8.4 - 10.4 mg/dL 8.9 9.7 9.6  Total Protein 6.4 - 8.3 g/dL 7.7 7.8 7.9  Total Bilirubin 0.20 - 1.20 mg/dL 0.54 0.44 0.59  Alkaline Phos 40 - 150 U/L 100 103 102  AST 5 - 34 U/L _1 ALT 0 - 55 U/L _2 [facility] 11/11/16   Diagnosis 11/11/16 Liver, needle/core biopsy, left lobe - METASTATIC ADENOCARCINOMA. - SEE COMMENT. Microscopic Comment The histologic features are consistent with a primary gastrointestinal adenocarcinoma. Dr. Vicente Males has reviewed the case and concurs with this interpretation. There is likely sufficient tumor present for additional studies,  if requested. Enid Cutter MD Pathologist, Electronic Signature (Case signed 11/12/2016)  Diagnosis 09/04/2016 MODERATELY DIFFERENTIATED ADENOCARCINOMA OF COLON AT 20 CM Tumor Site: Colon at 20 cm Tumor Type: Adenocarcinoma Tumore Size: Unknown  Grade: Moderately differentiated   RADIOGRAPHIC STUDIES: I have personally reviewed the radiological images as listed and agreed with the findings in the report.  CT Chest 06/06/17  IMPRESSION: 1. There are multiple new and enlarging pulmonary nodules throughout the lungs bilaterally. Additionally, a few of the nodules have decreased in size when compared to prior. 2. Interval increase in size of precarinal lymph node. 3. Interval increase in size of hepatic lesions. 4. Aortic Atherosclerosis (ICD10-I70.0).  CT Abdomen Pelvis, 05/19/2017 IMPRESSION: 1. Worsening pulmonary and hepatic metastatic disease. Primary sigmoid lesion is stable to minimally enlarged. 2. Stable to slight enlargement of a left perinephric mass, likely a metastasis. Renal cell carcinoma is not excluded. Associated pelvocaliectasis versus mild hydronephrosis. 3. Borderline left periaortic lymph nodes, slightly enlarged. 4. Cholelithiasis. 5. Aortic atherosclerosis (ICD10-170.0). 6. Cystic and solid lesion in the left adnexa, as on prior exams, likely ovarian in origin.  PET 11/06/2016 IMPRESSION: 1. Hypermetabolic pulmonary and hepatic lesions, most consistent with metastatic colon cancer, with the hypermetabolic primary located in the sigmoid colon. 2. Left renal and left adnexal lesions, better seen and evaluated on 10/15/2016. 3. Persistent moderate left hydronephrosis. 4. Aortic atherosclerosis (ICD10-170.0).  MRI Abdomen Pelvis w/wo Contrast 10/15/2016 IMPRESSION: 1. There are 2 enhancing hepatic lesions highly worrisome for metastatic disease, likely metastatic colon cancer. 2. The lesion of concern in the anterior suprahilar lip of the  left kidney is also enhancing, worrisome for neoplasm. This has a somewhat atypical appearance for renal cell carcinoma and could reflect a metastasis as well. 3. New left-sided hydroureter with delayed contrast excretion consistent with distal ureteral obstruction. Specific etiology uncertain. 4. Complex cystic and solid left adnexal lesion could reflect cystic neoplasm, although does not appear aggressive or appear to reflect the cause of the distal left ureteral obstruction. 5. Sigmoid colon wall thickening could be related to reported newly diagnosed colon cancer.  CT CAP  w Contrast 09/06/2016 (done outside) IMPRESSION: 1. No acute abdominopelvic process.  2. Heterogenous hypodense area involving the medial edge of the left kidney with multiple small soft tissue like stranding involving the left pararenal space. Findings are suspicious for malignancy of the left kidney with small pararenal metastases. MRI abdomen with contrast should be considered to further evaluate.  3. Vague hypodensity involving the lateral segment of the liver. This can also be reevaluated with MRI.  4. Partially visualized 5 mm posterior right lower lobe nodular density. CT chest without contrast follow up is recommended.  5. Complex multilobular left ovarian lesion may reflect a cluster of cysts.    PROCEDURES Echocardiogram 11/03/16 - Left ventricle: The cavity size was normal. Wall thickness was increased in a pattern of mild LVH. Indeterminant diastolic function (atrial fibrillation). Systolic function was normal. The estimated ejection fraction was in the range of 60% to 65%.   Operative Report by Dr. Tresa Moore on 10/10/16 Procedure: 1) Cystoscopy, left retrograde pyelogram interpretation. 2) Left diagnostic uteroscopy. 3) Insertion of left uteral stent; 5 x 24 Polaris with tether. Findings: 1) Unremarkable bladder. 2) Unremarkable left retrograde pyelogram. 3) No evidence of intraluminal  urothelial neoplasm whatsoever with insepction of the left kidney and ureter times several. 4) Successful placement of left ureteral stent, proximal in renal pelvis and distal in urinary bladder.  Colonoscopy 09/02/2016 FINDINGS: Diverticulosis (scattered). Pt has about 10 cm structure starting at 20 cm with abnormal mucosa. The colonoscopy scope was not able to advance due to structure, and upper endoscopy scope was used and it was advanced through the stricture to see ileocecal valve. Biopsies at the stricture were taken.   ASSESSMENT & PLAN:  Mary [name] is a 83 y.o. female   1. Adenocarcinoma of left colon, sigmoid colon, TxNxM1 with liver and lung mets, stage IV, KRAS/NRS wild type, MSI-stable  2. Left renal mass and left hydronephrosos 3. Complex cyst of left ovary 4. Atrial fibrillation 5. HTN 6. Nutrition 7. Left flank and abdominal pain 8. Anemia 9. Goals of care discussion 10. LE swelling 11. Facial acne type rash, secondary to vectibix  Ms. Perno appears stable today, tolerating xeloda and vectibix well overall. She has very mild facial acneiform rash secondary to vectibix, managed with topical hydrocortisone. We reviewed possibility for escalating topical and oral medication if rash worsens. For body itching, I recommend eucerin lotion, oatmeal bath, and oral benadryl at night PRN if having trouble sleeping due to itching. She may also try claritin. For nausea, she has zofran at home, this is adequate for her for now; may consider compazine or other anti-emetic if this becomes insufficient. For fatigue and exertional dyspnea, I recommend her to increase physical activity. Her nutrition and oral intake are adequate today, weight is stable. BP slightly low today, she will increase liquids. Labs reviewed, CBC and Cmet adequate for treatment, she will proceed with vectibix and xeloda today. Because she will not get treatment for 3 weeks, she will take xeloda 2000 mg BID x10 days beginning  today, then will be off for approx 10 days until next f/u with Dr. Burr Medico. At that time, if she tolerates well and labs are adequate, will resume 7 days on/7 days off schedule with vectibix q14 days. I disucssed with Dr. Burr Medico who recommended this plan, pharmacy aware. Rx send to [facility] outpatient pharm, patient will pick up today. She will return in 3 weeks for f/u and next cycle.  PLAN -Continue hydrocortisone for acneiform rash -Eucerin, oatmeal bath, po  benadryl PRN and claritin for body itching -Continue zofran PRN for nausea, has ODT and oral supple at home -Increase exercise for fatigue and exertional dyspnea -Labs reviewed, proceed with vectibix and xeloda today, start xeloda 2000 mg BID x10 days then off until next f/u -Return in 3 weeks for f/u with Dr. Burr Medico and next treatment    All questions were answered. The patient knows to call the clinic with any problems, questions or concerns. No barriers to learning was detected.     Alla Feeling, NP 06/20/17

## 2017-07-01 DIAGNOSIS — H6121 Impacted cerumen, right ear: Secondary | ICD-10-CM | POA: Diagnosis not present

## 2017-07-07 ENCOUNTER — Other Ambulatory Visit: Payer: Self-pay | Admitting: Hematology

## 2017-07-07 DIAGNOSIS — C186 Malignant neoplasm of descending colon: Secondary | ICD-10-CM

## 2017-07-07 NOTE — Progress Notes (Signed)
[place]  [phone] [phone]  Clinic Follow Up Note   Patient Care Team: Moshe Cipro, MD as PCP - General (Internal Medicine) Alexis Frock, MD as Consulting Physician (Urology) Michael Boston, MD as Consulting Physician (General Surgery) Toma Deiters, MD as Referring Physician (Gastroenterology) 07/09/2017  CHIEF COMPLAINTS:  Metastatic sigmoid colon Adenocarcinoma  Oncology History   Cancer Staging Cancer of left colon [facility]) Staging form: Colon and Rectum, AJCC 8th Edition - Clinical stage from 09/04/2016: Stage IVB (cTX, cNX, pM1b) - Signed by Truitt Merle, MD on 11/12/2016       Cancer of left colon ([place])   09/02/2016 Procedure    Colonoscopy Diverticulosis (scattered). Pt has about 10 cm structure starting at 20 cm with abnormal mucosa. Biopsies taken.       09/02/2016 Pathology Results    MODERATELY DIFFERENTIATED ADENOCARCINOMA OF COLON AT 20 CM      09/04/2016 Initial Diagnosis    Cancer of left colon ([place])      09/06/2016 Imaging    CT CAP w Contrast 1. No acute abdominopelvic process.  2. Heterogenous hypodense area involving the medial edge of the left kidney with multiple small soft tissue like stranding involving the left pararenal space. Findings are suspicious for malignancy of the left kidney with small pararenal metastases. MRI abdomen with contrast should be considered to further evaluate.  3. Vague hypodensity involving the lateral segment of the liver. This can also be reevaluated with MRI.  4. Partially visualized 5 mm posterior right lower lobe nodular density. CT chest without contrast follow up is recommended.  5. Complex multilobular left ovarian lesion may reflect a cluster of cysts.      10/10/2016 Procedure    Operative Report by Dr. Tresa Moore on 10/18/16 Procedure: 1) Cystoscopy, left retrograde pyelogram interpretation. 2) Left diagnostic uteroscopy. 3) Insertion of left uteral stent; 5 x 24 Polaris  with tether. Findings: 1) Unremarkable bladder. 2) Unremarkable left retrograde pyelogram. 3) No evidence of intraluminal urothelial neoplasm whatsoever with insepction of the left kidney and ureter times several. 4) Successful placement of left ureteral stent, proximal in renal pelvis and distal in urinary bladder.      10/15/2016 Imaging    MRI abdomen pelvis 1. There are 2 enhancing hepatic lesions highly worrisome for metastatic disease, likely metastatic colon cancer. 2. The lesion of concern in the anterior suprahilar lip of the left kidney is also enhancing, worrisome for neoplasm. This has a somewhat atypical appearance for renal cell carcinoma and could reflect a metastasis as well. 3. New left-sided hydroureter with delayed contrast excretion consistent with distal ureteral obstruction. Specific etiology uncertain. 4. Complex cystic and solid left adnexal lesion could reflect cystic neoplasm, although does not appear aggressive or appear to reflect the cause of the distal left ureteral obstruction. 5. Sigmoid colon wall thickening could be related to reported newly diagnosed colon cancer.      11/06/2016 PET scan    PET 11/06/2016 IMPRESSION: 1. Hypermetabolic pulmonary and hepatic lesions, most consistent with metastatic colon cancer, with the hypermetabolic primary located in the sigmoid colon. 2. Left renal and left adnexal lesions, better seen and evaluated on 10/15/2016. 3. Persistent moderate left hydronephrosis. 4. Aortic atherosclerosis (ICD10-170.0).      11/11/2016 Pathology Results    Liver, needle/core biopsy, left lobe - METASTATIC ADENOCARCINOMA. The histologic features are consistent with a primary gastrointestinal adenocarcinoma.      05/19/2017 Imaging    CT Abdomen Pelvis, 05/19/2017 IMPRESSION: 1. Worsening pulmonary  and hepatic metastatic disease. Primary sigmoid lesion is stable to minimally enlarged. 2. Stable to slight enlargement of a left  perinephric mass, likely a metastasis. Renal cell carcinoma is not excluded. Associated pelvocaliectasis versus mild hydronephrosis. 3. Borderline left periaortic lymph nodes, slightly enlarged. 4. Cholelithiasis. 5.  Aortic atherosclerosis (ICD10-170.0). 6. Cystic and solid lesion in the left adnexa, as on prior exams, likely ovarian in origin.      06/06/2017 -  Chemotherapy    IV antibody Vectibix every [redacted] weeks along with oral chemo Xeloda for 1 week on and 1 week off starting 06/06/17       HISTORY OF PRESENTING ILLNESS (10/25/2016):  Mary [name] 83 y.o. female is here because of adenocarcinoma of the colon and a left renal mass. The pt noticed worsening episodes of abdominal cramping and nausea with bowel movements in July 2017. She had an abdominal US performed, which was unremarkable. A colonoscopy was then performed at the patient's request, which found a mass about 20 cm from the anal verge. Biopsy showed moderately differentiated adenocarcinoma. MRI done, with a lesion noticed in the left kidney near the pelvis, a 11 mm hepatic lesion, 4 mm lung nodule, and possible cystic left ovarian mass. Pt saw Dr. Johney Maine for information on abdominal surgery for her colon, which she declined. She also saw Dr. Lorna Dibble, who is considering a nephrectomy, but is planning a sternoscopy to better characterize the kidney lesion. She presents today for continued treatment.    She presents today with her son and daughter-in-law. The symptoms started in late July 2017. She had some vomiting, abdominal pain, and constipation, but not bleeding. Her PCP never found any anemia. Initially she lost her appetite, but now she is taking multiple vitamins and supplements, so her appetite is back. Her bowel has improved, but she still has abdominal pain that comes and goes, lasting minutes. The pain is tolerable with aleve. She did not tolerate Tramadol well; nausea and vomiting. She has some occasional left sides back  pain. Denies weight loss, or any other concerns. She is able to do everything she needs to around the house.   She had a partial hysterectomy, and a lumpectomy. She has a history of HTN, atrial fibrillation, high cholesterol. She has some hearing loss and can not see out of her right eye. Not a diabetic, but her son and her mother are. No family history of colon cancer. Her sister had breast cancer in her 30's and her brother had lung cancer in his 64's. Never smoker. Doesn't drink alcohol. 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 She has two sons; one lives an hour away, and the other lives in [place]. She lives alone.   CURRENT THERAPY: IV antibody Vectibix every [redacted] weeks along with oral chemo Xeloda for 1 week on and 1 week off starting 06/06/17. For cycle 2 she had 2030m Xeloda for 10 days and then 10 days off.   INTERVAL HISTORY:   AJAELEE LAUGHTERis here for a follow up. She presents in the clinic today accompanied by her daughter-in-law.   She shares update on the rash on her face and scalp and feet. She used benadryl and hydrocortisone BID. She was not given antibiotics cream yet. The itchiness was waking her up at night but has since gotten better. She notes the rash has improved the last few days. She would like a refill on her Zofran, which she takes once daily. She did not know her Zofran was dissolvable. She  was able to tolerate the 26m better than the 844mZofran. She notes sometimes she will experience several bad days with nausea and cramping when she needs to have a BM that will dissipate as the day goes on. She does not think this is related to chemo as this has been occurring since before treatment. She was able to tolerate the 10 days of Xeloda but was glad it was over. Her pain is not worse, sightly better. She is eating differently and her weight is stable.     MEDICAL HISTORY:  Past Medical History:  Diagnosis Date  . Aneurysm of ascending aorta ([facility]) 12/03/2016   3.7 cm by echo  10/2016  . [facility]   [facility] from colonoscopy-- malignant ---  currently having work-up done w/ dr gross (general surgeon) and coordinate surgery w/ urologsit for renal tumor  . Dysrhythmia    A  Fib  . Essential hypertension 10/31/2016  . GERD (gastroesophageal reflux disease)   . Glaucoma   . Hiatal hernia   . HOH (hard of hearing)    bilateral even w/ hearing aids  . Hyperlipidemia   . Hypertension   . Kidney tumor    left side  . Legally blind in right eye, [place] defined in [place]  due to macular degeneration  . Macular degeneration   . Nausea    intermittant due to colon mass  . Ovarian cyst   . Persistent atrial fibrillation ([facility]3/22/2018  . PONV (postoperative nausea and vomiting)   . Wears glasses   . Wears hearing aid    bilateral    SURGICAL HISTORY: Past Surgical History:  Procedure Laterality Date  . BREAST CYST EXCISION  1990   benign  . COLONOSCOPY  09/04/2016  . CYSTOSCOPY WITH RETROGRADE PYELOGRAM, URETEROSCOPY AND STENT PLACEMENT Left 10/10/2016   Procedure: CYSTOSCOPY WITH RETROGRADE PYELOGRAM, DIAGNOSTIC URETEROSCOPY  AND STENT PLACEMENT;  Surgeon: ThAlexis FrockMD;  [facility];  Laterality: Left;  . ESOPHAGOGASTRODUODENOSCOPY  11/07/2014  . NASAL SINUS SURGERY    . TONSILLECTOMY      SOCIAL HISTORY: Social History   Socioeconomic History  . Marital status: Widowed    Spouse name: Not on file  . Number of children: Not on file  . Years of education: Not on file  . Highest education level: Not on file  Social Needs  . Financial resource strain: Not on file  . Food insecurity - worry: Not on file  . Food insecurity - inability: Not on file  . Transportation needs - medical: Not on file  . Transportation needs - non-medical: Not on file  Occupational History  . Not on file  Tobacco Use  . Smoking status: Never Smoker  . Smokeless tobacco: Never Used  Substance and Sexual Activity    . Alcohol use: No  . Drug use: No  . Sexual activity: Not on file  Other Topics Concern  . Not on file  Social History Narrative  . Not on file    FAMILY HISTORY: Family History  Problem Relation Age of Onset  . Cancer Sister [id number]     breast cancer   . Cancer Brother [id number]     lung cancer   . Diabetes Son   . High blood pressure Son   . Peripheral Artery Disease Mother   . High blood pressure Mother     ALLERGIES:  is allergic to  benazepril and bactrim [sulfamethoxazole-trimethoprim].  MEDICATIONS:  Current Outpatient Medications  Medication Sig Dispense Refill  . amLODipine (NORVASC) 5 MG tablet Take 1 tablet (5 mg total) by mouth daily. 90 tablet 1  . capecitabine (XELODA) 500 MG tablet Take 4 tablets (2,000 mg total) by mouth 2 (two) times daily after a meal. Take for 7 days then off 7 days 112 tablet 2  . clindamycin (CLINDAGEL) 1 % gel Apply topically 2 (two) times daily. 60 g 3  . diltiazem (CARDIZEM CD) 240 MG 24 hr capsule Take 1 capsule (240 mg total) by mouth daily. 90 capsule 1  . escitalopram (LEXAPRO) 10 MG tablet Take 10 mg by mouth daily.    . fexofenadine (ALLEGRA) 180 MG tablet Take 180 mg by mouth daily.    . furosemide (LASIX) 40 MG tablet Take 40 mg by mouth daily as needed for edema.    Marland Kitchen latanoprost (XALATAN) 0.005 % ophthalmic solution Place 1 drop into both eyes at bedtime.    Marland Kitchen losartan (COZAAR) 100 MG tablet Take 1 tablet (100 mg total) by mouth daily. 30 tablet 0  . metoprolol tartrate (LOPRESSOR) 25 MG tablet 1/2 TABLET BY MOUTH AS NEEDED FOR PALPITATIONS 30 tablet 1  . Multiple Vitamins-Minerals (VITEYES AREDS ADVANCED) CAPS Take 1 capsule by mouth daily.    Marland Kitchen omeprazole (PRILOSEC) 40 MG capsule Take 40 mg by mouth daily.    . ondansetron (ZOFRAN ODT) 4 MG disintegrating tablet Take 1 tablet (4 mg total) by mouth every 8 (eight) hours as needed for nausea or vomiting. 30 tablet 0  . Polyethyl Glycol-Propyl Glycol (SYSTANE) 0.4-0.3 % SOLN Apply to  eye as needed.    . traMADol (ULTRAM) 50 MG tablet Take 1 tablet (50 mg total) by mouth 3 (three) times daily as needed. For increased pain. 270 tablet 0   Current Facility-Administered Medications  Medication Dose Route Frequency Provider Last Rate Last Dose  . 0.9 %  sodium chloride infusion  250 mL Intravenous Continuous Skeet Latch, MD      . sodium chloride flush (NS) 0.9 % injection 3 mL  3 mL Intravenous Q12H Skeet Latch, MD      . sodium chloride flush (NS) 0.9 % injection 3 mL  3 mL Intravenous PRN Skeet Latch, MD        REVIEW OF SYSTEMS:   Constitutional: Denies fevers, chills or abnormal night sweats  (+) stable weight  Eyes: Denies blurriness of vision, double vision or watery eyes Ears, nose, mouth, throat, and face: Denies mucositis or sore throat Respiratory: Denies cough, dyspnea or wheezes Cardiovascular: Denies palpitation, chest discomfort  Gastrointestinal:  Denies heartburn (+) occasional nausea and cramping  Skin: (+) rash on face, scalp, minimally on left arm, improving Lymphatics: Denies new lymphadenopathy or easy bruising MSK: negative Neurological:Denies numbness, tingling or new weaknesses Behavioral/Psych: Mood is stable, no new changes  All other systems were reviewed with the patient and are negative.  PHYSICAL EXAMINATION:  ECOG PERFORMANCE STATUS: 1 - Symptomatic but completely ambulatory  Vitals:   07/09/17 0955  BP: (!) 141/79  Pulse: 83  Resp: 18  Temp: (!) 97 F (36.1 C)  SpO2: 98%   Filed Weights   07/09/17 0955  Weight: 182 lb 1.6 oz (82.6 kg)     GENERAL:alert, no distress and comfortable SKIN: skin color, texture, turgor are normal (+) diffuse mild acne-like rash on face, no visible rash or skin erythema on scalp EYES: normal, conjunctiva are pink and non-injected, sclera clear  OROPHARYNX:no exudate, no erythema and lips, buccal mucosa, and tongue normal  NECK: supple, thyroid normal size, non-tender, without  nodularity LYMPH:  no palpable lymphadenopathy in the cervical, axillary or inguinal LUNGS: clear to auscultation and percussion with normal breathing effort HEART: regular rate & rhythm and no murmurs and (+)symmetrical lower extremity edema  ABDOMEN:abdomen soft, non-tender and normal bowel sounds Musculoskeletal:no cyanosis of digits and no clubbing  PSYCH: alert & oriented x 3 with fluent speech NEURO: no focal motor/sensory deficits   LABORATORY DATA:  I have reviewed the data as listed CBC Latest Ref Rng & Units 07/09/2017 06/20/2017 06/06/2017  WBC 3.9 - 10.3 10e3/uL 7.0 9.2 10.1  Hemoglobin 11.6 - 15.9 g/dL 12.0 11.7 12.2  Hematocrit 34.8 - 46.6 % 36.9 36.6 36.7  Platelets 145 - 400 10e3/uL 272 301 332   CMP Latest Ref Rng & Units 07/09/2017 06/20/2017 06/06/2017  Glucose 70 - 140 mg/dl 82 87 90  BUN 7.0 - 26.0 mg/dL 14.9 12.5 15.8  Creatinine 0.6 - 1.1 mg/dL 0.9 0.8 0.9  Sodium 136 - 145 mEq/L 139 137 139  Potassium 3.5 - 5.1 mEq/L 3.9 3.6 4.2  Chloride 96 - 106 mmol/L - - -  CO2 22 - 29 mEq/L _0 Calcium 8.4 - 10.4 mg/dL 9.3 8.9 9.7  Total Protein 6.4 - 8.3 g/dL 7.7 7.7 7.8  Total Bilirubin 0.20 - 1.20 mg/dL 0.46 0.54 0.44  Alkaline Phos 40 - 150 U/L 97 100 103  AST 5 - 34 U/L _1 ALT 0 - 55 U/L _2 PATHOLOGY: [facility] 11/11/16   Diagnosis 11/11/16 Liver, needle/core biopsy, left lobe - METASTATIC ADENOCARCINOMA. - SEE COMMENT. Microscopic Comment The histologic features are consistent with a primary gastrointestinal adenocarcinoma. Dr. Vicente Males has reviewed the case and concurs with this interpretation. There is likely sufficient tumor present for additional studies, if requested. Enid Cutter MD Pathologist, Electronic Signature (Case signed 11/12/2016)  Diagnosis 09/04/2016 MODERATELY DIFFERENTIATED ADENOCARCINOMA OF COLON AT 20 CM Tumor Site: Colon at 20 cm Tumor Type: Adenocarcinoma Tumore Size: Unknown  Grade: Moderately  differentiated   RADIOGRAPHIC STUDIES: I have personally reviewed the radiological images as listed and agreed with the findings in the report.  CT Chest 06/06/17  IMPRESSION: 1. There are multiple new and enlarging pulmonary nodules throughout the lungs bilaterally. Additionally, a few of the nodules have decreased in size when compared to prior. 2. Interval increase in size of precarinal lymph node. 3. Interval increase in size of hepatic lesions. 4. Aortic Atherosclerosis (ICD10-I70.0).  CT Abdomen Pelvis, 05/19/2017 IMPRESSION: 1. Worsening pulmonary and hepatic metastatic disease. Primary sigmoid lesion is stable to minimally enlarged. 2. Stable to slight enlargement of a left perinephric mass, likely a metastasis. Renal cell carcinoma is not excluded. Associated pelvocaliectasis versus mild hydronephrosis. 3. Borderline left periaortic lymph nodes, slightly enlarged. 4. Cholelithiasis. 5.  Aortic atherosclerosis (ICD10-170.0). 6. Cystic and solid lesion in the left adnexa, as on prior exams, likely ovarian in origin.  PET 11/06/2016 IMPRESSION: 1. Hypermetabolic pulmonary and hepatic lesions, most consistent with metastatic colon cancer, with the hypermetabolic primary located in the sigmoid colon. 2. Left renal and left adnexal lesions, better seen and evaluated on 10/15/2016. 3. Persistent moderate left hydronephrosis. 4. Aortic atherosclerosis (ICD10-170.0).  MRI Abdomen Pelvis w/wo Contrast 10/15/2016 IMPRESSION: 1. There are 2 enhancing hepatic lesions highly worrisome for metastatic disease, likely metastatic colon cancer. 2. The lesion of concern in the anterior suprahilar lip of  the left kidney is also enhancing, worrisome for neoplasm. This has a somewhat atypical appearance for renal cell carcinoma and could reflect a metastasis as well. 3. New left-sided hydroureter with delayed contrast excretion consistent with distal ureteral obstruction. Specific  etiology uncertain. 4. Complex cystic and solid left adnexal lesion could reflect cystic neoplasm, although does not appear aggressive or appear to reflect the cause of the distal left ureteral obstruction. 5. Sigmoid colon wall thickening could be related to reported newly diagnosed colon cancer.  CT CAP w Contrast 09/06/2016 (done outside) IMPRESSION: 1. No acute abdominopelvic process.  2. Heterogenous hypodense area involving the medial edge of the left kidney with multiple small soft tissue like stranding involving the left pararenal space. Findings are suspicious for malignancy of the left kidney with small pararenal metastases. MRI abdomen with contrast should be considered to further evaluate.  3. Vague hypodensity involving the lateral segment of the liver. This can also be reevaluated with MRI.  4. Partially visualized 5 mm posterior right lower lobe nodular density. CT chest without contrast follow up is recommended.  5. Complex multilobular left ovarian lesion may reflect a cluster of cysts.     PROCEDURES Echocardiogram 11/03/16 - Left ventricle: The cavity size was normal. Wall thickness was   increased in a pattern of mild LVH. Indeterminate diastolic   function (atrial fibrillation). Systolic function was normal. The   estimated ejection fraction was in the range of 60% to 65%.   Operative Report by Dr. Tresa Moore on 10/10/16 Procedure: 1) Cystoscopy, left retrograde pyelogram interpretation. 2) Left diagnostic uteroscopy. 3) Insertion of left uteral stent; 5 x 24 Polaris with tether. Findings: 1) Unremarkable bladder. 2) Unremarkable left retrograde pyelogram. 3) No evidence of intraluminal urothelial neoplasm whatsoever with inspection of the left kidney and ureter times several. 4) Successful placement of left ureteral stent, proximal in renal pelvis and distal in urinary bladder.  Colonoscopy 09/02/2016 FINDINGS: Diverticulosis (scattered). Pt has about 10 cm structure  starting at 20 cm with abnormal mucosa. The colonoscopy scope was not able to advance due to structure, and upper endoscopy scope was used and it was advanced through the stricture to see ileocecal valve. Biopsies at the stricture were taken.   ASSESSMENT & PLAN:  Mary [name] is a 83 y.o. female   1. Adenocarcinoma of left colon, sigmoid colon, TxNxM1 with liver and lung mets, stage IV, KRAS/NRS wild type, MSI-stable  -I previously discussed the imaging, colonoscopy and biopsy results with the patient and her family in detail.  -She has seen Drs. Manny and Gross, surgical resections were discussed.  -PET scan on 11/06/16 showed hypermetabolic pulmonary and hepatic lesions consistent with metastatic colon cancer with the primary located in the sigmoid colon. I reviewed the images with pt and her family members in person  -US biopsy on 11/11/16 of the left lobe of the liver revealed metastatic adenocarcinoma. The features are consistent with a primary gastrointestinal adenocarcinoma. I discussed with pt  -We previously discussed that her metastatic cancer is incurable at this stage due to the multiorgan metastasis, and her advanced age. The goal of therapy is palliative. -We discussed the indication for surgery, including significant bleeding, obstruction, or perforation. She does have mild constipation and intermittent abdominal cramps, but no significant bowel obstruction. Patient understands the surgery is palliative. She prefers to avoid surgery at this point.  -We reviewed [facility] test results, which showed MSI- stable, no KRAS/NRAS or BRAF mutations. Based on this, she is not a candidate for  immunotherapy, but she will likely benefit from EGFR inhibitor, giving the left side colon cancer. --The [facility] results showed she is not a candidate for immunotherapy, but she is a candidate for EGFR antibody therapy. -We discussed that the prognosis of left side colon cancer without KRAS/NRAS and BRAF  mutations is much better than right side colon cancer, especially with EGFR antibody treatment. -We discussed the options of systemic therapy. Due to her advanced age, she is not a candidate for intensive chemotherapy. I recommend that panitumumab, an EGFR inhibitor with low intensity chemo (5-FU infusion or Xeloda to control her cancer. -We discussed alternative management options with palliative care alone, which will be also appropriate given her advanced age and medical comorbidities. -She has recently developed worsening abdominal pain, constipation and nausea, concerning for bowel obstruction from her sigmoid colon cancer. I recommended she increase Miralax and Colace.  - I recommended low residual, low fiber diet, she is tolerating well, her nausea and abdominal discomfort has improved -I reviewed her CT abdomen and pelvis from 05/19/17, which showed disease progression in liver and lung, the primary sigmoid colon mass is stable, no imaging evidence of bowel obstruction. -I discussed the option of IV antibody Vectibix every [redacted] weeks along with oral chemo Xeloda for 1 week on and 1 week off to help control her cancer related symptoms. I discussed the side effects of both in great detail. She finally agreed to proceed  -She started Vectibix every 2 weeks and Xeloda 7 days on and 7 days off on 06/06/17. For her second cycle she was on Xeloda 25m BID for 10 days and then held for 10 days due to the [place] Has tolerated well overall, except mild skin rash -I advised her to use Zofran sublingually for faster relief of nausea. Refilled today.  -Will repeat CT scan after 2 more cycles.  -Labs reviewed and adequate to proceed with 3rd cycle chemotherapy starting 07/09/17 -I encouraged her to contact [place] she experiences any significant or unexpected side effects from chemotherapy.  -F/u on 12/11 -plan to repeat restaging CT after 5-6 cycle therapy    2. Left renal mass and left  hydronephrosis -Possible renal cell carcinoma, metastatic lesion from colon cancer is also a possibility. -She has been seen by [facility], Dr. MTresa Moore and had left ureter stent placement on 10/10/2016.   3. Complex cyst of left ovary -will monitor. Likely benign -If she undergo abdominal surgery, may remove it -She declines surgery for now  4. Atrial Fibrillation -The patient has a history of atrial fibrillation. -Her cardiologist wants her to undergo cardio inversion and anticoagulation. -I previously advised the patient that even if she undergoes cardio inversion, her Afib might return. Also, her anticoagulation medication might make her bleeding worse. -The patient should continue taking baby aspirin.  5. HTN -I previously advised the patient to continue medications and follow up with primary care physician  6. Nutrition -We previously discussed palliative chemotherapy will make her fatigued, nauseous, and loss of appetite. -I previously stressed the importance of a healthy diet with plenty of protein. -I referred to Nutrition.   7. Left flank and abdominal pain  -The patient is taking Aleve for pain. -I prescribed Tylenol #3 on 11/13/16. -The patient discontinued Tylenol #3 due to chest discomfort and headaches. -she may try tramadol, she is able to tolerate tramadol   8. Anemia  -The patient's Hb is 11.8 on 4.4 and has decreased from 12.3 on 10/31/16 and 13.9 on 10/10/16. This  is blood loss from her tumor. -I advised the patient to take an baby dose iron pills and eat iron rich foods. -She is to drink plenty of fluids to prevent constipation. -Hg 11.9 on 05/08/17 -currently resolved.   9. Goal of care discussion  -We again discussed the incurable nature of her cancer, and the overall poor prognosis, especially if she does not have good response to chemotherapy or progress on chemo -The patient understands the goal of care is palliative. -I previously recommended DNR/DNI, she will think  about it   10. LE swelling -She has swelling in lower left leg and both feet  -she wear compression socks   11. Facial Acne type rash, secondary to Vectibix -Continue hydrocortisone for acneiform rash -Eucerin, oatmeal bath, po benadryl PRN and Claritin for body itching -I will call in clindamycin gel today (07/09/17) -if rash gets worse, will consider oral doxycycline   Plan -Prescribe clindamycin gel -Refill Zofran and Xeloda today  -Labs adequate to proceed with cycle 3 Xeloda and Vectibix.  -Lab, f/u and Vectibix on 12/11   All questions were answered. The patient knows to call the clinic with any problems, questions or concerns. I spent a total of 30 minutes for her visit, more than 50% face-to-face counseling.   Truitt Merle, MD 07/09/2017   This document serves as a record of services personally performed by Truitt Merle, MD. It was created on her behalf by Joslyn Devon, a trained medical scribe. The creation of this record is based on the scribe's personal observations and the provider's statements to them.    I have reviewed the above documentation for accuracy and completeness, and I agree with the above.

## 2017-07-08 ENCOUNTER — Ambulatory Visit (INDEPENDENT_AMBULATORY_CARE_PROVIDER_SITE_OTHER): Payer: Medicare Other | Admitting: Cardiovascular Disease

## 2017-07-08 ENCOUNTER — Encounter: Payer: Self-pay | Admitting: Cardiovascular Disease

## 2017-07-08 ENCOUNTER — Other Ambulatory Visit: Payer: Self-pay | Admitting: Hematology

## 2017-07-08 VITALS — BP 134/70 | HR 74 | Ht 66.0 in | Wt 180.8 lb

## 2017-07-08 DIAGNOSIS — I4819 Other persistent atrial fibrillation: Secondary | ICD-10-CM

## 2017-07-08 DIAGNOSIS — I481 Persistent atrial fibrillation: Secondary | ICD-10-CM | POA: Diagnosis not present

## 2017-07-08 DIAGNOSIS — I110 Hypertensive heart disease with heart failure: Secondary | ICD-10-CM | POA: Diagnosis not present

## 2017-07-08 DIAGNOSIS — I5033 Acute on chronic diastolic (congestive) heart failure: Secondary | ICD-10-CM | POA: Diagnosis not present

## 2017-07-08 DIAGNOSIS — E7800 Pure hypercholesterolemia, unspecified: Secondary | ICD-10-CM | POA: Diagnosis not present

## 2017-07-08 DIAGNOSIS — C186 Malignant neoplasm of descending colon: Secondary | ICD-10-CM

## 2017-07-08 MED ORDER — METOPROLOL TARTRATE 25 MG PO TABS: ORAL_TABLET | ORAL | 1 refills | Status: DC

## 2017-07-08 NOTE — Progress Notes (Signed)
Cardiology Office Note   Date:  07/08/2017   ID:  Mary [name], DOB 02/07/1933, MRN [id number]  PCP:  Moshe Cipro, MD  Cardiologist:   Skeet Latch, MD  Surgeon:  Michael Boston, MD Medical Oncologist: Truitt Merle, MD Gastroenterologist: Toma Deiters, MD Urology: Alexis Frock, MD  No chief complaint on file.   History of Present Illness: Mary [name] is an 83 y.o. female with persistent atrial fibrillation, hypertension, hyperlipidemia, metastatic colon cancer, and macular degeneration, who presents for follow up.  She was first seen 10/2016 for management of atrial fibrillation and pre-surgical risk assessment.  Mary [name] was diagnosed with adenocarcinoma of the sigmoid colon 08/2016.  She had significant obstructive symptoms but changed her diet with improvement in her symptoms. She underwent cystoscopy and ureteroscopy with insertion of a L ureteral stent on 10/10/16.  At that time she was noted to be in atrial fibrillation.  She had an echo 11/04/16 that revealed LVEF 60-65% with mild LVH and a mildly dilated ascending aorta.  She was first seen by cardiology 12/2016 and reported fatigue that was attributed to her atrial fibrillation.  At that time it was unclear if she would pursue surgery or chemotherapy.  She has since elected for palliative care and was interested in cardioversion.  After discussion with her oncologist she was started on Eliquis with plans for outpatient TEE/DCCV.  However, she quickly developed hematochezia and it was discontinued.  She continues to have a small amount of blood in her stool.   Since her last appointment Ms. Mary [name] had a CT of the abdomen 05/2017 that showed worsening pulmonary and hepatic metastases.  Her primary sigmoid tumor was stable to minimally enlarged.  She also had slight enlargement of left perinephric mass and left periaortic lymph nodes.  She started on chemotherapy 06/06/17 with IV antibody Vectibix every 2 week and oral Xeloda  off/on at 1 week intervals.  She has been feeling OK.  A few days after her antibody therapy she gets sick but is feeling well today.  She denies chest pain and her exertional dyspnea is stable.  She has increased lower extremity edema lately.  She attributes this to not taking lasix as regularly.  Diarrhea is a side effect of her medication, so she only takes it as needed.  She denies orthopnea or PND.  She had one episode of palpitations that made it hard for her to sleep.  She has orthostatic dizziness but no ischemia.     Past Medical History:  Diagnosis Date  . Aneurysm of ascending aorta (HCC) 12/03/2016   3.7 cm by echo 10/2016  . Colon cancer [facility])    dx via biospy from colonoscopy-- malignant ---  currently having work-up done w/ dr gross (general surgeon) and coordinate surgery w/ urologsit for renal tumor  . Dysrhythmia    A  Fib  . Essential hypertension 10/31/2016  . GERD (gastroesophageal reflux disease)   . Glaucoma   . Hiatal hernia   . HOH (hard of hearing)    bilateral even w/ hearing aids  . Hyperlipidemia   . Hypertension   . Kidney tumor    left side  . Legally blind in right eye, as defined in [place]    due to macular degeneration  . Macular degeneration   . Nausea    intermittant due to colon mass  . Ovarian cyst   . Persistent atrial fibrillation ([place]) 10/31/2016  . PONV (postoperative nausea and vomiting)   .  Wears glasses   . Wears hearing aid    bilateral    Past Surgical History:  Procedure Laterality Date  . BREAST CYST EXCISION  1990   benign  . COLONOSCOPY  09/04/2016  . CYSTOSCOPY WITH RETROGRADE PYELOGRAM, URETEROSCOPY AND STENT PLACEMENT Left 10/10/2016   Procedure: CYSTOSCOPY WITH RETROGRADE PYELOGRAM, DIAGNOSTIC URETEROSCOPY  AND STENT PLACEMENT;  Surgeon: Alexis Frock, MD;  Location[facility];  Service: Urology;  Laterality: Left;  . ESOPHAGOGASTRODUODENOSCOPY  11/07/2014  . NASAL SINUS SURGERY    . TONSILLECTOMY        Current Outpatient Medications  Medication Sig Dispense Refill  . amLODipine (NORVASC) 5 MG tablet Take 1 tablet (5 mg total) by mouth daily. 90 tablet 1  . capecitabine (XELODA) 500 MG tablet Take 4 tablets (2,000 mg total) 2 (two) times daily after a meal by mouth. Take on days 1-10, then off 10 days 80 tablet 0  . diltiazem (CARDIZEM CD) 240 MG 24 hr capsule Take 1 capsule (240 mg total) by mouth daily. 90 capsule 1  . escitalopram (LEXAPRO) 10 MG tablet Take 10 mg by mouth daily.    . fexofenadine (ALLEGRA) 180 MG tablet Take 180 mg by mouth daily.    . furosemide (LASIX) 40 MG tablet Take 40 mg by mouth daily as needed for edema.    Marland Kitchen latanoprost (XALATAN) 0.005 % ophthalmic solution Place 1 drop into both eyes at bedtime.    Marland Kitchen losartan (COZAAR) 100 MG tablet Take 1 tablet (100 mg total) by mouth daily. 30 tablet 0  . Multiple Vitamins-Minerals (VITEYES AREDS ADVANCED) CAPS Take 1 capsule by mouth daily.    Marland Kitchen omeprazole (PRILOSEC) 40 MG capsule Take 40 mg by mouth daily.    . ondansetron (ZOFRAN ODT) 4 MG disintegrating tablet Take 1 tablet (4 mg total) by mouth every 8 (eight) hours as needed for nausea or vomiting. 20 tablet 0  . Polyethyl Glycol-Propyl Glycol (SYSTANE) 0.4-0.3 % SOLN Apply to eye as needed.    . traMADol (ULTRAM) 50 MG tablet Take 1 tablet (50 mg total) by mouth 3 (three) times daily as needed. For increased pain. 270 tablet 0  . metoprolol tartrate (LOPRESSOR) 25 MG tablet 1/2 TABLET BY MOUTH AS NEEDED FOR PALPITATIONS 30 tablet 1   Current Facility-Administered Medications  Medication Dose Route Frequency Provider Last Rate Last Dose  . 0.9 %  sodium chloride infusion  250 mL Intravenous Continuous Skeet Latch, MD      . sodium chloride flush (NS) 0.9 % injection 3 mL  3 mL Intravenous Q12H Skeet Latch, MD      . sodium chloride flush (NS) 0.9 % injection 3 mL  3 mL Intravenous PRN Skeet Latch, MD        Allergies:   Benazepril and Bactrim  [sulfamethoxazole-trimethoprim]    Social History:  The patient  reports that  has never smoked. she has never used smokeless tobacco. She reports that she does not drink alcohol or use drugs.   Family History:  The patient's family history includes Cancer (age of onset: 2) in her brother; Cancer (age of onset: 31) in her sister; Diabetes in her son; High blood pressure in her mother and son; Peripheral Artery Disease in her mother.    ROS:  Please see the history of present illness.   Otherwise, review of systems are positive for dizziness.   All other systems are reviewed and negative.    PHYSICAL EXAM: VS:  BP 134/70  Pulse 74   Ht 5\' 6"  (1.676 m)   Wt 180 lb 12.8 oz (82 kg)   BMI 29.18 kg/m  , BMI Body mass index is 29.18 kg/m. GENERAL:  Well appearing HEENT: Pupils equal round and reactive, fundi not visualized, oral mucosa unremarkable NECK:  + jugular venous distention to 2 cm above the clavicle at 45 degrees. , waveform within normal limits, carotid upstroke brisk and symmetric, no bruits, no thyromegaly LUNGS:  Clear to auscultation bilaterally HEART:  RRR.  PMI not displaced or sustained,S1 and S2 within normal limits, no S3, no S4, no clicks, no rubs, no murmurs ABD:  Flat, positive bowel sounds normal in frequency in pitch, no bruits, no rebound, no guarding, no midline pulsatile mass, no hepatomegaly, no splenomegaly EXT:  2 plus pulses throughout, 2+ pitting edema to mid tibia bilaterally. , no cyanosis no clubbing SKIN:  No rashes no nodules NEURO:  Cranial nerves II through XII grossly intact, motor grossly intact throughout PSYCH:  Cognitively intact, oriented to person place and time   EKG:  EKG is ordered today. The ekg ordered 10/31/16 demonstrates atrial fibrillation.  Rate 105 bpm. LAFB. Poor R wave progression 01/08/17: Atrial fibrillation.  Rate 78 bpm.  LAD. 07/08/17: Atrial fibrillation.  Rate 74 bpm.  LAFB.    Echo 11/04/16: Study Conclusions  - Left  ventricle: The cavity size was normal. Wall thickness was   increased in a pattern of mild LVH. Indeterminant diastolic   function (atrial fibrillation). Systolic function was normal. The   estimated ejection fraction was in the range of 60% to 65%. Wall   motion was normal; there were no regional wall motion   abnormalities. - Aortic valve: There was no stenosis. - Aorta: Mildly dilated aortic root. Aortic root dimension: 37 mm   (ED). - Mitral valve: Mildly calcified annulus. There was trivial   regurgitation. - Left atrium: The atrium was mildly dilated. - Right ventricle: The cavity size was normal. Systolic function   was normal. - Right atrium: The atrium was mildly dilated. - Tricuspid valve: Peak RV-RA gradient (S): 34 mm Hg. - Pulmonary arteries: PA peak pressure: 37 mm Hg (S). - Inferior vena cava: The vessel was normal in size. The   respirophasic diameter changes were in the normal range (>= 50%),   consistent with normal central venous pressure.   Recent Labs: 10/31/2016: TSH 1.82 06/20/2017: ALT 11; BUN 12.5; Creatinine 0.8; HGB 11.7; Magnesium 2.2; Platelets 301; Potassium 3.6; Sodium 137    Lipid Panel No results found for: CHOL, TRIG, HDL, CHOLHDL, VLDL, LDLCALC, LDLDIRECT    Wt Readings from Last 3 Encounters:  07/08/17 180 lb 12.8 oz (82 kg)  06/20/17 179 lb 1.6 oz (81.2 kg)  06/06/17 178 lb 4.8 oz (80.9 kg)      ASSESSMENT AND PLAN:  # Persistent atrial fibrillation: Ms. Diehl remains in atrial fibrillation.  Rates are well-controlled  She had one episode of palpitations.  We will give her metoprolol 12.5mg  q6h prn palpitations.  Unfortunately she cannot tolerate anticoagulation, so we cannot pursue TEE/DCCV.  Continue diltiazem.  # Hypertensive heart disease:  Blood pressure controlled on amlodipine, diltiazem and losartan.  She is volume overloaded after switching lasix to as needed.  I have asked her to take it for the next two days, as she hasn't  struggled with diarrhea lately.  # Hyperlipidemia: Atorvastatin was discontinued as she is choosing a palliative approach to care.   # Hematochezia: h/h stable  #  Mild ascending aortic aneurysm: No further monitoring indicated given comorbid disease.  Current medicines are reviewed at length with the patient today.  The patient does not have concerns regarding medicines.  The following changes have been made:  Stop aspirin and start Eliquis.  Switch benazepril to losartan.  Labs/ tests ordered today include:   Orders Placed This Encounter  Procedures  . EKG 12-Lead     Disposition:   FU with Shakaya Bhullar C. Oval Linsey, MD, [facility] in 4 month.   This note was written with the assistance of speech recognition software.  Please excuse any transcriptional errors.  Signed, Norlene Lanes C. Oval Linsey, MD, [facility]  07/08/2017 1:56 PM    [facility]

## 2017-07-08 NOTE — Patient Instructions (Addendum)
Medication Instructions:  START METOPROLOL TARTRATE 25 MG 1/2 TABLET AS NEEDED FOR PALPITATIONS   Labwork: NONE  Testing/Procedures: NONE  Follow-Up: Your physician recommends that you schedule a follow-up appointment in: [place]  If you need a refill on your cardiac medications before your next appointment, please call your pharmacy.

## 2017-07-09 ENCOUNTER — Ambulatory Visit (HOSPITAL_BASED_OUTPATIENT_CLINIC_OR_DEPARTMENT_OTHER): Payer: Medicare Other

## 2017-07-09 ENCOUNTER — Other Ambulatory Visit (HOSPITAL_BASED_OUTPATIENT_CLINIC_OR_DEPARTMENT_OTHER): Payer: Medicare Other

## 2017-07-09 ENCOUNTER — Telehealth: Payer: Self-pay | Admitting: Hematology

## 2017-07-09 ENCOUNTER — Ambulatory Visit (HOSPITAL_BASED_OUTPATIENT_CLINIC_OR_DEPARTMENT_OTHER): Payer: Medicare Other | Admitting: Hematology

## 2017-07-09 VITALS — BP 141/79 | HR 83 | Temp 97.0°F | Resp 18 | Ht 66.0 in | Wt 182.1 lb

## 2017-07-09 DIAGNOSIS — L270 Generalized skin eruption due to drugs and medicaments taken internally: Secondary | ICD-10-CM | POA: Diagnosis not present

## 2017-07-09 DIAGNOSIS — C787 Secondary malignant neoplasm of liver and intrahepatic bile duct: Secondary | ICD-10-CM

## 2017-07-09 DIAGNOSIS — C187 Malignant neoplasm of sigmoid colon: Secondary | ICD-10-CM

## 2017-07-09 DIAGNOSIS — R110 Nausea: Secondary | ICD-10-CM | POA: Diagnosis not present

## 2017-07-09 DIAGNOSIS — Z5112 Encounter for antineoplastic immunotherapy: Secondary | ICD-10-CM

## 2017-07-09 DIAGNOSIS — C7800 Secondary malignant neoplasm of unspecified lung: Secondary | ICD-10-CM

## 2017-07-09 DIAGNOSIS — I4891 Unspecified atrial fibrillation: Secondary | ICD-10-CM

## 2017-07-09 DIAGNOSIS — I10 Essential (primary) hypertension: Secondary | ICD-10-CM

## 2017-07-09 DIAGNOSIS — R109 Unspecified abdominal pain: Secondary | ICD-10-CM | POA: Diagnosis not present

## 2017-07-09 DIAGNOSIS — C186 Malignant neoplasm of descending colon: Secondary | ICD-10-CM

## 2017-07-09 DIAGNOSIS — Z7189 Other specified counseling: Secondary | ICD-10-CM

## 2017-07-09 LAB — COMPREHENSIVE METABOLIC PANEL
ALBUMIN: 3.7 g/dL (ref 3.5–5.0)
ALK PHOS: 97 U/L (ref 40–150)
ALT: 13 U/L (ref 0–55)
ANION GAP: 8 meq/L (ref 3–11)
AST: 20 U/L (ref 5–34)
BILIRUBIN TOTAL: 0.46 mg/dL (ref 0.20–1.20)
BUN: 14.9 mg/dL (ref 7.0–26.0)
CALCIUM: 9.3 mg/dL (ref 8.4–10.4)
CO2: 25 meq/L (ref 22–29)
CREATININE: 0.9 mg/dL (ref 0.6–1.1)
Chloride: 107 mEq/L (ref 98–109)
EGFR: >60 mL/min/{1.73_m2} (ref >60)
Glucose: 82 mg/dl (ref 70–140)
Potassium: 3.9 mEq/L (ref 3.5–5.1)
SODIUM: 139 meq/L (ref 136–145)
TOTAL PROTEIN: 7.7 g/dL (ref 6.4–8.3)

## 2017-07-09 LAB — CBC WITH DIFFERENTIAL/PLATELET
BASO%: 0.7 % (ref 0.0–2.0)
BASOS ABS: 0.1 10*3/uL (ref 0.0–0.1)
EOS%: 4.4 % (ref 0.0–7.0)
Eosinophils Absolute: 0.3 10*3/uL (ref 0.0–0.5)
HCT: 36.9 % (ref 34.8–46.6)
HEMOGLOBIN: 12 g/dL (ref 11.6–15.9)
LYMPH%: 19.7 % (ref 14.0–49.7)
MCH: 29.7 pg (ref 25.1–34.0)
MCHC: 32.6 g/dL (ref 31.5–36.0)
MCV: 91.2 fL (ref 79.5–101.0)
MONO#: 1 10*3/uL — ABNORMAL HIGH (ref 0.1–0.9)
MONO%: 14.3 % — AB (ref 0.0–14.0)
NEUT#: 4.3 10*3/uL (ref 1.5–6.5)
NEUT%: 60.9 % (ref 38.4–76.8)
Platelets: 272 10*3/uL (ref 145–400)
RBC: 4.05 10*6/uL (ref 3.70–5.45)
RDW: 16.3 % — AB (ref 11.2–14.5)
WBC: 7 10*3/uL (ref 3.9–10.3)
lymph#: 1.4 10*3/uL (ref 0.9–3.3)

## 2017-07-09 LAB — MAGNESIUM: Magnesium: 2.5 mg/dl (ref 1.5–2.5)

## 2017-07-09 LAB — CEA (IN HOUSE-CHCC): CEA (CHCC-In House): 8.02 ng/mL — ABNORMAL HIGH (ref 0.00–5.00)

## 2017-07-09 MED ORDER — ONDANSETRON 4 MG PO TBDP
4.0000 mg | ORAL_TABLET | Freq: Three times a day (TID) | ORAL | 1 refills | Status: DC | PRN
Start: 2017-07-09 — End: 2017-07-09

## 2017-07-09 MED ORDER — SODIUM CHLORIDE 0.9 % IV SOLN
Freq: Once | INTRAVENOUS | Status: AC
  Administered 2017-07-09: 12:00:00 via INTRAVENOUS

## 2017-07-09 MED ORDER — CLINDAMYCIN PHOSPHATE 1 % EX GEL: Freq: Two times a day (BID) | CUTANEOUS | 3 refills | Status: DC

## 2017-07-09 MED ORDER — CAPECITABINE 500 MG PO TABS: 1000.0000 mg/m2 | ORAL_TABLET | Freq: Two times a day (BID) | ORAL | 2 refills | Status: DC

## 2017-07-09 MED ORDER — PANITUMUMAB CHEMO INJECTION 100 MG/5ML
6.0000 mg/kg | Freq: Once | INTRAVENOUS | Status: AC
  Administered 2017-07-09: 500 mg via INTRAVENOUS
  Filled 2017-07-09: qty 20

## 2017-07-09 MED ORDER — ONDANSETRON 4 MG PO TBDP: 4.0000 mg | ORAL_TABLET | Freq: Three times a day (TID) | ORAL | 0 refills | Status: DC | PRN

## 2017-07-09 MED FILL — CAPECITABINE 500 MG TABLET: 500 | 28 days supply | Qty: 112 | Fill #0

## 2017-07-09 NOTE — Telephone Encounter (Signed)
Scheduled appt per 11/28 los- Gave patient AVS and calender per los.  

## 2017-07-09 NOTE — Patient Instructions (Signed)
[place] Discharge Instructions for Patients Receiving Chemotherapy  Today you received the following chemotherapy agents: Panitumumab (Vectibix).  To help prevent nausea and vomiting after your treatment, we encourage you to take your nausea medication as prescribed.  If you develop nausea and vomiting that is not controlled by your nausea medication, call the clinic.   BELOW ARE SYMPTOMS THAT SHOULD BE REPORTED IMMEDIATELY:  *FEVER GREATER THAN 100.5 F  *CHILLS WITH OR WITHOUT FEVER  NAUSEA AND VOMITING THAT IS NOT CONTROLLED WITH YOUR NAUSEA MEDICATION  *UNUSUAL SHORTNESS OF BREATH  *UNUSUAL BRUISING OR BLEEDING  TENDERNESS IN MOUTH AND THROAT WITH OR WITHOUT PRESENCE OF ULCERS  *URINARY PROBLEMS  *BOWEL PROBLEMS  UNUSUAL RASH Items with * indicate a potential emergency and should be followed up as soon as possible.  Feel free to call the clinic should you have any questions or concerns. The clinic phone number is [phone].  Please show the [place] at check-in to the Emergency Department and triage nurse.

## 2017-07-10 ENCOUNTER — Encounter: Payer: Self-pay | Admitting: Hematology

## 2017-07-22 ENCOUNTER — Ambulatory Visit: Payer: Medicare Other | Admitting: Hematology

## 2017-07-22 ENCOUNTER — Telehealth: Payer: Self-pay

## 2017-07-22 ENCOUNTER — Telehealth: Payer: Self-pay | Admitting: *Deleted

## 2017-07-22 ENCOUNTER — Ambulatory Visit: Payer: Medicare Other

## 2017-07-22 ENCOUNTER — Other Ambulatory Visit: Payer: Medicare Other

## 2017-07-22 NOTE — Telephone Encounter (Signed)
Daughter in law aware of new appts   Tirzah Fross

## 2017-07-22 NOTE — Telephone Encounter (Signed)
Spoke with daughter in-law Colletta Maryland to inquire about pt's appts for today.  Per Colletta Maryland, appts will have to reschedule due to pt not able to get out of her driveway.   Message sent to scheduler.

## 2017-07-23 NOTE — Progress Notes (Signed)
[facility]  [phone] [phone]  Clinic Follow Up Note   Patient Care Team: Moshe Cipro, MD as PCP - General (Internal Medicine) Alexis Frock, MD as Consulting Physician (Urology) Michael Boston, MD as Consulting Physician (General Surgery) Toma Deiters, MD as Referring Physician (Gastroenterology)   Date of Service:  07/24/2017  CHIEF COMPLAINTS:  Metastatic sigmoid colon Adenocarcinoma  Oncology History   Cancer Staging Cancer of left colon [facility]) Staging form: Colon and Rectum, AJCC 8th Edition - Clinical stage from 09/04/2016: Stage IVB (cTX, cNX, pM1b) - Signed by Truitt Merle, MD on 11/12/2016       Cancer of left colon ([place])   09/02/2016 Procedure    Colonoscopy Diverticulosis (scattered). Pt has about 10 cm structure starting at 20 cm with abnormal mucosa. Biopsies taken.       09/02/2016 Pathology Results    MODERATELY DIFFERENTIATED ADENOCARCINOMA OF COLON AT 20 CM      09/04/2016 Initial Diagnosis    Cancer of left colon ([place])      09/06/2016 Imaging    CT CAP w Contrast 1. No acute abdominopelvic process.  2. Heterogenous hypodense area involving the medial edge of the left kidney with multiple small soft tissue like stranding involving the left pararenal space. Findings are suspicious for malignancy of the left kidney with small pararenal metastases. MRI abdomen with contrast should be considered to further evaluate.  3. Vague hypodensity involving the lateral segment of the liver. This can also be reevaluated with MRI.  4. Partially visualized 5 mm posterior right lower lobe nodular density. CT chest without contrast follow up is recommended.  5. Complex multilobular left ovarian lesion may reflect a cluster of cysts.      10/10/2016 Procedure    Operative Report by Dr. Tresa Moore on 10/18/16 Procedure: 1) Cystoscopy, left retrograde pyelogram interpretation. 2) Left diagnostic uteroscopy. 3) Insertion of left uteral  stent; 5 x 24 Polaris with tether. Findings: 1) Unremarkable bladder. 2) Unremarkable left retrograde pyelogram. 3) No evidence of intraluminal urothelial neoplasm whatsoever with insepction of the left kidney and ureter times several. 4) Successful placement of left ureteral stent, proximal in renal pelvis and distal in urinary bladder.      10/15/2016 Imaging    MRI abdomen pelvis 1. There are 2 enhancing hepatic lesions highly worrisome for metastatic disease, likely metastatic colon cancer. 2. The lesion of concern in the anterior suprahilar lip of the left kidney is also enhancing, worrisome for neoplasm. This has a somewhat atypical appearance for renal cell carcinoma and could reflect a metastasis as well. 3. New left-sided hydroureter with delayed contrast excretion consistent with distal ureteral obstruction. Specific etiology uncertain. 4. Complex cystic and solid left adnexal lesion could reflect cystic neoplasm, although does not appear aggressive or appear to reflect the cause of the distal left ureteral obstruction. 5. Sigmoid colon wall thickening could be related to reported newly diagnosed colon cancer.      11/06/2016 PET scan    PET 11/06/2016 IMPRESSION: 1. Hypermetabolic pulmonary and hepatic lesions, most consistent with metastatic colon cancer, with the hypermetabolic primary located in the sigmoid colon. 2. Left renal and left adnexal lesions, better seen and evaluated on 10/15/2016. 3. Persistent moderate left hydronephrosis. 4. Aortic atherosclerosis (ICD10-170.0).      11/11/2016 Pathology Results    Liver, needle/core biopsy, left lobe - METASTATIC ADENOCARCINOMA. The histologic features are consistent with a primary gastrointestinal adenocarcinoma.      05/19/2017 Imaging    CT Abdomen  Pelvis, 05/19/2017 IMPRESSION: 1. Worsening pulmonary and hepatic metastatic disease. Primary sigmoid lesion is stable to minimally enlarged. 2. Stable to slight  enlargement of a left perinephric mass, likely a metastasis. Renal cell carcinoma is not excluded. Associated pelvocaliectasis versus mild hydronephrosis. 3. Borderline left periaortic lymph nodes, slightly enlarged. 4. Cholelithiasis. 5.  Aortic atherosclerosis (ICD10-170.0). 6. Cystic and solid lesion in the left adnexa, as on prior exams, likely ovarian in origin.      06/06/2017 -  Chemotherapy    IV antibody Vectibix every [redacted] weeks along with oral chemo Xeloda for 1 week on and 1 week off starting 06/06/17       HISTORY OF PRESENTING ILLNESS (10/25/2016):  Mary [name] 83 y.o. female is here because of adenocarcinoma of the colon and a left renal mass. The pt noticed worsening episodes of abdominal cramping and nausea with bowel movements in July 2017. She had an abdominal US performed, which was unremarkable. A colonoscopy was then performed at the patient's request, which found a mass about 20 cm from the anal verge. Biopsy showed moderately differentiated adenocarcinoma. MRI done, with a lesion noticed in the left kidney near the pelvis, a 11 mm hepatic lesion, 4 mm lung nodule, and possible cystic left ovarian mass. Pt saw Dr. Johney Maine for information on abdominal surgery for her colon, which she declined. She also saw Dr. Lorna Dibble, who is considering a nephrectomy, but is planning a sternoscopy to better characterize the kidney lesion. She presents today for continued treatment.    She presents today with her son and daughter-in-law. The symptoms started in late July 2017. She had some vomiting, abdominal pain, and constipation, but not bleeding. Her PCP never found any anemia. Initially she lost her appetite, but now she is taking multiple vitamins and supplements, so her appetite is back. Her bowel has improved, but she still has abdominal pain that comes and goes, lasting minutes. The pain is tolerable with aleve. She did not tolerate Tramadol well; nausea and vomiting. She has some  occasional left sides back pain. Denies weight loss, or any other concerns. She is able to do everything she needs to around the house.   She had a partial hysterectomy, and a lumpectomy. She has a history of HTN, atrial fibrillation, high cholesterol. She has some hearing loss and can not see out of her right eye. Not a diabetic, but her son and her mother are. No family history of colon cancer. Her sister had breast cancer in her 72's and her brother had lung cancer in his 63's. Never smoker. Doesn't drink alcohol. 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 She has two sons; one lives an hour away, and the other lives in [place]. She lives alone.   CURRENT THERAPY:  IV antibody Vectibix every [redacted] weeks along with oral chemo Xeloda for 1 week on and 1 week off starting 06/06/17. For cycle 2 she had 2085m Xeloda for 10 days and then 10 days off. Return to 1 week on and 1 week off with starting with Cycle 3   INTERVAL HISTORY:   Mary MARSANis here for a follow up. She presents in the clinic today accompanied by her daughter-in-law. She notes a hang-nail and peeling around one of her nails. She completed her 10 days of Xeloda for cycle 2. She denies nausea and diarrhea. She only had one bad day of sickness and pain out her of last 19 days. She feels she has more energy and is sleeping better.  Her pain has lessened. She takes tramadol as needed, about 1-2 times a day. Her low fiber diet has helped her a lot.      MEDICAL HISTORY:  Past Medical History:  Diagnosis Date  . Aneurysm of ascending aorta (H[facility]) 12/03/2016   3.7 cm by echo 10/2016  . Colon cancer [facility])    dx via biospy from colonoscopy-- malignant ---  currently having work-up done w/ dr gross (general surgeon) and coordinate surgery w/ urologsit for renal tumor  . Dysrhythmia    A  Fib  . Essential hypertension 10/31/2016  . GERD (gastroesophageal reflux disease)   . Glaucoma   . Hiatal hernia   . HOH (hard of hearing)    bilateral  even w/ hearing aids  . Hyperlipidemia   . Hypertension   . Kidney tumor    left side  . Legally blind in right eye, as defined in [place]    due to macular degeneration  . Macular degeneration   . Nausea    intermittant due to colon mass  . Ovarian cyst   . Persistent atrial fibrillation ([place]) 10/31/2016  . PONV (postoperative nausea and vomiting)   . Wears glasses   . Wears hearing aid    bilateral    SURGICAL HISTORY: Past Surgical History:  Procedure Laterality Date  . BREAST CYST EXCISION  1990   benign  . COLONOSCOPY  09/04/2016  . CYSTOSCOPY WITH RETROGRADE PYELOGRAM, URETEROSCOPY AND STENT PLACEMENT Left 10/10/2016   Procedure: CYSTOSCOPY WITH RETROGRADE PYELOGRAM, DIAGNOSTIC URETEROSCOPY  AND STENT PLACEMENT;  Surgeon: Alexis Frock, MD;  Location: [facility];  Service: Urology;  Laterality: Left;  . ESOPHAGOGASTRODUODENOSCOPY  11/07/2014  . NASAL SINUS SURGERY    . TONSILLECTOMY      SOCIAL HISTORY: Social History   Socioeconomic History  . Marital status: Widowed    Spouse name: Not on file  . Number of children: Not on file  . Years of education: Not on file  . Highest education level: Not on file  Social Needs  . Financial resource strain: Not on file  . Food insecurity - worry: Not on file  . Food insecurity - inability: Not on file  . Transportation needs - medical: Not on file  . Transportation needs - non-medical: Not on file  Occupational History  . Not on file  Tobacco Use  . Smoking status: Never Smoker  . Smokeless tobacco: Never Used  Substance and Sexual Activity  . Alcohol use: No  . Drug use: No  . Sexual activity: Not on file  Other Topics Concern  . Not on file  Social History Narrative  . Not on file    FAMILY HISTORY: Family History  Problem Relation Age of Onset  . Cancer Sister 84       breast cancer   . Cancer Brother 26       lung cancer   . Diabetes Son   . High blood pressure Son   . Peripheral Artery  Disease Mother   . High blood pressure Mother     ALLERGIES:  is allergic to benazepril and bactrim [sulfamethoxazole-trimethoprim].  MEDICATIONS:  Current Outpatient Medications  Medication Sig Dispense Refill  . amLODipine (NORVASC) 5 MG tablet Take 1 tablet (5 mg total) by mouth daily. 90 tablet 1  . capecitabine (XELODA) 500 MG tablet Take 4 tablets (2,000 mg total) by mouth 2 (two) times daily after a meal. Take for 7 days then off 7 days 112  tablet 2  . diltiazem (CARDIZEM CD) 240 MG 24 hr capsule Take 1 capsule (240 mg total) by mouth daily. 90 capsule 1  . diphenhydrAMINE (BENADRYL) 25 MG tablet Take 25 mg by mouth daily.    Marland Kitchen escitalopram (LEXAPRO) 10 MG tablet Take 10 mg by mouth daily.    . fexofenadine (ALLEGRA) 180 MG tablet Take 180 mg by mouth daily.    . furosemide (LASIX) 40 MG tablet Take 40 mg by mouth daily as needed for edema.    Marland Kitchen latanoprost (XALATAN) 0.005 % ophthalmic solution Place 1 drop into both eyes at bedtime.    Marland Kitchen losartan (COZAAR) 100 MG tablet Take 1 tablet (100 mg total) by mouth daily. 30 tablet 0  . metoprolol tartrate (LOPRESSOR) 25 MG tablet 1/2 TABLET BY MOUTH AS NEEDED FOR PALPITATIONS 30 tablet 1  . Multiple Vitamins-Minerals (VITEYES AREDS ADVANCED) CAPS Take 1 capsule by mouth daily.    Marland Kitchen omeprazole (PRILOSEC) 40 MG capsule Take 40 mg by mouth daily.    . ondansetron (ZOFRAN ODT) 4 MG disintegrating tablet Take 1 tablet (4 mg total) by mouth every 8 (eight) hours as needed for nausea or vomiting. 30 tablet 0  . Polyethyl Glycol-Propyl Glycol (SYSTANE) 0.4-0.3 % SOLN Apply to eye as needed.    . traMADol (ULTRAM) 50 MG tablet Take 1 tablet (50 mg total) by mouth 3 (three) times daily as needed for moderate pain or severe pain. For increased pain. 270 tablet 0  . clindamycin (CLINDAGEL) 1 % gel Apply topically 2 (two) times daily. 60 g 3   Current Facility-Administered Medications  Medication Dose Route Frequency Provider Last Rate Last Dose  .  0.9 %  sodium chloride infusion  250 mL Intravenous Continuous Skeet Latch, MD      . sodium chloride flush (NS) 0.9 % injection 3 mL  3 mL Intravenous Q12H Skeet Latch, MD      . sodium chloride flush (NS) 0.9 % injection 3 mL  3 mL Intravenous PRN Skeet Latch, MD        REVIEW OF SYSTEMS:   Constitutional: Denies fevers, chills or abnormal night sweats  Eyes: Denies blurriness of vision, double vision or watery eyes Ears, nose, mouth, throat, and face: Denies mucositis or sore throat Respiratory: Denies cough, dyspnea or wheezes Cardiovascular: Denies palpitation, chest discomfort  Gastrointestinal:  Denies heartburn  Skin: (+) rash on face, scalp, minimally on left arm, improving (+) redness around nails  Lymphatics: Denies new lymphadenopathy or easy bruising MSK: negative Neurological:Denies numbness, tingling or new weaknesses Behavioral/Psych: Mood is stable, no new changes  All other systems were reviewed with the patient and are negative.  PHYSICAL EXAMINATION:  ECOG PERFORMANCE STATUS: 1 - Symptomatic but completely ambulatory  Vitals:   07/24/17 1252  BP: 139/80  Pulse: 89  Resp: 20  Temp: (!) 97.4 F (36.3 C)  SpO2: 98%   Filed Weights   07/24/17 1252  Weight: 179 lb 6.4 oz (81.4 kg)     GENERAL:alert, no distress and comfortable SKIN: skin color, texture, turgor are normal (+) a few acne-like rash on face, no visible rash or skin erythema on scalp, dry skin on palms, (+) skin crack on left thumb  EYES: normal, conjunctiva are pink and non-injected, sclera clear OROPHARYNX:no exudate, no erythema and lips, buccal mucosa, and tongue normal  NECK: supple, thyroid normal size, non-tender, without nodularity LYMPH:  no palpable lymphadenopathy in the cervical, axillary or inguinal LUNGS: clear to auscultation and percussion with  normal breathing effort HEART: regular rate & rhythm and no murmurs and (+)symmetrical lower extremity edema    ABDOMEN:abdomen soft, non-tender and normal bowel sounds Musculoskeletal:no cyanosis of digits and no clubbing  PSYCH: alert & oriented x 3 with fluent speech NEURO: no focal motor/sensory deficits   LABORATORY DATA:  I have reviewed the data as listed CBC Latest Ref Rng & Units 07/24/2017 07/09/2017 06/20/2017  WBC 3.9 - 10.3 10e3/uL 7.1 7.0 9.2  Hemoglobin 11.6 - 15.9 g/dL 13.0 12.0 11.7  Hematocrit 34.8 - 46.6 % 40.0 36.9 36.6  Platelets 145 - 400 10e3/uL 278 272 301   CMP Latest Ref Rng & Units 07/24/2017 07/09/2017 06/20/2017  Glucose 70 - 140 mg/dl 83 82 87  BUN 7.0 - 26.0 mg/dL 10.7 14.9 12.5  Creatinine 0.6 - 1.1 mg/dL 0.9 0.9 0.8  Sodium 136 - 145 mEq/L 139 139 137  Potassium 3.5 - 5.1 mEq/L 3.3(L) 3.9 3.6  Chloride 96 - 106 mmol/L - - -  CO2 22 - 29 mEq/L _0 Calcium 8.4 - 10.4 mg/dL 8.9 9.3 8.9  Total Protein 6.4 - 8.3 g/dL 7.8 7.7 7.7  Total Bilirubin 0.20 - 1.20 mg/dL 0.68 0.46 0.54  Alkaline Phos 40 - 150 U/L 111 97 100  AST 5 - 34 U/L _1 ALT 0 - 55 U/L _2 PATHOLOGY: [facility] 11/11/16   Diagnosis 11/11/16 Liver, needle/core biopsy, left lobe - METASTATIC ADENOCARCINOMA. - SEE COMMENT. Microscopic Comment The histologic features are consistent with a primary gastrointestinal adenocarcinoma. Dr. Vicente Males has reviewed the case and concurs with this interpretation. There is likely sufficient tumor present for additional studies, if requested. Enid Cutter MD Pathologist, Electronic Signature (Case signed 11/12/2016)  Diagnosis 09/04/2016 MODERATELY DIFFERENTIATED ADENOCARCINOMA OF COLON AT 20 CM Tumor Site: Colon at 20 cm Tumor Type: Adenocarcinoma Tumore Size: Unknown  Grade: Moderately differentiated   RADIOGRAPHIC STUDIES: I have personally reviewed the radiological images as listed and agreed with the findings in the report.  CT Chest 06/06/17  IMPRESSION: 1. There are multiple new and enlarging pulmonary nodules  throughout the lungs bilaterally. Additionally, a few of the nodules have decreased in size when compared to prior. 2. Interval increase in size of precarinal lymph node. 3. Interval increase in size of hepatic lesions. 4. Aortic Atherosclerosis (ICD10-I70.0).  CT Abdomen Pelvis, 05/19/2017 IMPRESSION: 1. Worsening pulmonary and hepatic metastatic disease. Primary sigmoid lesion is stable to minimally enlarged. 2. Stable to slight enlargement of a left perinephric mass, likely a metastasis. Renal cell carcinoma is not excluded. Associated pelvocaliectasis versus mild hydronephrosis. 3. Borderline left periaortic lymph nodes, slightly enlarged. 4. Cholelithiasis. 5.  Aortic atherosclerosis (ICD10-170.0). 6. Cystic and solid lesion in the left adnexa, as on prior exams, likely ovarian in origin.  PET 11/06/2016 IMPRESSION: 1. Hypermetabolic pulmonary and hepatic lesions, most consistent with metastatic colon cancer, with the hypermetabolic primary located in the sigmoid colon. 2. Left renal and left adnexal lesions, better seen and evaluated on 10/15/2016. 3. Persistent moderate left hydronephrosis. 4. Aortic atherosclerosis (ICD10-170.0).  MRI Abdomen Pelvis w/wo Contrast 10/15/2016 IMPRESSION: 1. There are 2 enhancing hepatic lesions highly worrisome for metastatic disease, likely metastatic colon cancer. 2. The lesion of concern in the anterior suprahilar lip of the left kidney is also enhancing, worrisome for neoplasm. This has a somewhat atypical appearance for renal cell carcinoma and could reflect a metastasis as well. 3. New left-sided hydroureter with delayed contrast excretion consistent with distal  ureteral obstruction. Specific etiology uncertain. 4. Complex cystic and solid left adnexal lesion could reflect cystic neoplasm, although does not appear aggressive or appear to reflect the cause of the distal left ureteral obstruction. 5. Sigmoid colon wall thickening  could be related to reported newly diagnosed colon cancer.  CT CAP w Contrast 09/06/2016 (done outside) IMPRESSION: 1. No acute abdominopelvic process.  2. Heterogenous hypodense area involving the medial edge of the left kidney with multiple small soft tissue like stranding involving the left pararenal space. Findings are suspicious for malignancy of the left kidney with small pararenal metastases. MRI abdomen with contrast should be considered to further evaluate.  3. Vague hypodensity involving the lateral segment of the liver. This can also be reevaluated with MRI.  4. Partially visualized 5 mm posterior right lower lobe nodular density. CT chest without contrast follow up is recommended.  5. Complex multilobular left ovarian lesion may reflect a cluster of cysts.    PROCEDURES Echocardiogram 11/03/16 - Left ventricle: The cavity size was normal. Wall thickness was   increased in a pattern of mild LVH. Indeterminate diastolic   function (atrial fibrillation). Systolic function was normal. The   estimated ejection fraction was in the range of 60% to 65%.   Operative Report by Dr. Tresa Moore on 10/10/16 Procedure: 1) Cystoscopy, left retrograde pyelogram interpretation. 2) Left diagnostic uteroscopy. 3) Insertion of left uteral stent; 5 x 24 Polaris with tether. Findings: 1) Unremarkable bladder. 2) Unremarkable left retrograde pyelogram. 3) No evidence of intraluminal urothelial neoplasm whatsoever with inspection of the left kidney and ureter times several. 4) Successful placement of left ureteral stent, proximal in renal pelvis and distal in urinary bladder.  Colonoscopy 09/02/2016 FINDINGS: Diverticulosis (scattered). Pt has about 10 cm structure starting at 20 cm with abnormal mucosa. The colonoscopy scope was not able to advance due to structure, and upper endoscopy scope was used and it was advanced through the stricture to see ileocecal valve. Biopsies at the stricture were taken.    ASSESSMENT & PLAN:  Hisae is a 83 y.o. female   1. Adenocarcinoma of left colon, sigmoid colon, TxNxM1 with liver and lung mets, stage IV, KRAS/NRS wild type, MSI-stable  -I previously discussed the imaging, colonoscopy and biopsy results with the patient and her family in detail.  -She has seen Drs. Manny and Gross, surgical resections were discussed.  -PET scan on 11/06/16 showed hypermetabolic pulmonary and hepatic lesions consistent with metastatic colon cancer with the primary located in the sigmoid colon. I reviewed the images with pt and her family members in person  -US biopsy on 11/11/16 of the left lobe of the liver revealed metastatic adenocarcinoma. The features are consistent with a primary gastrointestinal adenocarcinoma. I discussed with pt  -We previously discussed that her metastatic cancer is incurable at this stage due to the multiorgan metastasis, and her advanced age. The goal of therapy is palliative. -We discussed the indication for surgery, including significant bleeding, obstruction, or perforation. She does have mild constipation and intermittent abdominal cramps, but no significant bowel obstruction. Patient understands the surgery is palliative. She prefers to avoid surgery at this point.  -We reviewed [facility] test results, which showed MSI- stable, no KRAS/NRAS or BRAF mutations. Based on this, she is not a candidate for immunotherapy, but she will likely benefit from EGFR inhibitor, giving the left side colon cancer. --The [facility] results showed she is not a candidate for immunotherapy, but she is a candidate for EGFR antibody therapy. -We discussed  that the prognosis of left side colon cancer without KRAS/NRAS and BRAF mutations is much better than right side colon cancer, especially with EGFR antibody treatment. -We discussed the options of systemic therapy. Due to her advanced age, she is not a candidate for intensive chemotherapy. I recommend that  panitumumab, an EGFR inhibitor with low intensity chemo (5-FU infusion or Xeloda to control her cancer. -We discussed alternative management options with palliative care alone, which will be also appropriate given her advanced age and medical comorbidities. -She has recently developed worsening abdominal pain, constipation and nausea, concerning for bowel obstruction from her sigmoid colon cancer. I recommended she increase Miralax and Colace.  - I recommended low residual, low fiber diet, she is tolerating well, her nausea and abdominal discomfort has improved -I reviewed her CT abdomen and pelvis from 05/19/17, which showed disease progression in liver and lung, the primary sigmoid colon mass is stable, no imaging evidence of bowel obstruction. -she has started first line Vectibix every [redacted] weeks along with oral chemo Xeloda for 1 week on and 1 week off. She has tolerated well overall, except mild skin rash -I advised her to use Zofran sublingually for faster relief of nausea. Refilled on 07/09/17 -labs reviewed, and adequate to continue with next cycle Xeloda (1 week on and 1 week off) and Vectabix  -F/u on 12/28 -plan to repeat restaging CT on 1/8  2. Left renal mass and left hydronephrosis -Possible renal cell carcinoma, metastatic lesion from colon cancer is also a possibility. -She has been seen by [facility], Dr. Tresa Moore, and had left ureter stent placement on 10/10/2016.   3. Complex cyst of left ovary -will monitor. Likely benign -If she undergo abdominal surgery, may remove it -She declines surgery for now  4. Atrial Fibrillation -The patient has a history of atrial fibrillation. -Her cardiologist wants her to undergo cardio inversion and anticoagulation. -I previously advised the patient that even if she undergoes cardio inversion, her Afib might return. Also, her anticoagulation medication might make her bleeding worse. -The patient should continue taking baby aspirin.  5. HTN -I  previously advised the patient to continue medications and follow up with primary care physician  6. Nutrition -We previously discussed palliative chemotherapy will make her fatigued, nauseous, and loss of appetite. -I previously stressed the importance of a healthy diet with plenty of protein. -I previously referred to Nutrition.   7. Left flank and abdominal pain  -The patient is taking Aleve for pain. -I prescribed Tylenol #3 on 11/13/16. -The patient discontinued Tylenol #3 due to chest discomfort and headaches. -she may try tramadol, she is able to tolerate tramadol -Pain has improved. Her mail order will only allow monthly supply starting in 2019.  -refilled tramadol today (07/24/17), I expressed if she only has mild pain she can take tylenol  8. Anemia  -The patient's Hb is 11.8 on 4.4 and has decreased from 12.3 on 10/31/16 and 13.9 on 10/10/16. This is blood loss from her tumor. -I advised the patient to take an baby dose iron pills and eat iron rich foods. -She is to drink plenty of fluids to prevent constipation. -Hg 11.9 on 05/08/17 -currently resolved.   9. Goal of care discussion  -We again discussed the incurable nature of her cancer, and the overall poor prognosis, especially if she does not have good response to chemotherapy or progress on chemo -The patient understands the goal of care is palliative. -I previously recommended DNR/DNI, she will think about it  10. LE swelling -She has swelling in lower left leg and both feet  -she wear compression socks   11. Palmer erythema and dryness, Facial Acne type rash, secondary to chemo -Continue hydrocortisone for acneiform rash -Eucerin, oatmeal bath, po benadryl PRN and Claritin for body itching -I will call in clindamycin gel today 07/09/17, refilled on 07/24/17 -if rash gets worse, will consider oral doxycycline  For her hands I suggest a vinegar and water soak along with daily neosporin around her nails. I recommend she  use lotion after she washes her hands and advices her to wear cotton glove to protect her skin.  12. Hypokalemia -K 3.3 today, nodiarrhea -I called in KCL 53mq daily for 5 days for her  Plan -Refill clindamycin gel and tramadol today -lab, f/u and Vectibix on 12/28 and 1/10  -CT CAP with contrast on 1/8     All questions were answered. The patient knows to call the clinic with any problems, questions or concerns. I spent a total of 30 minutes for her visit, more than 50% face-to-face counseling.   FTruitt Merle MD 07/24/2017   This document serves as a record of services personally performed by YTruitt Merle MD. It was created on her behalf by AJoslyn Devon a trained medical scribe. The creation of this record is based on the scribe's personal observations and the provider's statements to them.    I have reviewed the above documentation for accuracy and completeness, and I agree with the above.

## 2017-07-24 ENCOUNTER — Ambulatory Visit (HOSPITAL_BASED_OUTPATIENT_CLINIC_OR_DEPARTMENT_OTHER): Payer: Medicare Other | Admitting: Hematology

## 2017-07-24 ENCOUNTER — Encounter: Payer: Self-pay | Admitting: Hematology

## 2017-07-24 ENCOUNTER — Telehealth: Payer: Self-pay | Admitting: Hematology

## 2017-07-24 ENCOUNTER — Ambulatory Visit (HOSPITAL_BASED_OUTPATIENT_CLINIC_OR_DEPARTMENT_OTHER): Payer: Medicare Other

## 2017-07-24 ENCOUNTER — Other Ambulatory Visit (HOSPITAL_BASED_OUTPATIENT_CLINIC_OR_DEPARTMENT_OTHER): Payer: Medicare Other

## 2017-07-24 VITALS — BP 139/80 | HR 89 | Temp 97.4°F | Resp 20 | Ht 66.0 in | Wt 179.4 lb

## 2017-07-24 DIAGNOSIS — L270 Generalized skin eruption due to drugs and medicaments taken internally: Secondary | ICD-10-CM

## 2017-07-24 DIAGNOSIS — C787 Secondary malignant neoplasm of liver and intrahepatic bile duct: Secondary | ICD-10-CM | POA: Diagnosis not present

## 2017-07-24 DIAGNOSIS — C7800 Secondary malignant neoplasm of unspecified lung: Secondary | ICD-10-CM | POA: Diagnosis not present

## 2017-07-24 DIAGNOSIS — E876 Hypokalemia: Secondary | ICD-10-CM

## 2017-07-24 DIAGNOSIS — C187 Malignant neoplasm of sigmoid colon: Secondary | ICD-10-CM | POA: Diagnosis not present

## 2017-07-24 DIAGNOSIS — C186 Malignant neoplasm of descending colon: Secondary | ICD-10-CM

## 2017-07-24 DIAGNOSIS — I10 Essential (primary) hypertension: Secondary | ICD-10-CM

## 2017-07-24 DIAGNOSIS — Z5112 Encounter for antineoplastic immunotherapy: Secondary | ICD-10-CM

## 2017-07-24 LAB — COMPREHENSIVE METABOLIC PANEL
ALT: 13 U/L (ref 0–55)
AST: 20 U/L (ref 5–34)
Albumin: 3.9 g/dL (ref 3.5–5.0)
Alkaline Phosphatase: 111 U/L (ref 40–150)
Anion Gap: 11 mEq/L (ref 3–11)
BUN: 10.7 mg/dL (ref 7.0–26.0)
CHLORIDE: 105 meq/L (ref 98–109)
CO2: 24 mEq/L (ref 22–29)
Calcium: 8.9 mg/dL (ref 8.4–10.4)
Creatinine: 0.9 mg/dL (ref 0.6–1.1)
EGFR: >60 mL/min/{1.73_m2} (ref >60)
GLUCOSE: 83 mg/dL (ref 70–140)
POTASSIUM: 3.3 meq/L — AB (ref 3.5–5.1)
SODIUM: 139 meq/L (ref 136–145)
Total Bilirubin: 0.68 mg/dL (ref 0.20–1.20)
Total Protein: 7.8 g/dL (ref 6.4–8.3)

## 2017-07-24 LAB — CBC WITH DIFFERENTIAL/PLATELET
BASO%: 0.8 % (ref 0.0–2.0)
Basophils Absolute: 0.1 10*3/uL (ref 0.0–0.1)
EOS%: 4.3 % (ref 0.0–7.0)
Eosinophils Absolute: 0.3 10*3/uL (ref 0.0–0.5)
HEMATOCRIT: 40 % (ref 34.8–46.6)
HEMOGLOBIN: 13 g/dL (ref 11.6–15.9)
LYMPH#: 1.7 10*3/uL (ref 0.9–3.3)
LYMPH%: 24.1 % (ref 14.0–49.7)
MCH: 29.8 pg (ref 25.1–34.0)
MCHC: 32.4 g/dL (ref 31.5–36.0)
MCV: 92.1 fL (ref 79.5–101.0)
MONO#: 1 10*3/uL — AB (ref 0.1–0.9)
MONO%: 13.4 % (ref 0.0–14.0)
NEUT%: 57.4 % (ref 38.4–76.8)
NEUTROS ABS: 4.1 10*3/uL (ref 1.5–6.5)
PLATELETS: 278 10*3/uL (ref 145–400)
RBC: 4.35 10*6/uL (ref 3.70–5.45)
RDW: 18.2 % — AB (ref 11.2–14.5)
WBC: 7.1 10*3/uL (ref 3.9–10.3)

## 2017-07-24 LAB — MAGNESIUM: Magnesium: 2.4 mg/dl (ref 1.5–2.5)

## 2017-07-24 MED ORDER — SODIUM CHLORIDE 0.9 % IV SOLN
Freq: Once | INTRAVENOUS | Status: AC
  Administered 2017-07-24: 15:00:00 via INTRAVENOUS

## 2017-07-24 MED ORDER — CLINDAMYCIN PHOSPHATE 1 % EX GEL: Freq: Two times a day (BID) | CUTANEOUS | 3 refills | Status: DC

## 2017-07-24 MED ORDER — POTASSIUM CHLORIDE CRYS ER 20 MEQ PO TBCR: 20.0000 meq | EXTENDED_RELEASE_TABLET | Freq: Once | ORAL | 0 refills | Status: DC

## 2017-07-24 MED ORDER — PANITUMUMAB CHEMO INJECTION 100 MG/5ML
6.0000 mg/kg | Freq: Once | INTRAVENOUS | Status: AC
  Administered 2017-07-24: 500 mg via INTRAVENOUS
  Filled 2017-07-24: qty 20

## 2017-07-24 MED ORDER — TRAMADOL HCL 50 MG PO TABS: 50.0000 mg | ORAL_TABLET | Freq: Three times a day (TID) | ORAL | 0 refills | Status: DC | PRN

## 2017-07-24 NOTE — Telephone Encounter (Signed)
Scheduled appt per 12/13 los - Gave patient AVS and calender per los.  

## 2017-07-24 NOTE — Patient Instructions (Signed)
[facility] Discharge Instructions for Patients Receiving Chemotherapy  Today you received the following chemotherapy agent: Panitumumab (Vectibix).  To help prevent nausea and vomiting after your treatment, we encourage you to take your nausea medication as prescribed.   If you develop nausea and vomiting that is not controlled by your nausea medication, call the clinic.   BELOW ARE SYMPTOMS THAT SHOULD BE REPORTED IMMEDIATELY:  *FEVER GREATER THAN 100.5 F  *CHILLS WITH OR WITHOUT FEVER  NAUSEA AND VOMITING THAT IS NOT CONTROLLED WITH YOUR NAUSEA MEDICATION  *UNUSUAL SHORTNESS OF BREATH  *UNUSUAL BRUISING OR BLEEDING  TENDERNESS IN MOUTH AND THROAT WITH OR WITHOUT PRESENCE OF ULCERS  *URINARY PROBLEMS  *BOWEL PROBLEMS  UNUSUAL RASH Items with * indicate a potential emergency and should be followed up as soon as possible.  Feel free to call the clinic should you have any questions or concerns. The clinic phone number is [phone].  Please show the CHEMO ALERT CARD at check-in to the Emergency Department and triage nurse. 

## 2017-07-29 ENCOUNTER — Other Ambulatory Visit: Payer: Self-pay | Admitting: Hematology

## 2017-07-29 DIAGNOSIS — C186 Malignant neoplasm of descending colon: Secondary | ICD-10-CM

## 2017-08-01 MED FILL — CAPECITABINE 500 MG TABLET: 500 | 28 days supply | Qty: 112 | Fill #1

## 2017-08-07 ENCOUNTER — Emergency Department (HOSPITAL_COMMUNITY): Payer: Medicare Other

## 2017-08-07 ENCOUNTER — Ambulatory Visit (HOSPITAL_COMMUNITY)
Admission: RE | Admit: 2017-08-07 | Discharge: 2017-08-07 | Disposition: A | Discharge status: Home / Self Care | Payer: Medicare Other | Source: Ambulatory Visit | Attending: Medical | Admitting: Medical

## 2017-08-07 ENCOUNTER — Ambulatory Visit (HOSPITAL_BASED_OUTPATIENT_CLINIC_OR_DEPARTMENT_OTHER): Payer: Medicare Other | Admitting: Medical

## 2017-08-07 ENCOUNTER — Ambulatory Visit (HOSPITAL_BASED_OUTPATIENT_CLINIC_OR_DEPARTMENT_OTHER): Payer: Medicare Other

## 2017-08-07 ENCOUNTER — Telehealth: Payer: Self-pay | Admitting: *Deleted

## 2017-08-07 ENCOUNTER — Other Ambulatory Visit: Payer: Self-pay | Admitting: *Deleted

## 2017-08-07 ENCOUNTER — Inpatient Hospital Stay (HOSPITAL_COMMUNITY)
Admission: EM | Admit: 2017-08-07 | Discharge: 2017-08-16 | DRG: 375 | Disposition: A | Discharge status: Home / Self Care | Payer: Medicare Other | Attending: Nephrology | Admitting: Nephrology

## 2017-08-07 ENCOUNTER — Other Ambulatory Visit: Payer: Self-pay

## 2017-08-07 ENCOUNTER — Encounter (HOSPITAL_COMMUNITY): Payer: Self-pay | Admitting: Nurse Practitioner

## 2017-08-07 VITALS — BP 97/76 | HR 101 | Temp 98.1°F | Resp 24 | Ht 66.0 in

## 2017-08-07 DIAGNOSIS — I2510 Atherosclerotic heart disease of native coronary artery without angina pectoris: Secondary | ICD-10-CM | POA: Diagnosis present

## 2017-08-07 DIAGNOSIS — Z888 Allergy status to other drugs, medicaments and biological substances status: Secondary | ICD-10-CM | POA: Diagnosis not present

## 2017-08-07 DIAGNOSIS — E876 Hypokalemia: Secondary | ICD-10-CM | POA: Diagnosis not present

## 2017-08-07 DIAGNOSIS — H548 Legal blindness, as defined in USA: Secondary | ICD-10-CM | POA: Diagnosis present

## 2017-08-07 DIAGNOSIS — Z974 Presence of external hearing-aid: Secondary | ICD-10-CM | POA: Diagnosis not present

## 2017-08-07 DIAGNOSIS — Z79899 Other long term (current) drug therapy: Secondary | ICD-10-CM | POA: Diagnosis not present

## 2017-08-07 DIAGNOSIS — C189 Malignant neoplasm of colon, unspecified: Secondary | ICD-10-CM | POA: Diagnosis not present

## 2017-08-07 DIAGNOSIS — I4819 Other persistent atrial fibrillation: Secondary | ICD-10-CM | POA: Diagnosis present

## 2017-08-07 DIAGNOSIS — C187 Malignant neoplasm of sigmoid colon: Secondary | ICD-10-CM

## 2017-08-07 DIAGNOSIS — R739 Hyperglycemia, unspecified: Secondary | ICD-10-CM | POA: Diagnosis present

## 2017-08-07 DIAGNOSIS — H9190 Unspecified hearing loss, unspecified ear: Secondary | ICD-10-CM | POA: Diagnosis present

## 2017-08-07 DIAGNOSIS — Z881 Allergy status to other antibiotic agents status: Secondary | ICD-10-CM | POA: Diagnosis not present

## 2017-08-07 DIAGNOSIS — H3530 Unspecified macular degeneration: Secondary | ICD-10-CM | POA: Diagnosis present

## 2017-08-07 DIAGNOSIS — E785 Hyperlipidemia, unspecified: Secondary | ICD-10-CM | POA: Diagnosis present

## 2017-08-07 DIAGNOSIS — I480 Paroxysmal atrial fibrillation: Secondary | ICD-10-CM | POA: Diagnosis present

## 2017-08-07 DIAGNOSIS — I4891 Unspecified atrial fibrillation: Secondary | ICD-10-CM | POA: Diagnosis not present

## 2017-08-07 DIAGNOSIS — K56609 Unspecified intestinal obstruction, unspecified as to partial versus complete obstruction: Secondary | ICD-10-CM

## 2017-08-07 DIAGNOSIS — C787 Secondary malignant neoplasm of liver and intrahepatic bile duct: Secondary | ICD-10-CM | POA: Diagnosis present

## 2017-08-07 DIAGNOSIS — D490 Neoplasm of unspecified behavior of digestive system: Secondary | ICD-10-CM | POA: Diagnosis not present

## 2017-08-07 DIAGNOSIS — R10819 Abdominal tenderness, unspecified site: Secondary | ICD-10-CM | POA: Diagnosis not present

## 2017-08-07 DIAGNOSIS — Z9221 Personal history of antineoplastic chemotherapy: Secondary | ICD-10-CM | POA: Diagnosis not present

## 2017-08-07 DIAGNOSIS — C186 Malignant neoplasm of descending colon: Secondary | ICD-10-CM | POA: Diagnosis not present

## 2017-08-07 DIAGNOSIS — K56699 Other intestinal obstruction unspecified as to partial versus complete obstruction: Secondary | ICD-10-CM

## 2017-08-07 DIAGNOSIS — R5383 Other fatigue: Secondary | ICD-10-CM | POA: Diagnosis not present

## 2017-08-07 DIAGNOSIS — D72823 Leukemoid reaction: Secondary | ICD-10-CM

## 2017-08-07 DIAGNOSIS — R197 Diarrhea, unspecified: Secondary | ICD-10-CM

## 2017-08-07 DIAGNOSIS — R509 Fever, unspecified: Secondary | ICD-10-CM | POA: Diagnosis not present

## 2017-08-07 DIAGNOSIS — A419 Sepsis, unspecified organism: Secondary | ICD-10-CM

## 2017-08-07 DIAGNOSIS — C19 Malignant neoplasm of rectosigmoid junction: Secondary | ICD-10-CM | POA: Diagnosis present

## 2017-08-07 DIAGNOSIS — R61 Generalized hyperhidrosis: Secondary | ICD-10-CM

## 2017-08-07 DIAGNOSIS — R140 Abdominal distension (gaseous): Secondary | ICD-10-CM | POA: Diagnosis not present

## 2017-08-07 DIAGNOSIS — G893 Neoplasm related pain (acute) (chronic): Secondary | ICD-10-CM | POA: Diagnosis not present

## 2017-08-07 DIAGNOSIS — R52 Pain, unspecified: Secondary | ICD-10-CM | POA: Diagnosis not present

## 2017-08-07 DIAGNOSIS — I10 Essential (primary) hypertension: Secondary | ICD-10-CM | POA: Diagnosis present

## 2017-08-07 DIAGNOSIS — Z515 Encounter for palliative care: Secondary | ICD-10-CM | POA: Diagnosis present

## 2017-08-07 DIAGNOSIS — R1084 Generalized abdominal pain: Secondary | ICD-10-CM | POA: Diagnosis not present

## 2017-08-07 DIAGNOSIS — K219 Gastro-esophageal reflux disease without esophagitis: Secondary | ICD-10-CM | POA: Diagnosis present

## 2017-08-07 DIAGNOSIS — R933 Abnormal findings on diagnostic imaging of other parts of digestive tract: Secondary | ICD-10-CM | POA: Diagnosis not present

## 2017-08-07 DIAGNOSIS — R112 Nausea with vomiting, unspecified: Secondary | ICD-10-CM

## 2017-08-07 DIAGNOSIS — K5669 Other partial intestinal obstruction: Secondary | ICD-10-CM | POA: Diagnosis not present

## 2017-08-07 DIAGNOSIS — G4700 Insomnia, unspecified: Secondary | ICD-10-CM | POA: Diagnosis not present

## 2017-08-07 DIAGNOSIS — H409 Unspecified glaucoma: Secondary | ICD-10-CM | POA: Diagnosis present

## 2017-08-07 DIAGNOSIS — C7800 Secondary malignant neoplasm of unspecified lung: Secondary | ICD-10-CM | POA: Diagnosis not present

## 2017-08-07 DIAGNOSIS — I481 Persistent atrial fibrillation: Secondary | ICD-10-CM | POA: Diagnosis not present

## 2017-08-07 DIAGNOSIS — Z7189 Other specified counseling: Secondary | ICD-10-CM | POA: Diagnosis not present

## 2017-08-07 DIAGNOSIS — Z66 Do not resuscitate: Secondary | ICD-10-CM | POA: Diagnosis present

## 2017-08-07 DIAGNOSIS — K5900 Constipation, unspecified: Secondary | ICD-10-CM | POA: Diagnosis not present

## 2017-08-07 LAB — CBC WITH DIFFERENTIAL/PLATELET
BASO%: 0.2 % (ref 0.0–2.0)
Basophils Absolute: 0 10*3/uL (ref 0.0–0.1)
EOS%: 0 % (ref 0.0–7.0)
Eosinophils Absolute: 0 10*3/uL (ref 0.0–0.5)
HCT: 46.3 % (ref 34.8–46.6)
HGB: 14.9 g/dL (ref 11.6–15.9)
LYMPH%: 6.5 % — AB (ref 14.0–49.7)
MCH: 30.2 pg (ref 25.1–34.0)
MCHC: 32.2 g/dL (ref 31.5–36.0)
MCV: 93.6 fL (ref 79.5–101.0)
MONO#: 1.3 10*3/uL — AB (ref 0.1–0.9)
MONO%: 7.1 % (ref 0.0–14.0)
NEUT#: 15.7 10*3/uL — ABNORMAL HIGH (ref 1.5–6.5)
NEUT%: 86.2 % — AB (ref 38.4–76.8)
PLATELETS: 387 10*3/uL (ref 145–400)
RBC: 4.94 10*6/uL (ref 3.70–5.45)
RDW: 19.7 % — ABNORMAL HIGH (ref 11.2–14.5)
WBC: 18.2 10*3/uL — ABNORMAL HIGH (ref 3.9–10.3)
lymph#: 1.2 10*3/uL (ref 0.9–3.3)

## 2017-08-07 LAB — COMPREHENSIVE METABOLIC PANEL
ALBUMIN: 3.9 g/dL (ref 3.5–5.0)
ALK PHOS: 121 U/L (ref 40–150)
ALT: 48 U/L (ref 0–55)
AST: 53 U/L — AB (ref 5–34)
Anion Gap: 13 mEq/L — ABNORMAL HIGH (ref 3–11)
BILIRUBIN TOTAL: 1.25 mg/dL — AB (ref 0.20–1.20)
BUN: 21.5 mg/dL (ref 7.0–26.0)
CO2: 26 mEq/L (ref 22–29)
Calcium: 9.6 mg/dL (ref 8.4–10.4)
Chloride: 104 mEq/L (ref 98–109)
Creatinine: 1.4 mg/dL — ABNORMAL HIGH (ref 0.6–1.1)
EGFR: 34 mL/min/{1.73_m2} — ABNORMAL LOW (ref >60)
GLUCOSE: 206 mg/dL — AB (ref 70–140)
Potassium: 4.4 mEq/L (ref 3.5–5.1)
SODIUM: 142 meq/L (ref 136–145)
TOTAL PROTEIN: 8.1 g/dL (ref 6.4–8.3)

## 2017-08-07 MED ORDER — DEXTROSE 5 % IV SOLN
2.0000 g | Freq: Once | INTRAVENOUS | Status: DC
  Filled 2017-08-07: qty 2

## 2017-08-07 MED ORDER — DIPHENHYDRAMINE HCL 12.5 MG/5ML PO ELIX: 12.5000 mg | ORAL_SOLUTION | Freq: Four times a day (QID) | ORAL | Status: DC | PRN

## 2017-08-07 MED ORDER — POLYVINYL ALCOHOL 1.4 % OP SOLN: 1.0000 [drp] | OPHTHALMIC | Status: DC | PRN

## 2017-08-07 MED ORDER — HYDROMORPHONE HCL 1 MG/ML IJ SOLN
0.5000 mg | INTRAMUSCULAR | Status: DC | PRN
  Administered 2017-08-07: 0.5 mg via INTRAVENOUS
  Filled 2017-08-07: qty 1

## 2017-08-07 MED ORDER — DIPHENHYDRAMINE HCL 50 MG/ML IJ SOLN: 12.5000 mg | Freq: Four times a day (QID) | INTRAMUSCULAR | Status: DC | PRN

## 2017-08-07 MED ORDER — POLYETHYL GLYCOL-PROPYL GLYCOL 0.4-0.3 % OP SOLN: 1.0000 [drp] | Freq: Every day | OPHTHALMIC | Status: DC | PRN

## 2017-08-07 MED ORDER — IOPAMIDOL (ISOVUE-300) INJECTION 61%
INTRAVENOUS | Status: AC
  Administered 2017-08-07: 18:00:00
  Filled 2017-08-07: qty 100

## 2017-08-07 MED ORDER — SODIUM CHLORIDE 0.9 % IV BOLUS (SEPSIS)
1000.0000 mL | Freq: Once | INTRAVENOUS | Status: AC
  Administered 2017-08-07: 1000 mL via INTRAVENOUS

## 2017-08-07 MED ORDER — ONDANSETRON HCL 4 MG/2ML IJ SOLN
4.0000 mg | Freq: Four times a day (QID) | INTRAMUSCULAR | Status: DC | PRN
  Administered 2017-08-07 – 2017-08-16 (×2): 4 mg via INTRAVENOUS
  Filled 2017-08-07 (×3): qty 2

## 2017-08-07 MED ORDER — HYDROMORPHONE 1 MG/ML IV SOLN
INTRAVENOUS | Status: DC
  Administered 2017-08-07: 22:00:00 via INTRAVENOUS
  Administered 2017-08-09: 0 mg via INTRAVENOUS
  Administered 2017-08-10: 0.4 mg via INTRAVENOUS
  Filled 2017-08-07: qty 25

## 2017-08-07 MED ORDER — ACETAMINOPHEN 650 MG RE SUPP: 650.0000 mg | Freq: Four times a day (QID) | RECTAL | Status: DC | PRN

## 2017-08-07 MED ORDER — LACTATED RINGERS IV SOLN
INTRAVENOUS | Status: DC
  Administered 2017-08-07 – 2017-08-16 (×8): via INTRAVENOUS

## 2017-08-07 MED ORDER — ONDANSETRON HCL 4 MG/2ML IJ SOLN
4.0000 mg | Freq: Once | INTRAMUSCULAR | Status: AC
  Administered 2017-08-07: 4 mg via INTRAVENOUS
  Filled 2017-08-07: qty 2

## 2017-08-07 MED ORDER — METOPROLOL TARTRATE 5 MG/5ML IV SOLN: 5.0000 mg | Freq: Four times a day (QID) | INTRAVENOUS | Status: DC | PRN

## 2017-08-07 MED ORDER — LATANOPROST 0.005 % OP SOLN
1.0000 [drp] | Freq: Every day | OPHTHALMIC | Status: DC
  Administered 2017-08-08 – 2017-08-15 (×8): 1 [drp] via OPHTHALMIC
  Filled 2017-08-07: qty 2.5

## 2017-08-07 MED ORDER — NALOXONE HCL 0.4 MG/ML IJ SOLN: 0.4000 mg | INTRAMUSCULAR | Status: DC | PRN

## 2017-08-07 MED ORDER — SODIUM CHLORIDE 0.9% FLUSH: 9.0000 mL | INTRAVENOUS | Status: DC | PRN

## 2017-08-07 MED ORDER — DILTIAZEM HCL ER COATED BEADS 240 MG PO CP24
240.0000 mg | ORAL_CAPSULE | Freq: Every day | ORAL | Status: DC
Start: 2017-08-08 — End: 2017-08-16
  Administered 2017-08-08 – 2017-08-16 (×8): 240 mg via ORAL
  Filled 2017-08-07 (×8): qty 1

## 2017-08-07 MED ORDER — FENTANYL CITRATE (PF) 100 MCG/2ML IJ SOLN
50.0000 ug | Freq: Once | INTRAMUSCULAR | Status: AC
  Administered 2017-08-07: 50 ug via INTRAVENOUS
  Filled 2017-08-07: qty 2

## 2017-08-07 MED ORDER — IOPAMIDOL (ISOVUE-300) INJECTION 61%
100.0000 mL | Freq: Once | INTRAVENOUS | Status: AC | PRN
  Administered 2017-08-07: 100 mL via INTRAVENOUS

## 2017-08-07 MED ORDER — ACETAMINOPHEN 325 MG PO TABS: 650.0000 mg | ORAL_TABLET | Freq: Four times a day (QID) | ORAL | Status: DC | PRN

## 2017-08-07 NOTE — Telephone Encounter (Signed)
Received call from daughter-in-law, Colletta [place] & she reports pt had a bad night.  She reports 2nd half of mag citrate came back up & she has been vomiting all night.  She hasn't slept & has a burning sensation in her throat.  Discussed with Sandi Mealy PA/Symptom Management & he would like a KUV, CBC & CMET.  Pt coming from [place].  Daughter-in-law states it will be at least 10 am before she can leave to get pt & take 30" to get there & at least 1-1.5 hours to get here.  KUB ordered stat along with labs & to see Lucianne Lei @ 1pm.

## 2017-08-07 NOTE — Consult Note (Signed)
General Surgery [facility]  Reason for Consult: colonic obstruction, metastatic colorectal carcinoma  Referring Physician: Dr. Sherwood Gambler, [facility] ER  Mary [name] is an 83 y.o. female.  HPI: patient is an 83 yo WF with known metastatic colorectal carcinoma followed by Dr. Burr Medico at the [facility].  Patient had been evaluated by Dr. Neysa Bonito in February 2018 for possible surgical resection of distal sigmoid adenoCa.  Patient decided against operative intervention due to her cardiac status.  Patient has been on adjuvant therapy and last saw Dr. Burr Medico on 07/24/2017.  Now presents to ER with 5 day hx of constipation, increasing abdominal girth, abdominal pain, and nausea and emesis.  CTA in ER shows distal colonic obstruction likely secondary to tumor in the sigmoid colon.  General surgery consulted for evaluation and recommendations for management.  Apparently patient has had previous evaluation by GI regarding possible stent placement.  Patient states she does not have a GI physician here in [place], followed by GI in [place], [place].  Past Medical History:  Diagnosis Date  . Aneurysm of ascending aorta (HCC) 12/03/2016   3.7 cm by echo 10/2016  . Colon cancer [facility])    dx via biospy from colonoscopy-- malignant ---  currently having work-up done w/ dr gross (general surgeon) and coordinate surgery w/ urologsit for renal tumor  . Essential hypertension 10/31/2016  . GERD (gastroesophageal reflux disease)   . Glaucoma   . Hiatal hernia   . HOH (hard of hearing)    bilateral even w/ hearing aids  . Hyperlipidemia   . Kidney tumor    left side  . Legally blind in right eye, as defined in [place]    due to macular degeneration  . Macular degeneration   . Nausea    intermittant due to colon mass  . Ovarian cyst   . Persistent atrial fibrillation ([place]) 10/31/2016  . PONV (postoperative nausea and vomiting)   . Wears glasses   . Wears hearing aid    bilateral    Past Surgical History:  Procedure Laterality Date  . BREAST CYST EXCISION  1990   benign  . COLONOSCOPY  09/04/2016  . CYSTOSCOPY WITH RETROGRADE PYELOGRAM, URETEROSCOPY AND STENT PLACEMENT Left 10/10/2016   Procedure: CYSTOSCOPY WITH RETROGRADE PYELOGRAM, DIAGNOSTIC URETEROSCOPY  AND STENT PLACEMENT;  Surgeon: Alexis Frock, MD;  Location: [facility];  Service: Urology;  Laterality: Left;  . ESOPHAGOGASTRODUODENOSCOPY  11/07/2014  . NASAL SINUS SURGERY    . TONSILLECTOMY      Family History  Problem Relation Age of Onset  . Cancer Sister 92       breast cancer   . Cancer Brother 24       lung cancer   . Diabetes Son   . High blood pressure Son   . Peripheral Artery Disease Mother   . High blood pressure Mother     Social History:  reports that  has never smoked. she has never used smokeless tobacco. She reports that she does not drink alcohol or use drugs.  Allergies:  Allergies  Allergen Reactions  . Benazepril     COUGH   . Bactrim [Sulfamethoxazole-Trimethoprim] Nausea And Vomiting    Medications: I have reviewed the patient's current medications.  Results for orders placed or performed in visit on 08/07/17 (from the past 48 hour(s))  Comprehensive metabolic panel     Status: Abnormal   Collection Time: 08/07/17 12:48 PM  Result Value Ref Range  Sodium 142 136 - 145 mEq/L   Potassium 4.4 3.5 - 5.1 mEq/L   Chloride 104 98 - 109 mEq/L   CO2 26 22 - 29 mEq/L   Glucose 206 (H) 70 - 140 mg/dl    Comment: Glucose reference range is for nonfasting patients. Fasting glucose reference range is 70- 100.   BUN 21.5 7.0 - 26.0 mg/dL   Creatinine 1.4 (H) 0.6 - 1.1 mg/dL   Total Bilirubin 1.25 (H) 0.20 - 1.20 mg/dL   Alkaline Phosphatase 121 40 - 150 U/L   AST 53 (H) 5 - 34 U/L   ALT 48 0 - 55 U/L   Total Protein 8.1 6.4 - 8.3 g/dL   Albumin 3.9 3.5 - 5.0 g/dL   Calcium 9.6 8.4 - 10.4 mg/dL   Anion Gap 13 (H) 3 - 11 mEq/L   EGFR 34 (L)  >60 ml/min/1.73 m2    Comment: eGFR is calculated using the CKD-EPI Creatinine Equation (2009)  CBC with Differential     Status: Abnormal   Collection Time: 08/07/17 12:48 PM  Result Value Ref Range   WBC 18.2 (H) 3.9 - 10.3 10e3/uL   NEUT# 15.7 (H) 1.5 - 6.5 10e3/uL   HGB 14.9 11.6 - 15.9 g/dL   HCT 46.3 34.8 - 46.6 %   Platelets 387 145 - 400 10e3/uL   MCV 93.6 79.5 - 101.0 fL   MCH 30.2 25.1 - 34.0 pg   MCHC 32.2 31.5 - 36.0 g/dL   RBC 4.94 3.70 - 5.45 10e6/uL   RDW 19.7 (H) 11.2 - 14.5 %   lymph# 1.2 0.9 - 3.3 10e3/uL   MONO# 1.3 (H) 0.1 - 0.9 10e3/uL   Eosinophils Absolute 0.0 0.0 - 0.5 10e3/uL   Basophils Absolute 0.0 0.0 - 0.1 10e3/uL   NEUT% 86.2 (H) 38.4 - 76.8 %   LYMPH% 6.5 (L) 14.0 - 49.7 %   MONO% 7.1 0.0 - 14.0 %   EOS% 0.0 0.0 - 7.0 %   BASO% 0.2 0.0 - 2.0 %    Dg Abd 1 View  Result Date: 08/07/2017 CLINICAL DATA:  Constipation and abdominal pain with distention. History of left-sided colon cancer. EXAM: ABDOMEN - 1 VIEW COMPARISON:  CT 05/19/2017 FINDINGS: Gallbladder wall calcifications. No free intraperitoneal air on this supine exam. No gaseous distention of bowel loops. Right hemidiaphragm elevation. Distal gas and stool. IMPRESSION: No acute findings. Electronically Signed   By: Abigail Miyamoto M.D.   On: 08/07/2017 12:34   Ct Abdomen Pelvis W Contrast  Result Date: 08/07/2017 CLINICAL DATA:  Leukocytosis, abdominal pain. Metastatic colon cancer. EXAM: CT ABDOMEN AND PELVIS WITH CONTRAST TECHNIQUE: Multidetector CT imaging of the abdomen and pelvis was performed using the standard protocol following bolus administration of intravenous contrast. CONTRAST:  179m ISOVUE-300 IOPAMIDOL (ISOVUE-300) INJECTION 61% COMPARISON:  CT abdomen/pelvis dated 05/19/2017. Partial comparison to CT chest dated 06/06/2017. FINDINGS: Lower chest: Bilateral pulmonary metastases, stable versus mildly improved, including: --8 mm nodule in the medial right lower lobe (series 4/ image 4),  previously 10 mm --8 mm nodule inferiorly in the right lower lobe (series 4/ image 27), unchanged --15 x 16 mm left lower lobe nodule (series 4/ image 28), previously 16 x 17 mm Hepatobiliary: 1.6 x 2.9 cm metastasis in the anterior dome of the liver in segment 4A (series 2/ image 9), previously 3.6 x 4.7 cm. 1.2 x 1.7 cm metastasis in segment 3 (series 2/ image 23), previously 2.5 x 3.4 cm. Cholelithiasis, without associated inflammatory  changes. Pancreas: Within normal limits. Spleen: Within normal limits. Adrenals/Urinary Tract: Adrenal glands are within normal limits. 2.4 x 1.8 cm left perirenal mass medial to the left renal vein (series 2/ image 32), previously 2.6 x 2.3 cm, suspicious for perianal metastasis. Right kidney is within normal limits. No hydronephrosis. Bladder is low-lying but otherwise within normal limits. Stomach/Bowel: Stomach is notable for a small hiatal hernia. No evidence of small bowel obstruction. Normal appendix (series 2/image 66). Left colonic diverticulosis, without evidence of diverticulitis. Dilated colon leading to a low colonic stricture in the region of the prior sigmoid mass, which is no longer visualized (series 2/ image 73). This appearance is compatible with colonic obstruction secondary to the stricture. There is mild wall thickening involving the rectosigmoid colon proximal to the stricture (series 2/images 65 and 77). Vascular/Lymphatic: No evidence of abdominal aortic aneurysm. Atherosclerotic calcifications of the abdominal aorta and branch vessels. 7 mm short axis left para-aortic node (series 2/ image 34), previously 8 mm. Otherwise, no suspicious abdominopelvic lymphadenopathy. Reproductive: Status post hysterectomy. Right ovary is within normal limits. 3.5 x 1.8 cm mixed cystic/solid left ovarian mass (series 2/ image 73), previously 3.5 x 2.5 cm. Other: No abdominopelvic ascites. Musculoskeletal: Degenerative changes of the visualized thoracolumbar spine.  IMPRESSION: Low colonic obstruction secondary to a rectosigmoid stricture at the site of the patient's prior colonic mass, which is no longer evident on CT. Mild upstream colonic wall thickening raises the possibility of superimposed colitis. Improving hepatic metastases, as above. Left perirenal metastasis, decreased. Visualized pulmonary metastases at the lung bases are grossly unchanged. 3.5 cm mixed cystic/solid left ovarian mass is mildly decreased. Electronically Signed   By: Julian Hy M.D.   On: 08/07/2017 18:06    Review of Systems  Constitutional: Negative for chills and fever.  HENT: Negative.   Eyes: Negative.   Respiratory: Positive for cough.   Cardiovascular: Negative.   Gastrointestinal: Positive for abdominal pain, constipation, nausea and vomiting.  Genitourinary: Negative.   Musculoskeletal: Negative.   Skin: Negative.   Neurological: Positive for weakness.  Endo/Heme/Allergies: Negative.   Psychiatric/Behavioral: Negative.    Blood pressure 138/70, pulse 100, temperature 98.2 F (36.8 C), temperature source Oral, resp. rate 16, height 5' 6"  (1.676 m), weight 81.2 kg (179 lb), SpO2 99 %. Physical Exam  Constitutional: She is oriented to person, place, and time. She appears well-developed and well-nourished. No distress.  HENT:  Head: Normocephalic and atraumatic.  Right Ear: External ear normal.  Left Ear: External ear normal.  HOH  Eyes: Conjunctivae are normal. Pupils are equal, round, and reactive to light. No scleral icterus.  Neck: Normal range of motion. Neck supple. No tracheal deviation present. No thyromegaly present.  Cardiovascular: Normal rate, regular rhythm and normal heart sounds.  Respiratory: Effort normal and breath sounds normal. No respiratory distress. She has no wheezes.  GI: She exhibits distension (marked). She exhibits no mass. There is tenderness (mild diffuse). There is no rebound and no guarding.  Few bowel sounds present.  No  surgical incisions.  No herniae.  Musculoskeletal: Normal range of motion. She exhibits no edema or deformity.  Lymphadenopathy:    She has no cervical adenopathy.  Neurological: She is alert and oriented to person, place, and time.  Skin: Skin is warm and dry. She is not diaphoretic.  Psychiatric: She has a normal mood and affect. Her behavior is normal.    Assessment/Plan: Distal colonic obstruction, likely at rectosigmoid junction secondary to adenocarcinoma Metastatic colorectal carcinoma involving liver  and lungs CAD, Afib, HTN   Agree with admission to medical service for resuscitation and treatment of symptoms  Discussed surgical option of diverting colostomy with patient and family  Would ask GI to evaluate for possible palliative stent placement  If stenting not feasible, patient states would likely proceed with diverting colostomy  I will review with Dr. Alphonsa Overall in AM 12/28.  Timing of surgery pending GI consultation and patient wishes.  Armandina Gemma, [facility] Office: [place] 08/07/2017, 7:23 PM

## 2017-08-07 NOTE — ED Triage Notes (Signed)
Patient was at cancer center for her appointment. Blood was drawn and showed increased WBC and increasing abd pain. Patient has a history of metastatic colon cancer with mets to kidney and liver.

## 2017-08-07 NOTE — Progress Notes (Signed)
[facility] Progress Note   Mary [name] [id number] 02/07/1933 83 y.o.  Mary [name] is managed by Dr. Truitt Merle  Actively treated with chemotherapy: yes  Current Therapy: Vectibix and Xeloda  Last Treated: 07/24/2017  Assessment: Plan:    Leukemoid reaction - Plan: Culture, blood (routine x 2), ceFEPIme (MAXIPIME) 2 g in dextrose 5 % 50 mL IVPB, CANCELED: Urine Culture, CANCELED: Urinalysis with microscopic, CANCELED: DG Chest 2 View  Sepsis, due to unspecified organism ([place]) - Plan: Culture, blood (routine x 2), ceFEPIme (MAXIPIME) 2 g in dextrose 5 % 50 mL IVPB, CANCELED: Urine Culture, CANCELED: Urinalysis with microscopic, CANCELED: DG Chest 2 View   Leukemoid reaction with suspected sepsis:  This case was discussed with Dr. Julien Nordmann.  The patient was taken to the emergency room for further evaluation possible admission given her obstipation, abdominal bloating, and leukocytosis.  Please see After Visit Summary for patient specific instructions.  Future Appointments  Date Time Provider [place]  08/08/2017  8:00 AM [facility] LAB 3 [facility] None  08/08/2017  8:30 AM Truitt Merle, MD [facility] None  08/08/2017  9:30 AM [facility] G22 [facility] None  08/19/2017 12:00 PM [facility] 2 [facility] [place]  08/21/2017  9:30 AM [facility] LAB 5 [facility] None  08/21/2017 10:00 AM Truitt Merle, MD [facility] None  08/21/2017 11:15 AM [facility] G24 [facility] None  09/04/2017  8:00 AM [facility] LAB 2 [facility] None  09/04/2017  8:30 AM Truitt Merle, MD [facility] None  09/04/2017  9:30 AM [facility] H28 [facility] None  09/18/2017  8:00 AM [facility] LAB 4 [facility] None  09/18/2017  8:30 AM Alla Feeling, NP [facility] None  09/18/2017  9:30 AM [facility] F20 [facility] None  10/22/2017 11:20 AM Skeet Latch, MD [facility]    Orders Placed This Encounter  Procedures  . Culture, blood (routine x 2)       Subjective:   Patient  ID:  Mary [name] is a 83 y.o. (DOB 02/07/1933) female.  Chief Complaint:  Chief Complaint  Patient presents with  . symptom management    abd pain, N/V, constipation    HPI Mary [name] is an 83 year old female with a diagnosis of a metastatic sigmoid colon adenocarcinoma originally diagnosed in January 2018.  The patient scans showed hypermetabolic pulmonary and hepatic lesions most consistent with metastatic colon cancer with a hypermetabolic primary located in the sigmoid colon.  She was noted to have a left renal and left adnexal lesion and persistent moderate left hydronephrosis. A liver biopsy of the left lobe of the liver was completed on 11/11/2016 and returned showing a metastatic adenocarcinoma.  The patient's most recent CT scan completed on 05/19/2017 showed worsening pulmonary and hepatic metastasis with stability noted in the primary sigmoid lesion.  Stable to slight enlargement of a left perinephric mass was noted.  This was believed to be secondary to metastasis.  Associated pelvocaliectasis versus mild hydronephrosis was noted.  Slightly enlarged to borderline left periaortic lymph nodes were noted.  Also noted was a cystic and solid lesion in the left adnexa which was to believed to be of ovarian origin.  The patient was initiated on IV vectibix along with oral Xeloda on 06/06/2017 with vectibix dosed every 2 weeks and Xeloda dosed for 1 week on and one week off.  The patient presented to the office today with a subjective fever, diaphoresis, nausea and vomiting, xerosis, abdominal distention and fatigue.  She reported that she had been moving her bowels  regularly until this past weekend.  She has had no bowel movement since Saturday.  She is having decreased oral intake due to nausea and vomiting.  She has had subjective fever and sweats.  Her temperature today at intake was 98.1.  Her labs today returned showing a WBC of 18.2 with neutrophils at 86.2%.  Her chemistry panel returned  with a glucose of 206, creatinine of 1.4, a bilirubin of 1.25, and AST of 53.  The patient was referred for a KUB earlier today which showed no acute findings.  The patient was scheduled to have restaging CT scans completed during the second week of January.  Medications: I have reviewed the patient's current medications.  Allergies:  Allergies  Allergen Reactions  . Benazepril     COUGH   . Bactrim [Sulfamethoxazole-Trimethoprim] Nausea And Vomiting    Past Medical History:  Diagnosis Date  . Aneurysm of ascending aorta (HCC) 12/03/2016   3.7 cm by echo 10/2016  . Colon cancer [facility])    dx via biospy from colonoscopy-- malignant ---  currently having work-up done w/ dr gross (general surgeon) and coordinate surgery w/ urologsit for renal tumor  . Dysrhythmia    A  Fib  . Essential hypertension 10/31/2016  . GERD (gastroesophageal reflux disease)   . Glaucoma   . Hiatal hernia   . HOH (hard of hearing)    bilateral even w/ hearing aids  . Hyperlipidemia   . Hypertension   . Kidney tumor    left side  . Legally blind in right eye, as defined in [place]    due to macular degeneration  . Macular degeneration   . Nausea    intermittant due to colon mass  . Ovarian cyst   . Persistent atrial fibrillation ([place]) 10/31/2016  . PONV (postoperative nausea and vomiting)   . Wears glasses   . Wears hearing aid    bilateral    Past Surgical History:  Procedure Laterality Date  . BREAST CYST EXCISION  1990   benign  . COLONOSCOPY  09/04/2016  . CYSTOSCOPY WITH RETROGRADE PYELOGRAM, URETEROSCOPY AND STENT PLACEMENT Left 10/10/2016   Procedure: CYSTOSCOPY WITH RETROGRADE PYELOGRAM, DIAGNOSTIC URETEROSCOPY  AND STENT PLACEMENT;  Surgeon: Alexis Frock, MD;  Location: [facility];  Service: Urology;  Laterality: Left;  . ESOPHAGOGASTRODUODENOSCOPY  11/07/2014  . NASAL SINUS SURGERY    . TONSILLECTOMY      Family History  Problem Relation Age of Onset  . Cancer Sister 2         breast cancer   . Cancer Brother 41       lung cancer   . Diabetes Son   . High blood pressure Son   . Peripheral Artery Disease Mother   . High blood pressure Mother     Social History   Socioeconomic History  . Marital status: Widowed    Spouse [name]: Not on file  . Number of children: Not on file  . Years of education: Not on file  . Highest education level: Not on file  Social Needs  . Financial resource strain: Not on file  . Food insecurity - worry: Not on file  . Food insecurity - inability: Not on file  . Transportation needs - medical: Not on file  . Transportation needs - non-medical: Not on file  Occupational History  . Not on file  Tobacco Use  . Smoking status: Never Smoker  . Smokeless tobacco: Never Used  Substance and Sexual Activity  .  Alcohol use: No  . Drug use: No  . Sexual activity: Not on file  Other Topics Concern  . Not on file  Social History Narrative  . Not on file    Past Medical History, Surgical history, Social history, and Family history were reviewed and updated as appropriate.   Please see review of systems for further details on the patient's review from today.   Review of Systems:  Review of Systems  Constitutional: Positive for diaphoresis, fatigue and fever (Subjective fever). Negative for appetite change and chills.  HENT: Negative for trouble swallowing.   Respiratory: Negative for cough, choking, chest tightness and shortness of breath.   Cardiovascular: Negative for chest pain and leg swelling.  Gastrointestinal: Positive for abdominal distention, constipation, nausea and vomiting. Negative for abdominal pain and diarrhea.    Objective:   Physical Exam:  BP 97/76 (BP Location: Left Arm, Patient Position: Sitting)   Pulse (!) 101   Temp 98.1 F (36.7 C) (Oral)   Resp (!) 24   Ht 5\' 6"  (1.676 m)   SpO2 100%   BMI 28.96 kg/m  ECOG: 1   Physical Exam  Constitutional: No distress.  The patient is an elderly  female who appears to be in no apparent distress.  HENT:  Head: Normocephalic and atraumatic.  Right Ear: External ear normal.  Left Ear: External ear normal.  Mouth/Throat: No oropharyngeal exudate.  Mucous membranes appear dry.  Eyes: Right eye exhibits no discharge. Left eye exhibits no discharge. No scleral icterus.  Neck: Normal range of motion. Neck supple.  Cardiovascular: Exam reveals no gallop and no friction rub.  No murmur heard. Mild tachycardia with normal S1 and S2.  Pulmonary/Chest: Effort normal and breath sounds normal. No respiratory distress. She has no wheezes. She has no rales.  Abdominal: Soft. She exhibits distension. Bowel sounds are absent. There is no tenderness. There is no rebound and no guarding.  Musculoskeletal: She exhibits no edema.  Lymphadenopathy:    She has no cervical adenopathy.  Neurological: She is alert. Coordination (The patient is ambulating with the use of a wheelchair.) abnormal.  Skin: Skin is warm and dry. No rash noted. She is not diaphoretic. No erythema.    Lab Review:     Component Value Date/Time   NA 142 08/07/2017 1248   K 4.4 08/07/2017 1248   CL 99 02/26/2017 0000   CO2 26 08/07/2017 1248   GLUCOSE 206 (H) 08/07/2017 1248   BUN 21.5 08/07/2017 1248   CREATININE 1.4 (H) 08/07/2017 1248   CALCIUM 9.6 08/07/2017 1248   PROT 8.1 08/07/2017 1248   ALBUMIN 3.9 08/07/2017 1248   AST 53 (H) 08/07/2017 1248   ALT 48 08/07/2017 1248   ALKPHOS 121 08/07/2017 1248   BILITOT 1.25 (H) 08/07/2017 1248   GFRNONAA 61 02/26/2017 0000   GFRAA 71 02/26/2017 0000       Component Value Date/Time   WBC 18.2 (H) 08/07/2017 1248   WBC 9.3 11/11/2016 1106   RBC 4.94 08/07/2017 1248   RBC 3.98 11/11/2016 1106   HGB 14.9 08/07/2017 1248   HCT 46.3 08/07/2017 1248   PLT 387 08/07/2017 1248   PLT 360 02/26/2017 0000   MCV 93.6 08/07/2017 1248   MCH 30.2 08/07/2017 1248   MCH 29.6 11/11/2016 1106   MCHC 32.2 08/07/2017 1248   MCHC 33.1  11/11/2016 1106   RDW 19.7 (H) 08/07/2017 1248   LYMPHSABS 1.2 08/07/2017 1248   MONOABS 1.3 (H) 08/07/2017 1248  EOSABS 0.0 08/07/2017 1248   EOSABS 0.4 02/26/2017 0000   BASOSABS 0.0 08/07/2017 1248   -------------------------------  Imaging from last 24 hours (if applicable):  Radiology interpretation: Dg Abd 1 View  Result Date: 08/07/2017 CLINICAL DATA:  Constipation and abdominal pain with distention. History of left-sided colon cancer. EXAM: ABDOMEN - 1 VIEW COMPARISON:  CT 05/19/2017 FINDINGS: Gallbladder wall calcifications. No free intraperitoneal air on this supine exam. No gaseous distention of bowel loops. Right hemidiaphragm elevation. Distal gas and stool. IMPRESSION: No acute findings. Electronically Signed   By: Abigail Miyamoto M.D.   On: 08/07/2017 12:34        This case was discussed with Dr. Julien Nordmann. He expressed agreement with my management of this patient.

## 2017-08-07 NOTE — ED Notes (Signed)
ED TO INPATIENT HANDOFF REPORT  Name/Age/Gender Mary [name] 83 y.o. female  Code Status Code Status History    This patient does not have a recorded code status. Please follow your organizational policy for patients in this situation.    Advance Directive Documentation     Most Recent Value  Type of Advance Directive  Healthcare Power of Attorney, Living will  Pre-existing out of facility DNR order (yellow form or pink MOST form)  No data  "MOST" Form in Place?  No data      Home/SNF/Other Home  Chief Complaint wbc  Level of Care/Admitting Diagnosis ED Disposition    ED Disposition Condition Comment   Admit  Hospital Area: [place] [[id number]]  Level of Care: Med-Surg [16]  Diagnosis: Bowel obstruction [facility]) [[id number]]  Admitting Physician: Karmen Bongo [[id number]]  Attending Physician: Karmen Bongo [[id number]]  Estimated length of stay: 3 - 4 days  Certification:: I certify this patient will need inpatient services for at least 2 midnights  PT Class (Do Not Modify): Inpatient [101]  PT Acc Code (Do Not Modify): Private [1]       Medical History Past Medical History:  Diagnosis Date  . Aneurysm of ascending aorta (HCC) 12/03/2016   3.7 cm by echo 10/2016  . Colon cancer [facility])    dx via biospy from colonoscopy-- malignant ---  currently having work-up done w/ dr gross (general surgeon) and coordinate surgery w/ urologsit for renal tumor  . Essential hypertension 10/31/2016  . GERD (gastroesophageal reflux disease)   . Glaucoma   . Hiatal hernia   . HOH (hard of hearing)    bilateral even w/ hearing aids  . Hyperlipidemia   . Kidney tumor    left side  . Legally blind in right eye, as defined in [place]    due to macular degeneration  . Macular degeneration   . Nausea    intermittant due to colon mass  . Ovarian cyst   . Persistent atrial fibrillation ([place]) 10/31/2016   on ASA only  . PONV (postoperative nausea and vomiting)   . Wears glasses    . Wears hearing aid    bilateral    Allergies Allergies  Allergen Reactions  . Benazepril     COUGH   . Bactrim [Sulfamethoxazole-Trimethoprim] Nausea And Vomiting    IV Location/Drains/Wounds Patient Lines/Drains/Airways Status   Active Line/Drains/Airways    Name:   Placement date:   Placement time:   Site:   Days:   Peripheral IV 08/07/17 Right Forearm   08/07/17    1538    Forearm   less than 1   Ureteral Drain/Stent Left ureter 5 Fr.   10/10/16    1222    Left ureter   301   Incision (Closed) 10/10/16 Perineum Left   10/10/16    1213     301          Labs/Imaging Results for orders placed or performed in visit on 08/07/17 (from the past 48 hour(s))  Comprehensive metabolic panel     Status: Abnormal   Collection Time: 08/07/17 12:48 PM  Result Value Ref Range   Sodium 142 136 - 145 mEq/L   Potassium 4.4 3.5 - 5.1 mEq/L   Chloride 104 98 - 109 mEq/L   CO2 26 22 - 29 mEq/L   Glucose 206 (H) 70 - 140 mg/dl    Comment: Glucose reference range is for nonfasting patients. Fasting glucose reference range is 70- 100.  BUN 21.5 7.0 - 26.0 mg/dL   Creatinine 1.4 (H) 0.6 - 1.1 mg/dL   Total Bilirubin 1.25 (H) 0.20 - 1.20 mg/dL   Alkaline Phosphatase 121 40 - 150 U/L   AST 53 (H) 5 - 34 U/L   ALT 48 0 - 55 U/L   Total Protein 8.1 6.4 - 8.3 g/dL   Albumin 3.9 3.5 - 5.0 g/dL   Calcium 9.6 8.4 - 10.4 mg/dL   Anion Gap 13 (H) 3 - 11 mEq/L   EGFR 34 (L) >60 ml/min/1.73 m2    Comment: eGFR is calculated using the CKD-EPI Creatinine Equation (2009)  CBC with Differential     Status: Abnormal   Collection Time: 08/07/17 12:48 PM  Result Value Ref Range   WBC 18.2 (H) 3.9 - 10.3 10e3/uL   NEUT# 15.7 (H) 1.5 - 6.5 10e3/uL   HGB 14.9 11.6 - 15.9 g/dL   HCT 46.3 34.8 - 46.6 %   Platelets 387 145 - 400 10e3/uL   MCV 93.6 79.5 - 101.0 fL   MCH 30.2 25.1 - 34.0 pg   MCHC 32.2 31.5 - 36.0 g/dL   RBC 4.94 3.70 - 5.45 10e6/uL   RDW 19.7 (H) 11.2 - 14.5 %   lymph# 1.2 0.9 - 3.3  10e3/uL   MONO# 1.3 (H) 0.1 - 0.9 10e3/uL   Eosinophils Absolute 0.0 0.0 - 0.5 10e3/uL   Basophils Absolute 0.0 0.0 - 0.1 10e3/uL   NEUT% 86.2 (H) 38.4 - 76.8 %   LYMPH% 6.5 (L) 14.0 - 49.7 %   MONO% 7.1 0.0 - 14.0 %   EOS% 0.0 0.0 - 7.0 %   BASO% 0.2 0.0 - 2.0 %   Dg Abd 1 View  Result Date: 08/07/2017 CLINICAL DATA:  Constipation and abdominal pain with distention. History of left-sided colon cancer. EXAM: ABDOMEN - 1 VIEW COMPARISON:  CT 05/19/2017 FINDINGS: Gallbladder wall calcifications. No free intraperitoneal air on this supine exam. No gaseous distention of bowel loops. Right hemidiaphragm elevation. Distal gas and stool. IMPRESSION: No acute findings. Electronically Signed   By: Abigail Miyamoto M.D.   On: 08/07/2017 12:34   Ct Abdomen Pelvis W Contrast  Result Date: 08/07/2017 CLINICAL DATA:  Leukocytosis, abdominal pain. Metastatic colon cancer. EXAM: CT ABDOMEN AND PELVIS WITH CONTRAST TECHNIQUE: Multidetector CT imaging of the abdomen and pelvis was performed using the standard protocol following bolus administration of intravenous contrast. CONTRAST:  154m ISOVUE-300 IOPAMIDOL (ISOVUE-300) INJECTION 61% COMPARISON:  CT abdomen/pelvis dated 05/19/2017. Partial comparison to CT chest dated 06/06/2017. FINDINGS: Lower chest: Bilateral pulmonary metastases, stable versus mildly improved, including: --8 mm nodule in the medial right lower lobe (series 4/ image 4), previously 10 mm --8 mm nodule inferiorly in the right lower lobe (series 4/ image 27), unchanged --15 x 16 mm left lower lobe nodule (series 4/ image 28), previously 16 x 17 mm Hepatobiliary: 1.6 x 2.9 cm metastasis in the anterior dome of the liver in segment 4A (series 2/ image 9), previously 3.6 x 4.7 cm. 1.2 x 1.7 cm metastasis in segment 3 (series 2/ image 23), previously 2.5 x 3.4 cm. Cholelithiasis, without associated inflammatory changes. Pancreas: Within normal limits. Spleen: Within normal limits. Adrenals/Urinary Tract:  Adrenal glands are within normal limits. 2.4 x 1.8 cm left perirenal mass medial to the left renal vein (series 2/ image 32), previously 2.6 x 2.3 cm, suspicious for perianal metastasis. Right kidney is within normal limits. No hydronephrosis. Bladder is low-lying but otherwise within normal limits. Stomach/Bowel:  Stomach is notable for a small hiatal hernia. No evidence of small bowel obstruction. Normal appendix (series 2/image 66). Left colonic diverticulosis, without evidence of diverticulitis. Dilated colon leading to a low colonic stricture in the region of the prior sigmoid mass, which is no longer visualized (series 2/ image 73). This appearance is compatible with colonic obstruction secondary to the stricture. There is mild wall thickening involving the rectosigmoid colon proximal to the stricture (series 2/images 65 and 77). Vascular/Lymphatic: No evidence of abdominal aortic aneurysm. Atherosclerotic calcifications of the abdominal aorta and branch vessels. 7 mm short axis left para-aortic node (series 2/ image 34), previously 8 mm. Otherwise, no suspicious abdominopelvic lymphadenopathy. Reproductive: Status post hysterectomy. Right ovary is within normal limits. 3.5 x 1.8 cm mixed cystic/solid left ovarian mass (series 2/ image 73), previously 3.5 x 2.5 cm. Other: No abdominopelvic ascites. Musculoskeletal: Degenerative changes of the visualized thoracolumbar spine. IMPRESSION: Low colonic obstruction secondary to a rectosigmoid stricture at the site of the patient's prior colonic mass, which is no longer evident on CT. Mild upstream colonic wall thickening raises the possibility of superimposed colitis. Improving hepatic metastases, as above. Left perirenal metastasis, decreased. Visualized pulmonary metastases at the lung bases are grossly unchanged. 3.5 cm mixed cystic/solid left ovarian mass is mildly decreased. Electronically Signed   By: Julian Hy M.D.   On: 08/07/2017 18:06    Pending  Labs Unresulted Labs (From admission, onward)   None      Vitals/Pain Today's Vitals   08/07/17 1826 08/07/17 1924 08/07/17 1931 08/07/17 2000  BP: 138/70  125/73 132/75  Pulse: 100  91 (!) 102  Resp: 16  16 15   Temp:      TempSrc:      SpO2: 99%  94% 94%  Weight:      Height:      PainSc:  2       Isolation Precautions No active isolations  Medications Medications  ondansetron (ZOFRAN) injection 4 mg (4 mg Intravenous Given 08/07/17 2033)  HYDROmorphone (DILAUDID) injection 0.5 mg (0.5 mg Intravenous Given 08/07/17 2033)  sodium chloride 0.9 % bolus 1,000 mL (0 mLs Intravenous Stopped 08/07/17 1815)  ondansetron (ZOFRAN) injection 4 mg (4 mg Intravenous Given 08/07/17 1633)  sodium chloride 0.9 % bolus 1,000 mL (0 mLs Intravenous Stopped 08/07/17 1708)  fentaNYL (SUBLIMAZE) injection 50 mcg (50 mcg Intravenous Given 08/07/17 1634)  iopamidol (ISOVUE-300) 61 % injection 100 mL (100 mLs Intravenous Contrast Given 08/07/17 1715)  iopamidol (ISOVUE-300) 61 % injection (  Contrast Given 08/07/17 1815)    Mobility walks with device

## 2017-08-07 NOTE — ED Notes (Signed)
Bed: WA02 Expected date:  Expected time:  Means of arrival:  Comments: Pt from cancer center

## 2017-08-07 NOTE — H&P (Signed)
History and Physical    Mary [name] BOF:[id number] DOB: 02/07/1933 DOA: 08/07/2017  PCP:   None Consultants:  Burr Medico - oncology; Tresa Moore - urology Patient coming from: Home - lives alone; NOK: Daughter-in-law, [phone], [phone]  Chief Complaint: Abdominal pain  HPI: Mary [name] is a 83 y.o. female with medical history significant of afib; macular degeneration; HTN; HLD; GERD; AAA; and colon cancer presenting with abdominal pain.  Last Wednesday, she noticed that her bowels were not regular.  Dr. Burr Medico had previously told her if there was a problem to increase the Miralax to BID.  She did that starting Wednesday.  Wednesday to Saturday it still worsened.  Her last BM was Saturday and was quite small.  Her daughter-in-law called in on 12/26; the nurse suggested that they use mag citrate.  "That did her in".  She was up all night throwing up with bilious emesis ever since.  She called her daughter-in-law first thing this AM to let her know.  She called the [facility] and they arranged for a scan and lab work.  They met with the PA and he sent them to the ER.  She has had significant pain and bloating - 3x the size the abdomen was prior.   She had not been able to eat or drink last night or today.  She didn't want to do treatments.  When she had her check with Dr. Burr Medico and the last scan, it was so rapidly aggressive that she started the treatment.  The colon had not changed at that time, just the mets were rapidly progressive - the family was pleased that the colon cancer did not seem to be progressing at that time.   She found out in February that she was terminal.    ED Course: h/o colon cancer - declined surgery but getting treatments.  Now with colonic obstruction from stricture, getting worse.  Surgery is seeing patient now.  Hospitalist service asked to admit.  Review of Systems: As per HPI; otherwise review of systems reviewed and negative.   Ambulatory Status:  Ambulates without  assistance  Past Medical History:  Diagnosis Date  . Aneurysm of ascending aorta (HCC) 12/03/2016   3.7 cm by echo 10/2016  . Colon cancer [facility])    dx via biospy from colonoscopy-- malignant ---  currently having work-up done w/ dr gross (general surgeon) and coordinate surgery w/ urologsit for renal tumor  . Essential hypertension 10/31/2016  . GERD (gastroesophageal reflux disease)   . Glaucoma   . Hiatal hernia   . HOH (hard of hearing)    bilateral even w/ hearing aids  . Hyperlipidemia   . Kidney tumor    left side  . Legally blind in right eye, as defined in [place]    due to macular degeneration  . Macular degeneration   . Nausea    intermittant due to colon mass  . Ovarian cyst   . Persistent atrial fibrillation ([place]) 10/31/2016   on ASA only  . PONV (postoperative nausea and vomiting)   . Wears glasses   . Wears hearing aid    bilateral    Past Surgical History:  Procedure Laterality Date  . BREAST CYST EXCISION  1990   benign  . COLONOSCOPY  09/04/2016  . CYSTOSCOPY WITH RETROGRADE PYELOGRAM, URETEROSCOPY AND STENT PLACEMENT Left 10/10/2016   Procedure: CYSTOSCOPY WITH RETROGRADE PYELOGRAM, DIAGNOSTIC URETEROSCOPY  AND STENT PLACEMENT;  Surgeon: Alexis Frock, MD;  Location: [facility];  Service:  Urology;  Laterality: Left;  . ESOPHAGOGASTRODUODENOSCOPY  11/07/2014  . NASAL SINUS SURGERY    . TONSILLECTOMY      Social History   Socioeconomic History  . Marital status: Widowed    Spouse name: Not on file  . Number of children: Not on file  . Years of education: Not on file  . Highest education level: Not on file  Social Needs  . Financial resource strain: Not on file  . Food insecurity - worry: Not on file  . Food insecurity - inability: Not on file  . Transportation needs - medical: Not on file  . Transportation needs - non-medical: Not on file  Occupational History  . Not on file  Tobacco Use  . Smoking status: Never Smoker  . Smokeless  tobacco: Never Used  Substance and Sexual Activity  . Alcohol use: No  . Drug use: No  . Sexual activity: Not on file  Other Topics Concern  . Not on file  Social History Narrative  . Not on file    Allergies  Allergen Reactions  . Benazepril     COUGH   . Bactrim [Sulfamethoxazole-Trimethoprim] Nausea And Vomiting    Family History  Problem Relation Age of Onset  . Cancer Sister 38       breast cancer   . Cancer Brother 97       lung cancer   . Diabetes Son   . High blood pressure Son   . Peripheral Artery Disease Mother   . High blood pressure Mother     Prior to Admission medications   Medication Sig Start Date End Date Taking? Authorizing Provider  amLODipine (NORVASC) 5 MG tablet Take 1 tablet (5 mg total) by mouth daily. 03/04/17  Yes Skeet Latch, MD  clindamycin (CLINDAGEL) 1 % gel Apply topically 2 (two) times daily. 07/24/17  Yes Truitt Merle, MD  diltiazem (CARDIZEM CD) 240 MG 24 hr capsule Take 1 capsule (240 mg total) by mouth daily. 05/28/17  Yes Skeet Latch, MD  diphenhydrAMINE (BENADRYL) 25 MG tablet Take 25 mg by mouth daily.   Yes [provider]  escitalopram (LEXAPRO) 10 MG tablet Take 10 mg by mouth daily.   Yes [provider]  fexofenadine (ALLEGRA) 180 MG tablet Take 180 mg by mouth daily.   Yes [provider]  latanoprost (XALATAN) 0.005 % ophthalmic solution Place 1 drop into both eyes at bedtime.   Yes [provider]  losartan (COZAAR) 100 MG tablet Take 1 tablet (100 mg total) by mouth daily. 06/10/17  Yes Skeet Latch, MD  magnesium citrate SOLN Take 1 Bottle by mouth once.   Yes [provider]  Multiple Vitamins-Minerals (VITEYES AREDS ADVANCED) CAPS Take 1 capsule by mouth daily.   Yes [provider]  omeprazole (PRILOSEC) 40 MG capsule Take 40 mg by mouth daily.   Yes [provider]  ondansetron (ZOFRAN ODT) 4 MG disintegrating tablet Take 1 tablet (4 mg total)  by mouth every 8 (eight) hours as needed for nausea or vomiting. 07/09/17  Yes Truitt Merle, MD  traMADol (ULTRAM) 50 MG tablet Take 1 tablet (50 mg total) by mouth 3 (three) times daily as needed for moderate pain or severe pain. For increased pain. 07/24/17  Yes Truitt Merle, MD  capecitabine (XELODA) 500 MG tablet Take 4 tablets (2,000 mg total) by mouth 2 (two) times daily after a meal. Take for 7 days then off 7 days 07/09/17   Truitt Merle, MD  furosemide (LASIX) 40 MG tablet Take 40 mg by mouth daily as needed for edema.    [provider]  metoprolol tartrate (LOPRESSOR) 25 MG tablet 1/2 TABLET BY MOUTH AS NEEDED FOR PALPITATIONS 07/08/17   Skeet Latch, MD  Polyethyl Glycol-Propyl Glycol (SYSTANE) 0.4-0.3 % SOLN Apply 1 drop to eye daily as needed (dry eyes).     [provider]  potassium chloride SA (K-DUR,KLOR-CON) 20 MEQ tablet Take 1 tablet (20 mEq total) by mouth once for 1 dose. 07/24/17 07/24/17  Truitt Merle, MD    Physical Exam: Vitals:   08/07/17 2000 08/07/17 2157 08/08/17 0100 08/08/17 0123  BP: 132/75     Pulse: (!) 102  (!) 105 96  Resp: 15 17 18    Temp:      TempSrc:      SpO2: 94% 92% (!) 88% 94%  Weight:      Height:         General:  Appears calm and comfortable and is NAD Eyes:  PERRL, EOMI, normal lids, iris ENT: very hard of hearing, normal lips & tongue, mmm Neck:  no LAD, masses or thyromegaly Cardiovascular:  RRR, no m/r/g. 1+ LE edema.  Respiratory:   CTA bilaterally with no wheezes/rales/rhonchi.  Normal respiratory effort. Abdomen:  Distended, taut, very tender to palpation diffusely, hypoactive BS Skin:  no rash or induration seen on limited exam Musculoskeletal:  grossly normal tone BUE/BLE, good ROM, no bony abnormality Psychiatric:  grossly normal mood and affect, speech fluent and appropriate, AOx3 Neurologic:  CN 2-12 grossly intact, moves all extremities in coordinated fashion, sensation intact    Radiological Exams on  Admission: Dg Abd 1 View  Result Date: 08/07/2017 CLINICAL DATA:  Constipation and abdominal pain with distention. History of left-sided colon cancer. EXAM: ABDOMEN - 1 VIEW COMPARISON:  CT 05/19/2017 FINDINGS: Gallbladder wall calcifications. No free intraperitoneal air on this supine exam. No gaseous distention of bowel loops. Right hemidiaphragm elevation. Distal gas and stool. IMPRESSION: No acute findings. Electronically Signed   By: Abigail Miyamoto M.D.   On: 08/07/2017 12:34   Ct Abdomen Pelvis W Contrast  Result Date: 08/07/2017 CLINICAL DATA:  Leukocytosis, abdominal pain. Metastatic colon cancer. EXAM: CT ABDOMEN AND PELVIS WITH CONTRAST TECHNIQUE: Multidetector CT imaging of the abdomen and pelvis was performed using the standard protocol following bolus administration of intravenous contrast. CONTRAST:  176m ISOVUE-300 IOPAMIDOL (ISOVUE-300) INJECTION 61% COMPARISON:  CT abdomen/pelvis dated 05/19/2017. Partial comparison to CT chest dated 06/06/2017. FINDINGS: Lower chest: Bilateral pulmonary metastases, stable versus mildly improved, including: --8 mm nodule in the medial right lower lobe (series 4/ image 4), previously 10 mm --8 mm nodule inferiorly in the right lower lobe (series 4/ image 27), unchanged --15 x 16 mm left lower lobe nodule (series 4/ image 28), previously 16 x 17 mm Hepatobiliary: 1.6 x 2.9 cm metastasis in the anterior dome of the liver in segment 4A (series 2/ image 9), previously 3.6 x 4.7 cm. 1.2 x 1.7 cm metastasis in segment 3 (series 2/ image 23), previously 2.5 x 3.4 cm. Cholelithiasis, without associated inflammatory changes. Pancreas: Within normal limits. Spleen: Within normal limits. Adrenals/Urinary Tract: Adrenal glands are within normal limits. 2.4 x 1.8 cm left perirenal mass medial to the left renal vein (series 2/ image 32), previously 2.6 x 2.3 cm, suspicious for perianal metastasis. Right kidney is within normal limits. No hydronephrosis. Bladder is  low-lying but otherwise within normal limits. Stomach/Bowel: Stomach is notable for a small hiatal hernia.  No evidence of small bowel obstruction. Normal appendix (series 2/image 66). Left colonic diverticulosis, without evidence of diverticulitis. Dilated colon leading to a low colonic stricture in the region of the prior sigmoid mass, which is no longer visualized (series 2/ image 73). This appearance is compatible with colonic obstruction secondary to the stricture. There is mild wall thickening involving the rectosigmoid colon proximal to the stricture (series 2/images 65 and 77). Vascular/Lymphatic: No evidence of abdominal aortic aneurysm. Atherosclerotic calcifications of the abdominal aorta and branch vessels. 7 mm short axis left para-aortic node (series 2/ image 34), previously 8 mm. Otherwise, no suspicious abdominopelvic lymphadenopathy. Reproductive: Status post hysterectomy. Right ovary is within normal limits. 3.5 x 1.8 cm mixed cystic/solid left ovarian mass (series 2/ image 73), previously 3.5 x 2.5 cm. Other: No abdominopelvic ascites. Musculoskeletal: Degenerative changes of the visualized thoracolumbar spine. IMPRESSION: Low colonic obstruction secondary to a rectosigmoid stricture at the site of the patient's prior colonic mass, which is no longer evident on CT. Mild upstream colonic wall thickening raises the possibility of superimposed colitis. Improving hepatic metastases, as above. Left perirenal metastasis, decreased. Visualized pulmonary metastases at the lung bases are grossly unchanged. 3.5 cm mixed cystic/solid left ovarian mass is mildly decreased. Electronically Signed   By: Julian Hy M.D.   On: 08/07/2017 18:06    EKG:  Not done   Labs on Admission: I have personally reviewed the available labs and imaging studies at the time of the admission.  Pertinent labs:   Glucose 206 BUN 22/Creatinine 1.4/GFR 34; prior 10.7/0.9/>60 on 12/13 AST 53/ALT 48; prior 20/13 on  12/13 Bili 1.25; prior 0.68 on 12/13 WBC 18.2 CEA 8.02 on 11/28  Assessment/Plan Principal Problem:   Colonic obstruction (HCC) Active Problems:   Cancer of left colon (HCC)   Persistent atrial fibrillation (HCC)   Essential hypertension   Goals of care, counseling/discussion   Hyperglycemia   Colonic obstruction -Patient with known colon cancer presenting with subacute onset of abdominal pain with n/v, abdominal distention, and CT findings c/w stricture/obstruction -Will admit on Med Surg -Gen Surg consulted by ER saw patient; currently no indication for surgical intervention overnight but she is almost certain to need intervention in the next 1-2 days -Will ask GI to see the patient in the AM to determine if the patient is a candidate for a stent, since this would be less invasive -NPO for bowel rest -NG tube if she develops refractory n/v -IVF hydration -Pain control with reduced dose Dilaudid PCA  Colon CA -Patient with metastatic sigmoid colon adenocarcinoma -She has not progressed with the mets since starting therapy but is not really improving -Based on this information, she appears to be unlikely to continue treatment -Dr. Burr Medico added to treatment team -Palliative care consult requested; she is likely a candidate for hospice at this time  PAF -Rate controlled on Cardizem, will continue -ASA instead of AC  HTN -Borderline low BP in the ER - hold Norvasc and Lopressor -Continue Cardizem for rate control as above  Hyperglycemia -This may be an acute phase reactant (like her leukocytosis) but regardless she is unlikely to suffer the short- or long-term consequences associated with DM and so will follow with fasting labs for now without intervention  Goals of care -Discussion with both daughter-in-law and patient -She prefers to be DNR -She is likely to refuse further oncologic treatments -She is willing to [facility] stenting and/or surgery as needed due to the severity  of her discomfort  DVT prophylaxis:  SCDs Code Status:  DNR - confirmed with patient/family Family Communication: Daughter-in-law present throughout hospitalization  Disposition Plan:  To be determined Consults called: Surgery has seen; GI in AM; Palliative care; oncology added to treatment team; CM Admission status: Admit - It is my clinical opinion that admission to INPATIENT is reasonable and necessary because this patient will require at least 2 midnights in the hospital to treat this condition based on the medical complexity of the problems presented.  Given the aforementioned information, the predictability of an adverse outcome is felt to be significant.    Karmen Bongo MD [facility]  If note is complete, please contact covering daytime or nighttime physician. [email or web address] Password [email or web address]  08/08/2017, 1:50 AM

## 2017-08-07 NOTE — ED Provider Notes (Signed)
[facility] DEPT Provider Note   CSN: [id number] Arrival date & time: 08/07/17  1446     History   Chief Complaint Chief Complaint  Patient presents with  . increase WBC  . Abdominal Pain from metastatic colon cancer    HPI Mary [name] is a 83 y.o. female.  HPI  83 year old female with a history of colon cancer presents with abdominal pain and constipation.  She states that she last had a bowel movement about 5 days ago but the constipation started a little before that.  She has been having on and off vomiting. She has tried to increase MiraLAX at the suggestion of her doctor.  Yesterday she tried magnesium citrate as well after her doctor advised her of this.  Since then she has been feeling significantly worse and has had increased bloating and multiple episodes of emesis.  Is having diffuse abdominal pain but worst in her umbilicus and lower abdomen.  Denies any fevers.  No chest pain or shortness of breath.  She is passing gas but has not had any bowel movements over the last 5 days.  She does not feel an urge to have a bowel movement.  Past Medical History:  Diagnosis Date  . Aneurysm of ascending aorta (HCC) 12/03/2016   3.7 cm by echo 10/2016  . Colon cancer [facility])    dx via biospy from colonoscopy-- malignant ---  currently having work-up done w/ dr gross (general surgeon) and coordinate surgery w/ urologsit for renal tumor  . Essential hypertension 10/31/2016  . GERD (gastroesophageal reflux disease)   . Glaucoma   . Hiatal hernia   . HOH (hard of hearing)    bilateral even w/ hearing aids  . Hyperlipidemia   . Kidney tumor    left side  . Legally blind in right eye, as defined in [place]    due to macular degeneration  . Macular degeneration   . Nausea    intermittant due to colon mass  . Ovarian cyst   . Persistent atrial fibrillation ([place]) 10/31/2016  . PONV (postoperative nausea and vomiting)   . Wears glasses   . Wears hearing aid     bilateral    Patient Active Problem List   Diagnosis Date Noted  . Goals of care, counseling/discussion 12/05/2016  . Aneurysm of ascending aorta (HCC) 12/03/2016  . Persistent atrial fibrillation ([place]) 10/31/2016  . Essential hypertension 10/31/2016  . Hyperlipidemia 10/31/2016  . Cancer of left colon ([place]) 10/25/2016    Past Surgical History:  Procedure Laterality Date  . BREAST CYST EXCISION  1990   benign  . COLONOSCOPY  09/04/2016  . CYSTOSCOPY WITH RETROGRADE PYELOGRAM, URETEROSCOPY AND STENT PLACEMENT Left 10/10/2016   Procedure: CYSTOSCOPY WITH RETROGRADE PYELOGRAM, DIAGNOSTIC URETEROSCOPY  AND STENT PLACEMENT;  Surgeon: Alexis Frock, MD;  Location: [facility];  Service: Urology;  Laterality: Left;  . ESOPHAGOGASTRODUODENOSCOPY  11/07/2014  . NASAL SINUS SURGERY    . TONSILLECTOMY      OB History    No data available       Home Medications    Prior to Admission medications   Medication Sig Start Date End Date Taking? Authorizing Provider  amLODipine (NORVASC) 5 MG tablet Take 1 tablet (5 mg total) by mouth daily. 03/04/17  Yes Skeet Latch, MD  clindamycin (CLINDAGEL) 1 % gel Apply topically 2 (two) times daily. 07/24/17  Yes Truitt Merle, MD  diltiazem (CARDIZEM CD) 240 MG 24 hr capsule Take 1  capsule (240 mg total) by mouth daily. 05/28/17  Yes Skeet Latch, MD  diphenhydrAMINE (BENADRYL) 25 MG tablet Take 25 mg by mouth daily.   Yes [provider]  escitalopram (LEXAPRO) 10 MG tablet Take 10 mg by mouth daily.   Yes [provider]  fexofenadine (ALLEGRA) 180 MG tablet Take 180 mg by mouth daily.   Yes [provider]  latanoprost (XALATAN) 0.005 % ophthalmic solution Place 1 drop into both eyes at bedtime.   Yes [provider]  losartan (COZAAR) 100 MG tablet Take 1 tablet (100 mg total) by mouth daily. 06/10/17  Yes Skeet Latch, MD  magnesium citrate SOLN Take 1 Bottle by mouth once.   Yes  [provider]  Multiple Vitamins-Minerals (VITEYES AREDS ADVANCED) CAPS Take 1 capsule by mouth daily.   Yes [provider]  omeprazole (PRILOSEC) 40 MG capsule Take 40 mg by mouth daily.   Yes [provider]  ondansetron (ZOFRAN ODT) 4 MG disintegrating tablet Take 1 tablet (4 mg total) by mouth every 8 (eight) hours as needed for nausea or vomiting. 07/09/17  Yes Truitt Merle, MD  traMADol (ULTRAM) 50 MG tablet Take 1 tablet (50 mg total) by mouth 3 (three) times daily as needed for moderate pain or severe pain. For increased pain. 07/24/17  Yes Truitt Merle, MD  capecitabine (XELODA) 500 MG tablet Take 4 tablets (2,000 mg total) by mouth 2 (two) times daily after a meal. Take for 7 days then off 7 days 07/09/17   Truitt Merle, MD  furosemide (LASIX) 40 MG tablet Take 40 mg by mouth daily as needed for edema.    [provider]  metoprolol tartrate (LOPRESSOR) 25 MG tablet 1/2 TABLET BY MOUTH AS NEEDED FOR PALPITATIONS 07/08/17   Skeet Latch, MD  Polyethyl Glycol-Propyl Glycol (SYSTANE) 0.4-0.3 % SOLN Apply 1 drop to eye daily as needed (dry eyes).     [provider]  potassium chloride SA (K-DUR,KLOR-CON) 20 MEQ tablet Take 1 tablet (20 mEq total) by mouth once for 1 dose. 07/24/17 07/24/17  Truitt Merle, MD    Family History Family History  Problem Relation Age of Onset  . Cancer Sister 37       breast cancer   . Cancer Brother 69       lung cancer   . Diabetes Son   . High blood pressure Son   . Peripheral Artery Disease Mother   . High blood pressure Mother     Social History Social History   Tobacco Use  . Smoking status: Never Smoker  . Smokeless tobacco: Never Used  Substance Use Topics  . Alcohol use: No  . Drug use: No     Allergies   Benazepril and Bactrim [sulfamethoxazole-trimethoprim]   Review of Systems Review of Systems  Constitutional: Negative for fever.  Respiratory: Negative for shortness of breath.     Gastrointestinal: Positive for abdominal distention, abdominal pain, constipation, nausea and vomiting. Negative for rectal pain.  All other systems reviewed and are negative.    Physical Exam Updated Vital Signs BP 138/70 (BP Location: Right Arm)   Pulse 100   Temp 98.2 F (36.8 C) (Oral)   Resp 16   Ht 5\' 6"  (1.676 m)   Wt 81.2 kg (179 lb)   SpO2 99%   BMI 28.89 kg/m   Physical Exam  Constitutional: She is oriented to person, place, and time. She appears well-developed and well-nourished. No distress.  HENT:  Head: Normocephalic  and atraumatic.  Right Ear: External ear normal.  Left Ear: External ear normal.  Nose: Nose normal.  Eyes: Right eye exhibits no discharge. Left eye exhibits no discharge.  Cardiovascular: Normal rate, regular rhythm and normal heart sounds.  Pulmonary/Chest: Effort normal and breath sounds normal.  Abdominal: Soft. She exhibits distension. There is tenderness in the right lower quadrant, periumbilical area, suprapubic area and left lower quadrant. No hernia.  Neurological: She is alert and oriented to person, place, and time.  Skin: Skin is warm and dry. She is not diaphoretic.  Nursing note and vitals reviewed.    ED Treatments / Results  Labs (all labs ordered are listed, but only abnormal results are displayed) Labs Reviewed - No data to display  EKG  EKG Interpretation None       Radiology Dg Abd 1 View  Result Date: 08/07/2017 CLINICAL DATA:  Constipation and abdominal pain with distention. History of left-sided colon cancer. EXAM: ABDOMEN - 1 VIEW COMPARISON:  CT 05/19/2017 FINDINGS: Gallbladder wall calcifications. No free intraperitoneal air on this supine exam. No gaseous distention of bowel loops. Right hemidiaphragm elevation. Distal gas and stool. IMPRESSION: No acute findings. Electronically Signed   By: Abigail Miyamoto M.D.   On: 08/07/2017 12:34   Ct Abdomen Pelvis W Contrast  Result Date: 08/07/2017 CLINICAL DATA:   Leukocytosis, abdominal pain. Metastatic colon cancer. EXAM: CT ABDOMEN AND PELVIS WITH CONTRAST TECHNIQUE: Multidetector CT imaging of the abdomen and pelvis was performed using the standard protocol following bolus administration of intravenous contrast. CONTRAST:  168mL ISOVUE-300 IOPAMIDOL (ISOVUE-300) INJECTION 61% COMPARISON:  CT abdomen/pelvis dated 05/19/2017. Partial comparison to CT chest dated 06/06/2017. FINDINGS: Lower chest: Bilateral pulmonary metastases, stable versus mildly improved, including: --8 mm nodule in the medial right lower lobe (series 4/ image 4), previously 10 mm --8 mm nodule inferiorly in the right lower lobe (series 4/ image 27), unchanged --15 x 16 mm left lower lobe nodule (series 4/ image 28), previously 16 x 17 mm Hepatobiliary: 1.6 x 2.9 cm metastasis in the anterior dome of the liver in segment 4A (series 2/ image 9), previously 3.6 x 4.7 cm. 1.2 x 1.7 cm metastasis in segment 3 (series 2/ image 23), previously 2.5 x 3.4 cm. Cholelithiasis, without associated inflammatory changes. Pancreas: Within normal limits. Spleen: Within normal limits. Adrenals/Urinary Tract: Adrenal glands are within normal limits. 2.4 x 1.8 cm left perirenal mass medial to the left renal vein (series 2/ image 32), previously 2.6 x 2.3 cm, suspicious for perianal metastasis. Right kidney is within normal limits. No hydronephrosis. Bladder is low-lying but otherwise within normal limits. Stomach/Bowel: Stomach is notable for a small hiatal hernia. No evidence of small bowel obstruction. Normal appendix (series 2/image 66). Left colonic diverticulosis, without evidence of diverticulitis. Dilated colon leading to a low colonic stricture in the region of the prior sigmoid mass, which is no longer visualized (series 2/ image 73). This appearance is compatible with colonic obstruction secondary to the stricture. There is mild wall thickening involving the rectosigmoid colon proximal to the stricture (series  2/images 65 and 77). Vascular/Lymphatic: No evidence of abdominal aortic aneurysm. Atherosclerotic calcifications of the abdominal aorta and branch vessels. 7 mm short axis left para-aortic node (series 2/ image 34), previously 8 mm. Otherwise, no suspicious abdominopelvic lymphadenopathy. Reproductive: Status post hysterectomy. Right ovary is within normal limits. 3.5 x 1.8 cm mixed cystic/solid left ovarian mass (series 2/ image 73), previously 3.5 x 2.5 cm. Other: No abdominopelvic ascites. Musculoskeletal: Degenerative changes  of the visualized thoracolumbar spine. IMPRESSION: Low colonic obstruction secondary to a rectosigmoid stricture at the site of the patient's prior colonic mass, which is no longer evident on CT. Mild upstream colonic wall thickening raises the possibility of superimposed colitis. Improving hepatic metastases, as above. Left perirenal metastasis, decreased. Visualized pulmonary metastases at the lung bases are grossly unchanged. 3.5 cm mixed cystic/solid left ovarian mass is mildly decreased. Electronically Signed   By: Julian Hy M.D.   On: 08/07/2017 18:06    Procedures Procedures (including critical care time)  Medications Ordered in ED Medications  sodium chloride 0.9 % bolus 1,000 mL (0 mLs Intravenous Stopped 08/07/17 1815)  ondansetron (ZOFRAN) injection 4 mg (4 mg Intravenous Given 08/07/17 1633)  sodium chloride 0.9 % bolus 1,000 mL (0 mLs Intravenous Stopped 08/07/17 1708)  fentaNYL (SUBLIMAZE) injection 50 mcg (50 mcg Intravenous Given 08/07/17 1634)  iopamidol (ISOVUE-300) 61 % injection 100 mL (100 mLs Intravenous Contrast Given 08/07/17 1715)  iopamidol (ISOVUE-300) 61 % injection (  Contrast Given 08/07/17 1815)     Initial Impression / Assessment and Plan / ED Course  I have reviewed the triage vital signs and the nursing notes.  Pertinent labs & imaging results that were available during my care of the patient were reviewed by me and considered  in my medical decision making (see chart for details).  Clinical Course as of Aug 07 1922  Thu Aug 07, 2017  1817 Discussed CT findings with OR nurse for Dr. Harlow Asa. He will review case after OR and call back.  [SG]    Clinical Course User Index [SG] Sherwood Gambler, MD    Labs from earlier in the day reviewed which show a white blood cell elevation and mild acute kidney injury.  She was given IV fluids.  CT scan shows colonic obstruction, likely from stricture/mass.  Dr. Harlow Asa asks for patient to be n.p.o. and he will see in consultation but she will need medicine admission.  He will talked to her about surgical options and she may need GI consultation.  She is otherwise stable and will need fluids and pain/nausea control.  Admit to the hospitalist.  Final Clinical Impressions(s) / ED Diagnoses   Final diagnoses:  Colon obstruction [facility])    ED Discharge Orders    None       Sherwood Gambler, MD 08/07/17 1924

## 2017-08-08 ENCOUNTER — Other Ambulatory Visit: Payer: Medicare Other

## 2017-08-08 ENCOUNTER — Telehealth: Payer: Self-pay | Admitting: *Deleted

## 2017-08-08 ENCOUNTER — Ambulatory Visit: Payer: Medicare Other | Admitting: Hematology

## 2017-08-08 ENCOUNTER — Ambulatory Visit: Payer: Medicare Other

## 2017-08-08 DIAGNOSIS — R739 Hyperglycemia, unspecified: Secondary | ICD-10-CM | POA: Diagnosis present

## 2017-08-08 DIAGNOSIS — C787 Secondary malignant neoplasm of liver and intrahepatic bile duct: Secondary | ICD-10-CM

## 2017-08-08 DIAGNOSIS — I4891 Unspecified atrial fibrillation: Secondary | ICD-10-CM

## 2017-08-08 DIAGNOSIS — C187 Malignant neoplasm of sigmoid colon: Secondary | ICD-10-CM

## 2017-08-08 DIAGNOSIS — I481 Persistent atrial fibrillation: Secondary | ICD-10-CM

## 2017-08-08 DIAGNOSIS — C7800 Secondary malignant neoplasm of unspecified lung: Secondary | ICD-10-CM

## 2017-08-08 DIAGNOSIS — R52 Pain, unspecified: Secondary | ICD-10-CM

## 2017-08-08 LAB — CBC
HEMATOCRIT: 39.1 % (ref 36.0–46.0)
HEMOGLOBIN: 12.5 g/dL (ref 12.0–15.0)
MCH: 30.4 pg (ref 26.0–34.0)
MCHC: 32 g/dL (ref 30.0–36.0)
MCV: 95.1 fL (ref 78.0–100.0)
Platelets: 332 10*3/uL (ref 150–400)
RBC: 4.11 MIL/uL (ref 3.87–5.11)
RDW: 18.6 % — ABNORMAL HIGH (ref 11.5–15.5)
WBC: 19.6 10*3/uL — ABNORMAL HIGH (ref 4.0–10.5)

## 2017-08-08 LAB — COMPREHENSIVE METABOLIC PANEL
ALBUMIN: 3.2 g/dL — AB (ref 3.5–5.0)
ALK PHOS: 96 U/L (ref 38–126)
ALT: 91 U/L — AB (ref 14–54)
AST: 149 U/L — ABNORMAL HIGH (ref 15–41)
Anion gap: 6 (ref 5–15)
BILIRUBIN TOTAL: 1.5 mg/dL — AB (ref 0.3–1.2)
BUN: 24 mg/dL — ABNORMAL HIGH (ref 6–20)
CALCIUM: 8.1 mg/dL — AB (ref 8.9–10.3)
CO2: 26 mmol/L (ref 22–32)
CREATININE: 0.82 mg/dL (ref 0.44–1.00)
Chloride: 110 mmol/L (ref 101–111)
GFR calc Af Amer: >60 mL/min (ref >60)
GFR calc non Af Amer: >60 mL/min (ref >60)
GLUCOSE: 152 mg/dL — AB (ref 65–99)
Potassium: 3.4 mmol/L — ABNORMAL LOW (ref 3.5–5.1)
SODIUM: 142 mmol/L (ref 135–145)
TOTAL PROTEIN: 6.3 g/dL — AB (ref 6.5–8.1)

## 2017-08-08 MED ORDER — LORAZEPAM 2 MG/ML IJ SOLN
0.5000 mg | Freq: Once | INTRAMUSCULAR | Status: AC
  Administered 2017-08-08: 0.5 mg via INTRAVENOUS
  Filled 2017-08-08: qty 1

## 2017-08-08 MED ORDER — POLYETHYLENE GLYCOL 3350 17 GM/SCOOP PO POWD
1.0000 | Freq: Once | ORAL | Status: AC
  Administered 2017-08-08: 255 g via ORAL
  Filled 2017-08-08: qty 255

## 2017-08-08 NOTE — Consult Note (Addendum)
[facility] Consult  Referring Provider: Dr.Danford([facility]) Primary Care Physician:  Moshe Cipro, MD Primary Gastroenterologist: Althia Forts  Reason for Consultation:  Metastatic Colon cancer, rectosigmoid strticure  HPI: Mary [name] is a 83 y.o. Caucasian female admitted on 08/07/17 with nausea, vomiting and worsening abdominal distension. She was advised to take Magnesium citrate , however, developed worsening of her symptoms and was advised to proceed to the ER.  Colonoscopy 09/02/16 at [place], [place]: moderately differentiated adenocarcinoma starting at 20 cm, tumor length about 10 cm. CT 09/06/16: ?pararenal metastasis Left ureteral stent placement on 10/10/16 MRI 10/15/16 : Hepatic metastasis, ?left kidney metastasis. PET scan 11/06/16: pulmonary and hepatic metastasis Liver biopsy 11/11/16 :metastatic adenocarcinoma Started on oral chemotherapy Xeloda and IV antibody Vectibix on 06/06/17  Declined surgery in the past. As per oncology notes from Dr.Yan Burr Medico, goal of therapy is palliative. Overall prognosis was considered as poor.    Past Medical History:  Diagnosis Date  . Aneurysm of ascending aorta (HCC) 12/03/2016   3.7 cm by echo 10/2016  . Colon cancer [facility])    dx via biospy from colonoscopy-- malignant ---  currently having work-up done w/ dr gross (general surgeon) and coordinate surgery w/ urologsit for renal tumor  . Essential hypertension 10/31/2016  . GERD (gastroesophageal reflux disease)   . Glaucoma   . Hiatal hernia   . HOH (hard of hearing)    bilateral even w/ hearing aids  . Hyperlipidemia   . Kidney tumor    left side  . Legally blind in right eye, as defined in [place]    due to macular degeneration  . Macular degeneration   . Nausea    intermittant due to colon mass  . Ovarian cyst   . Persistent atrial fibrillation ([place]) 10/31/2016   on ASA only  . PONV (postoperative nausea and vomiting)   . Wears glasses   . Wears hearing aid     bilateral    Past Surgical History:  Procedure Laterality Date  . BREAST CYST EXCISION  1990   benign  . COLONOSCOPY  09/04/2016  . CYSTOSCOPY WITH RETROGRADE PYELOGRAM, URETEROSCOPY AND STENT PLACEMENT Left 10/10/2016   Procedure: CYSTOSCOPY WITH RETROGRADE PYELOGRAM, DIAGNOSTIC URETEROSCOPY  AND STENT PLACEMENT;  Surgeon: Alexis Frock, MD;  Location: [facility];  Service: Urology;  Laterality: Left;  . ESOPHAGOGASTRODUODENOSCOPY  11/07/2014  . NASAL SINUS SURGERY    . TONSILLECTOMY      Prior to Admission medications   Medication Sig Start Date End Date Taking? Authorizing Provider  amLODipine (NORVASC) 5 MG tablet Take 1 tablet (5 mg total) by mouth daily. 03/04/17  Yes Skeet Latch, MD  clindamycin (CLINDAGEL) 1 % gel Apply topically 2 (two) times daily. 07/24/17  Yes Truitt Merle, MD  diltiazem (CARDIZEM CD) 240 MG 24 hr capsule Take 1 capsule (240 mg total) by mouth daily. 05/28/17  Yes Skeet Latch, MD  diphenhydrAMINE (BENADRYL) 25 MG tablet Take 25 mg by mouth daily.   Yes [provider]  escitalopram (LEXAPRO) 10 MG tablet Take 10 mg by mouth daily.   Yes [provider]  fexofenadine (ALLEGRA) 180 MG tablet Take 180 mg by mouth daily.   Yes [provider]  latanoprost (XALATAN) 0.005 % ophthalmic solution Place 1 drop into both eyes at bedtime.   Yes [provider]  losartan (COZAAR) 100 MG tablet Take 1 tablet (100 mg total) by mouth daily. 06/10/17  Yes Skeet Latch, MD  Multiple Vitamins-Minerals (VITEYES AREDS ADVANCED)  CAPS Take 1 capsule by mouth daily.   Yes [provider]  omeprazole (PRILOSEC) 40 MG capsule Take 40 mg by mouth daily.   Yes [provider]  ondansetron (ZOFRAN ODT) 4 MG disintegrating tablet Take 1 tablet (4 mg total) by mouth every 8 (eight) hours as needed for nausea or vomiting. 07/09/17  Yes Truitt Merle, MD  traMADol (ULTRAM) 50 MG tablet Take 1 tablet (50 mg total)  by mouth 3 (three) times daily as needed for moderate pain or severe pain. For increased pain. 07/24/17  Yes Truitt Merle, MD  capecitabine (XELODA) 500 MG tablet Take 4 tablets (2,000 mg total) by mouth 2 (two) times daily after a meal. Take for 7 days then off 7 days 07/09/17   Truitt Merle, MD  furosemide (LASIX) 40 MG tablet Take 40 mg by mouth daily as needed for edema.    [provider]  metoprolol tartrate (LOPRESSOR) 25 MG tablet 1/2 TABLET BY MOUTH AS NEEDED FOR PALPITATIONS 07/08/17   Skeet Latch, MD  Polyethyl Glycol-Propyl Glycol (SYSTANE) 0.4-0.3 % SOLN Apply 1 drop to eye daily as needed (dry eyes).     [provider]  potassium chloride SA (K-DUR,KLOR-CON) 20 MEQ tablet Take 1 tablet (20 mEq total) by mouth once for 1 dose. 07/24/17 07/24/17  Truitt Merle, MD    Current Facility-Administered Medications  Medication Dose Route Frequency Provider Last Rate Last Dose  . acetaminophen (TYLENOL) tablet 650 mg  650 mg Oral Q6H PRN Karmen Bongo, MD       Or  . acetaminophen (TYLENOL) suppository 650 mg  650 mg Rectal Q6H PRN Karmen Bongo, MD      . diltiazem (CARDIZEM CD) 24 hr capsule 240 mg  240 mg Oral Daily Karmen Bongo, MD   240 mg at 08/08/17 0916  . diphenhydrAMINE (BENADRYL) injection 12.5 mg  12.5 mg Intravenous Q6H PRN Karmen Bongo, MD       Or  . diphenhydrAMINE (BENADRYL) 12.5 MG/5ML elixir 12.5 mg  12.5 mg Oral Q6H PRN Karmen Bongo, MD      . HYDROmorphone (DILAUDID) 1 mg/mL PCA injection   Intravenous Q4H Karmen Bongo, MD      . lactated ringers infusion   Intravenous Continuous Karmen Bongo, MD 100 mL/hr at 08/08/17 0150    . latanoprost (XALATAN) 0.005 % ophthalmic solution 1 drop  1 drop Both Eyes QHS Karmen Bongo, MD      . metoprolol tartrate (LOPRESSOR) injection 5 mg  5 mg Intravenous Q6H PRN Karmen Bongo, MD      . naloxone [facility]) injection 0.4 mg  0.4 mg Intravenous PRN Karmen Bongo, MD       And  . sodium chloride  flush (NS) 0.9 % injection 9 mL  9 mL Intravenous PRN Karmen Bongo, MD      . ondansetron [facility]) injection 4 mg  4 mg Intravenous Q6H PRN Karmen Bongo, MD   4 mg at 08/07/17 2033  . polyvinyl alcohol (LIQUIFILM TEARS) 1.4 % ophthalmic solution 1 drop  1 drop Both Eyes PRN Karmen Bongo, MD        Allergies as of 08/07/2017 - Review Complete 08/07/2017  Allergen Reaction Noted  . Benazepril  01/08/2017  . Bactrim [sulfamethoxazole-trimethoprim] Nausea And Vomiting 10/03/2016    Family History  Problem Relation Age of Onset  . Cancer Sister 46       breast cancer   . Cancer Brother 27       lung cancer   .  Diabetes Son   . High blood pressure Son   . Peripheral Artery Disease Mother   . High blood pressure Mother     Social History   Socioeconomic History  . Marital status: Widowed    Spouse name: Not on file  . Number of children: Not on file  . Years of education: Not on file  . Highest education level: Not on file  Social Needs  . Financial resource strain: Not on file  . Food insecurity - worry: Not on file  . Food insecurity - inability: Not on file  . Transportation needs - medical: Not on file  . Transportation needs - non-medical: Not on file  Occupational History  . Not on file  Tobacco Use  . Smoking status: Never Smoker  . Smokeless tobacco: Never Used  Substance and Sexual Activity  . Alcohol use: No  . Drug use: No  . Sexual activity: Not on file  Other Topics Concern  . Not on file  Social History Narrative  . Not on file    Review of Systems: Positive for: GI: Described in detail in HPI.    Gen:  anorexia, fatigue, weakness, malaise,Denies any fever, chills, rigors, night sweats, involuntary weight loss, and sleep disorder CV: Denies chest pain, angina, palpitations, syncope, orthopnea, PND, peripheral edema, and claudication. Resp: Denies dyspnea, cough, sputum, wheezing, coughing up blood. GU : Denies urinary burning, blood in urine,  urinary frequency, urinary hesitancy, nocturnal urination, and urinary incontinence. MS: Denies joint pain or swelling.  Denies muscle weakness, cramps, atrophy.  Derm: Denies rash, itching, oral ulcerations, hives, unhealing ulcers.  Psych: Denies depression, anxiety, memory loss, suicidal ideation, hallucinations,  and confusion. Heme: Denies bruising, bleeding, and enlarged lymph nodes. Neuro:  Denies any headaches, dizziness, paresthesias. Endo:  Denies any problems with DM, thyroid, adrenal function.  Physical Exam: Vital signs in last 24 hours: Temp:  [97.6 F (36.4 C)-98.2 F (36.8 C)] 97.6 F (36.4 C) (12/28 0951) Pulse Rate:  [82-105] 102 (12/28 0951) Resp:  [13-24] 14 (12/28 0951) BP: (97-153)/(70-87) 125/70 (12/28 0951) SpO2:  [88 %-100 %] 93 % (12/28 0951) Weight:  [81.2 kg (179 lb)] 81.2 kg (179 lb) (12/27 1449) Last BM Date: 08/02/17  General:   Alert,  Well-developed, well-nourished, pleasant and cooperative in NAD Head:  Normocephalic and atraumatic. Eyes:  Sclera clear, no icterus.   Conjunctiva pink. Ears:  Normal auditory acuity. Nose:  No deformity, discharge,  or lesions. Mouth:  No deformity or lesions.  Oropharynx pink & moist. Neck:  Supple; no masses or thyromegaly. Lungs:  Clear throughout to auscultation.   No wheezes, crackles, or rhonchi. No acute distress. Heart:  Regular rate and rhythm; no murmurs, clicks, rubs,  or gallops. Extremities:  Without clubbing or edema. Neurologic:  Alert and  oriented x4;  grossly normal neurologically. Skin:  Intact without significant lesions or rashes. Psych:  Alert and cooperative. Normal mood and affect. Abdomen:  Distended, non tender, high-pitched bowel sounds audible intermittently         Lab Results: Recent Labs    08/07/17 1248 08/08/17 0542  WBC 18.2* 19.6*  HGB 14.9 12.5  HCT 46.3 39.1  PLT 387 332   BMET Recent Labs    08/07/17 1248 08/08/17 0542  NA 142 142  K 4.4 3.4*  CL  --  110   CO2 26 26  GLUCOSE 206* 152*  BUN 21.5 24*  CREATININE 1.4* 0.82  CALCIUM 9.6 8.1*   LFT Recent  Labs    08/08/17 0542  PROT 6.3*  ALBUMIN 3.2*  AST 149*  ALT 91*  ALKPHOS 96  BILITOT 1.5*   PT/INR No results for input(s): LABPROT, INR in the last 72 hours.  Studies/Results: Dg Abd 1 View  Result Date: 08/07/2017 CLINICAL DATA:  Constipation and abdominal pain with distention. History of left-sided colon cancer. EXAM: ABDOMEN - 1 VIEW COMPARISON:  CT 05/19/2017 FINDINGS: Gallbladder wall calcifications. No free intraperitoneal air on this supine exam. No gaseous distention of bowel loops. Right hemidiaphragm elevation. Distal gas and stool. IMPRESSION: No acute findings. Electronically Signed   By: Abigail Miyamoto M.D.   On: 08/07/2017 12:34   Ct Abdomen Pelvis W Contrast  Result Date: 08/07/2017 CLINICAL DATA:  Leukocytosis, abdominal pain. Metastatic colon cancer. EXAM: CT ABDOMEN AND PELVIS WITH CONTRAST TECHNIQUE: Multidetector CT imaging of the abdomen and pelvis was performed using the standard protocol following bolus administration of intravenous contrast. CONTRAST:  151mL ISOVUE-300 IOPAMIDOL (ISOVUE-300) INJECTION 61% COMPARISON:  CT abdomen/pelvis dated 05/19/2017. Partial comparison to CT chest dated 06/06/2017. FINDINGS: Lower chest: Bilateral pulmonary metastases, stable versus mildly improved, including: --8 mm nodule in the medial right lower lobe (series 4/ image 4), previously 10 mm --8 mm nodule inferiorly in the right lower lobe (series 4/ image 27), unchanged --15 x 16 mm left lower lobe nodule (series 4/ image 28), previously 16 x 17 mm Hepatobiliary: 1.6 x 2.9 cm metastasis in the anterior dome of the liver in segment 4A (series 2/ image 9), previously 3.6 x 4.7 cm. 1.2 x 1.7 cm metastasis in segment 3 (series 2/ image 23), previously 2.5 x 3.4 cm. Cholelithiasis, without associated inflammatory changes. Pancreas: Within normal limits. Spleen: Within normal limits.  Adrenals/Urinary Tract: Adrenal glands are within normal limits. 2.4 x 1.8 cm left perirenal mass medial to the left renal vein (series 2/ image 32), previously 2.6 x 2.3 cm, suspicious for perianal metastasis. Right kidney is within normal limits. No hydronephrosis. Bladder is low-lying but otherwise within normal limits. Stomach/Bowel: Stomach is notable for a small hiatal hernia. No evidence of small bowel obstruction. Normal appendix (series 2/image 66). Left colonic diverticulosis, without evidence of diverticulitis. Dilated colon leading to a low colonic stricture in the region of the prior sigmoid mass, which is no longer visualized (series 2/ image 73). This appearance is compatible with colonic obstruction secondary to the stricture. There is mild wall thickening involving the rectosigmoid colon proximal to the stricture (series 2/images 65 and 77). Vascular/Lymphatic: No evidence of abdominal aortic aneurysm. Atherosclerotic calcifications of the abdominal aorta and branch vessels. 7 mm short axis left para-aortic node (series 2/ image 34), previously 8 mm. Otherwise, no suspicious abdominopelvic lymphadenopathy. Reproductive: Status post hysterectomy. Right ovary is within normal limits. 3.5 x 1.8 cm mixed cystic/solid left ovarian mass (series 2/ image 73), previously 3.5 x 2.5 cm. Other: No abdominopelvic ascites. Musculoskeletal: Degenerative changes of the visualized thoracolumbar spine. IMPRESSION: Low colonic obstruction secondary to a rectosigmoid stricture at the site of the patient's prior colonic mass, which is no longer evident on CT. Mild upstream colonic wall thickening raises the possibility of superimposed colitis. Improving hepatic metastases, as above. Left perirenal metastasis, decreased. Visualized pulmonary metastases at the lung bases are grossly unchanged. 3.5 cm mixed cystic/solid left ovarian mass is mildly decreased. Electronically Signed   By: Julian Hy M.D.   On:  08/07/2017 18:06    Impression: 1. Colonic obstruction secondary to a rectosigmoid stricture at the site of patient's  known colonic mass 2. Metastatic colon cancer(liver, lungs, kidney) 3. Leukocytosis, abnormal LFTs  Plan: Discuss imaging with radiologist, Dr. [place] Pink, stricture appears to be about 3 cm long and is at the rectosigmoid.  I had a detailed discussion with the patient and her daughter-in-law at bedside in presence of patient's hospitalist Dr. Loleta Books. Patient and her family understand that placement of colonic stent is planned only for palliation and not for cure.  I also spoke with Dr.Yan Burr Medico (Oncology) and it seems that patient's care is largely palliative. Patient at this point doesn't want to continue with chemotherapy.  The risk of the procedure were explained to the patient and her family in details. They understand that colonic stent placement may be associated with risk of perforation, stent migration, bleeding and even death.  I will start the patient on sips of clear water with use of MiraLAX, she has been passing some flatus, however adequate bowel prep may not be possible due to underlying stricture.     LOS: 1 day   Ronnette Juniper, M.D.  08/08/2017, 10:23 AM  Pager [phone] If no answer or after 5 PM call [phone]

## 2017-08-08 NOTE — Progress Notes (Signed)
CARIA TRANSUE   DOB:02/07/1933   TA#:[id number]   KPV#:[id number]  ONCOLOGY F/U NOTE   Subjective: Patient is well-known to me, under my care for her metastatic colon cancer.  She is on palliative chemotherapy, last treatment 2 weeks ago.  She was admitted from our office yesterday due to bowel obstruction, she is feeling better now.  She is quite drowsy when I saw her, I spoke with her daughter in law at bedside.  Objective:  Vitals:   08/08/17 1158 08/08/17 1412  BP:  127/68  Pulse:  87  Resp: 14 14  Temp:  (!) 97.5 F (36.4 C)  SpO2: 94% 94%    Body mass index is 28.89 kg/m.  Intake/Output Summary (Last 24 hours) at 08/08/2017 1426 Last data filed at 08/08/2017 0800 Gross per 24 hour  Intake 2983.33 ml  Output -  Net 2983.33 ml     Sclerae unicteric  Oropharynx clear  No peripheral adenopathy  Lungs clear -- no rales or rhonchi  Heart regular rate and rhythm  Abdomen soft, mild diffuse tenderness   MSK no focal spinal tenderness, no peripheral edema  Neuro nonfocal   CBG (last 3)  No results for input(s): GLUCAP in the last 72 hours.   Labs:  Lab Results  Component Value Date   WBC 19.6 (H) 08/08/2017   HGB 12.5 08/08/2017   HCT 39.1 08/08/2017   MCV 95.1 08/08/2017   PLT 332 08/08/2017   NEUTROABS 15.7 (H) 08/07/2017   CMP Latest Ref Rng & Units 08/08/2017 08/07/2017 07/24/2017  Glucose 65 - 99 mg/dL 152(H) 206(H) 83  BUN 6 - 20 mg/dL 24(H) 21.5 10.7  Creatinine 0.44 - 1.00 mg/dL 0.82 1.4(H) 0.9  Sodium 135 - 145 mmol/L 142 142 139  Potassium 3.5 - 5.1 mmol/L 3.4(L) 4.4 3.3(L)  Chloride 101 - 111 mmol/L 110 - -  CO2 22 - 32 mmol/L 26 26 24   Calcium 8.9 - 10.3 mg/dL 8.1(L) 9.6 8.9  Total Protein 6.5 - 8.1 g/dL 6.3(L) 8.1 7.8  Total Bilirubin 0.3 - 1.2 mg/dL 1.5(H) 1.25(H) 0.68  Alkaline Phos 38 - 126 U/L 96 121 111  AST 15 - 41 U/L 149(H) 53(H) 20  ALT 14 - 54 U/L 91(H) 48 13    Urine Studies No results for input(s): UHGB, CRYS in the last 72  hours.  Invalid input(s): UACOL, UAPR, USPG, UPH, UTP, UGL, UKET, UBIL, UNIT, UROB, ULEU, UEPI, UWBC, URBC, UBAC, CAST, UCOM, BILUA  Basic Metabolic Panel: Recent Labs  Lab 08/07/17 1248 08/08/17 0542  NA 142 142  K 4.4 3.4*  CL  --  110  CO2 26 26  GLUCOSE 206* 152*  BUN 21.5 24*  CREATININE 1.4* 0.82  CALCIUM 9.6 8.1*   GFR Estimated Creatinine Clearance: 56.9 mL/min (by C-G formula based on SCr of 0.82 mg/dL). Liver Function Tests: Recent Labs  Lab 08/07/17 1248 08/08/17 0542  AST 53* 149*  ALT 48 91*  ALKPHOS 121 96  BILITOT 1.25* 1.5*  PROT 8.1 6.3*  ALBUMIN 3.9 3.2*   No results for input(s): LIPASE, AMYLASE in the last 168 hours. No results for input(s): AMMONIA in the last 168 hours. Coagulation profile No results for input(s): INR, PROTIME in the last 168 hours.  CBC: Recent Labs  Lab 08/07/17 1248 08/08/17 0542  WBC 18.2* 19.6*  NEUTROABS 15.7*  --   HGB 14.9 12.5  HCT 46.3 39.1  MCV 93.6 95.1  PLT 387 332   Cardiac Enzymes: No results  for input(s): CKTOTAL, CKMB, CKMBINDEX, TROPONINI in the last 168 hours. BNP: Invalid input(s): POCBNP CBG: No results for input(s): GLUCAP in the last 168 hours. D-Dimer No results for input(s): DDIMER in the last 72 hours. Hgb A1c No results for input(s): HGBA1C in the last 72 hours. Lipid Profile No results for input(s): CHOL, HDL, LDLCALC, TRIG, CHOLHDL, LDLDIRECT in the last 72 hours. Thyroid function studies No results for input(s): TSH, T4TOTAL, T3FREE, THYROIDAB in the last 72 hours.  Invalid input(s): FREET3 Anemia work up No results for input(s): VITAMINB12, FOLATE, FERRITIN, TIBC, IRON, RETICCTPCT in the last 72 hours. Microbiology No results found for this or any previous visit (from the past 240 hour(s)).    Studies:  Dg Abd 1 View  Result Date: 08/07/2017 CLINICAL DATA:  Constipation and abdominal pain with distention. History of left-sided colon cancer. EXAM: ABDOMEN - 1 VIEW  COMPARISON:  CT 05/19/2017 FINDINGS: Gallbladder wall calcifications. No free intraperitoneal air on this supine exam. No gaseous distention of bowel loops. Right hemidiaphragm elevation. Distal gas and stool. IMPRESSION: No acute findings. Electronically Signed   By: Abigail Miyamoto M.D.   On: 08/07/2017 12:34   Ct Abdomen Pelvis W Contrast  Result Date: 08/07/2017 CLINICAL DATA:  Leukocytosis, abdominal pain. Metastatic colon cancer. EXAM: CT ABDOMEN AND PELVIS WITH CONTRAST TECHNIQUE: Multidetector CT imaging of the abdomen and pelvis was performed using the standard protocol following bolus administration of intravenous contrast. CONTRAST:  188m ISOVUE-300 IOPAMIDOL (ISOVUE-300) INJECTION 61% COMPARISON:  CT abdomen/pelvis dated 05/19/2017. Partial comparison to CT chest dated 06/06/2017. FINDINGS: Lower chest: Bilateral pulmonary metastases, stable versus mildly improved, including: --8 mm nodule in the medial right lower lobe (series 4/ image 4), previously 10 mm --8 mm nodule inferiorly in the right lower lobe (series 4/ image 27), unchanged --15 x 16 mm left lower lobe nodule (series 4/ image 28), previously 16 x 17 mm Hepatobiliary: 1.6 x 2.9 cm metastasis in the anterior dome of the liver in segment 4A (series 2/ image 9), previously 3.6 x 4.7 cm. 1.2 x 1.7 cm metastasis in segment 3 (series 2/ image 23), previously 2.5 x 3.4 cm. Cholelithiasis, without associated inflammatory changes. Pancreas: Within normal limits. Spleen: Within normal limits. Adrenals/Urinary Tract: Adrenal glands are within normal limits. 2.4 x 1.8 cm left perirenal mass medial to the left renal vein (series 2/ image 32), previously 2.6 x 2.3 cm, suspicious for perianal metastasis. Right kidney is within normal limits. No hydronephrosis. Bladder is low-lying but otherwise within normal limits. Stomach/Bowel: Stomach is notable for a small hiatal hernia. No evidence of small bowel obstruction. Normal appendix (series 2/image 66).  Left colonic diverticulosis, without evidence of diverticulitis. Dilated colon leading to a low colonic stricture in the region of the prior sigmoid mass, which is no longer visualized (series 2/ image 73). This appearance is compatible with colonic obstruction secondary to the stricture. There is mild wall thickening involving the rectosigmoid colon proximal to the stricture (series 2/images 65 and 77). Vascular/Lymphatic: No evidence of abdominal aortic aneurysm. Atherosclerotic calcifications of the abdominal aorta and branch vessels. 7 mm short axis left para-aortic node (series 2/ image 34), previously 8 mm. Otherwise, no suspicious abdominopelvic lymphadenopathy. Reproductive: Status post hysterectomy. Right ovary is within normal limits. 3.5 x 1.8 cm mixed cystic/solid left ovarian mass (series 2/ image 73), previously 3.5 x 2.5 cm. Other: No abdominopelvic ascites. Musculoskeletal: Degenerative changes of the visualized thoracolumbar spine. IMPRESSION: Low colonic obstruction secondary to a rectosigmoid stricture at the site  of the patient's prior colonic mass, which is no longer evident on CT. Mild upstream colonic wall thickening raises the possibility of superimposed colitis. Improving hepatic metastases, as above. Left perirenal metastasis, decreased. Visualized pulmonary metastases at the lung bases are grossly unchanged. 3.5 cm mixed cystic/solid left ovarian mass is mildly decreased. Electronically Signed   By: Julian Hy M.D.   On: 08/07/2017 18:06    Assessment: 83 y.o. with metastatic colon cancer to liver and lungs, currently on palliative chemotherapy Xeloda and Panitumumab, presented with bowel obstruction   1.  Colonic obstruction, secondary to primary sigmoid colon tumor vs stricture  2.  Metastatic colon cancer  to liver and lungs, KRAS/NRAS wild type  3. HTN 4. AF   Plan:  -I appreciate the input from GI and surgery. I spoke with Dr. Therisa Doyne today.  Given her advanced age,  her preference not going through surgery, and she may not want more chemotherapy, I think colonic stent placement is probably a better option for the patient than diverting colostomy. She is scheduled the procedure for Monday  -I reviewed her CT scan findings, which showed great partial response to chemotherapy.  Given her excellent tolerance to treatment and good response, I encouraged her to consider continuing treatment after the obstruction event resolves.  She is quite fatigued, her daughter-in-law feels it may be difficult for her to continue treatment.  They will take time to think about it -I will follow-up during her hospital stay.  I appreciate the excellent care from the hospitalist team, spoke with Dr. Loleta Books today     Truitt Merle, MD 08/08/2017  2:26 PM

## 2017-08-08 NOTE — Care Management Note (Signed)
Case Management Note  Patient Details  Name: KESSIE CROSTON MRN: [id number] Date of Birth: 02/07/1933  Subjective/Objective:                  Bowel obstruction  Action/Plan: Date: August 08, 2017 Velva Harman, BSN, [place], [place] Chart and notes review for patient progress and needs. Will follow for case management and discharge needs. Next review date: [id number] Expected Discharge Date:  (unknown)               Expected Discharge Plan:  Home/Self Care  In-House Referral:     Discharge planning Services  CM Consult  Post Acute Care Choice:    Choice offered to:     DME Arranged:    DME Agency:     HH Arranged:    HH Agency:     Status of Service:  In process, will continue to follow  If discussed at Long Length of Stay Meetings, dates discussed:    Additional Comments:  Leeroy Cha, RN 08/08/2017, 8:56 AM

## 2017-08-08 NOTE — Progress Notes (Signed)
[facility]/Trauma Progress Note      Assessment/Plan CAD Afib HTN  Distal colonic obstruction, likely at rectosigmoid junction secondary to adenocarcinoma Metastatic colorectal carcinoma involving liver and lungs - GI will attempt to palliative stent Monday 12/31 - will plan for diverting colostomy if GI unable to stent  FEN: clears, NPO at midnight Sunday VTE: SCD's ID: none currently Follow up: TBD  DISPO: GI stent Monday, we will follow  We will not see again until Monday.  Please call if you need [place] before then.   LOS: 1 day    Subjective:  CC: pain  Pt states not much pain at rest but increased pain with movement. She states she has nausea and vomiting with eating. Small amt of flatus but no BM. No family at bedside.   Objective: Vital signs in last 24 hours: Temp:  [97.6 F (36.4 C)-98.2 F (36.8 C)] 97.6 F (36.4 C) (12/28 0951) Pulse Rate:  [82-105] 102 (12/28 0951) Resp:  [13-24] 14 (12/28 0951) BP: (97-153)/(70-87) 125/70 (12/28 0951) SpO2:  [88 %-100 %] 93 % (12/28 0951) Weight:  [179 lb (81.2 kg)] 179 lb (81.2 kg) (12/27 1449) Last BM Date: 08/02/17  Intake/Output from previous day: 12/27 0701 - 12/28 0700 In: 2000 [IV Piggyback:2000] Out: -  Intake/Output this shift: Total I/O In: 983.3 [I.V.:983.3] Out: -   PE: Gen:  Alert, NAD, pleasant, cooperative Pulm:   Rate and effort normal Abd: firm, + distention, hypoactive BS, no hernia, diffuse TTP without guarding Skin: warm and dry   Anti-infectives: Anti-infectives (From admission, onward)   None      Lab Results:  Recent Labs    08/07/17 1248 08/08/17 0542  WBC 18.2* 19.6*  HGB 14.9 12.5  HCT 46.3 39.1  PLT 387 332   BMET Recent Labs    08/07/17 1248 08/08/17 0542  NA 142 142  K 4.4 3.4*  CL  --  110  CO2 26 26  GLUCOSE 206* 152*  BUN 21.5 24*  CREATININE 1.4* 0.82  CALCIUM 9.6 8.1*   PT/INR No results for input(s): LABPROT, INR in the last 72  hours. CMP     Component Value Date/Time   NA 142 08/08/2017 0542   NA 142 08/07/2017 1248   K 3.4 (L) 08/08/2017 0542   K 4.4 08/07/2017 1248   CL 110 08/08/2017 0542   CO2 26 08/08/2017 0542   CO2 26 08/07/2017 1248   GLUCOSE 152 (H) 08/08/2017 0542   GLUCOSE 206 (H) 08/07/2017 1248   BUN 24 (H) 08/08/2017 0542   BUN 21.5 08/07/2017 1248   CREATININE 0.82 08/08/2017 0542   CREATININE 1.4 (H) 08/07/2017 1248   CALCIUM 8.1 (L) 08/08/2017 0542   CALCIUM 9.6 08/07/2017 1248   PROT 6.3 (L) 08/08/2017 0542   PROT 8.1 08/07/2017 1248   ALBUMIN 3.2 (L) 08/08/2017 0542   ALBUMIN 3.9 08/07/2017 1248   AST 149 (H) 08/08/2017 0542   AST 53 (H) 08/07/2017 1248   ALT 91 (H) 08/08/2017 0542   ALT 48 08/07/2017 1248   ALKPHOS 96 08/08/2017 0542   ALKPHOS 121 08/07/2017 1248   BILITOT 1.5 (H) 08/08/2017 0542   BILITOT 1.25 (H) 08/07/2017 1248   GFRNONAA >60 08/08/2017 0542   GFRAA >60 08/08/2017 0542   Lipase  No results found for: LIPASE  Studies/Results: Dg Abd 1 View  Result Date: 08/07/2017 CLINICAL DATA:  Constipation and abdominal pain with distention. History of left-sided colon cancer. EXAM: ABDOMEN - 1 VIEW  COMPARISON:  CT 05/19/2017 FINDINGS: Gallbladder wall calcifications. No free intraperitoneal air on this supine exam. No gaseous distention of bowel loops. Right hemidiaphragm elevation. Distal gas and stool. IMPRESSION: No acute findings. Electronically Signed   By: Abigail Miyamoto M.D.   On: 08/07/2017 12:34   Ct Abdomen Pelvis W Contrast  Result Date: 08/07/2017 CLINICAL DATA:  Leukocytosis, abdominal pain. Metastatic colon cancer. EXAM: CT ABDOMEN AND PELVIS WITH CONTRAST TECHNIQUE: Multidetector CT imaging of the abdomen and pelvis was performed using the standard protocol following bolus administration of intravenous contrast. CONTRAST:  151mL ISOVUE-300 IOPAMIDOL (ISOVUE-300) INJECTION 61% COMPARISON:  CT abdomen/pelvis dated 05/19/2017. Partial comparison to CT chest  dated 06/06/2017. FINDINGS: Lower chest: Bilateral pulmonary metastases, stable versus mildly improved, including: --8 mm nodule in the medial right lower lobe (series 4/ image 4), previously 10 mm --8 mm nodule inferiorly in the right lower lobe (series 4/ image 27), unchanged --15 x 16 mm left lower lobe nodule (series 4/ image 28), previously 16 x 17 mm Hepatobiliary: 1.6 x 2.9 cm metastasis in the anterior dome of the liver in segment 4A (series 2/ image 9), previously 3.6 x 4.7 cm. 1.2 x 1.7 cm metastasis in segment 3 (series 2/ image 23), previously 2.5 x 3.4 cm. Cholelithiasis, without associated inflammatory changes. Pancreas: Within normal limits. Spleen: Within normal limits. Adrenals/Urinary Tract: Adrenal glands are within normal limits. 2.4 x 1.8 cm left perirenal mass medial to the left renal vein (series 2/ image 32), previously 2.6 x 2.3 cm, suspicious for perianal metastasis. Right kidney is within normal limits. No hydronephrosis. Bladder is low-lying but otherwise within normal limits. Stomach/Bowel: Stomach is notable for a small hiatal hernia. No evidence of small bowel obstruction. Normal appendix (series 2/image 66). Left colonic diverticulosis, without evidence of diverticulitis. Dilated colon leading to a low colonic stricture in the region of the prior sigmoid mass, which is no longer visualized (series 2/ image 73). This appearance is compatible with colonic obstruction secondary to the stricture. There is mild wall thickening involving the rectosigmoid colon proximal to the stricture (series 2/images 65 and 77). Vascular/Lymphatic: No evidence of abdominal aortic aneurysm. Atherosclerotic calcifications of the abdominal aorta and branch vessels. 7 mm short axis left para-aortic node (series 2/ image 34), previously 8 mm. Otherwise, no suspicious abdominopelvic lymphadenopathy. Reproductive: Status post hysterectomy. Right ovary is within normal limits. 3.5 x 1.8 cm mixed cystic/solid left  ovarian mass (series 2/ image 73), previously 3.5 x 2.5 cm. Other: No abdominopelvic ascites. Musculoskeletal: Degenerative changes of the visualized thoracolumbar spine. IMPRESSION: Low colonic obstruction secondary to a rectosigmoid stricture at the site of the patient's prior colonic mass, which is no longer evident on CT. Mild upstream colonic wall thickening raises the possibility of superimposed colitis. Improving hepatic metastases, as above. Left perirenal metastasis, decreased. Visualized pulmonary metastases at the lung bases are grossly unchanged. 3.5 cm mixed cystic/solid left ovarian mass is mildly decreased. Electronically Signed   By: Julian Hy M.D.   On: 08/07/2017 18:06    Kalman Drape , [facility] Surgery 08/08/2017, 11:50 AM Pager: [phone]  Agree with above. Stent scheduled for Monday, 12/31.  Alphonsa Overall, MD, [facility] Surgery Pager: [phone] Office phone:  [phone]

## 2017-08-08 NOTE — Progress Notes (Signed)
PROGRESS NOTE    Mary [name]  CNO:[id number] DOB: 02/07/1933 DOA: 08/07/2017 PCP: Moshe Cipro, MD      Brief Narrative:  83 year old female with atrial fibrillation not on anticoagulation, diagnosed in January of this year with colonic mass, metastatic colon cancer, later started on palliative chemotherapy with Xeloda and an anti-EGFR Ab with Dr. Burr Medico, has excellent functional status at present.  Now in last week, became constipated, then vomiting, bloating.  On arival to ER, appeared on CT to have colonic stricture and obstruction, Gen Surg consulted, placed on PCA.   Assessment & Plan:  Principal Problem:   Colonic obstruction [facility]) Active Problems:   Cancer of left colon (HCC)   Persistent atrial fibrillation (HCC)   Essential hypertension   Goals of care, counseling/discussion   Hyperglycemia   Colon obstruction -GI consulted, appreciate cares, will attempt stent on Monday -Gen surgery consulted, appreciate cares, colostomy is back up if stent not possible -May have clears -MiraLAX per GI as tolerated -Continue PCA for now -Consult to Palliative Care -Appreciate Oncology recommendations    Leukocytosis Stable  Hypertension -Continue diltiazem -Metoprolol IV PRN for severe pressures -Hold amlodipine and metoprolo and ARB and Lasix  Paroxysmal atrial fibrillation CHADS2Vas 4 at least.  Not on AC.       DVT prophylaxis: SCDss Code Status: DNR Family Communication: Daughter in law at bedside Disposition Plan: Stent on Monday.  Conservative mgmt of bowel obstruction until then.   Consultants:   GI  Onc  Gen Surg  Pallaitive Care  Procedures:   None  Antimicrobials:   None    Subjective: Feeling okay.  No more vomiting.  Abdominal pain okay on PCA.  No confusion, fever, chest pain.  Objective: Vitals:   08/08/17 1412 08/08/17 1600 08/08/17 1752 08/08/17 2018  BP: 127/68  119/61 (!) 116/57  Pulse: 87  68 77  Resp: _0 Temp: (!) 97.5 F (36.4 C)  97.6 F (36.4 C) 97.7 F (36.5 C)  TempSrc: Axillary  Oral Oral  SpO2: 94% 92% 93% 93%  Weight:      Height:        Intake/Output Summary (Last 24 hours) at 08/08/2017 2057 Last data filed at 08/08/2017 1800 Gross per 24 hour  Intake 1983.33 ml  Output -  Net 1983.33 ml   Filed Weights   08/07/17 1449  Weight: 81.2 kg (179 lb)    Examination: General appearance: Elderly adult female, alert and in no acute distress.  Appears tired. HEENT: Anicteric, conjunctiva pink, lids and lashes normal. No nasal deformity, discharge, epistaxis.  Lips dry.   Skin: Warm and dry.  No jaundice. Pallor noted. No suspicious rashes or lesions. Cardiac: RRR, nl S1-S2, no murmurs appreciated.  Capillary refill is brisk.  No LE edema. Respiratory: Normal respiratory rate and rhythm.  CTAB without rales or wheezes. Abdomen: Abdomen soft.  Moderate diffuse TTP, bowel sounds present. No ascites, distension, hepatosplenomegaly.   MSK: No deformities or effusions. Neuro: Awake and alert.  EOMI, moves all extremities. Speech fluent.    Psych: Sensorium intact and responding to questions, attention normal. Affect  blunted.  Judgment and insight appear normal.    Data Reviewed: I have personally reviewed following labs and imaging studies:  CBC: Recent Labs  Lab 08/07/17 1248 08/08/17 0542  WBC 18.2* 19.6*  NEUTROABS 15.7*  --   HGB 14.9 12.5  HCT 46.3 39.1  MCV 93.6 95.1  PLT 387 294   Basic Metabolic  Panel: Recent Labs  Lab 08/07/17 1248 08/08/17 0542  NA 142 142  K 4.4 3.4*  CL  --  110  CO2 26 26  GLUCOSE 206* 152*  BUN 21.5 24*  CREATININE 1.4* 0.82  CALCIUM 9.6 8.1*   GFR: Estimated Creatinine Clearance: 56.9 mL/min (by C-G formula based on SCr of 0.82 mg/dL). Liver Function Tests: Recent Labs  Lab 08/07/17 1248 08/08/17 0542  AST 53* 149*  ALT 48 91*  ALKPHOS 121 96  BILITOT 1.25* 1.5*  PROT 8.1 6.3*  ALBUMIN 3.9 3.2*   No results for  input(s): LIPASE, AMYLASE in the last 168 hours. No results for input(s): AMMONIA in the last 168 hours. Coagulation Profile: No results for input(s): INR, PROTIME in the last 168 hours. Cardiac Enzymes: No results for input(s): CKTOTAL, CKMB, CKMBINDEX, TROPONINI in the last 168 hours. BNP (last 3 results) No results for input(s): PROBNP in the last 8760 hours. HbA1C: No results for input(s): HGBA1C in the last 72 hours. CBG: No results for input(s): GLUCAP in the last 168 hours. Lipid Profile: No results for input(s): CHOL, HDL, LDLCALC, TRIG, CHOLHDL, LDLDIRECT in the last 72 hours. Thyroid Function Tests: No results for input(s): TSH, T4TOTAL, FREET4, T3FREE, THYROIDAB in the last 72 hours. Anemia Panel: No results for input(s): VITAMINB12, FOLATE, FERRITIN, TIBC, IRON, RETICCTPCT in the last 72 hours. Urine analysis: No results found for: COLORURINE, APPEARANCEUR, LABSPEC, PHURINE, GLUCOSEU, HGBUR, BILIRUBINUR, KETONESUR, PROTEINUR, UROBILINOGEN, NITRITE, LEUKOCYTESUR Sepsis Labs: _0 (procalcitonin:4,lacticacidven:4)  )No results found for this or any previous visit (from the past 240 hour(s)).       Radiology Studies: Dg Abd 1 View  Result Date: 08/07/2017 CLINICAL DATA:  Constipation and abdominal pain with distention. History of left-sided colon cancer. EXAM: ABDOMEN - 1 VIEW COMPARISON:  CT 05/19/2017 FINDINGS: Gallbladder wall calcifications. No free intraperitoneal air on this supine exam. No gaseous distention of bowel loops. Right hemidiaphragm elevation. Distal gas and stool. IMPRESSION: No acute findings. Electronically Signed   By: Abigail Miyamoto M.D.   On: 08/07/2017 12:34   Ct Abdomen Pelvis W Contrast  Result Date: 08/07/2017 CLINICAL DATA:  Leukocytosis, abdominal pain. Metastatic colon cancer. EXAM: CT ABDOMEN AND PELVIS WITH CONTRAST TECHNIQUE: Multidetector CT imaging of the abdomen and pelvis was performed using the standard protocol following bolus  administration of intravenous contrast. CONTRAST:  180m ISOVUE-300 IOPAMIDOL (ISOVUE-300) INJECTION 61% COMPARISON:  CT abdomen/pelvis dated 05/19/2017. Partial comparison to CT chest dated 06/06/2017. FINDINGS: Lower chest: Bilateral pulmonary metastases, stable versus mildly improved, including: --8 mm nodule in the medial right lower lobe (series 4/ image 4), previously 10 mm --8 mm nodule inferiorly in the right lower lobe (series 4/ image 27), unchanged --15 x 16 mm left lower lobe nodule (series 4/ image 28), previously 16 x 17 mm Hepatobiliary: 1.6 x 2.9 cm metastasis in the anterior dome of the liver in segment 4A (series 2/ image 9), previously 3.6 x 4.7 cm. 1.2 x 1.7 cm metastasis in segment 3 (series 2/ image 23), previously 2.5 x 3.4 cm. Cholelithiasis, without associated inflammatory changes. Pancreas: Within normal limits. Spleen: Within normal limits. Adrenals/Urinary Tract: Adrenal glands are within normal limits. 2.4 x 1.8 cm left perirenal mass medial to the left renal vein (series 2/ image 32), previously 2.6 x 2.3 cm, suspicious for perianal metastasis. Right kidney is within normal limits. No hydronephrosis. Bladder is low-lying but otherwise within normal limits. Stomach/Bowel: Stomach is notable for a small hiatal hernia. No evidence of small bowel obstruction.  Normal appendix (series 2/image 66). Left colonic diverticulosis, without evidence of diverticulitis. Dilated colon leading to a low colonic stricture in the region of the prior sigmoid mass, which is no longer visualized (series 2/ image 73). This appearance is compatible with colonic obstruction secondary to the stricture. There is mild wall thickening involving the rectosigmoid colon proximal to the stricture (series 2/images 65 and 77). Vascular/Lymphatic: No evidence of abdominal aortic aneurysm. Atherosclerotic calcifications of the abdominal aorta and branch vessels. 7 mm short axis left para-aortic node (series 2/ image 34),  previously 8 mm. Otherwise, no suspicious abdominopelvic lymphadenopathy. Reproductive: Status post hysterectomy. Right ovary is within normal limits. 3.5 x 1.8 cm mixed cystic/solid left ovarian mass (series 2/ image 73), previously 3.5 x 2.5 cm. Other: No abdominopelvic ascites. Musculoskeletal: Degenerative changes of the visualized thoracolumbar spine. IMPRESSION: Low colonic obstruction secondary to a rectosigmoid stricture at the site of the patient's prior colonic mass, which is no longer evident on CT. Mild upstream colonic wall thickening raises the possibility of superimposed colitis. Improving hepatic metastases, as above. Left perirenal metastasis, decreased. Visualized pulmonary metastases at the lung bases are grossly unchanged. 3.5 cm mixed cystic/solid left ovarian mass is mildly decreased. Electronically Signed   By: Julian Hy M.D.   On: 08/07/2017 18:06        Scheduled Meds: . diltiazem  240 mg Oral Daily  . HYDROmorphone   Intravenous Q4H  . latanoprost  1 drop Both Eyes QHS  . LORazepam  0.5 mg Intravenous Once   Continuous Infusions: . lactated ringers 100 mL/hr at 08/08/17 0150     LOS: 1 day    Time spent: 45 inutes    Edwin Dada, MD [facility] 08/08/2017, 12:30 PM     Pager [phone] --- please page though AMION:  [email or web address] Password [email or web address] If 7PM-7AM, please contact night-coverage

## 2017-08-08 NOTE — Telephone Encounter (Signed)
Dr. Lindi Adie is on call.

## 2017-08-08 NOTE — Consult Note (Signed)
Consultation Note Date: 08/08/2017   Patient Name: Mary [name]  DOB: 02/07/1933  MRN: [id number]  Age / Sex: 83 y.o., female  PCP: Moshe Cipro, MD Referring Physician: Edwin Dada, *  Reason for Consultation: Establishing goals of care and Pain control  HPI/Patient Profile: 83 y.o. female  admitted on 08/07/2017    Clinical Assessment and Goals of Care:  83 year old lady who lives by herself in [place], [place]. Son and daughter-in-law live 55 miles away. Patient was diagnosed with a mass in her colon-adenocarcinoma in February 2018. Patient has been on palliative chemotherapy. Her last treatment was 2 weeks ago. The patient had a bowel movement 1 week ago. She started having nausea and vomiting. Patient's daughter-in-law brought her into the hospital the day after Christmas. The patient has been placed on Dilaudid PCA for pain, has been seen by GI, surgery, medical oncology. The patient reportedly also required ureteral stent placement earlier this year. Patient is known to have hepatic metastases questionable left kidney metastases, pulmonary metastases. Reportedly declined surgery in the past. Colonic stenting to be attempted as a palliative measure on 12-31.  A palliative consultation has been requested for additional discussions, symptom management.  Elderly lady resting in bed. Daughter-in-law present at the bedside. I introduce myself and palliative care as follows: Palliative medicine is specialized medical care for people living with serious illness. It focuses on providing relief from the symptoms and stress of a serious illness. The goal is to improve quality of life for both the patient and the family.  Overall, patient states that the Dilaudid PCA helps. She is concerned that at times the PCA alarms sets off causing her to wake up from her sleep and this exacerbates her pain. Will  review PCA settings and adjust accordingly.  Patient has not had a bowel movement since 1 week. She has colonic obstruction. Will continue to monitor. For now, continue with Dilaudid PCA. Adjust settings according to patient's pain level.  See additional discussions/recommendations below. Thank you for the consult.  NEXT OF KIN  Son and daughter-in-law who live 43 miles from her. Patient lives by herself in [place], [place].  Discussions/SUMMARY OF RECOMMENDATIONS    Met with patient, her daughter-in-law present at the bedside. Reviewed current pain management regimen-Dilaudid bolus only PCA. Patient is comfortable on current settings. Will continue to monitor.  GI notes reviewed. Discussed with patient and daughter-in-law. Possible colonic stenting to be attempted on Monday 12-31.  Appreciate medical oncology, hospital medicine, general surgery input and recommendations. Palliative medicine to follow along for adequate symptom management, ongoing goals of care discussions based on hospital course and overall disease trajectory. As per patient's wishes, he did introduced concept of hospice and hospice philosophy of care in general.  Code Status/Advance Care Planning:  DNR    Symptom Management:      Palliative Prophylaxis:   Delirium Protocol  Psycho-social/Spiritual:   Desire for further Chaplaincy support:yes  Additional Recommendations: Caregiving  Support/Resources  Prognosis:   Unable to determine  Discharge Planning: To  Be Determined      Primary Diagnoses: Present on Admission: . Colonic obstruction ([place]) . Cancer of left colon ([place]) . Essential hypertension . Persistent atrial fibrillation ([place]) . Hyperglycemia   I have reviewed the medical record, interviewed the patient and family, and examined the patient. The following aspects are pertinent.  Past Medical History:  Diagnosis Date  . Aneurysm of ascending aorta (HCC) 12/03/2016   3.7 cm by echo  10/2016  . Colon cancer [facility])    dx via biospy from colonoscopy-- malignant ---  currently having work-up done w/ dr gross (general surgeon) and coordinate surgery w/ urologsit for renal tumor  . Essential hypertension 10/31/2016  . GERD (gastroesophageal reflux disease)   . Glaucoma   . Hiatal hernia   . HOH (hard of hearing)    bilateral even w/ hearing aids  . Hyperlipidemia   . Kidney tumor    left side  . Legally blind in right eye, as defined in [place]    due to macular degeneration  . Macular degeneration   . Nausea    intermittant due to colon mass  . Ovarian cyst   . Persistent atrial fibrillation ([place]) 10/31/2016   on ASA only  . PONV (postoperative nausea and vomiting)   . Wears glasses   . Wears hearing aid    bilateral   Social History   Socioeconomic History  . Marital status: Widowed    Spouse name: None  . Number of children: None  . Years of education: None  . Highest education level: None  Social Needs  . Financial resource strain: None  . Food insecurity - worry: None  . Food insecurity - inability: None  . Transportation needs - medical: None  . Transportation needs - non-medical: None  Occupational History  . None  Tobacco Use  . Smoking status: Never Smoker  . Smokeless tobacco: Never Used  Substance and Sexual Activity  . Alcohol use: No  . Drug use: No  . Sexual activity: None  Other Topics Concern  . None  Social History Narrative  . None   Family History  Problem Relation Age of Onset  . Cancer Sister 64       breast cancer   . Cancer Brother 47       lung cancer   . Diabetes Son   . High blood pressure Son   . Peripheral Artery Disease Mother   . High blood pressure Mother    Scheduled Meds: . diltiazem  240 mg Oral Daily  . HYDROmorphone   Intravenous Q4H  . latanoprost  1 drop Both Eyes QHS   Continuous Infusions: . lactated ringers 100 mL/hr at 08/08/17 0150   PRN Meds:.acetaminophen **OR** acetaminophen, diphenhydrAMINE  **OR** diphenhydrAMINE, metoprolol tartrate, naloxone **AND** sodium chloride flush, ondansetron (ZOFRAN) IV, polyvinyl alcohol Medications Prior to Admission:  Prior to Admission medications   Medication Sig Start Date End Date Taking? Authorizing Provider  amLODipine (NORVASC) 5 MG tablet Take 1 tablet (5 mg total) by mouth daily. 03/04/17  Yes Skeet Latch, MD  clindamycin (CLINDAGEL) 1 % gel Apply topically 2 (two) times daily. 07/24/17  Yes Truitt Merle, MD  diltiazem (CARDIZEM CD) 240 MG 24 hr capsule Take 1 capsule (240 mg total) by mouth daily. 05/28/17  Yes Skeet Latch, MD  diphenhydrAMINE (BENADRYL) 25 MG tablet Take 25 mg by mouth daily.   Yes [provider]  escitalopram (LEXAPRO) 10 MG tablet Take 10 mg by mouth daily.  Yes [provider]  fexofenadine (ALLEGRA) 180 MG tablet Take 180 mg by mouth daily.   Yes [provider]  latanoprost (XALATAN) 0.005 % ophthalmic solution Place 1 drop into both eyes at bedtime.   Yes [provider]  losartan (COZAAR) 100 MG tablet Take 1 tablet (100 mg total) by mouth daily. 06/10/17  Yes Skeet Latch, MD  Multiple Vitamins-Minerals (VITEYES AREDS ADVANCED) CAPS Take 1 capsule by mouth daily.   Yes [provider]  omeprazole (PRILOSEC) 40 MG capsule Take 40 mg by mouth daily.   Yes [provider]  ondansetron (ZOFRAN ODT) 4 MG disintegrating tablet Take 1 tablet (4 mg total) by mouth every 8 (eight) hours as needed for nausea or vomiting. 07/09/17  Yes Truitt Merle, MD  traMADol (ULTRAM) 50 MG tablet Take 1 tablet (50 mg total) by mouth 3 (three) times daily as needed for moderate pain or severe pain. For increased pain. 07/24/17  Yes Truitt Merle, MD  capecitabine (XELODA) 500 MG tablet Take 4 tablets (2,000 mg total) by mouth 2 (two) times daily after a meal. Take for 7 days then off 7 days 07/09/17   Truitt Merle, MD  furosemide (LASIX) 40 MG tablet Take 40 mg by mouth daily as needed  for edema.    [provider]  metoprolol tartrate (LOPRESSOR) 25 MG tablet 1/2 TABLET BY MOUTH AS NEEDED FOR PALPITATIONS 07/08/17   Skeet Latch, MD  Polyethyl Glycol-Propyl Glycol (SYSTANE) 0.4-0.3 % SOLN Apply 1 drop to eye daily as needed (dry eyes).     [provider]  potassium chloride SA (K-DUR,KLOR-CON) 20 MEQ tablet Take 1 tablet (20 mEq total) by mouth once for 1 dose. 07/24/17 07/24/17  Truitt Merle, MD   Allergies  Allergen Reactions  . Benazepril     COUGH   . Bactrim [Sulfamethoxazole-Trimethoprim] Nausea And Vomiting   Review of Systems +abd pain +nausea  Physical Exam Elderly lady resting in bed S1-S2 Lungs clear Abdomen soft has mild generalized tenderness No peripheral edema Awake alert, hard of hearing, is able to have full conversation/participate in discussions and decision making. Vital Signs: BP 127/68 (BP Location: Left Arm)   Pulse 87   Temp (!) 97.5 F (36.4 C) (Axillary)   Resp 14   Ht 5' 6"  (1.676 m)   Wt 81.2 kg (179 lb)   SpO2 94%   BMI 28.89 kg/m  Pain Assessment: 0-10 POSS *See Group Information*: S-Acceptable,Sleep, easy to arouse Pain Score: 6    SpO2: SpO2: 94 % O2 Device:SpO2: 94 % O2 Flow Rate: .O2 Flow Rate (L/min): 2 L/min  IO: Intake/output summary:   Intake/Output Summary (Last 24 hours) at 08/08/2017 1531 Last data filed at 08/08/2017 1500 Gross per 24 hour  Intake 3683.33 ml  Output -  Net 3683.33 ml   PPS 40% LBM: Last BM Date: 08/02/17 Baseline Weight: Weight: 81.2 kg (179 lb) Most recent weight: Weight: 81.2 kg (179 lb)     Palliative Assessment/Data:   Flowsheet Rows     Most Recent Value  Intake Tab  Referral Department  Hospitalist  Unit at Time of Referral  Oncology Unit  Palliative Care Primary Diagnosis  Cancer  Palliative Care Type  New Palliative care  Reason for referral  Pain, Clarify Goals of Care  Date first seen by Palliative Care  08/08/17  Clinical Assessment    Palliative Performance Scale Score  40%  Pain Max last 24 hours  5  Pain Min Last 24 hours  3  Nausea Min Last 24 Hours  4  Anxiety Max Last 24 Hours  3  Psychosocial & Spiritual Assessment  Palliative Care Outcomes  Patient/Family meeting held?  Yes  Who was at the meeting?  patient and daughter in law.       Time In:  1400 Time Out:   1510 Time Total:  70 min  Greater than 50%  of this time was spent counseling and coordinating care related to the above assessment and plan.  Signed by: Loistine Chance, MD  [phone]  Please contact Palliative Medicine Team phone at [phone] for questions and concerns.  For individual provider: See Shea Evans

## 2017-08-08 NOTE — Telephone Encounter (Signed)
"  This is Dr. Ines Bloomer calling to speak with Dr.Feng or whoever I can speak with about this patient.  My cell number is [phone]."

## 2017-08-09 LAB — BASIC METABOLIC PANEL
ANION GAP: 5 (ref 5–15)
BUN: 30 mg/dL — ABNORMAL HIGH (ref 6–20)
CHLORIDE: 108 mmol/L (ref 101–111)
CO2: 28 mmol/L (ref 22–32)
CREATININE: 0.74 mg/dL (ref 0.44–1.00)
Calcium: 8.7 mg/dL — ABNORMAL LOW (ref 8.9–10.3)
GFR calc non Af Amer: >60 mL/min (ref >60)
Glucose, Bld: 128 mg/dL — ABNORMAL HIGH (ref 65–99)
Potassium: 3.6 mmol/L (ref 3.5–5.1)
Sodium: 141 mmol/L (ref 135–145)

## 2017-08-09 MED ORDER — POLYETHYLENE GLYCOL 3350 17 GM/SCOOP PO POWD
1.0000 | ORAL | Status: AC
  Administered 2017-08-09 – 2017-08-10 (×2): 255 g via ORAL
  Filled 2017-08-09 (×3): qty 255

## 2017-08-09 NOTE — Progress Notes (Signed)
O2 sats 84% on 3lL at 2300(no distress noted).. On call/resp therapy notified. Venturi mask at 12l/50% initiated at 2300.  O2 improved to 94%

## 2017-08-09 NOTE — Progress Notes (Signed)
PMT progress note  Patient is resting comfortably, there is no family at bedside BP (!) 119/55 (BP Location: Left Arm)   Pulse 90   Temp 98.3 F (36.8 C) (Oral)   Resp 14   Ht 5\' 6"  (1.676 m)   Wt 81.2 kg (179 lb)   SpO2 92%   BMI 28.89 kg/m  Labs and imaging noted  Patient is comfortable on current PCA bolus only Dilaudid settings.   Awake alert Hard of hearing No distress Regular S1 S2 Mild gen abd tenderness No edema  Colon obstruction Cancer of colon A fib HTN Goals of care discussions ongoing  Patient awaits colonic stenting to be attempted on 12-31, she and her daughter in law know that it is being considered for palliative purposes.  Will follow disease trajectory and help address symptoms and goals of care.  25 minutes spent [facility] palliative team [phone]

## 2017-08-09 NOTE — Progress Notes (Addendum)
PHARMACIST - PHYSICIAN COMMUNICATION CONCERNING:  Oral Chemotherapy (Capecitabine, brand name Xeloda)  Capecitabine is on hold due to [employer] approved hold criteria listed below for oral chemotherapy. Noted in chart that patient has potential surgery planned for this admission.  Patient was supposed to resume Xeloda 12/28 per her 7 days on then 7 days off schedule.  Capecitabine (Xeloda) hold criteria  ANC < 1.5  Pltc < 100K  CrCl < 30 mL/min (contraindication)  SCr > 1.5x baseline (or >2 if baseline unknown)  Bilirubin > 1.5x ULN  Active infection  Acute coronary syndrome  Diarrhea - Grade 2 or higher  Hand / foot syndrome - Grade 2 or higher  Surgery planned this admission  Plan: Capecitabine on hold. Will notify Dr. Burr Medico who may resume medication per oral chemotherapy policy.  Hershal Coria, PharmD, BCPS Pager: [phone] 08/09/2017 11:33 AM

## 2017-08-09 NOTE — Progress Notes (Signed)
PROGRESS NOTE    Mary [name]  OBS:[id number] DOB: 02/07/1933 DOA: 08/07/2017 PCP: Moshe Cipro, MD      Brief Narrative:  83 year old female with atrial fibrillation not on anticoagulation, diagnosed in January of this year with colonic mass, metastatic colon cancer, later started on palliative chemotherapy with Xeloda and an anti-EGFR Ab with Dr. Burr Medico, has excellent functional status at present.  Now in last week, became constipated, then vomiting, bloating.  On arival to ER, appeared on CT to have colonic stricture and obstruction, Gen Surg consulted, placed on PCA.   Assessment & Plan:  Principal Problem:   Colonic obstruction (Mary [name]) Active Problems:   Cancer of left colon (HCC)   Persistent atrial fibrillation (HCC)   Essential hypertension   Goals of care, counseling/discussion   Hyperglycemia   Colon obstruction -GI consulted, appreciate cares, will attempt stent on Monday -Gen surgery consulted, appreciate cares, colostomy is back up if stent not possible -May have clears -MiraLAX prep ordered per GI as tolerated -Continue PCA for now -Consult to Palliative Care, appreciate input -Appreciate Oncology recommendations    Leukocytosis Stable, low concern for infection.  Hypertension -Continue diltiazem -Metoprolol IV PRN for severe pressures -Hold amlodipine and metoprolo and ARB and Lasix for now.  Paroxysmal atrial fibrillation CHADS2Vas 4 at least.  Not on AC.       DVT prophylaxis: SCDs Code Status: DNR Family Communication: None present Disposition Plan: Stent on Monday.  Conservative mgmt of bowel obstruction until then.   Consultants:   GI  Onc  Gen Surg  Pallaitive Care  Procedures:   None  Antimicrobials:   None    Subjective: Feeling okay.  Slept very soundly with sleping pill.  Took clears without vomiting.  Abdominal pain well controlled.  Had BM today.  No confusion, fever, chest pain, cough,  dysuria.  Objective: Vitals:   08/09/17 0550 08/09/17 0954 08/09/17 1000 08/09/17 1250  BP: (!) 149/77  (!) 119/55   Pulse: 93  90   Resp: 14 18 14 14   Temp: 97.7 F (36.5 C)  98.3 F (36.8 C)   TempSrc: Oral  Oral   SpO2: 99% 93% 94% 92%  Weight:      Height:        Intake/Output Summary (Last 24 hours) at 08/09/2017 1429 Last data filed at 08/09/2017 0900 Gross per 24 hour  Intake 2440 ml  Output -  Net 2440 ml   Filed Weights   08/07/17 1449  Weight: 81.2 kg (179 lb)    Examination: General appearance: Elderly adult female, alert and in no acute distress.    Skin: Warm and dry.  No jaundice. Pallor noted. No suspicious rashes or lesions. Cardiac: RRR, nl S1-S2, no murmurs appreciated.  Capillary refill is brisk.  No LE edema. Respiratory: Normal respiratory rate and rhythm.  CTAB without rales or wheezes. Abdomen: Abdomen soft.  Mild diffuse TTP, bowel sounds present. No ascites, distension, hepatosplenomegaly.   MSK: No deformities or effusions. Neuro: Awake and alert.  EOMI, moves all extremities. Speech fluent.    Psych: Sensorium intact and responding to questions, attention normal. Affect  blunted.  Judgment and insight appear normal.    Data Reviewed: I have personally reviewed following labs and imaging studies:  CBC: Recent Labs  Lab 08/07/17 1248 08/08/17 0542  WBC 18.2* 19.6*  NEUTROABS 15.7*  --   HGB 14.9 12.5  HCT 46.3 39.1  MCV 93.6 95.1  PLT 387 476   Basic Metabolic Panel: Recent  Labs  Lab 08/07/17 1248 08/08/17 0542 08/09/17 0554  NA 142 142 141  K 4.4 3.4* 3.6  CL  --  110 108  CO2 26 26 28   GLUCOSE 206* 152* 128*  BUN 21.5 24* 30*  CREATININE 1.4* 0.82 0.74  CALCIUM 9.6 8.1* 8.7*   GFR: Estimated Creatinine Clearance: 58.3 mL/min (by C-G formula based on SCr of 0.74 mg/dL). Liver Function Tests: Recent Labs  Lab 08/07/17 1248 08/08/17 0542  AST 53* 149*  ALT 48 91*  ALKPHOS 121 96  BILITOT 1.25* 1.5*  PROT 8.1 6.3*   ALBUMIN 3.9 3.2*   No results for input(s): LIPASE, AMYLASE in the last 168 hours. No results for input(s): AMMONIA in the last 168 hours. Coagulation Profile: No results for input(s): INR, PROTIME in the last 168 hours. Cardiac Enzymes: No results for input(s): CKTOTAL, CKMB, CKMBINDEX, TROPONINI in the last 168 hours. BNP (last 3 results) No results for input(s): PROBNP in the last 8760 hours. HbA1C: No results for input(s): HGBA1C in the last 72 hours. CBG: No results for input(s): GLUCAP in the last 168 hours. Lipid Profile: No results for input(s): CHOL, HDL, LDLCALC, TRIG, CHOLHDL, LDLDIRECT in the last 72 hours. Thyroid Function Tests: No results for input(s): TSH, T4TOTAL, FREET4, T3FREE, THYROIDAB in the last 72 hours. Anemia Panel: No results for input(s): VITAMINB12, FOLATE, FERRITIN, TIBC, IRON, RETICCTPCT in the last 72 hours. Urine analysis: No results found for: COLORURINE, APPEARANCEUR, LABSPEC, PHURINE, GLUCOSEU, HGBUR, BILIRUBINUR, KETONESUR, PROTEINUR, UROBILINOGEN, NITRITE, LEUKOCYTESUR Sepsis Labs: @LABRCNTIP (procalcitonin:4,lacticacidven:4)  )No results found for this or any previous visit (from the past 240 hour(s)).       Radiology Studies: Ct Abdomen Pelvis W Contrast  Result Date: 08/07/2017 CLINICAL DATA:  Leukocytosis, abdominal pain. Metastatic colon cancer. EXAM: CT ABDOMEN AND PELVIS WITH CONTRAST TECHNIQUE: Multidetector CT imaging of the abdomen and pelvis was performed using the standard protocol following bolus administration of intravenous contrast. CONTRAST:  134m ISOVUE-300 IOPAMIDOL (ISOVUE-300) INJECTION 61% COMPARISON:  CT abdomen/pelvis dated 05/19/2017. Partial comparison to CT chest dated 06/06/2017. FINDINGS: Lower chest: Bilateral pulmonary metastases, stable versus mildly improved, including: --8 mm nodule in the medial right lower lobe (series 4/ image 4), previously 10 mm --8 mm nodule inferiorly in the right lower lobe (series 4/  image 27), unchanged --15 x 16 mm left lower lobe nodule (series 4/ image 28), previously 16 x 17 mm Hepatobiliary: 1.6 x 2.9 cm metastasis in the anterior dome of the liver in segment 4A (series 2/ image 9), previously 3.6 x 4.7 cm. 1.2 x 1.7 cm metastasis in segment 3 (series 2/ image 23), previously 2.5 x 3.4 cm. Cholelithiasis, without associated inflammatory changes. Pancreas: Within normal limits. Spleen: Within normal limits. Adrenals/Urinary Tract: Adrenal glands are within normal limits. 2.4 x 1.8 cm left perirenal mass medial to the left renal vein (series 2/ image 32), previously 2.6 x 2.3 cm, suspicious for perianal metastasis. Right kidney is within normal limits. No hydronephrosis. Bladder is low-lying but otherwise within normal limits. Stomach/Bowel: Stomach is notable for a small hiatal hernia. No evidence of small bowel obstruction. Normal appendix (series 2/image 66). Left colonic diverticulosis, without evidence of diverticulitis. Dilated colon leading to a low colonic stricture in the region of the prior sigmoid mass, which is no longer visualized (series 2/ image 73). This appearance is compatible with colonic obstruction secondary to the stricture. There is mild wall thickening involving the rectosigmoid colon proximal to the stricture (series 2/images 65 and 77). Vascular/Lymphatic: No  evidence of abdominal aortic aneurysm. Atherosclerotic calcifications of the abdominal aorta and branch vessels. 7 mm short axis left para-aortic node (series 2/ image 34), previously 8 mm. Otherwise, no suspicious abdominopelvic lymphadenopathy. Reproductive: Status post hysterectomy. Right ovary is within normal limits. 3.5 x 1.8 cm mixed cystic/solid left ovarian mass (series 2/ image 73), previously 3.5 x 2.5 cm. Other: No abdominopelvic ascites. Musculoskeletal: Degenerative changes of the visualized thoracolumbar spine. IMPRESSION: Low colonic obstruction secondary to a rectosigmoid stricture at the site  of the patient's prior colonic mass, which is no longer evident on CT. Mild upstream colonic wall thickening raises the possibility of superimposed colitis. Improving hepatic metastases, as above. Left perirenal metastasis, decreased. Visualized pulmonary metastases at the lung bases are grossly unchanged. 3.5 cm mixed cystic/solid left ovarian mass is mildly decreased. Electronically Signed   By: Julian Hy M.D.   On: 08/07/2017 18:06        Scheduled Meds: . diltiazem  240 mg Oral Daily  . HYDROmorphone   Intravenous Q4H  . latanoprost  1 drop Both Eyes QHS  . polyethylene glycol powder  1 Container Oral Q24H   Continuous Infusions: . lactated ringers 100 mL/hr at 08/08/17 0150     LOS: 2 days    Time spent: 45 inutes    Edwin Dada, MD [facility] 08/09/2017, 12:30 PM     Pager [phone] --- please page though AMION:  [email or web address] If 7PM-7AM, please contact night-coverage

## 2017-08-09 NOTE — Progress Notes (Signed)
[facility] Gastroenterology Progress Note  JASANI LENGEL 83 y.o. 02/07/1933   Subjective: Reports able to rest this morning. Reports ongoing abd pain. Had a large soft BM this morning after given Miralax prep yesterday.  Objective: Vital signs: Vitals:   08/09/17 1000 08/09/17 1250  BP: (!) 119/55   Pulse: 90   Resp: 14 14  Temp: 98.3 F (36.8 C)   SpO2: 94% 92%    Physical Exam: Gen: lethargic, elderly, well-nourished, no acute distress  HEENT: anicteric sclera CV: RRR Chest: CTA B Abd: diffuse tenderness with guarding, mild distention, +BS Ext: no edema  Lab Results: Recent Labs    08/08/17 0542 08/09/17 0554  NA 142 141  K 3.4* 3.6  CL 110 108  CO2 26 28  GLUCOSE 152* 128*  BUN 24* 30*  CREATININE 0.82 0.74  CALCIUM 8.1* 8.7*   Recent Labs    08/07/17 1248 08/08/17 0542  AST 53* 149*  ALT 48 91*  ALKPHOS 121 96  BILITOT 1.25* 1.5*  PROT 8.1 6.3*  ALBUMIN 3.9 3.2*   Recent Labs    08/07/17 1248 08/08/17 0542  WBC 18.2* 19.6*  NEUTROABS 15.7*  --   HGB 14.9 12.5  HCT 46.3 39.1  MCV 93.6 95.1  PLT 387 332      Assessment/Plan: Metastatic colon cancer with rectosigmoid stricture causing partial colonic obstruction - placement of colonic stent planned for 08/11/17 by Dr. Watt Climes. Supportive care. Continue with Miralax prep today and tomorrow. Continue clear liquid diet. NPO p MN on Sunday night for planned stent placement on Monday.   [place] C. 08/09/2017, 1:10 PM  Pager [phone]  AFTER 5 PM or on weekends please call [phone] ID: Calton Dach, female   DOB: 02/07/1933, 83 y.o.   MRN: [id number]

## 2017-08-10 LAB — BASIC METABOLIC PANEL
Anion gap: 6 (ref 5–15)
BUN: 22 mg/dL — AB (ref 6–20)
CALCIUM: 8.5 mg/dL — AB (ref 8.9–10.3)
CO2: 29 mmol/L (ref 22–32)
CREATININE: 0.64 mg/dL (ref 0.44–1.00)
Chloride: 103 mmol/L (ref 101–111)
GFR calc Af Amer: >60 mL/min (ref >60)
GLUCOSE: 108 mg/dL — AB (ref 65–99)
Potassium: 3.5 mmol/L (ref 3.5–5.1)
Sodium: 138 mmol/L (ref 135–145)

## 2017-08-10 MED ORDER — AMLODIPINE BESYLATE 5 MG PO TABS
5.0000 mg | ORAL_TABLET | Freq: Every day | ORAL | Status: DC
  Administered 2017-08-12 – 2017-08-16 (×5): 5 mg via ORAL
  Filled 2017-08-10 (×5): qty 1

## 2017-08-10 MED ORDER — HYDROMORPHONE HCL 1 MG/ML IJ SOLN
0.5000 mg | INTRAMUSCULAR | Status: DC | PRN
  Administered 2017-08-11 (×2): 0.5 mg via INTRAVENOUS
  Filled 2017-08-10 (×2): qty 0.5

## 2017-08-10 NOTE — Progress Notes (Signed)
PROGRESS NOTE    Mary [name]  WKM:[id number] DOB: 02/07/1933 DOA: 08/07/2017 PCP: Moshe Cipro, MD      Brief Narrative:  83 year old female with atrial fibrillation not on anticoagulation, diagnosed in January of this year with colonic mass, metastatic colon cancer, later started on palliative chemotherapy with Xeloda and an anti-EGFR Ab with Dr. Burr Medico, has excellent functional status at present.  Now in last week, became constipated, then vomiting, bloating.  On arival to ER, appeared on CT to have colonic stricture and obstruction, Gen Surg consulted, placed on PCA.   Assessment & Plan:  Principal Problem:   Colonic obstruction [facility]) Active Problems:   Cancer of left colon (HCC)   Persistent atrial fibrillation (HCC)   Essential hypertension   Goals of care, counseling/discussion   Hyperglycemia   Colon obstruction -GI consulted, appreciate cares, will attempt stent on Monday -Gen surgery consulted, appreciate cares, colostomy is back up if stent not possible -May have clears -MiraLAX prep ordered per GI as tolerated -Stop PCA and start hydromorphone PRN -Consult to Palliative Care, appreciate input -Appreciate Oncology recommendations    Leukocytosis Stable, low concern for infection.  Hypertension -Continue diltiazem -Restart amlo -Metoprolol IV PRN for severe pressures -Hold metoprolo and ARB and Lasix for now.  Paroxysmal atrial fibrillation CHADS2Vas 4 at least.  Not on AC.       DVT prophylaxis: SCDs Code Status: DNR Family Communication: Daughter Disposition Plan: Stent on Monday.  Conservative mgmt of bowel obstruction until then.   Consultants:   GI  Onc  Gen Surg  Pallaitive Care  Procedures:   None  Antimicrobials:   None    Subjective: Feeling okay.  Abdominal pain better.  Stools daily.  No bloating.  No fever, chest pain, confusion, weakness. Objective: Vitals:   08/10/17 0859 08/10/17 1000 08/10/17 1243 08/10/17  1500  BP:  125/74  133/81  Pulse:    98  Resp: (!) _0 Temp:  98.2 F (36.8 C)  98.3 F (36.8 C)  TempSrc:  Oral  Oral  SpO2: 97% 97% 95% 95%  Weight:      Height:        Intake/Output Summary (Last 24 hours) at 08/10/2017 1638 Last data filed at 08/10/2017 1100 Gross per 24 hour  Intake 1915 ml  Output 600 ml  Net 1315 ml   Filed Weights   08/07/17 1449  Weight: 81.2 kg (179 lb)    Examination: General appearance: Elderly adult female, alert and in no acute distress.    Skin: Warm and dry.  No jaundice. Pallor noted. No suspicious rashes or lesions. Cardiac: RRR, nl S1-S2, no murmurs appreciated.  Capillary refill is brisk.  No LE edema. Respiratory: Normal respiratory rate and rhythm.  CTAB without rales or wheezes. Abdomen: Abdomen soft.  Mild diffuse TTP, bowel sounds present. No ascites, distension, hepatosplenomegaly.   MSK: No deformities or effusions. Neuro: Awake and alert.  EOMI, moves all extremities. Speech fluent.    Psych: Sensorium intact and responding to questions, attention normal. Affect  blunted.  Judgment and insight appear normal.    Data Reviewed: I have personally reviewed following labs and imaging studies:  CBC: Recent Labs  Lab 08/07/17 1248 08/08/17 0542  WBC 18.2* 19.6*  NEUTROABS 15.7*  --   HGB 14.9 12.5  HCT 46.3 39.1  MCV 93.6 95.1  PLT 387 116   Basic Metabolic Panel: Recent Labs  Lab 08/07/17 1248 08/08/17 0542 08/09/17 0554 08/10/17 0547  NA 142 142 141 138  K 4.4 3.4* 3.6 3.5  CL  --  110 108 103  CO2 _0 GLUCOSE 206* 152* 128* 108*  BUN 21.5 24* 30* 22*  CREATININE 1.4* 0.82 0.74 0.64  CALCIUM 9.6 8.1* 8.7* 8.5*   GFR: Estimated Creatinine Clearance: 58.3 mL/min (by C-G formula based on SCr of 0.64 mg/dL). Liver Function Tests: Recent Labs  Lab 08/07/17 1248 08/08/17 0542  AST 53* 149*  ALT 48 91*  ALKPHOS 121 96  BILITOT 1.25* 1.5*  PROT 8.1 6.3*  ALBUMIN 3.9 3.2*   No results for  input(s): LIPASE, AMYLASE in the last 168 hours. No results for input(s): AMMONIA in the last 168 hours. Coagulation Profile: No results for input(s): INR, PROTIME in the last 168 hours. Cardiac Enzymes: No results for input(s): CKTOTAL, CKMB, CKMBINDEX, TROPONINI in the last 168 hours. BNP (last 3 results) No results for input(s): PROBNP in the last 8760 hours. HbA1C: No results for input(s): HGBA1C in the last 72 hours. CBG: No results for input(s): GLUCAP in the last 168 hours. Lipid Profile: No results for input(s): CHOL, HDL, LDLCALC, TRIG, CHOLHDL, LDLDIRECT in the last 72 hours. Thyroid Function Tests: No results for input(s): TSH, T4TOTAL, FREET4, T3FREE, THYROIDAB in the last 72 hours. Anemia Panel: No results for input(s): VITAMINB12, FOLATE, FERRITIN, TIBC, IRON, RETICCTPCT in the last 72 hours. Urine analysis: No results found for: COLORURINE, APPEARANCEUR, LABSPEC, PHURINE, GLUCOSEU, HGBUR, BILIRUBINUR, KETONESUR, PROTEINUR, UROBILINOGEN, NITRITE, LEUKOCYTESUR Sepsis Labs: _1 (procalcitonin:4,lacticacidven:4)  )No results found for this or any previous visit (from the past 240 hour(s)).       Radiology Studies: No results found.      Scheduled Meds: . diltiazem  240 mg Oral Daily  . latanoprost  1 drop Both Eyes QHS   Continuous Infusions: . lactated ringers 100 mL/hr at 08/10/17 1250     LOS: 3 days    Time spent: 45 inutes    Edwin Dada, MD [facility] 08/10/2017, 12:30 PM     Pager [phone] --- please page though AMION:  [email or web address] Password [email or web address] If 7PM-7AM, please contact night-coverage

## 2017-08-10 NOTE — Progress Notes (Signed)
PMT progress note  Patient is resting comfortably, there is no family at bedside BP 125/74 (BP Location: Right Arm)   Pulse 96   Temp 98.2 F (36.8 C) (Oral)   Resp 18   Ht 5\' 6"  (1.676 m)   Wt 81.2 kg (179 lb)   SpO2 97%   BMI 28.89 kg/m  Labs and imaging noted  Patient is comfortable on current PCA bolus only Dilaudid settings.  Patient had a small BM earlier this morning.   Awake alert Hard of hearing No distress Regular S1 S2 Mild gen abd tenderness No edema  Colon obstruction Cancer of colon A fib HTN Goals of care discussions ongoing  Patient awaits colonic stenting to be attempted on 12-31, she and her daughter in law know that it is being considered for palliative purposes.  Will follow disease trajectory and help address symptoms and goals of care.  25 minutes spent [facility] palliative team [phone]

## 2017-08-10 NOTE — Progress Notes (Signed)
Wasted 2-3cc's of dilaudid from PCA pump with Charge Nurse Wells Guiles RN due to order being dc'd.

## 2017-08-10 NOTE — Telephone Encounter (Signed)
If this was a MD call, why is it being routed via inbasket? This should have been called to my phone.

## 2017-08-11 ENCOUNTER — Other Ambulatory Visit: Payer: Self-pay

## 2017-08-11 ENCOUNTER — Encounter (HOSPITAL_COMMUNITY): Payer: Self-pay | Admitting: Anesthesiology

## 2017-08-11 ENCOUNTER — Encounter (HOSPITAL_COMMUNITY)
Admission: EM | Disposition: A | Discharge status: Home / Self Care | Payer: Self-pay | Source: Home / Self Care | Attending: Internal Medicine

## 2017-08-11 ENCOUNTER — Inpatient Hospital Stay (HOSPITAL_COMMUNITY): Payer: Medicare Other

## 2017-08-11 ENCOUNTER — Inpatient Hospital Stay (HOSPITAL_COMMUNITY): Payer: Medicare Other | Admitting: Anesthesiology

## 2017-08-11 HISTORY — PX: FLEXIBLE SIGMOIDOSCOPY: SHX5431

## 2017-08-11 LAB — BASIC METABOLIC PANEL
Anion gap: 5 (ref 5–15)
BUN: 14 mg/dL (ref 6–20)
CALCIUM: 8.6 mg/dL — AB (ref 8.9–10.3)
CO2: 28 mmol/L (ref 22–32)
CREATININE: 0.53 mg/dL (ref 0.44–1.00)
Chloride: 104 mmol/L (ref 101–111)
GFR calc non Af Amer: >60 mL/min (ref >60)
Glucose, Bld: 118 mg/dL — ABNORMAL HIGH (ref 65–99)
Potassium: 3.1 mmol/L — ABNORMAL LOW (ref 3.5–5.1)
Sodium: 137 mmol/L (ref 135–145)

## 2017-08-11 SURGERY — SIGMOIDOSCOPY, FLEXIBLE
Anesthesia: Monitor Anesthesia Care

## 2017-08-11 MED ORDER — PROPOFOL 10 MG/ML IV BOLUS
INTRAVENOUS | Status: DC | PRN
  Administered 2017-08-11 (×2): 20 mg via INTRAVENOUS
  Administered 2017-08-11: 40 mg via INTRAVENOUS

## 2017-08-11 MED ORDER — POTASSIUM CHLORIDE CRYS ER 20 MEQ PO TBCR
40.0000 meq | EXTENDED_RELEASE_TABLET | Freq: Once | ORAL | Status: AC
  Administered 2017-08-11: 40 meq via ORAL
  Filled 2017-08-11: qty 2

## 2017-08-11 MED ORDER — PROPOFOL 500 MG/50ML IV EMUL
INTRAVENOUS | Status: DC | PRN
  Administered 2017-08-11: 75 ug/kg/min via INTRAVENOUS

## 2017-08-11 MED ORDER — LACTATED RINGERS IV SOLN
INTRAVENOUS | Status: DC
  Administered 2017-08-11: 1000 mL via INTRAVENOUS

## 2017-08-11 MED ORDER — SODIUM CHLORIDE 0.9 % IV SOLN: INTRAVENOUS | Status: DC

## 2017-08-11 MED ORDER — CIPROFLOXACIN IN D5W 400 MG/200ML IV SOLN
INTRAVENOUS | Status: DC | PRN
  Administered 2017-08-11: 400 mg via INTRAVENOUS

## 2017-08-11 MED ORDER — LORAZEPAM 0.5 MG PO TABS
0.5000 mg | ORAL_TABLET | Freq: Once | ORAL | Status: AC
  Administered 2017-08-11: 0.5 mg via ORAL
  Filled 2017-08-11: qty 1

## 2017-08-11 MED ORDER — IOPAMIDOL (ISOVUE-300) INJECTION 61%
INTRAVENOUS | Status: DC | PRN
  Administered 2017-08-11: 25 mL via INTRAVENOUS

## 2017-08-11 MED ORDER — POTASSIUM CHLORIDE 10 MEQ/100ML IV SOLN
10.0000 meq | INTRAVENOUS | Status: AC
  Administered 2017-08-11 (×4): 10 meq via INTRAVENOUS
  Filled 2017-08-11 (×4): qty 100

## 2017-08-11 MED ORDER — POLYETHYLENE GLYCOL 3350 17 G PO PACK: 17.0000 g | PACK | Freq: Two times a day (BID) | ORAL | Status: DC

## 2017-08-11 MED ORDER — PROPOFOL 10 MG/ML IV BOLUS
INTRAVENOUS | Status: AC
  Filled 2017-08-11: qty 40

## 2017-08-11 MED ORDER — CIPROFLOXACIN IN D5W 400 MG/200ML IV SOLN
INTRAVENOUS | Status: AC
  Filled 2017-08-11: qty 200

## 2017-08-11 MED ORDER — HYDROMORPHONE HCL 1 MG/ML IJ SOLN: 0.5000 mg | Freq: Three times a day (TID) | INTRAMUSCULAR | Status: DC | PRN

## 2017-08-11 SURGICAL SUPPLY — 21 items

## 2017-08-11 NOTE — Transfer of Care (Signed)
Immediate Anesthesia Transfer of Care Note  Patient: Mary [name]  Procedure(s) Performed: Procedure(s) with comments: COLONOSCOPY WITH PROPOFOL (N/A) - With Fluoroscopy for colonic stent placement for colonic stricture  Patient Location: PACU  Anesthesia Type:MAC  Level of Consciousness:  sedated, patient cooperative and responds to stimulation  Airway & Oxygen Therapy:Patient Spontanous Breathing and Patient connected to face mask oxgen  Post-op Assessment:  Report given to PACU RN and Post -op Vital signs reviewed and stable  Post vital signs:  Reviewed and stable  Last Vitals:  Vitals:   08/11/17 0515 08/11/17 1200  BP: (!) 152/85 (!) 183/92  Pulse: 99 (!) 112  Resp: 18 16  Temp: 37 C 37 C  SpO2: 72%     Complications: No apparent anesthesia complications

## 2017-08-11 NOTE — Anesthesia Preprocedure Evaluation (Signed)
Anesthesia Evaluation  Patient identified by MRN, date of birth, ID band Patient awake    Reviewed: Allergy & Precautions, NPO status , Patient's Chart, lab work & pertinent test results  History of Anesthesia Complications (+) PONV  Airway Mallampati: II  TM Distance: >3 FB Neck ROM: Full    Dental no notable dental hx.    Pulmonary neg pulmonary ROS,    Pulmonary exam normal breath sounds clear to auscultation       Cardiovascular hypertension, Pt. on medications Normal cardiovascular exam+ dysrhythmias Atrial Fibrillation  Rhythm:Regular Rate:Normal     Neuro/Psych negative neurological ROS  negative psych ROS   GI/Hepatic Neg liver ROS, hiatal hernia,   Endo/Other  negative endocrine ROS  Renal/GU negative Renal ROS  negative genitourinary   Musculoskeletal negative musculoskeletal ROS (+)   Abdominal   Peds negative pediatric ROS (+)  Hematology negative hematology ROS (+)   Anesthesia Other Findings   Reproductive/Obstetrics negative OB ROS                             Anesthesia Physical Anesthesia Plan  ASA: III  Anesthesia Plan: MAC   Post-op Pain Management:    Induction: Intravenous  PONV Risk Score and Plan: 2 and Treatment may vary due to age or medical condition  Airway Management Planned: Simple Face Mask  Additional Equipment:   Intra-op Plan:   Post-operative Plan:   Informed Consent: I have reviewed the patients History and Physical, chart, labs and discussed the procedure including the risks, benefits and alternatives for the proposed anesthesia with the patient or authorized representative who has indicated his/her understanding and acceptance.   Dental advisory given  Plan Discussed with: CRNA  Anesthesia Plan Comments:         Anesthesia Quick Evaluation

## 2017-08-11 NOTE — Anesthesia Postprocedure Evaluation (Signed)
Anesthesia Post Note  Patient: Mary [name]  Procedure(s) Performed: COLONOSCOPY WITH PROPOFOL (N/A )     Patient location during evaluation: PACU Anesthesia Type: MAC Level of consciousness: awake and alert Pain management: pain level controlled Vital Signs Assessment: post-procedure vital signs reviewed and stable Respiratory status: spontaneous breathing, nonlabored ventilation, respiratory function stable and patient connected to nasal cannula oxygen Cardiovascular status: stable and blood pressure returned to baseline Postop Assessment: no apparent nausea or vomiting Anesthetic complications: no    Last Vitals:  Vitals:   08/11/17 1355 08/11/17 1400  BP:  (!) 157/96  Pulse: 99 100  Resp: (!) 22 (!) 22  Temp:    SpO2: 92% 92%    Last Pain:  Vitals:   08/11/17 1337  TempSrc: Oral  PainSc:                  [name]

## 2017-08-11 NOTE — Progress Notes (Addendum)
[facility] PROGRESS NOTE  Mary [name] OXB:[id number] DOB: 02/07/1933 DOA: 08/07/2017  PCP: Moshe Cipro, MD  Brief History/Interval Summary: 83 year old female with atrial fibrillation not on anticoagulation, diagnosed in January of this year with colonic mass, metastatic colon cancer, later started on palliative chemotherapy with Xeloda and an anti-EGFR Ab with Dr. Burr Medico, has excellent functional status at present.  Now in last week, became constipated, then vomiting, bloating.  On arival to ER, was found on CT to have colonic stricture and obstruction, Gen Surg consulted, placed on PCA.  Gastroenterology was subsequently consulted.  Reason for Visit: Chronic stricture due to malignancy  Consultants: General surgery.  Gastroenterology.  Oncology.  Procedures:  Flexible sigmoidoscopy with stent placement 12/31 Findings:      An infiltrative partially obstructing large mass versus necrotic       stricture was found in the recto-sigmoid colon. The mass was       circumferential. No bleeding was present. The ERCP catheter loaded with       the JAG Jagwire was advanced through what we thought was the lumen and       dye was successfully injected with contrast for to prove we were in the       lumen which was confirmed under fluoroscopy guidance. And the wire was       advanced a good ways up the colon and more dye was injected to confirm       wire position and the catheter was removed and under fluoroscopy       guidance This was stented with a 22 mm x 9 cm WallFlex stent in the       customary fashion using both fluoroscopy and endoscopic observation for       release of the stent. It seemed to be in excellent position based on       fluoroscopy and endoscopically and the short waist seemed to be in the       middle of the stent Estimated blood loss was minimal.      A moderate amount of semi-solid stool was found in the rectum,       interfering with visualization. Lavage  of the area was performed using a       moderate amount of sterile water, resulting in incomplete clearance with       fair visualization. Impression:               - Preparation of the colon was fair.                           - Likely malignant partially obstructing tumor in                            the recto-sigmoid colon versus necrotic scarring.                            Injected with contrast proximal to obstruction to                            confirm proper position. Prosthesis placed.                           - Stool in the rectum.                           -  No specimens collected.   Antibiotics: None  Subjective/Interval History: Patient was waiting on her procedure this morning.  Her daughter-in-law was at the bedside.  Patient continues to have abdominal bloating.  Some nausea but no vomiting.  Has been passing gas and has had bowel movements.  ROS: Denies any chest pain or shortness of breath.  Objective:  Vital Signs  Vitals:   08/11/17 1345 08/11/17 1350 08/11/17 1355 08/11/17 1400  BP: (!) 166/74 (!) 166/96  (!) 157/96  Pulse: (!) 103 99 99 100  Resp: (!) 23 (!) 23 (!) 22 (!) 22  Temp:      TempSrc:      SpO2: 94% 91% 92% 92%  Weight:      Height:        Intake/Output Summary (Last 24 hours) at 08/11/2017 1537 Last data filed at 08/11/2017 1338 Gross per 24 hour  Intake 3931.67 ml  Output 852 ml  Net 3079.67 ml   Filed Weights   08/07/17 1449  Weight: 81.2 kg (179 lb)    General appearance: alert, cooperative, appears stated age and no distress Head: Normocephalic, without obvious abnormality, atraumatic Resp: clear to auscultation bilaterally Cardio: regular rate and rhythm, S1, S2 normal, no murmur, click, rub or gallop GI: Abdomen is noted to be distended.  Tender diffusely without any rebound rigidity or guarding.  No masses organomegaly.  Bowel sounds are present.   Extremities: extremities normal, atraumatic, no cyanosis or  edema Neurologic: No focal deficits.  Lab Results:  Data Reviewed: I have personally reviewed following labs and imaging studies  CBC: Recent Labs  Lab 08/07/17 1248 08/08/17 0542  WBC 18.2* 19.6*  NEUTROABS 15.7*  --   HGB 14.9 12.5  HCT 46.3 39.1  MCV 93.6 95.1  PLT 387 397    Basic Metabolic Panel: Recent Labs  Lab 08/07/17 1248 08/08/17 0542 08/09/17 0554 08/10/17 0547 08/11/17 0614  NA 142 142 141 138 137  K 4.4 3.4* 3.6 3.5 3.1*  CL  --  110 108 103 104  CO2 '26 26 28 29 28  '$ GLUCOSE 206* 152* 128* 108* 118*  BUN 21.5 24* 30* 22* 14  CREATININE 1.4* 0.82 0.74 0.64 0.53  CALCIUM 9.6 8.1* 8.7* 8.5* 8.6*    GFR: Estimated Creatinine Clearance: 58.3 mL/min (by C-G formula based on SCr of 0.53 mg/dL).  Liver Function Tests: Recent Labs  Lab 08/07/17 1248 08/08/17 0542  AST 53* 149*  ALT 48 91*  ALKPHOS 121 96  BILITOT 1.25* 1.5*  PROT 8.1 6.3*  ALBUMIN 3.9 3.2*     Radiology Studies: Dg C-arm 1-60 Min  Result Date: 08/11/2017 CLINICAL DATA:  Distal colon stricture. EXAM: DG C-ARM 61-120 MIN COMPARISON:  CT scan dated 08/07/2017 FINDINGS: C-arm imaging was utilized to place a stent in the distal colon. IMPRESSION: Stent placed in the distal colon. Electronically Signed   By: Lorriane Shire M.D.   On: 08/11/2017 13:57     Medications:  Scheduled: . amLODipine  5 mg Oral Daily  . diltiazem  240 mg Oral Daily  . latanoprost  1 drop Both Eyes QHS   Continuous: . lactated ringers 100 mL/hr at 08/11/17 1502   QBH:[id number] **OR** acetaminophen, HYDROmorphone (DILAUDID) injection, metoprolol tartrate, ondansetron (ZOFRAN) IV, polyvinyl alcohol  Assessment/Plan:  Principal Problem:   Colonic obstruction (HCC) Active Problems:   Cancer of left colon (HCC)   Persistent atrial fibrillation (HCC)   Essential hypertension   Goals of care, counseling/discussion   Hyperglycemia  Malignant colonic stricture Patient seen by gastroenterology  and general surgery.  Patient underwent colonic stent placement today.  Appreciate specialty input.  MiraLAX to be continued for now.  Clear liquid diet.  Metastatic colon cancer Management per oncology as outpatient.  History of essential hypertension Continue home medications.  Monitor blood pressures closely.  History of paroxysmal atrial fibrillation Not on anticoagulation.  Monitor heart rate.  Hypokalemia To be repleted.   DVT Prophylaxis: SCDs    Code Status: DNR Family Communication: Discussed with the patient and her daughter-in-law Disposition Plan: Mobilize.  Management as outlined above.    LOS: 4 days   [facility] Pager [phone] 08/11/2017, 3:37 PM  If 7PM-7AM, please contact night-coverage at [email or web address], password [facility]

## 2017-08-11 NOTE — Progress Notes (Signed)
[id number]/Rhonda Davis,BSN,RN3,CCM/[phone]/Chart reviewed for cm needs. 

## 2017-08-11 NOTE — Op Note (Signed)
[facility] Patient Name: Mary [name] Procedure Date: 08/11/2017 MRN: [id number] Attending MD: Clarene Essex , MD Date of Birth: 02/07/1933 CSN: [id number] Age: 83 Admit Type: Inpatient Procedure:                Flexible Sigmoidoscopy Indications:              Abnormal CT of the GI tract, Colonic obstruction,                            For therapy of colonic obstruction Providers:                Clarene Essex, MD, Cleda Daub, RN, William Dalton,                            Technician Referring MD:              Medicines:                Propofol total dose 300 mg IV, 50 mg IV lidocaine Complications:            No immediate complications. Estimated Blood Loss:     Estimated blood loss: none. Procedure:                Pre-Anesthesia Assessment:                           - Prior to the procedure, a History and Physical                            was performed, and patient medications and                            allergies were reviewed. The patient's tolerance of                            previous anesthesia was also reviewed. The risks                            and benefits of the procedure and the sedation                            options and risks were discussed with the patient.                            All questions were answered, and informed consent                            was obtained. Prior Anticoagulants: The patient has                            taken no previous anticoagulant or antiplatelet                            agents. ASA Grade Assessment: II - A patient with  mild systemic disease. After reviewing the risks                            and benefits, the patient was deemed in                            satisfactory condition to undergo the procedure.                           After obtaining informed consent, the scope was                            passed under direct vision. The [id number]                             scope was introduced through the anus and advanced                            to the the sigmoid colon. Unfortunately we could                            not advance this pediatric endoscope past the mass                            so we changed to the [id number] ([id number]) scope was                            introduced through the and advanced to the mass.                            After obtaining informed consent, the scope was                            passed under direct vision. The flexible                            sigmoidoscopy was technically difficult and complex                            due to abnormal anatomy, extrinsic compression and                            poor bowel prep. Successful completion of the                            procedure was aided by withdrawing the scope and                            replacing with the therapeutic endoscope. The                            quality of the bowel preparation was fair. The  patient tolerated the procedure well. Scope In: 12:52:02 PM Scope Out: 1:25:59 PM Total Procedure Duration: 0 hours 33 minutes 57 seconds  Findings:      An infiltrative partially obstructing large mass versus necrotic       stricture was found in the recto-sigmoid colon. The mass was       circumferential. No bleeding was present. The ERCP catheter loaded with       the JAG Jagwire was advanced through what we thought was the lumen and       dye was successfully injected with contrast for to prove we were in the       lumen which was confirmed under fluoroscopy guidance. And the wire was       advanced a good ways up the colon and more dye was injected to confirm       wire position and the catheter was removed and under fluoroscopy       guidance This was stented with a 22 mm x 9 cm WallFlex stent in the       customary fashion using both fluoroscopy and endoscopic observation for       release of the stent. It  seemed to be in excellent position based on       fluoroscopy and endoscopically and the short waist seemed to be in the       middle of the stent Estimated blood loss was minimal.      A moderate amount of semi-solid stool was found in the rectum,       interfering with visualization. Lavage of the area was performed using a       moderate amount of sterile water, resulting in incomplete clearance with       fair visualization. Impression:               - Preparation of the colon was fair.                           - Likely malignant partially obstructing tumor in                            the recto-sigmoid colon versus necrotic scarring.                            Injected with contrast proximal to obstruction to                            confirm proper position. Prosthesis placed.                           - Stool in the rectum.                           - No specimens collected. Moderate Sedation:      N/A- Per Anesthesia Care Recommendation:           - Clear liquid diet today. And may be tomorrow and                            continue MiraLAX 2-4 times a day and if doing well  in a few days may slowly advance diet                           - Continue present medications.                           - Return to GI clinic PRN.                           - Telephone GI clinic if symptomatic PRN. Procedure Code(s):        --- Professional ---                           [phone], Sigmoidoscopy, flexible; with placement of                            endoscopic stent (includes pre- and post-dilation                            and guide wire passage, when performed) Diagnosis Code(s):        --- Professional ---                           D49.0, Neoplasm of unspecified behavior of                            digestive system                           K56.60, Unspecified intestinal obstruction                           R93.3, Abnormal findings on diagnostic imaging of                             other parts of digestive tract CPT copyright 2016 [employer]. All rights reserved. The codes documented in this report are preliminary and upon coder review may  be revised to meet current compliance requirements. Clarene Essex, MD 08/11/2017 1:39:00 PM This report has been signed electronically. Number of Addenda: 0

## 2017-08-11 NOTE — Progress Notes (Signed)
   Subjective/Chief Complaint: Complains of some intermittent abd pain   Objective: Vital signs in last 24 hours: Temp:  [98.2 F (36.8 C)-98.6 F (37 C)] 98.6 F (37 C) (12/31 0515) Pulse Rate:  [81-99] 99 (12/31 0515) Resp:  [12-18] 18 (12/31 0515) BP: (125-152)/(74-85) 152/85 (12/31 0515) SpO2:  [93 %-97 %] 93 % (12/31 0515) Last BM Date: 08/10/17  Intake/Output from previous day: 12/30 0701 - 12/31 0700 In: 3611.7 [P.O.:1200; I.V.:2411.7] Out: 652 [Urine:651; Stool:1] Intake/Output this shift: Total I/O In: 0  Out: 250 [Urine:250]  General appearance: alert and cooperative Resp: clear to auscultation bilaterally Cardio: regular rate and rhythm GI: distended with mild tenderness  Lab Results:  No results for input(s): WBC, HGB, HCT, PLT in the last 72 hours. BMET Recent Labs    08/10/17 0547 08/11/17 0614  NA 138 137  K 3.5 3.1*  CL 103 104  CO2 29 28  GLUCOSE 108* 118*  BUN 22* 14  CREATININE 0.64 0.53  CALCIUM 8.5* 8.6*   PT/INR No results for input(s): LABPROT, INR in the last 72 hours. ABG No results for input(s): PHART, HCO3 in the last 72 hours.  Invalid input(s): PCO2, PO2  Studies/Results: No results found.  Anti-infectives: Anti-infectives (From admission, onward)   None      Assessment/Plan: s/p Procedure(s) with comments: COLONOSCOPY WITH PROPOFOL (N/A) - With Fluoroscopy for colonic stent placement for colonic stricture Plan for GI to attempt a palliative stent today  If not able to do then she will require surgical diversion. Will follow  LOS: 4 days    TOTH III,PAUL S 08/11/2017

## 2017-08-11 NOTE — Progress Notes (Signed)
Mary [name] 12:36 PM  Subjective: Patient seen and examined and hospital computer chart reviewed and case discussed with my partners and patient is stable in preop and we rediscussed the procedure and her condition  Objective: Vital signs stable afebrile no acute distress exam please see preassessment evaluation patient is slightly distended nontender rare bowel sound no new CBC  Assessment: Colonic obstruction questionable etiology  Plan: The risks benefits methods and success rate of stent placement was discussed including long-term risks of migration etc. and will proceed today with anesthesia assistance  [facility]  Pager [phone] After 5PM or if no answer call [phone]

## 2017-08-12 ENCOUNTER — Inpatient Hospital Stay (HOSPITAL_COMMUNITY): Payer: Medicare Other

## 2017-08-12 ENCOUNTER — Encounter (HOSPITAL_COMMUNITY): Payer: Self-pay | Admitting: Gastroenterology

## 2017-08-12 DIAGNOSIS — G4700 Insomnia, unspecified: Secondary | ICD-10-CM

## 2017-08-12 DIAGNOSIS — G893 Neoplasm related pain (acute) (chronic): Secondary | ICD-10-CM

## 2017-08-12 DIAGNOSIS — Z515 Encounter for palliative care: Secondary | ICD-10-CM

## 2017-08-12 LAB — BASIC METABOLIC PANEL
Anion gap: 6 (ref 5–15)
BUN: 9 mg/dL (ref 6–20)
CHLORIDE: 108 mmol/L (ref 101–111)
CO2: 27 mmol/L (ref 22–32)
CREATININE: 0.56 mg/dL (ref 0.44–1.00)
Calcium: 8.4 mg/dL — ABNORMAL LOW (ref 8.9–10.3)
GFR calc Af Amer: >60 mL/min (ref >60)
GFR calc non Af Amer: >60 mL/min (ref >60)
GLUCOSE: 92 mg/dL (ref 65–99)
Potassium: 3.3 mmol/L — ABNORMAL LOW (ref 3.5–5.1)
SODIUM: 141 mmol/L (ref 135–145)

## 2017-08-12 LAB — MAGNESIUM: MAGNESIUM: 1.5 mg/dL — AB (ref 1.7–2.4)

## 2017-08-12 MED ORDER — POTASSIUM CHLORIDE CRYS ER 20 MEQ PO TBCR
40.0000 meq | EXTENDED_RELEASE_TABLET | ORAL | Status: AC
  Administered 2017-08-12 (×2): 40 meq via ORAL
  Filled 2017-08-12 (×2): qty 2

## 2017-08-12 MED ORDER — TRAZODONE HCL 50 MG PO TABS
50.0000 mg | ORAL_TABLET | Freq: Every evening | ORAL | Status: DC | PRN
  Administered 2017-08-13 – 2017-08-14 (×2): 50 mg via ORAL
  Filled 2017-08-12 (×2): qty 1

## 2017-08-12 MED ORDER — MAGNESIUM SULFATE 2 GM/50ML IV SOLN
2.0000 g | Freq: Once | INTRAVENOUS | Status: AC
  Administered 2017-08-12: 2 g via INTRAVENOUS
  Filled 2017-08-12: qty 50

## 2017-08-12 MED ORDER — HYDROMORPHONE HCL 1 MG/ML IJ SOLN
0.5000 mg | INTRAMUSCULAR | Status: DC | PRN
  Administered 2017-08-13 – 2017-08-16 (×7): 0.5 mg via INTRAVENOUS
  Filled 2017-08-12 (×7): qty 0.5

## 2017-08-12 MED ORDER — DIPHENHYDRAMINE HCL 12.5 MG/5ML PO ELIX
12.5000 mg | ORAL_SOLUTION | Freq: Once | ORAL | Status: AC
  Administered 2017-08-12: 12.5 mg via ORAL
  Filled 2017-08-12: qty 5

## 2017-08-12 MED ORDER — LOSARTAN POTASSIUM 50 MG PO TABS: 100.0000 mg | ORAL_TABLET | Freq: Every day | ORAL | Status: DC

## 2017-08-12 MED ORDER — LOSARTAN POTASSIUM 50 MG PO TABS
100.0000 mg | ORAL_TABLET | Freq: Every day | ORAL | Status: DC
  Administered 2017-08-12 – 2017-08-16 (×5): 100 mg via ORAL
  Filled 2017-08-12 (×6): qty 2

## 2017-08-12 NOTE — Progress Notes (Signed)
[facility] PROGRESS NOTE  Mary [name] OXB:[id number] DOB: 02/07/1933 DOA: 08/07/2017  PCP: Moshe Cipro, MD  Brief History/Interval Summary: 84 year old female with atrial fibrillation not on anticoagulation, diagnosed in January of this year with colonic mass, metastatic colon cancer, later started on palliative chemotherapy with Xeloda and an anti-EGFR Ab with Dr. Burr Medico, has excellent functional status at present.  Now in last week, became constipated, then vomiting, bloating.  On arival to ER, was found on CT to have colonic stricture and obstruction, Gen Surg consulted, placed on PCA.  Gastroenterology was subsequently consulted.  Reason for Visit: Chronic stricture due to malignancy  Consultants: General surgery.  Gastroenterology.  Oncology.  Procedures:  Flexible sigmoidoscopy with stent placement 12/31 Findings:      An infiltrative partially obstructing large mass versus necrotic       stricture was found in the recto-sigmoid colon. The mass was       circumferential. No bleeding was present. The ERCP catheter loaded with       the JAG Jagwire was advanced through what we thought was the lumen and       dye was successfully injected with contrast for to prove we were in the       lumen which was confirmed under fluoroscopy guidance. And the wire was       advanced a good ways up the colon and more dye was injected to confirm       wire position and the catheter was removed and under fluoroscopy       guidance This was stented with a 22 mm x 9 cm WallFlex stent in the       customary fashion using both fluoroscopy and endoscopic observation for       release of the stent. It seemed to be in excellent position based on       fluoroscopy and endoscopically and the short waist seemed to be in the       middle of the stent Estimated blood loss was minimal.      A moderate amount of semi-solid stool was found in the rectum,       interfering with visualization. Lavage  of the area was performed using a       moderate amount of sterile water, resulting in incomplete clearance with       fair visualization. Impression:               - Preparation of the colon was fair.                           - Likely malignant partially obstructing tumor in                            the recto-sigmoid colon versus necrotic scarring.                            Injected with contrast proximal to obstruction to                            confirm proper position. Prosthesis placed.                           - Stool in the rectum.                           -  No specimens collected.   Antibiotics: None  Subjective/Interval History: Patient has had multiple bowel movements ever since her colon stent placement yesterday.  Abdomen feels better.  Denies any nausea vomiting.    ROS: Denies any chest pain or shortness of breath.  Objective:  Vital Signs  Vitals:   08/11/17 1355 08/11/17 1400 08/11/17 2139 08/12/17 0603  BP:  (!) 157/96 (!) 156/77 (!) 163/76  Pulse: 99 100 (!) 107 99  Resp: (!) 22 (!) '22 20 20  '$ Temp:   97.6 F (36.4 C) 97.7 F (36.5 C)  TempSrc:   Oral Oral  SpO2: 92% 92% 96% 98%  Weight:      Height:        Intake/Output Summary (Last 24 hours) at 08/12/2017 1016 Last data filed at 08/12/2017 0838 Gross per 24 hour  Intake 2916.67 ml  Output 950 ml  Net 1966.67 ml   Filed Weights   08/07/17 1449  Weight: 81.2 kg (179 lb)    General appearance: Awake alert.  No distress Resp: Clear to auscultation bilaterally Cardio: S1-S2 is normal regular GI: Abdomen is soft today.  No masses organomegaly.  Mildly tender in the lower quadrants without any rebound rigidity or guarding.  Bowel sounds are present.   Neurologic: No focal deficits.  Lab Results:  Data Reviewed: I have personally reviewed following labs and imaging studies  CBC: Recent Labs  Lab 08/07/17 1248 08/08/17 0542  WBC 18.2* 19.6*  NEUTROABS 15.7*  --   HGB 14.9 12.5  HCT  46.3 39.1  MCV 93.6 95.1  PLT 387 106    Basic Metabolic Panel: Recent Labs  Lab 08/08/17 0542 08/09/17 0554 08/10/17 0547 08/11/17 0614 08/12/17 0618  NA 142 141 138 137 141  K 3.4* 3.6 3.5 3.1* 3.3*  CL 110 108 103 104 108  CO2 '26 28 29 28 27  '$ GLUCOSE 152* 128* 108* 118* 92  BUN 24* 30* 22* 14 9  CREATININE 0.82 0.74 0.64 0.53 0.56  CALCIUM 8.1* 8.7* 8.5* 8.6* 8.4*  MG  --   --   --   --  1.5*    GFR: Estimated Creatinine Clearance: 58.3 mL/min (by C-G formula based on SCr of 0.56 mg/dL).  Liver Function Tests: Recent Labs  Lab 08/07/17 1248 08/08/17 0542  AST 53* 149*  ALT 48 91*  ALKPHOS 121 96  BILITOT 1.25* 1.5*  PROT 8.1 6.3*  ALBUMIN 3.9 3.2*     Radiology Studies: Dg C-arm 1-60 Min  Result Date: 08/11/2017 CLINICAL DATA:  Distal colon stricture. EXAM: DG C-ARM 61-120 MIN COMPARISON:  CT scan dated 08/07/2017 FINDINGS: C-arm imaging was utilized to place a stent in the distal colon. IMPRESSION: Stent placed in the distal colon. Electronically Signed   By: Lorriane Shire M.D.   On: 08/11/2017 13:57     Medications:  Scheduled: . amLODipine  5 mg Oral Daily  . diltiazem  240 mg Oral Daily  . latanoprost  1 drop Both Eyes QHS  . polyethylene glycol  17 g Oral BID  . potassium chloride  40 mEq Oral Q4H   Continuous: . lactated ringers 100 mL/hr at 08/12/17 0825   YIR:[id number] **OR** acetaminophen, HYDROmorphone (DILAUDID) injection, metoprolol tartrate, ondansetron (ZOFRAN) IV, polyvinyl alcohol  Assessment/Plan:  Principal Problem:   Rectosigmoid stricture from obstructing cancer s/p endoscopic stenting 08/11/2017 Active Problems:   Cancer of left colon (HCC)   Persistent atrial fibrillation (HCC)   Essential hypertension   Goals of care, counseling/discussion  Hyperglycemia     Malignant colonic stricture Patient seen by gastroenterology and general surgery.  Patient underwent colonic stent placement on 12/31.  She has having  multiple bowel movements.  Feels better.  Await further GI input.  Continue MiraLAX for now.  Wait for diet to be advanced by gastroenterology.  Metastatic colon cancer Management per oncology as outpatient.  History of essential hypertension Continue home medications.  The patient noted to be elevated.  May need to adjust her medications.    History of paroxysmal atrial fibrillation Not on anticoagulation.  Monitor heart rate.  Hypokalemia and hypomagnesemia Replete   DVT Prophylaxis: SCDs    Code Status: DNR Family Communication: Discussed with the patient and her daughter-in-law Disposition Plan: In by physical therapy.  Continue to mobilize.  Anticipate discharge in the next 1-2 days.    LOS: 5 days   [facility] Pager [phone] 08/12/2017, 10:16 AM  If 7PM-7AM, please contact night-coverage at [email or web address], password [email or web address]

## 2017-08-12 NOTE — Evaluation (Signed)
Physical Therapy One Time Evaluation Patient Details Name: Mary [name] MRN: [id number] DOB: 02/07/1933 Today's Date: 08/12/2017   History of Present Illness  84 year old female with atrial fibrillation not on anticoagulation, diagnosed in January of this year with colonic mass, metastatic colon cancer, later started on palliative chemotherapy and typically independent at baseline. CT: colonic stricture and obstruction.  Pt s/p Flexible sigmoidoscopy with stent placement 12/31  Clinical Impression  Patient evaluated by Physical Therapy with no further acute PT needs identified. All education has been completed and the patient has no further questions.  Pt mobilizing well in hallway and encouraged to continue ambulating 3-4 times a day with her family.  Pt typically very independent at baseline. See below for any follow-up Physical Therapy or equipment needs. PT is signing off. Thank you for this referral.     Follow Up Recommendations No PT follow up    Equipment Recommendations  Other (comment)(family/pt may request cane or RW if pt requiring more support upon d/c home)    Recommendations for Other Services       Precautions / Restrictions Precautions Precautions: None      Mobility  Bed Mobility Overal bed mobility: Modified Independent                Transfers Overall transfer level: Needs assistance Equipment used: None Transfers: Sit to/from Stand Sit to Stand: Min guard;Supervision         General transfer comment: utilizes UEs to self assist  Ambulation/Gait Ambulation/Gait assistance: Min guard;Supervision Ambulation Distance (Feet): 200 Feet Assistive device: None Gait Pattern/deviations: Step-through pattern;Decreased stride length     General Gait Details: pt pushed IV pole, tolerated distance well   Pt ambulated into bathroom and used toilet and washed hands with supervision (only for safety).  Stairs            Wheelchair Mobility     Modified Rankin (Stroke Patients Only)       Balance Overall balance assessment: Needs assistance         Standing balance support: No upper extremity supported Standing balance-Leahy Scale: Good Standing balance comment: at least good static standing balance observed                             Pertinent Vitals/Pain Pain Assessment: No/denies pain    Home Living Family/patient expects to be discharged to:: Private residence Living Arrangements: Alone Available Help at Discharge: Family;Available PRN/intermittently Type of Home: House Home Access: Ramped entrance     Home Layout: One level Home Equipment: Wheelchair - manual Additional Comments: pt has accessible home due to hx of her spouse (now deceased) having CVA and w/c bound    Prior Function Level of Independence: Independent               Hand Dominance        Extremity/Trunk Assessment        Lower Extremity Assessment Lower Extremity Assessment: Overall WFL for tasks assessed    Cervical / Trunk Assessment Cervical / Trunk Assessment: Normal  Communication   Communication: No difficulties  Cognition Arousal/Alertness: Awake/alert Behavior During Therapy: WFL for tasks assessed/performed Overall Cognitive Status: Within Functional Limits for tasks assessed                                        General Comments  Exercises     Assessment/Plan    PT Assessment Patent does not need any further PT services  PT Problem List         PT Treatment Interventions      PT Goals (Current goals can be found in the Care Plan section)  Acute Rehab PT Goals PT Goal Formulation: All assessment and education complete, DC therapy    Frequency     Barriers to discharge        Co-evaluation               AM-PAC PT "6 Clicks" Daily Activity  Outcome Measure Difficulty turning over in bed (including adjusting bedclothes, sheets and blankets)?:  None Difficulty moving from lying on back to sitting on the side of the bed? : None Difficulty sitting down on and standing up from a chair with arms (e.g., wheelchair, bedside commode, etc,.)?: None Help needed moving to and from a bed to chair (including a wheelchair)?: None Help needed walking in hospital room?: A Little Help needed climbing 3-5 steps with a railing? : A Little 6 Click Score: 22    End of Session Equipment Utilized During Treatment: Gait belt Activity Tolerance: Patient tolerated treatment well Patient left: in chair;with call bell/phone within reach;with family/visitor present Nurse Communication: Mobility status PT Visit Diagnosis: Difficulty in walking, not elsewhere classified (R26.2)    Time: 8657-8469 PT Time Calculation (min) (ACUTE ONLY): 18 min   Charges:   PT Evaluation $PT Eval Low Complexity: 1 Low     PT G CodesCarmelia Bake, PT, DPT 08/12/2017 Pager: [phone]  York Ram E 08/12/2017, 12:31 PM

## 2017-08-12 NOTE — Progress Notes (Signed)
[facility] PROGRESS NOTE Subjective Pt doing well per family with multple loose stools no pain  Objective: Vital signs in last 24 hours: Temp:  [97.6 F (36.4 C)-97.8 F (36.6 C)] 97.7 F (36.5 C) (01/01 0603) Pulse Rate:  [92-107] 99 (01/01 0603) Resp:  [20-23] 20 (01/01 0603) BP: (156-174)/(74-96) 163/76 (01/01 0603) SpO2:  [91 %-100 %] 98 % (01/01 0603) Last BM Date: 08/10/17  Intake/Output from previous day: 12/31 0701 - 01/01 0700 In: 2436.7 [I.V.:2236.7; IV Piggyback:200] Out: 1450 [Urine:500; Stool:950] Intake/Output this shift: Total I/O In: 720 [P.O.:720] Out: -   PE: General--walking in hall no pain  Abdomen--soft nontender nondistended  Lab Results: No results for input(s): WBC, HGB, HCT, PLT in the last 72 hours. BMET Recent Labs    08/10/17 0547 08/11/17 0614 08/12/17 0618  NA 138 137 141  K 3.5 3.1* 3.3*  CL 103 104 108  CO2 29 28 27   CREATININE 0.64 0.53 0.56   LFT No results for input(s): PROT, AST, ALT, ALKPHOS, BILITOT, BILIDIR, IBILI in the last 72 hours. PT/INR No results for input(s): LABPROT, INR in the last 72 hours. PANCREAS No results for input(s): LIPASE in the last 72 hours.       Studies/Results: Dg C-arm 1-60 Min  Result Date: 08/11/2017 CLINICAL DATA:  Distal colon stricture. EXAM: DG C-ARM 61-120 MIN COMPARISON:  CT scan dated 08/07/2017 FINDINGS: C-arm imaging was utilized to place a stent in the distal colon. IMPRESSION: Stent placed in the distal colon. Electronically Signed   By: Lorriane Shire M.D.   On: 08/11/2017 13:57    Medications: I have reviewed the patient's current medications.  Assessment:   1. Colonic Obstruction due to colon cancer s/p endoscopic stent placement doing well.   Plan: Low residue diet and chronic miralax. Discussed in detail with pts daughter in law. Will advance to full liquids and should be able to be discharged soon. Will advance to full liquid diet now.   Nancy Fetter 08/12/2017, 1:09 PM  This note was created using voice recognition software. Minor errors may Have occurred unintentionally.  Pager: [phone] If no answer or after hours call [phone]

## 2017-08-12 NOTE — Progress Notes (Signed)
OT Cancellation Note  Patient Details Name: Mary [name] MRN: [id number] DOB: 02/07/1933   Cancelled Treatment:    Reason Eval/Treat Not Completed: OT screened, no needs identified, will sign off. Pt is performing ADL independently per PT.   [name] 08/12/2017, 11:56 AM  08/12/2017 [name], OTR/L Pager: [phone]

## 2017-08-12 NOTE — Progress Notes (Addendum)
PMT progress note Reason for visit: goals of care in light of metastatic disease, pain and symptom management  Patient is resting comfortably.  Dtr in law at bedside.  She reports that her pain is currently well controlled.  Has not needed any pain medications today.  Multiple BMs since stent placement.  Discussed again about her clinical course and the goal of interventions to be to add as much time and quality to her life as possible.  She would like to discuss long term goals with Dr. Burr Medico prior to any discussion with our team.  Her only concern today is poor sleep.  Reports that she had ambien in the past that made her "loopy."  Occasionally uses benadryl at home, but reports that her PCP told her that is possible to increase fall risk.  We discussed alternatives and she is agreeable to trial of trazodone.   BP (!) 163/76 (BP Location: Right Arm)   Pulse 99   Temp 97.7 F (36.5 C) (Oral)   Resp 20   Ht 5\' 6"  (1.676 m)   Wt 81.2 kg (179 lb)   SpO2 98%   BMI 28.89 kg/m  Labs and imaging noted  Awake alert Hard of hearing No distress Regular S1 S2 Mild gen abd tenderness No edema  Colon obstruction Cancer of colon A fib HTN Goals of care discussions ongoing  S/P colonic stenting on 12-31.  Reports feeling much improved overall.   Continue as needed dilaudid for pain.  Has not required any for the last 24 hours.  Trial of trazodone for insomnia.  Would like to see Dr. Burr Medico again proior to any discussions about long term goals of care.   Total time: 40 minutes Greater than 50%  of this time was spent counseling and coordinating care related to the above assessment and plan.  Micheline Rough, MD [facility] [phone]

## 2017-08-13 LAB — BASIC METABOLIC PANEL
ANION GAP: 4 — AB (ref 5–15)
BUN: 5 mg/dL — ABNORMAL LOW (ref 6–20)
CO2: 28 mmol/L (ref 22–32)
CREATININE: 0.68 mg/dL (ref 0.44–1.00)
Calcium: 8.4 mg/dL — ABNORMAL LOW (ref 8.9–10.3)
Chloride: 109 mmol/L (ref 101–111)
GFR calc non Af Amer: >60 mL/min (ref >60)
Glucose, Bld: 95 mg/dL (ref 65–99)
Potassium: 3.1 mmol/L — ABNORMAL LOW (ref 3.5–5.1)
Sodium: 141 mmol/L (ref 135–145)

## 2017-08-13 LAB — CBC
HEMATOCRIT: 36.5 % (ref 36.0–46.0)
HEMOGLOBIN: 12.1 g/dL (ref 12.0–15.0)
MCH: 30.6 pg (ref 26.0–34.0)
MCHC: 33.2 g/dL (ref 30.0–36.0)
MCV: 92.4 fL (ref 78.0–100.0)
Platelets: 315 10*3/uL (ref 150–400)
RBC: 3.95 MIL/uL (ref 3.87–5.11)
RDW: 18.1 % — ABNORMAL HIGH (ref 11.5–15.5)
WBC: 9.9 10*3/uL (ref 4.0–10.5)

## 2017-08-13 LAB — MAGNESIUM: Magnesium: 1.7 mg/dL (ref 1.7–2.4)

## 2017-08-13 MED ORDER — LORATADINE 10 MG PO TABS
10.0000 mg | ORAL_TABLET | Freq: Every day | ORAL | Status: DC
  Administered 2017-08-13 – 2017-08-16 (×4): 10 mg via ORAL
  Filled 2017-08-13 (×4): qty 1

## 2017-08-13 MED ORDER — POLYETHYLENE GLYCOL 3350 17 G PO PACK: 17.0000 g | PACK | Freq: Every day | ORAL | 0 refills | Status: AC | PRN

## 2017-08-13 MED ORDER — MAGNESIUM SULFATE 2 GM/50ML IV SOLN
2.0000 g | Freq: Once | INTRAVENOUS | Status: AC
  Administered 2017-08-13: 2 g via INTRAVENOUS
  Filled 2017-08-13: qty 50

## 2017-08-13 MED ORDER — POLYETHYLENE GLYCOL 3350 17 G PO PACK
17.0000 g | PACK | Freq: Every day | ORAL | Status: DC
  Filled 2017-08-13: qty 1

## 2017-08-13 MED ORDER — DOCUSATE SODIUM 100 MG PO CAPS: 100.0000 mg | ORAL_CAPSULE | Freq: Two times a day (BID) | ORAL | 0 refills | Status: AC

## 2017-08-13 MED ORDER — POTASSIUM CHLORIDE ER 20 MEQ PO TBCR: 40.0000 meq | EXTENDED_RELEASE_TABLET | Freq: Every day | ORAL | 0 refills | Status: DC

## 2017-08-13 MED ORDER — POTASSIUM CHLORIDE CRYS ER 20 MEQ PO TBCR
40.0000 meq | EXTENDED_RELEASE_TABLET | ORAL | Status: AC
  Administered 2017-08-13 (×2): 40 meq via ORAL
  Filled 2017-08-13 (×2): qty 2

## 2017-08-13 NOTE — Progress Notes (Signed)
[facility] PROGRESS NOTE  Mary [name] OXB:[id number] DOB: 02/07/1933 DOA: 08/07/2017  PCP: Moshe Cipro, MD  Brief History/Interval Summary: 84 year old female with atrial fibrillation not on anticoagulation, diagnosed in January of this year with colonic mass, metastatic colon cancer, later started on palliative chemotherapy with Xeloda and an anti-EGFR Ab with Dr. Burr Medico, has excellent functional status at present.  Now in last week, became constipated, then vomiting, bloating.  On arival to ER, was found on CT to have colonic stricture and obstruction, Gen Surg consulted, placed on PCA.  Gastroenterology was subsequently consulted.  Reason for Visit: Chronic stricture due to malignancy  Consultants: General surgery.  Gastroenterology.  Oncology.  Procedures:  Flexible sigmoidoscopy with stent placement 12/31 Findings:      An infiltrative partially obstructing large mass versus necrotic       stricture was found in the recto-sigmoid colon. The mass was       circumferential. No bleeding was present. The ERCP catheter loaded with       the JAG Jagwire was advanced through what we thought was the lumen and       dye was successfully injected with contrast for to prove we were in the       lumen which was confirmed under fluoroscopy guidance. And the wire was       advanced a good ways up the colon and more dye was injected to confirm       wire position and the catheter was removed and under fluoroscopy       guidance This was stented with a 22 mm x 9 cm WallFlex stent in the       customary fashion using both fluoroscopy and endoscopic observation for       release of the stent. It seemed to be in excellent position based on       fluoroscopy and endoscopically and the short waist seemed to be in the       middle of the stent Estimated blood loss was minimal.      A moderate amount of semi-solid stool was found in the rectum,       interfering with visualization. Lavage  of the area was performed using a       moderate amount of sterile water, resulting in incomplete clearance with       fair visualization. Impression:               - Preparation of the colon was fair.                           - Likely malignant partially obstructing tumor in                            the recto-sigmoid colon versus necrotic scarring.                            Injected with contrast proximal to obstruction to                            confirm proper position. Prosthesis placed.                           - Stool in the rectum.                           -  No specimens collected.   Antibiotics: None  Subjective/Interval History: Patient continues to have multiple bowel movements.  She had to wake up frequently in the nighttime to go to the bathroom.  Mild abdominal pain.  No nausea vomiting.  Tolerating her diet.    ROS: Denies any chest pain or shortness of breath.  Objective:  Vital Signs  Vitals:   08/12/17 0603 08/12/17 1300 08/12/17 2035 08/13/17 0608  BP: (!) 163/76 139/77 (!) 146/70 (!) 157/77  Pulse: 99 86 81 95  Resp: _0 Temp: 97.7 F (36.5 C) 98.3 F (36.8 C) 98.2 F (36.8 C) 98.8 F (37.1 C)  TempSrc: Oral Oral Oral Oral  SpO2: 98% 97% 97% 97%  Weight:      Height:        Intake/Output Summary (Last 24 hours) at 08/13/2017 1338 Last data filed at 08/13/2017 0930 Gross per 24 hour  Intake 2181.66 ml  Output 3 ml  Net 2178.66 ml   Filed Weights   08/07/17 1449  Weight: 81.2 kg (179 lb)    General appearance: Awake alert.  In no distress Resp: Clear to auscultation bilaterally Cardio: S1-S2 is normal regular. GI: Abdomen remains soft.  No masses organomegaly.  Mildly tender in the lower quadrants without any rebound rigidity or guarding.  Bowel sounds are present.   Neurologic: No focal deficits.  Lab Results:  Data Reviewed: I have personally reviewed following labs and imaging studies  CBC: Recent Labs  Lab  08/07/17 1248 08/08/17 0542 08/13/17 0638  WBC 18.2* 19.6* 9.9  NEUTROABS 15.7*  --   --   HGB 14.9 12.5 12.1  HCT 46.3 39.1 36.5  MCV 93.6 95.1 92.4  PLT 387 332 335    Basic Metabolic Panel: Recent Labs  Lab 08/09/17 0554 08/10/17 0547 08/11/17 0614 08/12/17 0618 08/13/17 0638  NA 141 138 137 141 141  K 3.6 3.5 3.1* 3.3* 3.1*  CL 108 103 104 108 109  CO2 _1 GLUCOSE 128* 108* 118* 92 95  BUN 30* 22* 14 9 5*  CREATININE 0.74 0.64 0.53 0.56 0.68  CALCIUM 8.7* 8.5* 8.6* 8.4* 8.4*  MG  --   --   --  1.5* 1.7    GFR: Estimated Creatinine Clearance: 58.3 mL/min (by C-G formula based on SCr of 0.68 mg/dL).  Liver Function Tests: Recent Labs  Lab 08/07/17 1248 08/08/17 0542  AST 53* 149*  ALT 48 91*  ALKPHOS 121 96  BILITOT 1.25* 1.5*  PROT 8.1 6.3*  ALBUMIN 3.9 3.2*     Radiology Studies: Dg Abd 1 View - Kub  Result Date: 08/12/2017 CLINICAL DATA:  Colon stricture.  Tenderness of the abdomen. EXAM: ABDOMEN - 1 VIEW COMPARISON:  08/07/2017.  Fluoroscopy 08/11/2017 FINDINGS: Colonic wall stent is demonstrated in the pelvis. Scattered gas-filled bowel loops in the abdomen without abnormal distention. Stool throughout the colon. Calcification in the right upper quadrant may represent calcified gallstone or gallbladder wall calcification. No radiopaque stones. Degenerative changes in the spine and hips. IMPRESSION: Colonic wall stent demonstrated in the pelvis. Nonobstructive bowel gas pattern. Electronically Signed   By: Lucienne Capers M.D.   On: 08/12/2017 20:36   Dg C-arm 1-60 Min  Result Date: 08/11/2017 CLINICAL DATA:  Distal colon stricture. EXAM: DG C-ARM 61-120 MIN COMPARISON:  CT scan dated 08/07/2017 FINDINGS: C-arm imaging was utilized to place a stent in the distal colon. IMPRESSION: Stent placed in the distal colon. Electronically Signed  By: Lorriane Shire M.D.   On: 08/11/2017 13:57     Medications:  Scheduled: . amLODipine  5 mg Oral  Daily  . diltiazem  240 mg Oral Daily  . latanoprost  1 drop Both Eyes QHS  . loratadine  10 mg Oral Daily  . losartan  100 mg Oral Daily  . polyethylene glycol  17 g Oral BID   Continuous: . lactated ringers 50 mL/hr at 08/12/17 2302   JOI:NOMVEHMCNOBSJ **OR** acetaminophen, HYDROmorphone (DILAUDID) injection, metoprolol tartrate, ondansetron (ZOFRAN) IV, polyvinyl alcohol, traZODone  Assessment/Plan:  Principal Problem:   Rectosigmoid stricture from obstructing cancer s/p endoscopic stenting 08/11/2017 Active Problems:   Cancer of left colon (HCC)   Persistent atrial fibrillation (HCC)   Essential hypertension   Goals of care, counseling/discussion   Hyperglycemia     Malignant colonic stricture Patient seen by gastroenterology and general surgery.  Patient underwent colonic stent placement on 12/31.  Patient continues to have multiple bowel movements.  Discussed with gastroenterology (Dr. Oletta Lamas).  We will back off on the MiraLAX.  Continue to monitor her in the hospital for now.  May need further workup if she does not improve.    Metastatic colon cancer Management per oncology as outpatient.  History of essential hypertension Continue home medications.  Continue to monitor blood pressure  History of paroxysmal atrial fibrillation Not on anticoagulation.  Monitor heart rate.  Hypokalemia and hypomagnesemia Will be repleted.  Repeat labs tomorrow.   DVT Prophylaxis: SCDs    Code Status: DNR Family Communication: Discussed with the patient and her daughter-in-law Disposition Plan: Management as outlined above.    LOS: 6 days   [facility] Pager [phone] 08/13/2017, 1:38 PM  If 7PM-7AM, please contact night-coverage at [email or web address], password [email or web address]

## 2017-08-13 NOTE — Progress Notes (Signed)
General Surgery [facility]  Patient known to me from ER visit at admission.  Doing well with stent in place.  No role for surgical intervention at this point.  Will sign off.  Call if needed.  Armandina Gemma, [facility] Office: [phone]

## 2017-08-13 NOTE — Progress Notes (Signed)
PMT progress note Reason for visit: goals of care in light of metastatic disease, pain and symptom management  Patient is resting comfortably in bed.  Lights off and no family present.  She reports that her pain is currently well controlled.  Has had only one dose of pain medication today.    Her only concern today is "getting some rest."  Yesterday discussed trial of trazodone but she did not have a dose last night.   BP (!) 148/77 (BP Location: Left Arm)   Pulse 98   Temp 98.1 F (36.7 C) (Oral)   Resp 20   Ht 5\' 6"  (1.676 m)   Wt 81.2 kg (179 lb)   SpO2 97%   BMI 28.89 kg/m  Labs and imaging noted  Awake alert Hard of hearing No distress Regular S1 S2 Mild gen abd tenderness No edema  Colon obstruction Cancer of colon A fib HTN Goals of care discussions ongoing  S/P colonic stenting on 12-31.  Reports feeling much improved overall.   Continue as needed dilaudid for pain.  One dose in the last 24 hours.  Trial of trazodone for insomnia.  Would like to see Dr. Burr Medico again proior to any discussions about long term goals of care.   Total time: 20 minutes Greater than 50%  of this time was spent counseling and coordinating care related to the above assessment and plan.  Micheline Rough, MD [place] Team [phone]

## 2017-08-13 NOTE — Care Management Note (Signed)
Case Management Note  Patient Details  Name: Mary [name] MRN: [id number] Date of Birth: 02/07/1933  Subjective/Objective: Provided patient/son/dtr in law w/HHC agency list-recc HHRN-assess skin,signs & symptoms,v/s, & HH aide. Await choice. Encouraged private duty also(independent decision).                    Action/Plan:d/c plan home w/HHC.   Expected Discharge Date:  (unknown)               Expected Discharge Plan:  [place]  In-House Referral:     Discharge planning Services  CM Consult  Post Acute Care Choice:    Choice offered to:  Adult Children  DME Arranged:    DME Agency:     HH Arranged:    HH Agency:     Status of Service:  In process, will continue to follow  If discussed at Long Length of Stay Meetings, dates discussed:    Additional Comments:  Dessa Phi, RN 08/13/2017, 3:09 PM

## 2017-08-13 NOTE — Care Management Note (Signed)
Case Management Note  Patient Details  Name: Mary [name] MRN: [id number] Date of Birth: 02/07/1933  Subjective/Objective:  Son chose [facility] [place] rep-able to accept-faxed [place] aide order, & demographic sheet.                  Action/Plan:d/c home w/HHC.   Expected Discharge Date:  (unknown)               Expected Discharge Plan:  [place]  In-House Referral:     Discharge planning Services  CM Consult  Post Acute Care Choice:    Choice offered to:  Adult Children  DME Arranged:    DME Agency:     HH Arranged:  RN, Nurse's Aide [place] Agency:  [facility]  Status of Service:  In process, will continue to follow  If discussed at Long Length of Stay Meetings, dates discussed:    Additional Comments:  Dessa Phi, RN 08/13/2017, 3:57 PM

## 2017-08-14 DIAGNOSIS — R197 Diarrhea, unspecified: Secondary | ICD-10-CM

## 2017-08-14 DIAGNOSIS — K56609 Unspecified intestinal obstruction, unspecified as to partial versus complete obstruction: Secondary | ICD-10-CM

## 2017-08-14 DIAGNOSIS — I10 Essential (primary) hypertension: Secondary | ICD-10-CM

## 2017-08-14 DIAGNOSIS — Z7189 Other specified counseling: Secondary | ICD-10-CM

## 2017-08-14 DIAGNOSIS — C186 Malignant neoplasm of descending colon: Secondary | ICD-10-CM

## 2017-08-14 LAB — URINALYSIS, ROUTINE W REFLEX MICROSCOPIC
Bacteria, UA: NONE SEEN
Bilirubin Urine: NEGATIVE
Glucose, UA: NEGATIVE mg/dL
Ketones, ur: NEGATIVE mg/dL
LEUKOCYTES UA: NEGATIVE
Nitrite: NEGATIVE
Protein, ur: NEGATIVE mg/dL
SPECIFIC GRAVITY, URINE: 1.004 — AB (ref 1.005–1.030)
pH: 7 (ref 5.0–8.0)

## 2017-08-14 LAB — COMPREHENSIVE METABOLIC PANEL
ALT: 38 U/L (ref 14–54)
AST: 25 U/L (ref 15–41)
Albumin: 2.5 g/dL — ABNORMAL LOW (ref 3.5–5.0)
Alkaline Phosphatase: 81 U/L (ref 38–126)
Anion gap: 6 (ref 5–15)
BUN: <5 mg/dL — ABNORMAL LOW (ref 6–20)
CHLORIDE: 110 mmol/L (ref 101–111)
CO2: 24 mmol/L (ref 22–32)
CREATININE: 0.67 mg/dL (ref 0.44–1.00)
Calcium: 8.1 mg/dL — ABNORMAL LOW (ref 8.9–10.3)
Glucose, Bld: 94 mg/dL (ref 65–99)
POTASSIUM: 3 mmol/L — AB (ref 3.5–5.1)
Sodium: 140 mmol/L (ref 135–145)
Total Bilirubin: 0.9 mg/dL (ref 0.3–1.2)
Total Protein: 5.1 g/dL — ABNORMAL LOW (ref 6.5–8.1)

## 2017-08-14 LAB — CBC
HEMATOCRIT: 36 % (ref 36.0–46.0)
HEMOGLOBIN: 12 g/dL (ref 12.0–15.0)
MCH: 30.5 pg (ref 26.0–34.0)
MCHC: 33.3 g/dL (ref 30.0–36.0)
MCV: 91.4 fL (ref 78.0–100.0)
Platelets: 317 10*3/uL (ref 150–400)
RBC: 3.94 MIL/uL (ref 3.87–5.11)
RDW: 18.2 % — AB (ref 11.5–15.5)
WBC: 10.9 10*3/uL — AB (ref 4.0–10.5)

## 2017-08-14 LAB — C DIFFICILE QUICK SCREEN W PCR REFLEX
C Diff antigen: NEGATIVE
C Diff interpretation: NOT DETECTED
C Diff toxin: NEGATIVE

## 2017-08-14 LAB — MAGNESIUM: MAGNESIUM: 1.6 mg/dL — AB (ref 1.7–2.4)

## 2017-08-14 MED ORDER — POTASSIUM CHLORIDE 10 MEQ/100ML IV SOLN
10.0000 meq | INTRAVENOUS | Status: AC
  Administered 2017-08-14 (×3): 10 meq via INTRAVENOUS
  Filled 2017-08-14 (×3): qty 100

## 2017-08-14 MED ORDER — POTASSIUM CHLORIDE CRYS ER 20 MEQ PO TBCR: 40.0000 meq | EXTENDED_RELEASE_TABLET | ORAL | Status: DC

## 2017-08-14 MED ORDER — POTASSIUM CHLORIDE CRYS ER 20 MEQ PO TBCR
40.0000 meq | EXTENDED_RELEASE_TABLET | Freq: Two times a day (BID) | ORAL | Status: AC
  Administered 2017-08-14 – 2017-08-15 (×3): 40 meq via ORAL
  Filled 2017-08-14 (×3): qty 2

## 2017-08-14 MED ORDER — MAGNESIUM SULFATE 2 GM/50ML IV SOLN
2.0000 g | Freq: Once | INTRAVENOUS | Status: AC
  Administered 2017-08-14: 2 g via INTRAVENOUS
  Filled 2017-08-14: qty 50

## 2017-08-14 NOTE — Progress Notes (Signed)
PROGRESS NOTE    Mary [name]  QPY:[id number] DOB: 02/07/1933 DOA: 08/07/2017 PCP: Moshe Cipro, MD   Brief Narrative: 84 year old female with atrial fibrillation not on anticoagulation, diagnosed in January of this year with colonic mass, metastatic colon cancer, later started on palliative chemotherapy with Xeloda and an anti-EGFR Ab with Dr. Burr Medico, has excellent functional status at present. Now in last week, became constipated, then vomiting, bloating. On arival to ER, was found on CT to have colonic stricture and obstruction, Gen Surg consulted, placed on PCA.  Gastroenterology was subsequently consulted.    Assessment & Plan:  # Malignant colonic stricture status post colonic a stent placement on December 31st . Evaluation ongoing by surgery and GI. Patient is now having very loose bowel movement therefore MiraLAX was discontinued. May need to resume MiraLAX as an outpatient. The patient and her daughter-in-law very concerned about lows bowel movement and chance of infection. Ordered a stool C. difficile, enteric precaution. Continue to monitor electrolytes.  #Metastatic colon cancer and pain management: Currently on Dilaudid for the pain management. Looks comfortable. Palliative care consult appreciated. Recommended outpatient follow-up with oncologist.  #History of hypertension: Monitor blood pressure. On amlodipine, Cardizem, losartan  #Hypokalemia and hypomagnesemia: Repleted with magnesium and potassium chloride. Repeat lab in the morning. Encourage oral intake.  #History of paroxysmal A. Fib: Not on anticoagulation. Monitor heart rate. Continue diltiazem  DVT prophylaxis:SCD Code Status: DO NOT RESUSCITATE Family Communication: Discussed with the patient and her daughter-in-law at bedside Disposition Plan: Likely discharge in 1-2 days. PT OT ordered. Case manager consult.    Consultants:  General surgery.   Gastroenterology.   Oncology  Procedures:Flexible  sigmoidoscopy with stent placement 12/31  Antimicrobials:none  Subjective:  Seen and examined at bedside. Reported loose bowel movement and some abdominal cramping. Denied nausea vomiting. Denies dysuria urgency or frequency. Patient's daughter-in-law wanted to check UA to rule out UTI.  Objective: Vitals:   08/13/17 1435 08/13/17 2139 08/14/17 0551 08/14/17 1431  BP: (!) 148/77 (!) 147/84 (!) 137/91 126/70  Pulse: 98 (!) 111 (!) 103 87  Resp: _0 Temp: 98.1 F (36.7 C) 99.8 F (37.7 C) 98.4 F (36.9 C) 98.3 F (36.8 C)  TempSrc: Oral Oral Oral Oral  SpO2: 97% 98% 97% 97%  Weight:      Height:        Intake/Output Summary (Last 24 hours) at 08/14/2017 1548 Last data filed at 08/14/2017 1503 Gross per 24 hour  Intake 1492.5 ml  Output -  Net 1492.5 ml   Filed Weights   08/07/17 1449  Weight: 81.2 kg (179 lb)    Examination:  General exam: Appears calm and comfortable  Respiratory system: Clear to auscultation. Respiratory effort normal. No wheezing or crackle Cardiovascular system: S1 & S2 heard, RRR.  No pedal edema. Gastrointestinal system: Abdomen is nondistended, soft and nontender. Normal bowel sounds heard. Central nervous system: Alert and oriented. No focal neurological deficits. Skin: No rashes, lesions or ulcers Psychiatry: Judgement and insight appear normal. Mood & affect appropriate.     Data Reviewed: I have personally reviewed following labs and imaging studies  CBC: Recent Labs  Lab 08/08/17 0542 08/13/17 0638 08/14/17 0542  WBC 19.6* 9.9 10.9*  HGB 12.5 12.1 12.0  HCT 39.1 36.5 36.0  MCV 95.1 92.4 91.4  PLT 332 315 124   Basic Metabolic Panel: Recent Labs  Lab 08/10/17 0547 08/11/17 0614 08/12/17 0618 08/13/17 0638 08/14/17 0542  NA 138 137 141 141  140  K 3.5 3.1* 3.3* 3.1* 3.0*  CL 103 104 108 109 110  CO2 _0 GLUCOSE 108* 118* 92 95 94  BUN 22* 14 9 5* <5*  CREATININE 0.64 0.53 0.56 0.68 0.67  CALCIUM 8.5*  8.6* 8.4* 8.4* 8.1*  MG  --   --  1.5* 1.7 1.6*   GFR: Estimated Creatinine Clearance: 58.3 mL/min (by C-G formula based on SCr of 0.67 mg/dL). Liver Function Tests: Recent Labs  Lab 08/08/17 0542 08/14/17 0542  AST 149* 25  ALT 91* 38  ALKPHOS 96 81  BILITOT 1.5* 0.9  PROT 6.3* 5.1*  ALBUMIN 3.2* 2.5*   No results for input(s): LIPASE, AMYLASE in the last 168 hours. No results for input(s): AMMONIA in the last 168 hours. Coagulation Profile: No results for input(s): INR, PROTIME in the last 168 hours. Cardiac Enzymes: No results for input(s): CKTOTAL, CKMB, CKMBINDEX, TROPONINI in the last 168 hours. BNP (last 3 results) No results for input(s): PROBNP in the last 8760 hours. HbA1C: No results for input(s): HGBA1C in the last 72 hours. CBG: No results for input(s): GLUCAP in the last 168 hours. Lipid Profile: No results for input(s): CHOL, HDL, LDLCALC, TRIG, CHOLHDL, LDLDIRECT in the last 72 hours. Thyroid Function Tests: No results for input(s): TSH, T4TOTAL, FREET4, T3FREE, THYROIDAB in the last 72 hours. Anemia Panel: No results for input(s): VITAMINB12, FOLATE, FERRITIN, TIBC, IRON, RETICCTPCT in the last 72 hours. Sepsis Labs: No results for input(s): PROCALCITON, LATICACIDVEN in the last 168 hours.  Recent Results (from the past 240 hour(s))  C difficile quick scan w PCR reflex     Status: None   Collection Time: 08/14/17 12:12 PM  Result Value Ref Range Status   C Diff antigen NEGATIVE NEGATIVE Final   C Diff toxin NEGATIVE NEGATIVE Final   C Diff interpretation No C. difficile detected.  Final         Radiology Studies: Dg Abd 1 View - Kub  Result Date: 08/12/2017 CLINICAL DATA:  Colon stricture.  Tenderness of the abdomen. EXAM: ABDOMEN - 1 VIEW COMPARISON:  08/07/2017.  Fluoroscopy 08/11/2017 FINDINGS: Colonic wall stent is demonstrated in the pelvis. Scattered gas-filled bowel loops in the abdomen without abnormal distention. Stool throughout the  colon. Calcification in the right upper quadrant may represent calcified gallstone or gallbladder wall calcification. No radiopaque stones. Degenerative changes in the spine and hips. IMPRESSION: Colonic wall stent demonstrated in the pelvis. Nonobstructive bowel gas pattern. Electronically Signed   By: Lucienne Capers M.D.   On: 08/12/2017 20:36        Scheduled Meds: . amLODipine  5 mg Oral Daily  . diltiazem  240 mg Oral Daily  . latanoprost  1 drop Both Eyes QHS  . loratadine  10 mg Oral Daily  . losartan  100 mg Oral Daily  . potassium chloride  40 mEq Oral BID   Continuous Infusions: . lactated ringers 50 mL/hr at 08/14/17 1406     LOS: 7 days    Dron Tanna Furry, MD [facility] Pager [phone]  If 7PM-7AM, please contact night-coverage [email or web address] Password [facility] 08/14/2017, 3:48 PM

## 2017-08-14 NOTE — Care Management Important Message (Signed)
Important Message  Patient Details  Name: Mary [name] MRN: [id number] Date of Birth: 02/07/1933   Medicare Important Message Given:  Yes    [name] 08/14/2017, 10:52 AMImportant Message  Patient Details  Name: Mary [name] MRN: [id number] Date of Birth: 02/07/1933   Medicare Important Message Given:  Yes    [name] 08/14/2017, 10:52 AM

## 2017-08-14 NOTE — Progress Notes (Signed)
Mary [name]   DOB:02/07/1933   ZG#:[id number]   PRF#:[id number]  ONCOLOGY F/U NOTE   Subjective: Patient underwent sigmoid colon stent placement on August 11, 2017, she tolerated procedure very well.  She has been having diarrhea since the procedure, which has improved today.  She is on soft diet now, tolerating well.  Her pain overall has much improved, she is on Dilaudid prn.   Objective:  Vitals:   08/14/17 0551 08/14/17 1431  BP: (!) 137/91 126/70  Pulse: (!) 103 87  Resp: 19 20  Temp: 98.4 F (36.9 C) 98.3 F (36.8 C)  SpO2: 97% 97%    Body mass index is 28.89 kg/m.  Intake/Output Summary (Last 24 hours) at 08/14/2017 1959 Last data filed at 08/14/2017 1717 Gross per 24 hour  Intake 1732.5 ml  Output -  Net 1732.5 ml     Sclerae unicteric  Oropharynx clear  No peripheral adenopathy  Lungs clear -- no rales or rhonchi  Heart regular rate and rhythm  Abdomen soft, mild diffuse tenderness   MSK no focal spinal tenderness, no peripheral edema  Neuro nonfocal   CBG (last 3)  No results for input(s): GLUCAP in the last 72 hours.   Labs:  Lab Results  Component Value Date   WBC 10.9 (H) 08/14/2017   HGB 12.0 08/14/2017   HCT 36.0 08/14/2017   MCV 91.4 08/14/2017   PLT 317 08/14/2017   NEUTROABS 15.7 (H) 08/07/2017   CMP Latest Ref Rng & Units 08/14/2017 08/13/2017 08/12/2017  Glucose 65 - 99 mg/dL 94 95 92  BUN 6 - 20 mg/dL <5(L) 5(L) 9  Creatinine 0.44 - 1.00 mg/dL 0.67 0.68 0.56  Sodium 135 - 145 mmol/L 140 141 141  Potassium 3.5 - 5.1 mmol/L 3.0(L) 3.1(L) 3.3(L)  Chloride 101 - 111 mmol/L 110 109 108  CO2 22 - 32 mmol/L _0 Calcium 8.9 - 10.3 mg/dL 8.1(L) 8.4(L) 8.4(L)  Total Protein 6.5 - 8.1 g/dL 5.1(L) - -  Total Bilirubin 0.3 - 1.2 mg/dL 0.9 - -  Alkaline Phos 38 - 126 U/L 81 - -  AST 15 - 41 U/L 25 - -  ALT 14 - 54 U/L 38 - -    Urine Studies No results for input(s): UHGB, CRYS in the last 72 hours.  Invalid input(s): UACOL, UAPR, USPG, UPH,  UTP, UGL, [place], UBIL, UNIT, [place], [place]  Basic Metabolic Panel: Recent Labs  Lab 08/10/17 0547 08/11/17 9357 08/12/17 0618 08/13/17 0638 08/14/17 0542  NA 138 137 141 141 140  K 3.5 3.1* 3.3* 3.1* 3.0*  CL 103 104 108 109 110  CO2 _1 GLUCOSE 108* 118* 92 95 94  BUN 22* 14 9 5* <5*  CREATININE 0.64 0.53 0.56 0.68 0.67  CALCIUM 8.5* 8.6* 8.4* 8.4* 8.1*  MG  --   --  1.5* 1.7 1.6*   GFR Estimated Creatinine Clearance: 58.3 mL/min (by C-G formula based on SCr of 0.67 mg/dL). Liver Function Tests: Recent Labs  Lab 08/08/17 0542 08/14/17 0542  AST 149* 25  ALT 91* 38  ALKPHOS 96 81  BILITOT 1.5* 0.9  PROT 6.3* 5.1*  ALBUMIN 3.2* 2.5*   No results for input(s): LIPASE, AMYLASE in the last 168 hours. No results for input(s): AMMONIA in the last 168 hours. Coagulation profile No results for input(s): INR, PROTIME in the last 168 hours.  CBC: Recent Labs  Lab 08/08/17 0542  08/13/17 0638 08/14/17 0542  WBC 19.6* 9.9 10.9*  HGB 12.5 12.1 12.0  HCT 39.1 36.5 36.0  MCV 95.1 92.4 91.4  PLT 332 315 317   Cardiac Enzymes: No results for input(s): CKTOTAL, CKMB, CKMBINDEX, TROPONINI in the last 168 hours. BNP: Invalid input(s): POCBNP CBG: No results for input(s): GLUCAP in the last 168 hours. D-Dimer No results for input(s): DDIMER in the last 72 hours. Hgb A1c No results for input(s): HGBA1C in the last 72 hours. Lipid Profile No results for input(s): CHOL, HDL, LDLCALC, TRIG, CHOLHDL, LDLDIRECT in the last 72 hours. Thyroid function studies No results for input(s): TSH, T4TOTAL, T3FREE, THYROIDAB in the last 72 hours.  Invalid input(s): FREET3 Anemia work up No results for input(s): VITAMINB12, FOLATE, FERRITIN, TIBC, IRON, RETICCTPCT in the last 72 hours. Microbiology Recent Results (from the past 240 hour(s))  C difficile quick scan w PCR reflex     Status: None   Collection Time: 08/14/17 12:12 PM   Result Value Ref Range Status   C Diff antigen NEGATIVE NEGATIVE Final   C Diff toxin NEGATIVE NEGATIVE Final   C Diff interpretation No C. difficile detected.  Final      Studies:  Dg Abd 1 View - Kub  Result Date: 08/12/2017 CLINICAL DATA:  Colon stricture.  Tenderness of the abdomen. EXAM: ABDOMEN - 1 VIEW COMPARISON:  08/07/2017.  Fluoroscopy 08/11/2017 FINDINGS: Colonic wall stent is demonstrated in the pelvis. Scattered gas-filled bowel loops in the abdomen without abnormal distention. Stool throughout the colon. Calcification in the right upper quadrant may represent calcified gallstone or gallbladder wall calcification. No radiopaque stones. Degenerative changes in the spine and hips. IMPRESSION: Colonic wall stent demonstrated in the pelvis. Nonobstructive bowel gas pattern. Electronically Signed   By: Lucienne Capers M.D.   On: 08/12/2017 20:36    Assessment: 84 y.o. with metastatic colon cancer to liver and lungs, currently on palliative chemotherapy Xeloda and Panitumumab, presented with bowel obstruction   1.  Colonic obstruction, secondary to primary sigmoid colon tumor, s/p stent placement  2.  Metastatic colon cancer  to liver and lungs, KRAS/NRAS wild type  3. HTN 4. AF 5. Diarrhea     Plan:  -her bowel obstruction has resolved now, she has had frequent bowel movement since the procedure, improved today.  Started liquid diet, advance to soft diet today, tolerating well. -Where her overall condition stabilized, she is likely going home. -She overall feels much better, she is open to continue chemotherapy, since she previously tolerated well and had a good response. -I will wait and see to see how well she recovers, I plan to see her back in my clinic in a few weeks. -We also discussed palliative care alone if she is not able to recover well, which is also reasonable given her advanced age and incurable nature of her metastatic colon cancer. -I answered all her  questions.  -she was only on tramadol previously, now on Dilaudid, I suggest her to try oxycodone will low-dose morphine  in the hospital, and decide which narcotics she will go home with. Dr. Domingo Cocking has been kindly follow her and adjust her pain meds  -I will see her as needed before discharge, please call me if anything I can do for her.    Truitt Merle, MD 08/14/2017  7:59 PM

## 2017-08-14 NOTE — Progress Notes (Signed)
PMT progress note Reason for visit: goals of care in light of metastatic disease, pain and symptom management  Patient is resting comfortably in bed.  Daughter-in-law is present in the room.  She reports that her pain is currently well controlled.  Has had only one dose of pain medication today.  Discussed plan for transition to oral Dilaudid when it is time for discharge.  Her only concern today remains "getting some rest."  Reports trying trazodone last night, however, she has had multiple bowel movements every hour or 2 and was therefore unable to get any rest.  We talked again about long-term goals.  Prior to having stent placed, she had stated that she is not interested in any further chemotherapy.  She is now feeling better, and she reports today that this is something that she will need to discuss with Dr. Burr Medico prior to making any decisions about next steps in care plan.  BP (!) 137/91 (BP Location: Right Arm)   Pulse (!) 103   Temp 98.4 F (36.9 C) (Oral)   Resp 19   Ht 5\' 6"  (1.676 m)   Wt 81.2 kg (179 lb)   SpO2 97%   BMI 28.89 kg/m  Labs and imaging noted  Awake alert Hard of hearing No distress Regular S1 S2 Mild gen abd tenderness No edema  Colon obstruction Cancer of colon A fib HTN  Goals of care discussions ongoing.  She needs to have follow-up with Dr. Burr Medico prior to making any other decisions about long-term goals of care.  S/P colonic stenting on 12-31.  Reports feeling much improved overall.  Continue as needed dilaudid for pain.  One dose in the last 24 hours.  On discharge, I would recommend transition to Dilaudid 2 mg p.o. every 3 hours as needed.  Trial of trazodone for insomnia.  Discussed with bedside RN, patient, and daughter-in-law.  Total time: 30 minutes Greater than 50%  of this time was spent counseling and coordinating care related to the above assessment and plan.  Micheline Rough, MD [place]  Team [phone]

## 2017-08-14 NOTE — Progress Notes (Signed)
[facility] PROGRESS NOTE Subjective Patient still having diarrhea but some of the stools had some form to them.  C. difficile was obtained but is still pending at this time.  No abdominal pain, tolerated diet  Objective: Vital signs in last 24 hours: Temp:  [98.1 F (36.7 C)-99.8 F (37.7 C)] 98.4 F (36.9 C) (01/03 0551) Pulse Rate:  [98-111] 103 (01/03 0551) Resp:  [19-20] 19 (01/03 0551) BP: (137-148)/(77-91) 137/91 (01/03 0551) SpO2:  [97 %-98 %] 97 % (01/03 0551) Last BM Date: 08/14/17  Intake/Output from previous day: 01/02 0701 - 01/03 0700 In: 801.7 [P.O.:360; I.V.:441.7] Out: -  Intake/Output this shift: Total I/O In: 240 [P.O.:240] Out: -   PE: General--alert no distress - Abdomen--nontender, minimal distention  Lab Results: Recent Labs    08/13/17 0638 08/14/17 0542  WBC 9.9 10.9*  HGB 12.1 12.0  HCT 36.5 36.0  PLT 315 317   BMET Recent Labs    08/12/17 0618 08/13/17 0638 08/14/17 0542  NA 141 141 140  K 3.3* 3.1* 3.0*  CL 108 109 110  CO2 27 28 24   CREATININE 0.56 0.68 0.67   LFT Recent Labs    08/14/17 0542  PROT 5.1*  AST 25  ALT 38  ALKPHOS 81  BILITOT 0.9   PT/INR No results for input(s): LABPROT, INR in the last 72 hours. PANCREAS No results for input(s): LIPASE in the last 72 hours.       Studies/Results: Dg Abd 1 View - Kub  Result Date: 08/12/2017 CLINICAL DATA:  Colon stricture.  Tenderness of the abdomen. EXAM: ABDOMEN - 1 VIEW COMPARISON:  08/07/2017.  Fluoroscopy 08/11/2017 FINDINGS: Colonic wall stent is demonstrated in the pelvis. Scattered gas-filled bowel loops in the abdomen without abnormal distention. Stool throughout the colon. Calcification in the right upper quadrant may represent calcified gallstone or gallbladder wall calcification. No radiopaque stones. Degenerative changes in the spine and hips. IMPRESSION: Colonic wall stent demonstrated in the pelvis. Nonobstructive bowel gas pattern.  Electronically Signed   By: Lucienne Capers M.D.   On: 08/12/2017 20:36    Medications: I have reviewed the patient's current medications.  Assessment:   1.  Colon obstruction secondary to cancer.  Patient doing reasonably well following placement of the stent.  Long discussion with patient and daughter-in-law about the fact that she had a large quantity of solid stool above the stent and that in spite of placement of the stent the opening is still small and it will take some time for all of this to come through.  Discussed in detail the need to avoid high residue foods and they were told that she may need to go back on MiraLAX at some point in the future to make certain that the stent is not occluded.   Plan:  We will sign off for now but please call us if we can be of further assistance.   Nancy Fetter 08/14/2017, 11:53 AM  This note was created using voice recognition software. Minor errors may Have occurred unintentionally.  Pager: [phone] If no answer or after hours call [phone]

## 2017-08-15 DIAGNOSIS — K56699 Other intestinal obstruction unspecified as to partial versus complete obstruction: Secondary | ICD-10-CM

## 2017-08-15 LAB — BASIC METABOLIC PANEL
Anion gap: 5 (ref 5–15)
BUN: 6 mg/dL (ref 6–20)
CHLORIDE: 111 mmol/L (ref 101–111)
CO2: 23 mmol/L (ref 22–32)
Calcium: 8.4 mg/dL — ABNORMAL LOW (ref 8.9–10.3)
Creatinine, Ser: 0.72 mg/dL (ref 0.44–1.00)
GFR calc Af Amer: >60 mL/min (ref >60)
GFR calc non Af Amer: >60 mL/min (ref >60)
GLUCOSE: 105 mg/dL — AB (ref 65–99)
POTASSIUM: 3.9 mmol/L (ref 3.5–5.1)
Sodium: 139 mmol/L (ref 135–145)

## 2017-08-15 LAB — MAGNESIUM: Magnesium: 1.8 mg/dL (ref 1.7–2.4)

## 2017-08-15 MED ORDER — LOPERAMIDE HCL 2 MG PO CAPS
2.0000 mg | ORAL_CAPSULE | Freq: Once | ORAL | Status: AC
  Administered 2017-08-15: 2 mg via ORAL
  Filled 2017-08-15: qty 1

## 2017-08-15 MED ORDER — OXYCODONE-ACETAMINOPHEN 7.5-325 MG PO TABS: 1.0000 | ORAL_TABLET | Freq: Three times a day (TID) | ORAL | 0 refills | Status: DC | PRN

## 2017-08-15 MED ORDER — POTASSIUM CHLORIDE ER 20 MEQ PO TBCR: 20.0000 meq | EXTENDED_RELEASE_TABLET | Freq: Every day | ORAL | 0 refills | Status: DC

## 2017-08-15 NOTE — Progress Notes (Signed)
Patient is from [place] and needs a bed in [place].   LCSW faxed patient out to facilities in [place]. Patient and family prefers [facility] or [facility] both in [place].     Carolin Coy [place]

## 2017-08-15 NOTE — Clinical Social Work Note (Signed)
Clinical Social Work Assessment  Patient Details  Name: Mary [name] MRN: [id number] Date of Birth: 02/07/1933  Date of referral:  08/15/17               Reason for consult:  Facility Placement, Discharge Planning                Permission sought to share information with:  Facility Sport and exercise psychologist, Tourist information centre manager, Family Supports Permission granted to share information::  Yes, Verbal Permission Granted  Name::        Agency::  [facility] [place]  Relationship::  Son and DIL  Contact Information:     Housing/Transportation Living arrangements for the past 2 months:  [place] of Information:  Patient, Adult Children Patient Interpreter Needed:  None Criminal Activity/Legal Involvement Pertinent to Current Situation/Hospitalization:  No - Comment as needed Significant Relationships:  Adult Children Lives with:  Self Do you feel safe going back to the place where you live?  Yes Need for family participation in patient care:  Yes (Comment)  Care giving concerns:  No care giving concerns at the time of assessment.    Social Worker assessment / plan:  LCSW following for SNF placement.  Patient has history of  afib; macular degeneration; HTN; HLD; GERD; AAA; and colon cancer presenting with abdominal pain.  Patient was admitted for abdominal pain.   LCSW met bedside with patient. Patient's daughter in law was present.   Patient reports that she is independent in her ADLs prior to hospitalization.   Patient reports that she lives alone and her don and DIL live about 30 mins away and they are her only support system.   Patient feels uncomfortable going home alone with her children far away. Patient would like to go to SNF in [place] close to her home and family.   PLAN: Patient will go to SNF at DC.   Employment status:  Retired Forensic scientist:  Medicare PT Recommendations:  [place] / Referral to community  resources:  [place]  Patient/Family's Response to care:  Family is supportive and appreciate of patients care in hospital. Family and patient are thankful for LCSW visit and services.   Patient/Family's Understanding of and Emotional Response to Diagnosis, Current Treatment, and Prognosis:  Patient and family are understanding of patients diagnosis and current treatment plan.   Emotional Assessment Appearance:  Appears younger than stated age Attitude/Demeanor/Rapport:    Affect (typically observed):  Calm, Pleasant Orientation:  Oriented to Self, Oriented to Place, Oriented to  Time, Oriented to Situation Alcohol / Substance use:  Not Applicable Psych involvement (Current and /or in the community):  No (Comment)  Discharge Needs  Concerns to be addressed:  No discharge needs identified Readmission within the last 30 days:  No Current discharge risk:  None Barriers to Discharge:  No SNF bed   Servando Snare, LCSW 08/15/2017, 1:52 PM

## 2017-08-15 NOTE — NC FL2 (Signed)
[place] MEDICAID FL2 LEVEL OF CARE SCREENING TOOL     IDENTIFICATION  Patient Name: Mary [name] Birthdate: 02/07/1933 Sex: female Admission Date (Current Location): 08/07/2017  [facility] and [place] Number:  Herbalist and Address:  [facility],  [place] [address] [place]      Provider Number: [id number]  Attending Physician Name and Address:  Rosita Fire, MD  Relative Name and Phone Number:       Current Level of Care: Hospital Recommended Level of Care: [place] Prior Approval Number:    Date Approved/Denied:   PASRR Number:    Discharge Plan: SNF    Current Diagnoses: Patient Active Problem List   Diagnosis Date Noted  . Colon stricture ([place])   . Diarrhea   . Hyperglycemia 08/08/2017  . Rectosigmoid stricture from obstructing cancer s/p endoscopic stenting 08/11/2017 08/07/2017  . Bowel obstruction ([place]) 08/07/2017  . Goals of care, counseling/discussion 12/05/2016  . Aneurysm of ascending aorta ([facility]) 12/03/2016  . Persistent atrial fibrillation ([place]) 10/31/2016  . Essential hypertension 10/31/2016  . Hyperlipidemia 10/31/2016  . Cancer of left colon ([place]) 10/25/2016    Orientation RESPIRATION BLADDER Height & Weight     Self, Time, Situation, Place  Normal Continent Weight: 179 lb (81.2 kg) Height:  5\' 6"  (167.6 cm)  BEHAVIORAL SYMPTOMS/MOOD NEUROLOGICAL BOWEL NUTRITION STATUS      Continent Diet(soft diet)  AMBULATORY STATUS COMMUNICATION OF NEEDS Skin     Verbally Normal                       Personal Care Assistance Level of Assistance              Functional Limitations Info  Sight, Hearing, Speech Sight Info: Impaired(Legally blind in right eye) Hearing Info: Impaired(hard of hearing) Speech Info: Adequate    SPECIAL CARE FACTORS FREQUENCY  PT (By licensed PT), OT (By licensed OT)     PT Frequency: 5x OT Frequency: 5x            Contractures Contractures Info:  Not present    Additional Factors Info  Code Status, Allergies Code Status Info: DNR Allergies Info: Benazepril, Bactrim Sulfamethoxazole-trimethoprim           Current Medications (08/15/2017):  This is the current hospital active medication list Current Facility-Administered Medications  Medication Dose Route Frequency Provider Last Rate Last Dose  . acetaminophen (TYLENOL) tablet 650 mg  650 mg Oral Q6H PRN Karmen Bongo, MD       Or  . acetaminophen (TYLENOL) suppository 650 mg  650 mg Rectal Q6H PRN Karmen Bongo, MD      . amLODipine (NORVASC) tablet 5 mg  5 mg Oral Daily Danford, Suann Larry, MD   5 mg at 08/15/17 1000  . diltiazem (CARDIZEM CD) 24 hr capsule 240 mg  240 mg Oral Daily Karmen Bongo, MD   240 mg at 08/15/17 1000  . HYDROmorphone (DILAUDID) injection 0.5 mg  0.5 mg Intravenous Q4H PRN Micheline Rough, MD   0.5 mg at 08/14/17 0933  . lactated ringers infusion   Intravenous Continuous Bonnielee Haff, MD 50 mL/hr at 08/14/17 1406    . latanoprost (XALATAN) 0.005 % ophthalmic solution 1 drop  1 drop Both Eyes QHS Karmen Bongo, MD   1 drop at 08/14/17 2112  . loratadine (CLARITIN) tablet 10 mg  10 mg Oral Daily Bonnielee Haff, MD   10 mg at 08/15/17 1000  .  losartan (COZAAR) tablet 100 mg  100 mg Oral Daily Bonnielee Haff, MD   100 mg at 08/15/17 1000  . metoprolol tartrate (LOPRESSOR) injection 5 mg  5 mg Intravenous Q6H PRN Karmen Bongo, MD      . ondansetron [facility]) injection 4 mg  4 mg Intravenous Q6H PRN Karmen Bongo, MD   4 mg at 08/07/17 2033  . polyvinyl alcohol (LIQUIFILM TEARS) 1.4 % ophthalmic solution 1 drop  1 drop Both Eyes PRN Karmen Bongo, MD      . traZODone (DESYREL) tablet 50 mg  50 mg Oral QHS PRN Micheline Rough, MD   50 mg at 08/14/17 2111     Discharge Medications: Please see discharge summary for a list of discharge medications.  Relevant Imaging Results:  Relevant Lab Results:   Additional Information SS#  [id number]  Nila Nephew, LCSW

## 2017-08-15 NOTE — Evaluation (Signed)
Occupational Therapy Evaluation Patient Details Name: Mary [name] MRN: [id number] DOB: 02/07/1933 Today's Date: 08/15/2017    History of Present Illness 84 year old female with atrial fibrillation not on anticoagulation, diagnosed in January of this year with colonic mass, metastatic colon cancer, later started on palliative chemotherapy and typically independent at baseline. CT: colonic stricture and obstruction.  Pt s/p Flexible sigmoidoscopy with stent placement 12/31   Clinical Impression   Pt admitted with colonic mass and obstruction. Pt currently with functional limitations due to the deficits listed below (see OT Problem List). Pt will benefit from skilled OT to increase their safety and independence with ADL and functional mobility for ADL to facilitate discharge to venue listed below.      Follow Up Recommendations  SNF    Equipment Recommendations  None recommended by OT    Recommendations for Other Services       Precautions / Restrictions Precautions Precautions: Fall      Mobility Bed Mobility Overal bed mobility: Modified Independent                Transfers Overall transfer level: Needs assistance Equipment used: None Transfers: Sit to/from Stand Sit to Stand: Min assist         General transfer comment: steady assist to stabilize upon standing.    Balance Overall balance assessment: Needs assistance Sitting-balance support: Feet supported;No upper extremity supported Sitting balance-Leahy Scale: Normal     Standing balance support: No upper extremity supported;During functional activity Standing balance-Leahy Scale: Poor Standing balance comment: at least good static standing balance observed bur dynamic balance is poor, especially if multityasking.                           ADL either performed or assessed with clinical judgement   ADL Overall ADL's : Needs assistance/impaired Eating/Feeding: Set up;Sitting   Grooming:  Set up;Sitting   Upper Body Bathing: Set up;Sitting   Lower Body Bathing: Minimal assistance;Sit to/from stand;Cueing for sequencing;Cueing for safety   Upper Body Dressing : Set up;Sitting   Lower Body Dressing: Minimal assistance;Sit to/from stand;Cueing for sequencing;Cueing for safety   Toilet Transfer: Minimal assistance;Comfort height toilet   Toileting- Clothing Manipulation and Hygiene: Minimal assistance         General ADL Comments: pt fatigues quicklly. Pt has limited A at home. Pt would benefit from [facility] SNF to increase I with ADL activity prior to DC home                  Pertinent Vitals/Pain Pain Assessment: No/denies pain        Extremity/Trunk Assessment Upper Extremity Assessment Upper Extremity Assessment: Generalized weakness   Lower Extremity Assessment Lower Extremity Assessment: Generalized weakness   Cervical / Trunk Assessment Cervical / Trunk Assessment: Normal   Communication Communication Communication: HOH   Cognition Arousal/Alertness: Awake/alert Behavior During Therapy: WFL for tasks assessed/performed Overall Cognitive Status: Within Functional Limits for tasks assessed                                                Home Living Family/patient expects to be discharged to:: Private residence Living Arrangements: Alone Available Help at Discharge: Family;Available PRN/intermittently Type of Home: House Home Access: Ramped entrance     Home Layout: One level  Home Equipment: Wheelchair - manual   Additional Comments: pt has accessible home due to hx of her spouse (now deceased) having CVA and w/c bound      Prior Functioning/Environment Level of Independence: Independent        Comments: is blind in right eye, macular degen        OT Problem List: Decreased strength;Decreased activity tolerance;Impaired balance (sitting and/or standing);Decreased safety awareness;Decreased  knowledge of use of DME or AE      OT Treatment/Interventions: Self-care/ADL training;Therapeutic activities;Patient/family education    OT Goals(Current goals can be found in the care plan section) Acute Rehab OT Goals Patient Stated Goal: to go home OT Goal Formulation: With patient Time For Goal Achievement: 08/29/17 Potential to Achieve Goals: Good  OT Frequency: Min 2X/week   Barriers to D/C: Decreased caregiver support             AM-PAC PT "6 Clicks" Daily Activity     Outcome Measure Help from another person eating meals?: None Help from another person taking care of personal grooming?: A Little Help from another person toileting, which includes using toliet, bedpan, or urinal?: A Little Help from another person bathing (including washing, rinsing, drying)?: A Little Help from another person to put on and taking off regular upper body clothing?: A Little Help from another person to put on and taking off regular lower body clothing?: A Little 6 Click Score: 19   End of Session Equipment Utilized During Treatment: Gait belt Nurse Communication: Mobility status  Activity Tolerance: Patient tolerated treatment well Patient left: in chair  OT Visit Diagnosis: Unsteadiness on feet (R26.81);Muscle weakness (generalized) (M62.81)                Time: 1150-1210 OT Time Calculation (min): 20 min Charges:  OT General Charges $OT Visit: 1 Visit OT Evaluation $OT Eval Moderate Complexity: 1 Mod G-Codes:     Kari Baars, [place] [phone]  Payton Mccallum D 08/15/2017, 12:43 PM

## 2017-08-15 NOTE — Evaluation (Signed)
Physical Therapy Evaluation Patient Details Name: Mary [name] MRN: [id number] DOB: 02/07/1933 Today's Date: 08/15/2017   History of Present Illness  84 year old female with atrial fibrillation not on anticoagulation, diagnosed in January of this year with colonic mass, metastatic colon cancer, later started on palliative chemotherapy and typically independent at baseline. CT: colonic stricture and obstruction.  Pt s/p Flexible sigmoidoscopy with stent placement 12/31  Clinical Impression  The patient was reevaluated for safety for DC. The patient does demonstrate decreased balance while ambulating without UE support(patient was not tested without last visit). The patient has limited assistance at home. The patient has had limited ambulation during the weeks stay in hospital.  Recommend a short stay for rehab to regain independence and safety.  Pt admitted with above diagnosis. Pt currently with functional limitations due to the deficits listed below (see PT Problem List).  Pt will benefit from skilled PT to increase their independence and safety with mobility to allow discharge to the venue listed below.     Follow Up Recommendations SNF    Equipment Recommendations  3in1 (PT);Rolling walker with 5" wheels    Recommendations for Other Services       Precautions / Restrictions Precautions Precautions: Fall      Mobility  Bed Mobility Overal bed mobility: Modified Independent                Transfers Overall transfer level: Needs assistance Equipment used: None Transfers: Sit to/from Stand Sit to Stand: Min guard;Min assist         General transfer comment: steady assist to stabilize upon standing.  Ambulation/Gait Ambulation/Gait assistance: Min assist Ambulation Distance (Feet): 100 Feet Assistive device: 1 person hand held assist Gait Pattern/deviations: Step-through pattern;Decreased stride length;Narrow base of support;Drifts right/left;Staggering  right;Staggering left     General Gait Details: patient did not use AD and noted to be unbalanced, stopped x 4 to regain balance. Noted more loss of balance when turning head and talking.   Stairs            Wheelchair Mobility    Modified Rankin (Stroke Patients Only)       Balance Overall balance assessment: Needs assistance Sitting-balance support: Feet supported;No upper extremity supported Sitting balance-Leahy Scale: Normal     Standing balance support: No upper extremity supported;During functional activity Standing balance-Leahy Scale: Poor Standing balance comment: at least good static standing balance observed bur dynamic balance is poor, especially if multityasking.                             Pertinent Vitals/Pain Pain Assessment: No/denies pain    Home Living Family/patient expects to be discharged to:: Private residence Living Arrangements: Alone Available Help at Discharge: Family;Available PRN/intermittently Type of Home: House Home Access: Ramped entrance     Home Layout: One level Home Equipment: Wheelchair - manual Additional Comments: pt has accessible home due to hx of her spouse (now deceased) having CVA and w/c bound, the family is not available and  friend is not availble to assist.    Prior Function Level of Independence: Independent         Comments: is blind in right eye, macular degen     Hand Dominance        Extremity/Trunk Assessment        Lower Extremity Assessment Lower Extremity Assessment: Generalized weakness    Cervical / Trunk Assessment Cervical / Trunk Assessment: Normal  Communication  Communication: HOH  Cognition Arousal/Alertness: Awake/alert Behavior During Therapy: WFL for tasks assessed/performed Overall Cognitive Status: Within Functional Limits for tasks assessed                                        General Comments      Exercises     Assessment/Plan     PT Assessment Patient needs continued PT services  PT Problem List Decreased strength;Decreased activity tolerance;Decreased balance;Decreased mobility;Decreased knowledge of use of DME;Decreased safety awareness;Decreased knowledge of precautions       PT Treatment Interventions DME instruction;Gait training;Functional mobility training;Therapeutic activities;Therapeutic exercise;Patient/family education    PT Goals (Current goals can be found in the Care Plan section)  Acute Rehab PT Goals Patient Stated Goal: to go home PT Goal Formulation: With patient/family Time For Goal Achievement: 08/22/17 Potential to Achieve Goals: Good    Frequency Min 2X/week   Barriers to discharge Decreased caregiver support patient will be home alone    Co-evaluation               AM-PAC PT "6 Clicks" Daily Activity  Outcome Measure Difficulty turning over in bed (including adjusting bedclothes, sheets and blankets)?: None Difficulty moving from lying on back to sitting on the side of the bed? : None Difficulty sitting down on and standing up from a chair with arms (e.g., wheelchair, bedside commode, etc,.)?: A Little Help needed moving to and from a bed to chair (including a wheelchair)?: A Little Help needed walking in hospital room?: A Little Help needed climbing 3-5 steps with a railing? : A Little 6 Click Score: 20    End of Session Equipment Utilized During Treatment: Gait belt Activity Tolerance: Patient tolerated treatment well Patient left: in chair;with call bell/phone within reach;with chair alarm set;with family/visitor present Nurse Communication: Mobility status(can benefit from SNF) PT Visit Diagnosis: Unsteadiness on feet (R26.81);Difficulty in walking, not elsewhere classified (R26.2)    Time: 0947-0962 PT Time Calculation (min) (ACUTE ONLY): 22 min   Charges:   PT Evaluation $PT Re-evaluation: 1 Re-eval     PT G CodesTresa Endo PT [phone]   Claretha Cooper 08/15/2017, 12:27 PM

## 2017-08-15 NOTE — Discharge Summary (Addendum)
Physician Discharge Summary  Mary [name] ZDG:[id number] DOB: 02/07/1933 DOA: 08/07/2017  PCP: Moshe Cipro, MD  Admit date: 08/07/2017 Discharge date: 08/16/2017  Admitted From:home Disposition:home  Recommendations for Outpatient Follow-up:  1. Follow up with PCP in 1-2 weeks 2. Please obtain BMP/CBC in one week 3. Please follow-up with oncologist in 1-2 weeks.   Home Health:yes Equipment/Devices:walker, [facility] Discharge Condition:stable CODE STATUS:DNR Diet recommendation:heart healthy  Brief/Interim Summary: 84 year old female with atrial fibrillation not on anticoagulation, diagnosed in January of this year with colonic mass, metastatic colon cancer, later started on palliative chemotherapy with Xeloda and an anti-EGFR Ab with Dr. Burr Medico, has excellent functional status at present. Now in last week, became constipated, then vomiting, bloating. On arival to ER, was found on CT to have colonic stricture and obstruction, Gen Surg consulted, placed on PCA. Gastroenterology was subsequently consulted.  # Malignant colonic stricture status post colonic a stent placement on December 31st .   Loose bowel movement is improving but still having frequent bowel movement than normal.  Ordered a dose of Imodium.  Patient and her daughter-in-law was educated regarding importance of a stool softener to maintain patency of the colonic stent.  They verbalized understanding.  Recommended to follow-up with oncologist and PCP.  Tolerating diet well.  Pain is controlled.  Palliative care consult appreciated.  Discharging with oral Percocet.  Reviewed the pain medications with the patient and her daughter-in-law.  #Metastatic colon cancer and pain management: Patient looks comfortable.  Did not like the Dilaudid.  Discharging with oral oxycodone with outpatient follow-up with oncologist.  #History of hypertension: Monitor blood pressure. On amlodipine, Cardizem, losartan  #Hypokalemia and  hypomagnesemia: Improved.  Encourage oral intake.  #History of paroxysmal A. Fib: Not on anticoagulation. Monitor heart rate. Continue diltiazem  Patient is clinically improved.  Recommend to follow-up with PCP and oncology.  She has no UTI and does not have any urinary symptoms.    Patient is medically stable on discharge.  Discharge Diagnoses:  Principal Problem:   Rectosigmoid stricture from obstructing cancer s/p endoscopic stenting 08/11/2017 Active Problems:   Cancer of left colon (HCC)   Persistent atrial fibrillation (HCC)   Essential hypertension   Goals of care, counseling/discussion   Hyperglycemia   Diarrhea   Colon stricture [facility])    Discharge Instructions  Discharge Instructions    Call MD for:  difficulty breathing, headache or visual disturbances   Complete by:  As directed    Call MD for:  extreme fatigue   Complete by:  As directed    Call MD for:  hives   Complete by:  As directed    Call MD for:  persistant dizziness or light-headedness   Complete by:  As directed    Call MD for:  persistant nausea and vomiting   Complete by:  As directed    Call MD for:  severe uncontrolled pain   Complete by:  As directed    Call MD for:  temperature >100.4   Complete by:  As directed    DME Bedside commode   Complete by:  As directed    Patient needs a bedside commode to treat with the following condition:  Weakness   Diet - low sodium heart healthy   Complete by:  As directed    Increase activity slowly   Complete by:  As directed      Allergies as of 08/16/2017      Reactions   Benazepril    COUGH  Bactrim [sulfamethoxazole-trimethoprim] Nausea And Vomiting      Medication List    STOP taking these medications   potassium chloride SA 20 MEQ tablet Commonly known as:  K-DUR,KLOR-CON     TAKE these medications   amLODipine 5 MG tablet Commonly known as:  NORVASC Take 1 tablet (5 mg total) by mouth daily.   capecitabine 500 MG tablet Commonly  known as:  XELODA Take 4 tablets (2,000 mg total) by mouth 2 (two) times daily after a meal. Take for 7 days then off 7 days   clindamycin 1 % gel Commonly known as:  CLINDAGEL Apply topically 2 (two) times daily.   diltiazem 240 MG 24 hr capsule Commonly known as:  CARDIZEM CD Take 1 capsule (240 mg total) by mouth daily.   diphenhydrAMINE 25 MG tablet Commonly known as:  BENADRYL Take 25 mg by mouth daily.   docusate sodium 100 MG capsule Commonly known as:  COLACE Take 1 capsule (100 mg total) by mouth 2 (two) times daily. Take once frequent BM's have subsided   escitalopram 10 MG tablet Commonly known as:  LEXAPRO Take 10 mg by mouth daily.   fexofenadine 180 MG tablet Commonly known as:  ALLEGRA Take 180 mg by mouth daily.   furosemide 40 MG tablet Commonly known as:  LASIX Take 40 mg by mouth daily as needed for edema.   latanoprost 0.005 % ophthalmic solution Commonly known as:  XALATAN Place 1 drop into both eyes at bedtime.   losartan 100 MG tablet Commonly known as:  COZAAR Take 1 tablet (100 mg total) by mouth daily.   metoprolol tartrate 25 MG tablet Commonly known as:  LOPRESSOR 1/2 TABLET BY MOUTH AS NEEDED FOR PALPITATIONS   omeprazole 40 MG capsule Commonly known as:  PRILOSEC Take 40 mg by mouth daily.   ondansetron 4 MG disintegrating tablet Commonly known as:  ZOFRAN ODT Take 1 tablet (4 mg total) by mouth every 8 (eight) hours as needed for nausea or vomiting.   oxyCODONE-acetaminophen 7.5-325 MG tablet Commonly known as:  PERCOCET Take 1 tablet by mouth every 8 (eight) hours as needed for severe pain.   polyethylene glycol packet Commonly known as:  MIRALAX / GLYCOLAX Take 17 g by mouth daily as needed for moderate constipation.   Potassium Chloride ER 20 MEQ Tbcr Take 20 mEq by mouth daily.   SYSTANE 0.4-0.3 % Soln Generic drug:  Polyethyl Glycol-Propyl Glycol Apply 1 drop to eye daily as needed (dry eyes).   traMADol 50 MG  tablet Commonly known as:  ULTRAM Take 1 tablet (50 mg total) by mouth 3 (three) times daily as needed for moderate pain or severe pain. For increased pain.   VITEYES AREDS ADVANCED Caps Take 1 capsule by mouth daily.            Durable Medical Equipment  (From admission, onward)        Start     Ordered   08/15/17 1151  For home use only DME 3 n 1  Once     08/15/17 1151   08/15/17 1119  For home use only DME Walker  [facility])  Once    Question:  Patient needs a walker to treat with the following condition  Answer:  Weakness   08/15/17 1120   08/15/17 0000  DME Bedside commode    Question:  Patient needs a bedside commode to treat with the following condition  Answer:  Weakness   08/15/17 1120  Follow-up [facility] Follow up.   Specialty:  [place] Why:  [facility] nursing,aide Contact information: [address] [place] [address] [phone]        Truitt Merle, MD. Schedule an appointment as soon as possible for a visit in 2 week(s).   Specialties:  Hematology, Oncology Contact information: [place] [address] [phone]        Moshe Cipro, MD. Schedule an appointment as soon as possible for a visit in 1 week(s).   Specialty:  Internal Medicine Contact information: [address] [place] [address] [phone]          Allergies  Allergen Reactions  . Benazepril     COUGH   . Bactrim [Sulfamethoxazole-Trimethoprim] Nausea And Vomiting    Consultations: GI Oncology  Procedures/Studies: Sigmoidoscopy with a stent placement  Subjective: Seen and examined at bedside.  Reported feeling better.  Diarrhea is better but is still having  frequent BMs.  Denied nausea vomiting chest pain or shortness of breath.  Tolerating diet well.  Discharge Exam: Vitals:   08/15/17 2038 08/16/17 0509  BP: 139/60 136/62  Pulse: 88 91  Resp: 18 17  Temp: 98.4 F (36.9 C)  98.2 F (36.8 C)  SpO2: 98% 95%   Vitals:   08/15/17 0653 08/15/17 1414 08/15/17 2038 08/16/17 0509  BP: 137/77 133/69 139/60 136/62  Pulse: (!) 105 90 88 91  Resp: _0 Temp: 98.2 F (36.8 C) 98.5 F (36.9 C) 98.4 F (36.9 C) 98.2 F (36.8 C)  TempSrc: Oral Oral Oral Oral  SpO2: 94% 97% 98% 95%  Weight:      Height:        General: Pt is alert, awake, not in acute distress Cardiovascular: RRR, S1/S2 +, no rubs, no gallops Respiratory: CTA bilaterally, no wheezing, no rhonchi Abdominal: Soft, NT, ND, bowel sounds + Extremities: no edema, no cyanosis    The results of significant diagnostics from this hospitalization (including imaging, microbiology, ancillary and laboratory) are listed below for reference.     Microbiology: Recent Results (from the past 240 hour(s))  C difficile quick scan w PCR reflex     Status: None   Collection Time: 08/14/17 12:12 PM  Result Value Ref Range Status   C Diff antigen NEGATIVE NEGATIVE Final   C Diff toxin NEGATIVE NEGATIVE Final   C Diff interpretation No C. difficile detected.  Final     Labs: BNP (last 3 results) No results for input(s): BNP in the last 8760 hours. Basic Metabolic Panel: Recent Labs  Lab 08/11/17 0614 08/12/17 0618 08/13/17 0638 08/14/17 0542 08/15/17 0551  NA 137 141 141 140 139  K 3.1* 3.3* 3.1* 3.0* 3.9  CL 104 108 109 110 111  CO2 _1 GLUCOSE 118* 92 95 94 105*  BUN 14 9 5* <5* 6  CREATININE 0.53 0.56 0.68 0.67 0.72  CALCIUM 8.6* 8.4* 8.4* 8.1* 8.4*  MG  --  1.5* 1.7 1.6* 1.8   Liver Function Tests: Recent Labs  Lab 08/14/17 0542  AST 25  ALT 38  ALKPHOS 81  BILITOT 0.9  PROT 5.1*  ALBUMIN 2.5*   No results for input(s): LIPASE, AMYLASE in the last 168 hours. No results for input(s): AMMONIA in the last 168 hours. CBC: Recent Labs  Lab 08/13/17 0638 08/14/17 0542  WBC 9.9 10.9*  HGB 12.1 12.0  HCT 36.5 36.0  MCV 92.4 91.4  PLT 315 317   Cardiac  Enzymes: No results for input(s): CKTOTAL, CKMB, CKMBINDEX, TROPONINI in the last 168 hours. BNP: Invalid input(s): POCBNP CBG: No results for input(s): GLUCAP in the last 168 hours. D-Dimer No results for input(s): DDIMER in the last 72 hours. Hgb A1c No results for input(s): HGBA1C in the last 72 hours. Lipid Profile No results for input(s): CHOL, HDL, LDLCALC, TRIG, CHOLHDL, LDLDIRECT in the last 72 hours. Thyroid function studies No results for input(s): TSH, T4TOTAL, T3FREE, THYROIDAB in the last 72 hours.  Invalid input(s): FREET3 Anemia work up No results for input(s): VITAMINB12, FOLATE, FERRITIN, TIBC, IRON, RETICCTPCT in the last 72 hours. Urinalysis    Component Value Date/Time   COLORURINE STRAW (A) 08/14/2017 1218   APPEARANCEUR CLEAR 08/14/2017 1218   LABSPEC 1.004 (L) 08/14/2017 1218   PHURINE 7.0 08/14/2017 1218   GLUCOSEU NEGATIVE 08/14/2017 1218   HGBUR SMALL (A) 08/14/2017 1218   BILIRUBINUR NEGATIVE 08/14/2017 [place] 08/14/2017 1218   PROTEINUR NEGATIVE 08/14/2017 1218   NITRITE NEGATIVE 08/14/2017 1218   LEUKOCYTESUR NEGATIVE 08/14/2017 1218   Sepsis Labs Invalid input(s): PROCALCITONIN,  WBC,  LACTICIDVEN Microbiology Recent Results (from the past 240 hour(s))  C difficile quick scan w PCR reflex     Status: None   Collection Time: 08/14/17 12:12 PM  Result Value Ref Range Status   C Diff antigen NEGATIVE NEGATIVE Final   C Diff toxin NEGATIVE NEGATIVE Final   C Diff interpretation No C. difficile detected.  Final     Time coordinating discharge: 33 minutes  SIGNED:   Rosita Fire, MD  [facility] 08/16/2017, 11:22 AM  If 7PM-7AM, please contact night-coverage [email or web address] Password [email or web address]

## 2017-08-16 NOTE — Progress Notes (Signed)
Mary [name] to be D/C'd Home per MD order.  Discussed prescriptions and follow up appointments with the patient. Prescriptions given to patient, medication list explained in detail. Pt verbalized understanding.  Allergies as of 08/16/2017      Reactions   Benazepril    COUGH   Bactrim [sulfamethoxazole-trimethoprim] Nausea And Vomiting      Medication List    STOP taking these medications   potassium chloride SA 20 MEQ tablet Commonly known as:  K-DUR,KLOR-CON     TAKE these medications   amLODipine 5 MG tablet Commonly known as:  NORVASC Take 1 tablet (5 mg total) by mouth daily.   capecitabine 500 MG tablet Commonly known as:  XELODA Take 4 tablets (2,000 mg total) by mouth 2 (two) times daily after a meal. Take for 7 days then off 7 days   clindamycin 1 % gel Commonly known as:  CLINDAGEL Apply topically 2 (two) times daily.   diltiazem 240 MG 24 hr capsule Commonly known as:  CARDIZEM CD Take 1 capsule (240 mg total) by mouth daily.   diphenhydrAMINE 25 MG tablet Commonly known as:  BENADRYL Take 25 mg by mouth daily.   docusate sodium 100 MG capsule Commonly known as:  COLACE Take 1 capsule (100 mg total) by mouth 2 (two) times daily. Take once frequent BM's have subsided   escitalopram 10 MG tablet Commonly known as:  LEXAPRO Take 10 mg by mouth daily.   fexofenadine 180 MG tablet Commonly known as:  ALLEGRA Take 180 mg by mouth daily.   furosemide 40 MG tablet Commonly known as:  LASIX Take 40 mg by mouth daily as needed for edema.   latanoprost 0.005 % ophthalmic solution Commonly known as:  XALATAN Place 1 drop into both eyes at bedtime.   losartan 100 MG tablet Commonly known as:  COZAAR Take 1 tablet (100 mg total) by mouth daily.   metoprolol tartrate 25 MG tablet Commonly known as:  LOPRESSOR 1/2 TABLET BY MOUTH AS NEEDED FOR PALPITATIONS   omeprazole 40 MG capsule Commonly known as:  PRILOSEC Take 40 mg by mouth daily.   ondansetron  4 MG disintegrating tablet Commonly known as:  ZOFRAN ODT Take 1 tablet (4 mg total) by mouth every 8 (eight) hours as needed for nausea or vomiting.   oxyCODONE-acetaminophen 7.5-325 MG tablet Commonly known as:  PERCOCET Take 1 tablet by mouth every 8 (eight) hours as needed for severe pain.   polyethylene glycol packet Commonly known as:  MIRALAX / GLYCOLAX Take 17 g by mouth daily as needed for moderate constipation.   Potassium Chloride ER 20 MEQ Tbcr Take 20 mEq by mouth daily.   SYSTANE 0.4-0.3 % Soln Generic drug:  Polyethyl Glycol-Propyl Glycol Apply 1 drop to eye daily as needed (dry eyes).   traMADol 50 MG tablet Commonly known as:  ULTRAM Take 1 tablet (50 mg total) by mouth 3 (three) times daily as needed for moderate pain or severe pain. For increased pain.   VITEYES AREDS ADVANCED Caps Take 1 capsule by mouth daily.            Durable Medical Equipment  (From admission, onward)        Start     Ordered   08/15/17 1151  For home use only DME 3 n 1  Once     08/15/17 1151   08/15/17 1119  For home use only DME Gilford Rile  [facility])  Once    Question:  Patient needs  a walker to treat with the following condition  Answer:  Weakness   08/15/17 1120   08/15/17 0000  DME Bedside commode    Question:  Patient needs a bedside commode to treat with the following condition  Answer:  Weakness   08/15/17 1120      Vitals:   08/15/17 2038 08/16/17 0509  BP: 139/60 136/62  Pulse: 88 91  Resp: 18 17  Temp: 98.4 F (36.9 C) 98.2 F (36.8 C)  SpO2: 98% 95%    Skin clean, dry and intact without evidence of skin break down, no evidence of skin tears noted. IV catheter discontinued intact. Site without signs and symptoms of complications. Dressing and pressure applied. Pt denies pain at this time. No complaints noted.  An After Visit Summary was printed and given to the patient. Patient escorted via [place], and D/C home via private auto.  Haywood Lasso BSN, RN [employer] Phone [phone]

## 2017-08-16 NOTE — Progress Notes (Addendum)
Patient was seen and examined at bedside.  Did not leave yesterday because of waiting for bed at a skilled nursing facility.  As per nurses, patient is going home at this point. As per Education officer, museum, the plan is to discharge home and admit to SNF from home.  Patient has no nausea vomiting chest pain shortness of breath.  She is eating okay.  Diet is better.  Go home with home care services.  Recommend to follow-up with PCP.

## 2017-08-16 NOTE — Progress Notes (Signed)
CSW contacted by patient's RN and informed that patient needed to discharge to [facility] by noon.   CSW contacted [facility], staff member Judeen Hammans reported that patient does not have a bed available and that patient's information would have to be reviewed on Monday to offer a bed. CSW sent patient's information to SNF, staff confirmed receipt and reported that admissions staff member Alyse Low would review on Monday.   CSW followed up with patient/patient's daughter-in-law at bedside. CSW provided update about [facility] not having a bed available for patient today. Patient's daughter-in-law reported that [facility] was their second choice and would prefer to wait and go to [facility], which their first choice. Patient's daughter-in-law verbalized plan to take patient home and admit to SNF from home. CSW explained to patient's daughter about patient's Medicare 3 night stay and having a 30 day period to admit patient into SNF from home, patient's daughter-in-law verbalized understanding. CSW provided patient's daughter with contact information for both facilities and encouraged patient's daughter-in-law to follow up on Monday.   CSW sent patient's information to [facility].   Patient plan to discharge home and admit to SNF from home. CSW provided update to patient's RN and patient's attending MD. CSW signing off, no other needs identified at this time.  Abundio Miu, [place] Social Worker [facility] Cell#: [phone]

## 2017-08-18 ENCOUNTER — Telehealth: Payer: Self-pay | Admitting: *Deleted

## 2017-08-18 ENCOUNTER — Other Ambulatory Visit: Payer: Self-pay | Admitting: Hematology

## 2017-08-18 MED ORDER — OXYCODONE-ACETAMINOPHEN 7.5-325 MG PO TABS: 1.0000 | ORAL_TABLET | Freq: Three times a day (TID) | ORAL | 0 refills | Status: DC | PRN

## 2017-08-18 NOTE — Telephone Encounter (Signed)
Received call from daughter in-law [name] informing Dr. Burr Medico re:  Pt was discharged from the hospital on Sat.  Pt is awaiting bed at rehab facility near home.   [name] requested refill of Percocet 7.5- 325 mg.  Dr. Burr Medico notified. Spoke with Colletta Maryland, and was informed that Percocet works better for pt's pain relief.   Informed Colletta Maryland that script is ready for her to pick up.  Colletta Maryland voiced understanding. Informed Colletta Maryland of pt's appts on 08/21/17.  Colletta Maryland requested these appts to be cancelled - to give pt time to recuperate.   [name]'s    Phone     [phone].

## 2017-08-19 ENCOUNTER — Ambulatory Visit (HOSPITAL_COMMUNITY): Payer: Medicare Other

## 2017-08-21 ENCOUNTER — Ambulatory Visit: Payer: Medicare Other

## 2017-08-21 ENCOUNTER — Other Ambulatory Visit: Payer: Medicare Other

## 2017-08-21 ENCOUNTER — Ambulatory Visit: Payer: Medicare Other | Admitting: Hematology

## 2017-08-26 DIAGNOSIS — C7801 Secondary malignant neoplasm of right lung: Secondary | ICD-10-CM | POA: Diagnosis not present

## 2017-08-26 DIAGNOSIS — C7802 Secondary malignant neoplasm of left lung: Secondary | ICD-10-CM | POA: Diagnosis not present

## 2017-08-26 DIAGNOSIS — I10 Essential (primary) hypertension: Secondary | ICD-10-CM | POA: Diagnosis not present

## 2017-08-26 DIAGNOSIS — I481 Persistent atrial fibrillation: Secondary | ICD-10-CM | POA: Diagnosis not present

## 2017-08-26 DIAGNOSIS — C186 Malignant neoplasm of descending colon: Secondary | ICD-10-CM | POA: Diagnosis not present

## 2017-08-26 DIAGNOSIS — C787 Secondary malignant neoplasm of liver and intrahepatic bile duct: Secondary | ICD-10-CM | POA: Diagnosis not present

## 2017-08-27 ENCOUNTER — Other Ambulatory Visit: Payer: Self-pay | Admitting: Hematology

## 2017-08-27 DIAGNOSIS — C186 Malignant neoplasm of descending colon: Secondary | ICD-10-CM

## 2017-08-28 DIAGNOSIS — I10 Essential (primary) hypertension: Secondary | ICD-10-CM | POA: Diagnosis not present

## 2017-08-28 DIAGNOSIS — C186 Malignant neoplasm of descending colon: Secondary | ICD-10-CM | POA: Diagnosis not present

## 2017-08-28 DIAGNOSIS — C7801 Secondary malignant neoplasm of right lung: Secondary | ICD-10-CM | POA: Diagnosis not present

## 2017-08-28 DIAGNOSIS — I481 Persistent atrial fibrillation: Secondary | ICD-10-CM | POA: Diagnosis not present

## 2017-08-28 DIAGNOSIS — C7802 Secondary malignant neoplasm of left lung: Secondary | ICD-10-CM | POA: Diagnosis not present

## 2017-08-28 DIAGNOSIS — C787 Secondary malignant neoplasm of liver and intrahepatic bile duct: Secondary | ICD-10-CM | POA: Diagnosis not present

## 2017-08-31 ENCOUNTER — Other Ambulatory Visit: Payer: Self-pay | Admitting: Hematology

## 2017-09-01 ENCOUNTER — Telehealth: Payer: Self-pay | Admitting: *Deleted

## 2017-09-01 NOTE — Telephone Encounter (Signed)
TCT to pt's daughter-in-law regarding refill request for tramadol. Pt had 270 tablets filled on 07/24/17 ( a 3 month supply).  Pt was hospitalized on 08/07/17 and subsequently switched to percocet 7.5/325. Spoke with daughter-in-law, Colletta Maryland. She states that pt is still taking the percocet for pain and not using the tramadol.   No need to refill tramadol at this time.  Pt has appt for labs, Dr. Burr Medico, and possible treatment on this Thursday, 09/04/17

## 2017-09-02 DIAGNOSIS — I481 Persistent atrial fibrillation: Secondary | ICD-10-CM | POA: Diagnosis not present

## 2017-09-02 DIAGNOSIS — C7802 Secondary malignant neoplasm of left lung: Secondary | ICD-10-CM | POA: Diagnosis not present

## 2017-09-02 DIAGNOSIS — C186 Malignant neoplasm of descending colon: Secondary | ICD-10-CM | POA: Diagnosis not present

## 2017-09-02 DIAGNOSIS — C7801 Secondary malignant neoplasm of right lung: Secondary | ICD-10-CM | POA: Diagnosis not present

## 2017-09-02 DIAGNOSIS — I10 Essential (primary) hypertension: Secondary | ICD-10-CM | POA: Diagnosis not present

## 2017-09-02 DIAGNOSIS — C787 Secondary malignant neoplasm of liver and intrahepatic bile duct: Secondary | ICD-10-CM | POA: Diagnosis not present

## 2017-09-02 NOTE — Progress Notes (Signed)
[place]  [phone] [phone]  Clinic Follow Up Note   Patient Care Team: Moshe Cipro, MD as PCP - General (Internal Medicine) Alexis Frock, MD as Consulting Physician (Urology) Michael Boston, MD as Consulting Physician (General Surgery) Toma Deiters, MD as Referring Physician (Gastroenterology)   Date of Service:  09/04/2017  CHIEF COMPLAINTS:  Metastatic sigmoid colon Adenocarcinoma  Oncology History   Cancer Staging Cancer of left colon [facility]) Staging form: Colon and Rectum, AJCC 8th Edition - Clinical stage from 09/04/2016: Stage IVB (cTX, cNX, pM1b) - Signed by Truitt Merle, MD on 11/12/2016       Cancer of sigmoid colon ([place])   09/02/2016 Procedure    Colonoscopy Diverticulosis (scattered). Pt has about 10 cm structure starting at 20 cm with abnormal mucosa. Biopsies taken.       09/02/2016 Pathology Results    MODERATELY DIFFERENTIATED ADENOCARCINOMA OF COLON AT 20 CM      09/04/2016 Initial Diagnosis    Cancer of left colon ([place])      09/06/2016 Imaging    CT CAP w Contrast 1. No acute abdominopelvic process.  2. Heterogenous hypodense area involving the medial edge of the left kidney with multiple small soft tissue like stranding involving the left pararenal space. Findings are suspicious for malignancy of the left kidney with small pararenal metastases. MRI abdomen with contrast should be considered to further evaluate.  3. Vague hypodensity involving the lateral segment of the liver. This can also be reevaluated with MRI.  4. Partially visualized 5 mm posterior right lower lobe nodular density. CT chest without contrast follow up is recommended.  5. Complex multilobular left ovarian lesion may reflect a cluster of cysts.      10/10/2016 Procedure    Operative Report by Dr. Tresa Moore on 10/18/16 Procedure: 1) Cystoscopy, left retrograde pyelogram interpretation. 2) Left diagnostic uteroscopy. 3) Insertion of left uteral  stent; 5 x 24 Polaris with tether. Findings: 1) Unremarkable bladder. 2) Unremarkable left retrograde pyelogram. 3) No evidence of intraluminal urothelial neoplasm whatsoever with insepction of the left kidney and ureter times several. 4) Successful placement of left ureteral stent, proximal in renal pelvis and distal in urinary bladder.      10/15/2016 Imaging    MRI abdomen pelvis 1. There are 2 enhancing hepatic lesions highly worrisome for metastatic disease, likely metastatic colon cancer. 2. The lesion of concern in the anterior suprahilar lip of the left kidney is also enhancing, worrisome for neoplasm. This has a somewhat atypical appearance for renal cell carcinoma and could reflect a metastasis as well. 3. New left-sided hydroureter with delayed contrast excretion consistent with distal ureteral obstruction. Specific etiology uncertain. 4. Complex cystic and solid left adnexal lesion could reflect cystic neoplasm, although does not appear aggressive or appear to reflect the cause of the distal left ureteral obstruction. 5. Sigmoid colon wall thickening could be related to reported newly diagnosed colon cancer.      11/06/2016 PET scan    PET 11/06/2016 IMPRESSION: 1. Hypermetabolic pulmonary and hepatic lesions, most consistent with metastatic colon cancer, with the hypermetabolic primary located in the sigmoid colon. 2. Left renal and left adnexal lesions, better seen and evaluated on 10/15/2016. 3. Persistent moderate left hydronephrosis. 4. Aortic atherosclerosis (ICD10-170.0).      11/11/2016 Pathology Results    Liver, needle/core biopsy, left lobe - METASTATIC ADENOCARCINOMA. The histologic features are consistent with a primary gastrointestinal adenocarcinoma.      05/19/2017 Imaging    CT Abdomen  Pelvis, 05/19/2017 IMPRESSION: 1. Worsening pulmonary and hepatic metastatic disease. Primary sigmoid lesion is stable to minimally enlarged. 2. Stable to slight  enlargement of a left perinephric mass, likely a metastasis. Renal cell carcinoma is not excluded. Associated pelvocaliectasis versus mild hydronephrosis. 3. Borderline left periaortic lymph nodes, slightly enlarged. 4. Cholelithiasis. 5.  Aortic atherosclerosis (ICD10-170.0). 6. Cystic and solid lesion in the left adnexa, as on prior exams, likely ovarian in origin.      06/06/2017 -  Chemotherapy    IV antibody Vectibix every [redacted] weeks along with oral chemo Xeloda for 1 week on and 1 week off starting 06/06/17.   For cycle 2 she had 2018m Xeloda for 10 days and then 10 days off. Return to 1 week on and 1 week off with starting with Cycle 3. Held after 07/24/17 due to hospitalization for SBO. Restarted Xeloda and Vectibix on 09/04/17      08/07/2017 - 08/16/2017 Hospital Admission    Admit date: 08/07/17 Admission diagnosis: Bowel Obstruction  Additional comments: She was admitted to the hospital on 08/07/17 after [facility] due to a bowel obstruction.  She had a sigmoid colon stent placement on 08/11/17. Bowel obstruction resolved.  CT scan in hospital showed great partial response to chemotherapy. She will continue chemotherapy given good overall tolerance.        08/07/2017 Imaging    CT AP W Contrast 08/07/17 IMPRESSION: Low colonic obstruction secondary to a rectosigmoid stricture at the site of the patient's prior colonic mass, which is no longer evident on CT. Mild upstream colonic wall thickening raises the possibility of superimposed colitis.  Improving hepatic metastases, as above. Left perirenal metastasis, decreased. Visualized pulmonary metastases at the lung bases are grossly unchanged.  3.5 cm mixed cystic/solid left ovarian mass is mildly decreased.       08/11/2017 Procedure    By Dr. MWatt Climes Flexible Sigmoidoscopy 08/11/17 IMPRESSION:   - Preparation of the colon was fair. - Likely malignant partially obstructing tumor in the  recto-sigmoid colon versus necrotic scarring. Injected with contrast proximal to obstruction to confirm proper position. Prosthesis placed. - Stool in the rectum. - No specimens collected.       HISTORY OF PRESENTING ILLNESS (10/25/2016):  ADORRINE MONTONE89y.o. female is here because of adenocarcinoma of the colon and a left renal mass. The pt noticed worsening episodes of abdominal cramping and nausea with bowel movements in July 2017. She had an abdominal [place]aper[place], which was unremarkable. A colonoscopy was then performed at the patient's request, which found a mass about 20 cm from the anal verge. Biopsy showed moderately differentiated adenocarcinoma. MRI done, with a lesion noticed in the left kidney near the pelvis, a 11 mm hepatic lesion, 4 mm lung nodule, and possible cystic left ovarian mass. Pt saw Dr. GJohney Mainefor information on abdominal surgery for her colon, which she declined. She also saw Dr. MLorna Dibble who is considering a nephrectomy, but is planning a sternoscopy to better characterize the kidney lesion. She presents today for continued treatment.    She presents today with her son and daughter-in-law. The symptoms started in late July 2017. She had some vomiting, abdominal pain, and constipation, but not bleeding. Her PCP never found any anemia. Initially she lost her appetite, but now she is taking multiple vitamins and supplements, so her appetite is back. Her bowel has improved, but she still has abdominal pain that comes and goes, lasting minutes. The pain is tolerable  with aleve. She did not tolerate Tramadol well; nausea and vomiting. She has some occasional left sides back pain. Denies weight loss, or any other concerns. She is able to do everything she needs to around the house.   She had a partial hysterectomy, and a lumpectomy. She has a history of HTN, atrial fibrillation, high cholesterol. She has some hearing loss and can not see out of her right eye. Not a diabetic, but  her son and her mother are. No family history of colon cancer. Her sister had breast cancer in her 62's and her brother had lung cancer in his 61's. Never smoker. Doesn't drink alcohol. 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 She has two sons; one lives an hour away, and the other lives in [place]. She lives alone.   CURRENT THERAPY:   IV antibody Vectibix every [redacted] weeks along with oral chemo Xeloda for 1 week on and 1 week off starting 06/06/17. For cycle 2 she had 2030m Xeloda for 10 days and then 10 days off. Return to 1 week on and 1 week off with starting with Cycle 3. Held after 07/24/17 due to hospitalization for SBO. Restarted Xeloda and Vectibix on 09/04/17  INTERVAL HISTORY:   ADESTANEE BEDONIEis here for a follow up. She presents in the clinic today accompanied by her daughter-in-law.   Of note, She was hospitalized on 08/07/17 for a bowel obstruction. CT AP from 08/07/17 showed evidence of the bowel obstruction as well as good partial response to chemotherapy treatment. She had a sigmoid colon stent placed on 08/11/17 and SBO was resolved.  Today she notes she has daily BM with soft stool to "broken pieces" of stool. She notes her pain is low and manageable. She is still on low fiber diet. Her diet is low but she still eats. She notes her night sweats are more severe. She takes 1 Percocet in the evening to help her when she goes to bed. Her daughter-in-law she wants to know how she can take her pain medication and if she can get a refill.   On review of symptoms, pt  Notes more severe night sweats, improved BMs, low appetite. She notes a new rash on her chest and back unrelated to Vectibix.  She denies chills and fever.      MEDICAL HISTORY:  Past Medical History:  Diagnosis Date  . Aneurysm of ascending aorta ([facility]) 12/03/2016   3.7 cm by echo 10/2016  . Colon cancer ([facility]    dx via biospy from colonoscopy-- malignant ---  currently having work-up done w/ dr gross (general surgeon) and  coordinate surgery w/ urologsit for renal tumor  . Essential hypertension 10/31/2016  . GERD (gastroesophageal reflux disease)   . Glaucoma   . Hiatal hernia   . HOH (hard of hearing)    bilateral even w/ hearing aids  . Hyperlipidemia   . Kidney tumor    left side  . Legally blind in right [place], [place]   due to macular degeneration  . Macular degeneration   . Nausea    intermittant due to colon mass  . Ovarian cyst   . Persistent [place] fibrillation ([place] 10/31/2016   on ASA only  . PONV (postoperative nausea and vomiting)   . Wears glasses   . Wears hearing aid    bilateral    SURGICAL HISTORY: Past Surgical History:  Procedure Laterality Date  . BREAST CYST EXCISION  1990   benign  .  COLONOSCOPY  09/04/2016  . CYSTOSCOPY WITH RETROGRADE PYELOGRAM, URETEROSCOPY AND STENT PLACEMENT Left 10/10/2016   Procedure: CYSTOSCOPY WITH RETROGRADE PYELOGRAM, DIAGNOSTIC URETEROSCOPY  AND STENT PLACEMENT;  Surgeon: Alexis Frock, MD;  Location: [facility];  Service: Urology;  Laterality: Left;  . ESOPHAGOGASTRODUODENOSCOPY  11/07/2014  . FLEXIBLE SIGMOIDOSCOPY N/A 08/11/2017   Procedure: FLEXIBLE SIGMOIDOSCOPY;  Surgeon: Clarene Essex, MD;  Location: [facility];  Service: Endoscopy;  Laterality: N/A;  With Fluoroscopy for colonic stent placement for colonic stricture  . NASAL SINUS SURGERY    . TONSILLECTOMY      SOCIAL HISTORY: Social History   Socioeconomic History  . Marital status: Widowed    Spouse name: Not on file  . Number of children: Not on file  . Years of education: Not on file  . Highest education level: Not on file  Social Needs  . Financial resource strain: Not on file  . Food insecurity - worry: Not on file  . Food insecurity - inability: Not on file  . Transportation needs - medical: Not on file  . Transportation needs - non-medical: Not on file  Occupational History  . Not on file  Tobacco Use  . Smoking status: Never Smoker  .  Smokeless tobacco: Never Used  Substance and Sexual Activity  . Alcohol use: No  . Drug use: No  . Sexual activity: Not on file  Other Topics Concern  . Not on file  Social History Narrative  . Not on file    FAMILY HISTORY: Family History  Problem Relation Age of Onset  . Cancer Sister 24       breast cancer   . Cancer Brother 86       lung cancer   . Diabetes Son   . High blood pressure Son   . Peripheral Artery Disease Mother   . High blood pressure Mother     ALLERGIES:  is allergic to benazepril and bactrim [sulfamethoxazole-trimethoprim].  MEDICATIONS:  Current Outpatient Medications  Medication Sig Dispense Refill  . amLODipine (NORVASC) 5 MG tablet Take 1 tablet (5 mg total) by mouth daily. 90 tablet 1  . clindamycin (CLINDAGEL) 1 % gel Apply topically 2 (two) times daily. 60 g 3  . diltiazem (CARDIZEM CD) 240 MG 24 hr capsule Take 1 capsule (240 mg total) by mouth daily. 90 capsule 1  . diphenhydrAMINE (BENADRYL) 25 MG tablet Take 25 mg by mouth as needed.     . docusate sodium (COLACE) 100 MG capsule Take 1 capsule (100 mg total) by mouth 2 (two) times daily. Take once frequent BM's have subsided 60 capsule 0  . escitalopram (LEXAPRO) 10 MG tablet Take 10 mg by mouth daily.    . fexofenadine (ALLEGRA) 180 MG tablet Take 180 mg by mouth daily.    . furosemide (LASIX) 40 MG tablet Take 40 mg by mouth daily as needed for edema.    Marland Kitchen latanoprost (XALATAN) 0.005 % ophthalmic solution Place 1 drop into both eyes at bedtime.    Marland Kitchen losartan (COZAAR) 100 MG tablet Take 1 tablet (100 mg total) by mouth daily. 30 tablet 0  . metoprolol tartrate (LOPRESSOR) 25 MG tablet 1/2 TABLET BY MOUTH AS NEEDED FOR PALPITATIONS 30 tablet 1  . Multiple Vitamins-Minerals (VITEYES AREDS ADVANCED) CAPS Take 1 capsule by mouth daily.    Marland Kitchen omeprazole (PRILOSEC) 40 MG capsule Take 40 mg by mouth daily.    . ondansetron (ZOFRAN ODT) 4 MG disintegrating tablet Take 1 tablet (4 mg  total) by mouth  every 8 (eight) hours as needed for nausea or vomiting. 30 tablet 0  . oxyCODONE-acetaminophen (PERCOCET) 7.5-325 MG tablet Take 1 tablet by mouth every 8 (eight) hours as needed for severe pain. 60 tablet 0  . Polyethyl Glycol-Propyl Glycol (SYSTANE) 0.4-0.3 % SOLN Apply 1 drop to eye daily as needed (dry eyes).     . polyethylene glycol (MIRALAX / GLYCOLAX) packet Take 17 g by mouth daily as needed for moderate constipation. 30 each 0  . Potassium Chloride ER 20 MEQ TBCR Take 20 mEq by mouth daily. 30 tablet 1  . capecitabine (XELODA) 500 MG tablet Take 4 tablets (2,000 mg total) by mouth 2 (two) times daily after a meal. Take for 7 days then off 7 days (Patient not taking: Reported on 09/04/2017) 112 tablet 2   Current Facility-Administered Medications  Medication Dose Route Frequency Provider Last Rate Last Dose  . 0.9 %  sodium chloride infusion  250 mL Intravenous Continuous Skeet Latch, MD      . sodium chloride flush (NS) 0.9 % injection 3 mL  3 mL Intravenous Q12H Skeet Latch, MD      . sodium chloride flush (NS) 0.9 % injection 3 mL  3 mL Intravenous PRN Skeet Latch, MD        REVIEW OF SYSTEMS:   Constitutional: Denies fevers, chills or abnormal (+) more severe night sweats (+) low appetite  Eyes: Denies blurriness of vision, double vision or watery eyes Ears, nose, mouth, throat, and face: Denies mucositis or sore throat Respiratory: Denies cough, dyspnea or wheezes Cardiovascular: Denies palpitation, chest discomfort  Gastrointestinal:  Denies heartburn  Skin: (+) New rash on chest and back unrelated to Vectibix Lymphatics: Denies new lymphadenopathy or easy bruising MSK: negative Neurological:Denies numbness, tingling or new weaknesses Behavioral/Psych: Mood is stable, no new changes  All other systems were reviewed with the patient and are negative.  PHYSICAL EXAMINATION:  ECOG PERFORMANCE STATUS: 1 - Symptomatic but completely ambulatory  Vitals:    09/04/17 0833  BP: 124/73  Pulse: 75  Resp: 16  Temp: 98.6 F (37 C)  SpO2: 98%   Filed Weights   09/04/17 0833  Weight: 172 lb 12.8 oz (78.4 kg)     GENERAL:alert, no distress and comfortable SKIN: skin color, texture, turgor are normal (+) a few acne-like rash on face, no visible rash or skin erythema on scalp, dry skin on palms, (+) skin crack on left thumb  EYES: normal, conjunctiva are pink and non-injected, sclera clear OROPHARYNX:no exudate, no erythema and lips, buccal mucosa, and tongue normal  NECK: supple, thyroid normal size, non-tender, without nodularity LYMPH:  no palpable lymphadenopathy in the cervical, axillary or inguinal LUNGS: clear to auscultation and percussion with normal breathing effort HEART: regular rate & rhythm and no murmurs and (+)symmetrical lower extremity edema  ABDOMEN:abdomen soft, non-tender and normal bowel sounds Musculoskeletal:no cyanosis of digits and no clubbing  PSYCH: alert & oriented x 3 with fluent speech NEURO: no focal motor/sensory deficits   LABORATORY DATA:  I have reviewed the data as listed CBC Latest Ref Rng & Units 09/04/2017 08/14/2017 08/13/2017  WBC 3.9 - 10.3 K/uL 8.5 10.9(H) 9.9  Hemoglobin 11.6 - 15.9 g/dL 11.7 12.0 12.1  Hematocrit 34.8 - 46.6 % 36.5 36.0 36.5  Platelets 145 - 400 K/uL 366 317 315   CMP Latest Ref Rng & Units 09/04/2017 08/15/2017 08/14/2017  Glucose 70 - 140 mg/dL 73 105(H) 94  BUN 7 - 26 mg/dL  11 6 <5(L)  Creatinine 0.60 - 1.10 mg/dL 0.82 0.72 0.67  Sodium 136 - 145 mmol/L 139 139 140  Potassium 3.3 - 4.7 mmol/L 4.3 3.9 3.0(L)  Chloride 98 - 109 mmol/L 107 111 110  CO2 22 - 29 mmol/L 23 23 24   Calcium 8.4 - 10.4 mg/dL 9.0 8.4(L) 8.1(L)  Total Protein 6.4 - 8.3 g/dL 7.0 - 5.1(L)  Total Bilirubin 0.2 - 1.2 mg/dL 0.3 - 0.9  Alkaline Phos 40 - 150 U/L 129 - 81  AST 5 - 34 U/L 19 - 25  ALT 0 - 55 U/L 19 - 38   PATHOLOGY: [facility] 11/11/16   Diagnosis 11/11/16 Liver, needle/core biopsy, left  lobe - METASTATIC ADENOCARCINOMA. - SEE COMMENT. Microscopic Comment The histologic features are consistent with a primary gastrointestinal adenocarcinoma. Dr. Vicente Males has reviewed the case and concurs with this interpretation. There is likely sufficient tumor present for additional studies, if requested. Enid Cutter MD Pathologist, Electronic Signature (Case signed 11/12/2016)  Diagnosis 09/04/2016 MODERATELY DIFFERENTIATED ADENOCARCINOMA OF COLON AT 20 CM Tumor Site: Colon at 20 cm Tumor Type: Adenocarcinoma Tumore Size: Unknown  Grade: Moderately differentiated   RADIOGRAPHIC STUDIES: I have personally reviewed the radiological images as listed and agreed with the findings in the report.   CT AP W Contrast 08/07/17 IMPRESSION: Low colonic obstruction secondary to a rectosigmoid stricture at the site of the patient's prior colonic mass, which is no longer evident on CT. Mild upstream colonic wall thickening raises the possibility of superimposed colitis. Improving hepatic metastases, as above. Left perirenal metastasis, decreased. Visualized pulmonary metastases at the lung bases are grossly unchanged. 3.5 cm mixed cystic/solid left ovarian mass is mildly decreased.   CT Chest 06/06/17  IMPRESSION: 1. There are multiple new and enlarging pulmonary nodules throughout the lungs bilaterally. Additionally, a few of the nodules have decreased in size when compared to prior. 2. Interval increase in size of precarinal lymph node. 3. Interval increase in size of hepatic lesions. 4. Aortic Atherosclerosis (ICD10-I70.0).  CT Abdomen Pelvis, 05/19/2017 IMPRESSION: 1. Worsening pulmonary and hepatic metastatic disease. Primary sigmoid lesion is stable to minimally enlarged. 2. Stable to slight enlargement of a left perinephric mass, likely a metastasis. Renal cell carcinoma is not excluded. Associated pelvocaliectasis versus mild hydronephrosis. 3. Borderline left  periaortic lymph nodes, slightly enlarged. 4. Cholelithiasis. 5.  Aortic atherosclerosis (ICD10-170.0). 6. Cystic and solid lesion in the left adnexa, as on prior exams, likely ovarian in origin.  PET 11/06/2016 IMPRESSION: 1. Hypermetabolic pulmonary and hepatic lesions, most consistent with metastatic colon cancer, with the hypermetabolic primary located in the sigmoid colon. 2. Left renal and left adnexal lesions, better seen and evaluated on 10/15/2016. 3. Persistent moderate left hydronephrosis. 4. Aortic atherosclerosis (ICD10-170.0).  MRI Abdomen Pelvis w/wo Contrast 10/15/2016 IMPRESSION: 1. There are 2 enhancing hepatic lesions highly worrisome for metastatic disease, likely metastatic colon cancer. 2. The lesion of concern in the anterior suprahilar lip of the left kidney is also enhancing, worrisome for neoplasm. This has a somewhat atypical appearance for renal cell carcinoma and could reflect a metastasis as well. 3. New left-sided hydroureter with delayed contrast excretion consistent with distal ureteral obstruction. Specific etiology uncertain. 4. Complex cystic and solid left adnexal lesion could reflect cystic neoplasm, although does not appear aggressive or appear to reflect the cause of the distal left ureteral obstruction. 5. Sigmoid colon wall thickening could be related to reported newly diagnosed colon cancer.  CT CAP w Contrast 09/06/2016 (done outside) IMPRESSION:  1. No acute abdominopelvic process.  2. Heterogenous hypodense area involving the medial edge of the left kidney with multiple small soft tissue like stranding involving the left pararenal space. Findings are suspicious for malignancy of the left kidney with small pararenal metastases. MRI abdomen with contrast should be considered to further evaluate.  3. Vague hypodensity involving the lateral segment of the liver. This can also be reevaluated with MRI.  4. Partially visualized 5 mm posterior  right lower lobe nodular density. CT chest without contrast follow up is recommended.  5. Complex multilobular left ovarian lesion may reflect a cluster of cysts.    PROCEDURES  Flexible Sigmoidoscopy 08/11/17 IMPRESSION:   - Preparation of the colon was fair. - Likely malignant partially obstructing tumor in the recto-sigmoid colon versus necrotic scarring. Injected with contrast proximal to obstruction to confirm proper position. Prosthesis placed. - Stool in the rectum. - No specimens collected.   Echocardiogram 11/03/16 - Left ventricle: The cavity size was normal. Wall thickness was   increased in a pattern of mild LVH. Indeterminate diastolic   function (atrial fibrillation). Systolic function was normal. The   estimated ejection fraction was in the range of 60% to 65%.   Operative Report by Dr. Tresa Moore on 10/10/16 Procedure: 1) Cystoscopy, left retrograde pyelogram interpretation. 2) Left diagnostic uteroscopy. 3) Insertion of left uteral stent; 5 x 24 Polaris with tether. Findings: 1) Unremarkable bladder. 2) Unremarkable left retrograde pyelogram. 3) No evidence of intraluminal urothelial neoplasm whatsoever with inspection of the left kidney and ureter times several. 4) Successful placement of left ureteral stent, proximal in renal pelvis and distal in urinary bladder.  Colonoscopy 09/02/2016 FINDINGS: Diverticulosis (scattered). Pt has about 10 cm structure starting at 20 cm with abnormal mucosa. The colonoscopy scope was not able to advance due to structure, and upper endoscopy scope was used and it was advanced through the stricture to see ileocecal valve. Biopsies at the stricture were taken.   ASSESSMENT & PLAN:  Mary [name] is a 84 y.o. female   1. Adenocarcinoma of left colon, sigmoid colon, [id number] with liver and lung mets, stage IV, KRAS/NRS wild type, MSI-stable  -I previously discussed the imaging, colonoscopy and biopsy results with the patient and her family in  detail.  -She has seen Drs. Manny and Gross, surgical resections were discussed.  -PET scan on 11/06/16 showed hypermetabolic pulmonary and hepatic lesions consistent with metastatic colon cancer with the primary located in the sigmoid colon. I reviewed the images with pt and her family members in person  -US biopsy on 11/11/16 of the left lobe of the liver revealed metastatic adenocarcinoma. The features are consistent with a primary gastrointestinal adenocarcinoma. I discussed with pt  -We previously discussed that her metastatic cancer is incurable at this stage due to the multiorgan metastasis, and her advanced age. The goal of therapy is palliative. -We discussed the indication for surgery, including significant bleeding, obstruction, or perforation. She does have mild constipation and intermittent abdominal cramps, but no significant bowel obstruction. Patient understands the surgery is palliative. She prefers to avoid surgery at this point.  -We reviewed [facility] test results, which showed MSI- stable, no KRAS/NRAS or BRAF mutations. Based on this, she is not a candidate for immunotherapy, but she will likely benefit from EGFR inhibitor, giving the left side colon cancer. --The [facility] results showed she is not a candidate for immunotherapy, but she is a candidate for EGFR antibody therapy. -We discussed that the prognosis of left side colon  cancer without KRAS/NRAS and BRAF mutations is much better than right side colon cancer, especially with EGFR antibody treatment. -We discussed the options of systemic therapy. Due to her advanced age, she is not a candidate for intensive chemotherapy. I recommend that panitumumab, an EGFR inhibitor with low intensity chemo (5-FU infusion or Xeloda to control her cancer. -We discussed alternative management options with palliative care alone, which will be also appropriate given her advanced age and medical comorbidities. -She has recently developed  worsening abdominal pain, constipation and nausea, concerning for bowel obstruction from her sigmoid colon cancer. I recommended she increase Miralax and Colace.  - I recommended low residual, low fiber diet, she is tolerating well, her nausea and abdominal discomfort has improved -I reviewed her CT abdomen and pelvis from 05/19/17, which showed disease progression in liver and lung, the primary sigmoid colon mass is stable, no imaging evidence of bowel obstruction. -On 06/06/17 she started first line Vectibix every [redacted] weeks along with oral chemo Xeloda for 1 week on and 1 week off. She has tolerated well overall, except mild skin rash -She was hospitalized on 08/07/17 for bowel obstruction. She had a sigmoid colon stent placement on 08/11/17 to resolve this. She has recovered well  -Her 08/07/17 CT AP showed which showed great partial response to chemotherapy, no new mets. Given her excellent tolerance to treatment and good response I discussed continuing with chemotherapy.  Certainly given her advanced age, if she has more side effects, will likely stop her treatment and switch to comfort care.  We had a long conversation about this, and discussed the goal of care again.  Pt finally decided to continue with treatment  -Labs reviewed, potassium, anemia and kidney/liver tests normalized. Magnesium is slightly elevated at 2.6.  -I again discussed she is fine to continue treatment if she is willing to proceed. She agreed to proceed with treatment.  -She will restart Xeloda at 2000 mg BID and IV Vectibix today  -f/u in 2 weeks with Lacie and 4 weeks with me.  --plan to repeat staging scan in 2 months    2. Left renal mass and left hydronephrosis -Possible renal cell carcinoma, metastatic lesion from colon cancer is also a possibility. -She has been seen by [facility], Dr. Tresa Moore, and had left ureter stent placement on 10/10/2016.  -08/07/17 CT shows left perirenal mets have decreased.   3. Complex cyst of left  ovary -will monitor. Likely benign -If she undergo abdominal surgery, may remove it -She declines surgery for now -08/07/17 CT shows mild decrease in size of cyst  4. Atrial Fibrillation -The patient has a history of atrial fibrillation. -Her cardiologist wants her to undergo cardio inversion and anticoagulation. -I previously advised the patient that even if she undergoes cardio inversion, her Afib might return. Also, her anticoagulation medication might make her bleeding worse. -The patient should continue taking baby aspirin.  5. HTN -I previously advised the patient to continue medications and follow up with primary care physician  6. Nutrition -We previously discussed palliative chemotherapy will make her fatigued, nauseous, and loss of appetite. -I previously stressed the importance of a healthy diet with plenty of protein. -I previously referred to Nutrition.  -Her eating has improved.   7. Left flank and abdominal pain  -The patient is taking Aleve for pain. -I prescribed Tylenol #3 on 11/13/16. -The patient discontinued Tylenol #3 due to chest discomfort and headaches. -she may try tramadol, she is able to tolerate tramadol -Her abdominal pain, much  worse due to the bowel obstruction in late December 2018, I have started her on Percocet, she is taking 1-2 tablets a day, pain overall much improved and controlled  -Refilled percocet today   8. Anemia  -The patient's Hb is 11.8 on 4.4 and has decreased from 12.3 on 10/31/16 and 13.9 on 10/10/16. This is blood loss from her tumor. -I advised the patient to take an baby dose iron pills and eat iron rich foods. -She is to drink plenty of fluids to prevent constipation. -Hg 11.9 on 05/08/17 -currently resolved.   9. Goal of care discussion  -We again discussed the incurable nature of her cancer, and the overall poor prognosis, especially if she does not have good response to chemotherapy or progress on chemo -The patient  understands the goal of care is palliative. -I previously recommended DNR/DNI, she will think about it   10. LE swelling -She has swelling in lower left leg and both feet  -she wear compression socks   11. Palmer erythema and dryness, Facial Acne type rash, secondary to chemo -Continue hydrocortisone for acneiform rash -Eucerin, oatmeal bath, po benadryl PRN and Claritin for body itching -I will call in clindamycin gel today 07/09/17, refilled on 07/24/17 -if rash gets worse, will consider oral doxycycline  For her hands I suggest a vinegar and water soak along with daily neosporin around her nails. I previously recommend she use lotion after she washes her hands and advices her to wear cotton glove to protect her skin. -Her previous Vectibix rash is resolved.  -Her current rash on the chest and back is not from treatment. I suggest she use OTC benadryl. Will monitor.   12. Hypokalemia  -K 3.3 on 07/24/17, no diarrhea -I previously called in KCL 32mq daily for 5 days for her -Potassium normalized at 4.3 as of 09/04/17. She is fine to stop oral potassium. If she develops diarrhea again she can take one tablet once daily until diarrhea resolves.   13. SBO, s/p sigmoid stent placement  -Hospitalized on 08/07/17, and had stent placed on 08/11/17.  -Now resolved.   14. Night Sweats -Recently progressed, no fever or chills. Possible related to her malignancy  -I recommend she check her temperature when she experiences this.  -Will monitor    Plan  -Refilled percocet and potassium today  -Labs reviewed and adequate to restart Xeloda 2000 mg BID, one week on and one week off,  and panitumumab today  -Lab, f/u with Lacie or me and panitumumab in 2, 4 and 6 weeks    All questions were answered. The patient knows to call the clinic with any problems, questions or concerns. I spent a total of 45 minutes for her visit, more than 50% face-to-face counseling.   YTruitt Merle MD 09/04/2017    This document serves as a record of services personally performed by YTruitt Merle MD. It was created on her behalf by AJoslyn Devon a trained medical scribe. The creation of this record is based on the scribe's personal observations and the provider's statements to them.    I have reviewed the above documentation for accuracy and completeness, and I agree with the above.

## 2017-09-03 ENCOUNTER — Other Ambulatory Visit: Payer: Self-pay | Admitting: Hematology

## 2017-09-03 DIAGNOSIS — C7801 Secondary malignant neoplasm of right lung: Secondary | ICD-10-CM | POA: Diagnosis not present

## 2017-09-03 DIAGNOSIS — C186 Malignant neoplasm of descending colon: Secondary | ICD-10-CM | POA: Diagnosis not present

## 2017-09-03 DIAGNOSIS — C7802 Secondary malignant neoplasm of left lung: Secondary | ICD-10-CM | POA: Diagnosis not present

## 2017-09-03 DIAGNOSIS — I481 Persistent atrial fibrillation: Secondary | ICD-10-CM | POA: Diagnosis not present

## 2017-09-03 DIAGNOSIS — C187 Malignant neoplasm of sigmoid colon: Secondary | ICD-10-CM

## 2017-09-03 DIAGNOSIS — I10 Essential (primary) hypertension: Secondary | ICD-10-CM | POA: Diagnosis not present

## 2017-09-03 DIAGNOSIS — C787 Secondary malignant neoplasm of liver and intrahepatic bile duct: Secondary | ICD-10-CM | POA: Diagnosis not present

## 2017-09-04 ENCOUNTER — Inpatient Hospital Stay (HOSPITAL_BASED_OUTPATIENT_CLINIC_OR_DEPARTMENT_OTHER): Payer: Medicare Other | Admitting: Hematology

## 2017-09-04 ENCOUNTER — Inpatient Hospital Stay: Payer: Medicare Other | Attending: Hematology

## 2017-09-04 ENCOUNTER — Other Ambulatory Visit: Payer: Self-pay | Admitting: Pharmacist

## 2017-09-04 ENCOUNTER — Inpatient Hospital Stay: Payer: Medicare Other

## 2017-09-04 ENCOUNTER — Encounter: Payer: Self-pay | Admitting: Hematology

## 2017-09-04 VITALS — BP 124/73 | HR 75 | Temp 98.6°F | Resp 16 | Ht 66.0 in | Wt 172.8 lb

## 2017-09-04 DIAGNOSIS — R109 Unspecified abdominal pain: Secondary | ICD-10-CM

## 2017-09-04 DIAGNOSIS — N951 Menopausal and female climacteric states: Secondary | ICD-10-CM | POA: Insufficient documentation

## 2017-09-04 DIAGNOSIS — R194 Change in bowel habit: Secondary | ICD-10-CM | POA: Insufficient documentation

## 2017-09-04 DIAGNOSIS — N83202 Unspecified ovarian cyst, left side: Secondary | ICD-10-CM | POA: Diagnosis not present

## 2017-09-04 DIAGNOSIS — N289 Disorder of kidney and ureter, unspecified: Secondary | ICD-10-CM

## 2017-09-04 DIAGNOSIS — C7800 Secondary malignant neoplasm of unspecified lung: Secondary | ICD-10-CM | POA: Insufficient documentation

## 2017-09-04 DIAGNOSIS — I4891 Unspecified atrial fibrillation: Secondary | ICD-10-CM

## 2017-09-04 DIAGNOSIS — Z5112 Encounter for antineoplastic immunotherapy: Secondary | ICD-10-CM | POA: Insufficient documentation

## 2017-09-04 DIAGNOSIS — R21 Rash and other nonspecific skin eruption: Secondary | ICD-10-CM | POA: Insufficient documentation

## 2017-09-04 DIAGNOSIS — C186 Malignant neoplasm of descending colon: Secondary | ICD-10-CM

## 2017-09-04 DIAGNOSIS — I10 Essential (primary) hypertension: Secondary | ICD-10-CM

## 2017-09-04 DIAGNOSIS — C787 Secondary malignant neoplasm of liver and intrahepatic bile duct: Secondary | ICD-10-CM

## 2017-09-04 DIAGNOSIS — C187 Malignant neoplasm of sigmoid colon: Secondary | ICD-10-CM | POA: Insufficient documentation

## 2017-09-04 DIAGNOSIS — Z7189 Other specified counseling: Secondary | ICD-10-CM | POA: Diagnosis not present

## 2017-09-04 LAB — COMPREHENSIVE METABOLIC PANEL
ALBUMIN: 3.1 g/dL — AB (ref 3.5–5.0)
ALK PHOS: 129 U/L (ref 40–150)
ALT: 19 U/L (ref 0–55)
AST: 19 U/L (ref 5–34)
Anion gap: 9 (ref 3–11)
BILIRUBIN TOTAL: 0.3 mg/dL (ref 0.2–1.2)
BUN: 11 mg/dL (ref 7–26)
CO2: 23 mmol/L (ref 22–29)
CREATININE: 0.82 mg/dL (ref 0.60–1.10)
Calcium: 9 mg/dL (ref 8.4–10.4)
Chloride: 107 mmol/L (ref 98–109)
GFR calc Af Amer: >60 mL/min (ref >60)
Glucose, Bld: 73 mg/dL (ref 70–140)
POTASSIUM: 4.3 mmol/L (ref 3.3–4.7)
Sodium: 139 mmol/L (ref 136–145)
TOTAL PROTEIN: 7 g/dL (ref 6.4–8.3)

## 2017-09-04 LAB — CBC WITH DIFFERENTIAL/PLATELET
BASOS ABS: 0 10*3/uL (ref 0.0–0.1)
Basophils Relative: 0 %
EOS PCT: 5 %
Eosinophils Absolute: 0.4 10*3/uL (ref 0.0–0.5)
HCT: 36.5 % (ref 34.8–46.6)
Hemoglobin: 11.7 g/dL (ref 11.6–15.9)
LYMPHS PCT: 22 %
Lymphs Abs: 1.9 10*3/uL (ref 0.9–3.3)
MCH: 30.8 pg (ref 25.1–34.0)
MCHC: 32.1 g/dL (ref 31.5–36.0)
MCV: 96.1 fL (ref 79.5–101.0)
MONO ABS: 1.5 10*3/uL — AB (ref 0.1–0.9)
Monocytes Relative: 18 %
Neutro Abs: 4.6 10*3/uL (ref 1.5–6.5)
Neutrophils Relative %: 55 %
PLATELETS: 366 10*3/uL (ref 145–400)
RBC: 3.8 MIL/uL (ref 3.70–5.45)
RDW: 16.8 % — AB (ref 11.2–16.1)
WBC: 8.5 10*3/uL (ref 3.9–10.3)

## 2017-09-04 LAB — CEA (IN HOUSE-CHCC): CEA (CHCC-IN HOUSE): 2.64 ng/mL (ref 0.00–5.00)

## 2017-09-04 LAB — MAGNESIUM: Magnesium: 2.6 mg/dL — ABNORMAL HIGH (ref 1.5–2.5)

## 2017-09-04 MED ORDER — POTASSIUM CHLORIDE ER 20 MEQ PO TBCR: 20.0000 meq | EXTENDED_RELEASE_TABLET | Freq: Every day | ORAL | 1 refills | Status: DC

## 2017-09-04 MED ORDER — OXYCODONE-ACETAMINOPHEN 7.5-325 MG PO TABS: 1.0000 | ORAL_TABLET | Freq: Three times a day (TID) | ORAL | 0 refills | Status: DC | PRN

## 2017-09-04 MED ORDER — PANITUMUMAB CHEMO INJECTION 100 MG/5ML
6.0000 mg/kg | Freq: Once | INTRAVENOUS | Status: AC
  Administered 2017-09-04: 500 mg via INTRAVENOUS
  Filled 2017-09-04: qty 20

## 2017-09-04 MED ORDER — SODIUM CHLORIDE 0.9 % IV SOLN
Freq: Once | INTRAVENOUS | Status: AC
  Administered 2017-09-04: 10:00:00 via INTRAVENOUS

## 2017-09-04 NOTE — Patient Instructions (Signed)
[facility] Discharge Instructions for Patients Receiving Chemotherapy  Today you received the following chemotherapy agents: Vectibix.  To help prevent nausea and vomiting after your treatment, we encourage you to take your nausea medication as directed.   If you develop nausea and vomiting that is not controlled by your nausea medication, call the clinic.   BELOW ARE SYMPTOMS THAT SHOULD BE REPORTED IMMEDIATELY:  *FEVER GREATER THAN 100.5 F  *CHILLS WITH OR WITHOUT FEVER  NAUSEA AND VOMITING THAT IS NOT CONTROLLED WITH YOUR NAUSEA MEDICATION  *UNUSUAL SHORTNESS OF BREATH  *UNUSUAL BRUISING OR BLEEDING  TENDERNESS IN MOUTH AND THROAT WITH OR WITHOUT PRESENCE OF ULCERS  *URINARY PROBLEMS  *BOWEL PROBLEMS  UNUSUAL RASH Items with * indicate a potential emergency and should be followed up as soon as possible.  Feel free to call the clinic should you have any questions or concerns. The clinic phone number is [phone].  Please show the CHEMO ALERT CARD at check-in to the Emergency Department and triage nurse.   

## 2017-09-05 DIAGNOSIS — C7802 Secondary malignant neoplasm of left lung: Secondary | ICD-10-CM | POA: Diagnosis not present

## 2017-09-05 DIAGNOSIS — C7801 Secondary malignant neoplasm of right lung: Secondary | ICD-10-CM | POA: Diagnosis not present

## 2017-09-05 DIAGNOSIS — I481 Persistent atrial fibrillation: Secondary | ICD-10-CM | POA: Diagnosis not present

## 2017-09-05 DIAGNOSIS — C186 Malignant neoplasm of descending colon: Secondary | ICD-10-CM | POA: Diagnosis not present

## 2017-09-05 DIAGNOSIS — I10 Essential (primary) hypertension: Secondary | ICD-10-CM | POA: Diagnosis not present

## 2017-09-05 DIAGNOSIS — C787 Secondary malignant neoplasm of liver and intrahepatic bile duct: Secondary | ICD-10-CM | POA: Diagnosis not present

## 2017-09-10 ENCOUNTER — Other Ambulatory Visit: Payer: Self-pay | Admitting: *Deleted

## 2017-09-10 DIAGNOSIS — C7801 Secondary malignant neoplasm of right lung: Secondary | ICD-10-CM | POA: Diagnosis not present

## 2017-09-10 DIAGNOSIS — I481 Persistent atrial fibrillation: Secondary | ICD-10-CM | POA: Diagnosis not present

## 2017-09-10 DIAGNOSIS — C186 Malignant neoplasm of descending colon: Secondary | ICD-10-CM | POA: Diagnosis not present

## 2017-09-10 DIAGNOSIS — I10 Essential (primary) hypertension: Secondary | ICD-10-CM | POA: Diagnosis not present

## 2017-09-10 DIAGNOSIS — C787 Secondary malignant neoplasm of liver and intrahepatic bile duct: Secondary | ICD-10-CM | POA: Diagnosis not present

## 2017-09-10 DIAGNOSIS — C7802 Secondary malignant neoplasm of left lung: Secondary | ICD-10-CM | POA: Diagnosis not present

## 2017-09-11 DIAGNOSIS — C787 Secondary malignant neoplasm of liver and intrahepatic bile duct: Secondary | ICD-10-CM | POA: Diagnosis not present

## 2017-09-11 DIAGNOSIS — C7801 Secondary malignant neoplasm of right lung: Secondary | ICD-10-CM | POA: Diagnosis not present

## 2017-09-11 DIAGNOSIS — I10 Essential (primary) hypertension: Secondary | ICD-10-CM | POA: Diagnosis not present

## 2017-09-11 DIAGNOSIS — I481 Persistent atrial fibrillation: Secondary | ICD-10-CM | POA: Diagnosis not present

## 2017-09-11 DIAGNOSIS — C7802 Secondary malignant neoplasm of left lung: Secondary | ICD-10-CM | POA: Diagnosis not present

## 2017-09-11 DIAGNOSIS — C186 Malignant neoplasm of descending colon: Secondary | ICD-10-CM | POA: Diagnosis not present

## 2017-09-11 MED ORDER — FUROSEMIDE 40 MG PO TABS: 40.0000 mg | ORAL_TABLET | Freq: Every day | ORAL | 0 refills | Status: DC | PRN

## 2017-09-11 MED ORDER — LOSARTAN POTASSIUM 100 MG PO TABS: 100.0000 mg | ORAL_TABLET | Freq: Every day | ORAL | 0 refills | Status: DC

## 2017-09-16 ENCOUNTER — Telehealth: Payer: Self-pay | Admitting: Cardiovascular Disease

## 2017-09-16 MED ORDER — LOSARTAN POTASSIUM 100 MG PO TABS: 100.0000 mg | ORAL_TABLET | Freq: Every day | ORAL | 0 refills | Status: DC

## 2017-09-16 NOTE — Telephone Encounter (Signed)
New message    *STAT* If patient is at the pharmacy, call can be transferred to refill team.   1. Which medications need to be refilled? (please list name of each medication and dose if known) Losartan 100mg  and Lasix 40mg   2. Which pharmacy/location (including street and city if local pharmacy) is medication to be sent to?  [place], [place] [employer] [phone] ([phone] (Fax)   3. Do they need a 30 day or 90 day supply?   Pt's daughter says that they keep getting sent to [facility] which is the wrong pharmacy and they are suppose to go to [facility] mail service please call

## 2017-09-17 DIAGNOSIS — I481 Persistent atrial fibrillation: Secondary | ICD-10-CM | POA: Diagnosis not present

## 2017-09-17 DIAGNOSIS — C7802 Secondary malignant neoplasm of left lung: Secondary | ICD-10-CM | POA: Diagnosis not present

## 2017-09-17 DIAGNOSIS — C787 Secondary malignant neoplasm of liver and intrahepatic bile duct: Secondary | ICD-10-CM | POA: Diagnosis not present

## 2017-09-17 DIAGNOSIS — C7801 Secondary malignant neoplasm of right lung: Secondary | ICD-10-CM | POA: Diagnosis not present

## 2017-09-17 DIAGNOSIS — C186 Malignant neoplasm of descending colon: Secondary | ICD-10-CM | POA: Diagnosis not present

## 2017-09-17 DIAGNOSIS — I10 Essential (primary) hypertension: Secondary | ICD-10-CM | POA: Diagnosis not present

## 2017-09-17 NOTE — Progress Notes (Deleted)
Mary [name]  [phone] [phone]  Clinic Follow up Note   Patient Care Team: Moshe Cipro, MD as PCP - General (Internal Medicine) Alexis Frock, MD as Consulting Physician (Urology) Michael Boston, MD as Consulting Physician (General Surgery) Toma Deiters, MD as Referring Physician (Gastroenterology) 09/18/2017  CHIEF COMPLAINT: Follow up metastatic sigmoid adenocarcinoma   SUMMARY OF ONCOLOGIC HISTORY: Oncology History   Cancer Staging Cancer of left colon [facility]) Staging form: Colon and Rectum, AJCC 8th Edition - Clinical stage from 09/04/2016: Stage IVB (cTX, cNX, pM1b) - Signed by Truitt Merle, MD on 11/12/2016       Cancer of sigmoid colon ([place])   09/02/2016 Procedure    Colonoscopy Diverticulosis (scattered). Pt has about 10 cm structure starting at 20 cm with abnormal mucosa. Biopsies taken.       09/02/2016 Pathology Results    MODERATELY DIFFERENTIATED ADENOCARCINOMA OF COLON AT 20 CM      09/04/2016 Initial Diagnosis    Cancer of left colon ([place])      09/06/2016 Imaging    CT CAP w Contrast 1. No acute abdominopelvic process.  2. Heterogenous hypodense area involving the medial edge of the left kidney with multiple small soft tissue like stranding involving the left pararenal space. Findings are suspicious for malignancy of the left kidney with small pararenal metastases. MRI abdomen with contrast should be considered to further evaluate.  3. Vague hypodensity involving the lateral segment of the liver. This can also be reevaluated with MRI.  4. Partially visualized 5 mm posterior right lower lobe nodular density. CT chest without contrast follow up is recommended.  5. Complex multilobular left ovarian lesion may reflect a cluster of cysts.      10/10/2016 Procedure    Operative Report by Dr. Tresa Moore on 10/18/16 Procedure: 1) Cystoscopy, left retrograde pyelogram interpretation. 2) Left diagnostic uteroscopy. 3) Insertion of  left uteral stent; 5 x 24 Polaris with tether. Findings: 1) Unremarkable bladder. 2) Unremarkable left retrograde pyelogram. 3) No evidence of intraluminal urothelial neoplasm whatsoever with insepction of the left kidney and ureter times several. 4) Successful placement of left ureteral stent, proximal in renal pelvis and distal in urinary bladder.      10/15/2016 Imaging    MRI abdomen pelvis 1. There are 2 enhancing hepatic lesions highly worrisome for metastatic disease, likely metastatic colon cancer. 2. The lesion of concern in the anterior suprahilar lip of the left kidney is also enhancing, worrisome for neoplasm. This has a somewhat atypical appearance for renal cell carcinoma and could reflect a metastasis as well. 3. New left-sided hydroureter with delayed contrast excretion consistent with distal ureteral obstruction. Specific etiology uncertain. 4. Complex cystic and solid left adnexal lesion could reflect cystic neoplasm, although does not appear aggressive or appear to reflect the cause of the distal left ureteral obstruction. 5. Sigmoid colon wall thickening could be related to reported newly diagnosed colon cancer.      11/06/2016 PET scan    PET 11/06/2016 IMPRESSION: 1. Hypermetabolic pulmonary and hepatic lesions, most consistent with metastatic colon cancer, with the hypermetabolic primary located in the sigmoid colon. 2. Left renal and left adnexal lesions, better seen and evaluated on 10/15/2016. 3. Persistent moderate left hydronephrosis. 4. Aortic atherosclerosis (ICD10-170.0).      11/11/2016 Pathology Results    Liver, needle/core biopsy, left lobe - METASTATIC ADENOCARCINOMA. The histologic features are consistent with a primary gastrointestinal adenocarcinoma.      05/19/2017 Imaging    CT Abdomen Pelvis,  05/19/2017 IMPRESSION: 1. Worsening pulmonary and hepatic metastatic disease. Primary sigmoid lesion is stable to minimally enlarged. 2. Stable  to slight enlargement of a left perinephric mass, likely a metastasis. Renal cell carcinoma is not excluded. Associated pelvocaliectasis versus mild hydronephrosis. 3. Borderline left periaortic lymph nodes, slightly enlarged. 4. Cholelithiasis. 5.  Aortic atherosclerosis (ICD10-170.0). 6. Cystic and solid lesion in the left adnexa, as on prior exams, likely ovarian in origin.      06/06/2017 -  Chemotherapy    IV antibody Vectibix every [redacted] weeks along with oral chemo Xeloda for 1 week on and 1 week off starting 06/06/17.   For cycle 2 she had '2000mg'$  Xeloda for 10 days and then 10 days off. Return to 1 week on and 1 week off with starting with Cycle 3. Held after 07/24/17 due to hospitalization for SBO. Restarted Xeloda and Vectibix on 09/04/17      08/07/2017 - 08/16/2017 Hospital Admission    Admit date: 08/07/17 Admission diagnosis: Bowel Obstruction  Additional comments: She was admitted to the hospital on 08/07/17 after [facility] due to a bowel obstruction.  She had a sigmoid colon stent placement on 08/11/17. Bowel obstruction resolved.  CT scan in hospital showed great partial response to chemotherapy. She will continue chemotherapy given good overall tolerance.        08/07/2017 Imaging    CT AP W Contrast 08/07/17 IMPRESSION: Low colonic obstruction secondary to a rectosigmoid stricture at the site of the patient's prior colonic mass, which is no longer evident on CT. Mild upstream colonic wall thickening raises the possibility of superimposed colitis.  Improving hepatic metastases, as above. Left perirenal metastasis, decreased. Visualized pulmonary metastases at the lung bases are grossly unchanged.  3.5 cm mixed cystic/solid left ovarian mass is mildly decreased.       08/11/2017 Procedure    By Dr. Watt Climes  Flexible Sigmoidoscopy 08/11/17 IMPRESSION:   - Preparation of the colon was fair. - Likely malignant partially obstructing  tumor in the recto-sigmoid colon versus necrotic scarring. Injected with contrast proximal to obstruction to confirm proper position. Prosthesis placed. - Stool in the rectum. - No specimens collected.      CURRENT THERAPY:   IV antibody Vectibix every [redacted] weeks along with oral chemo Xeloda for 1 week on and 1 week off starting 06/06/17. For cycle 2 she had '2000mg'$  Xeloda for 10 days and then 10 days off. Return to 1 week on and 1 week off with starting with Cycle 3. Held after 07/24/17 due to hospitalization for SBO. Restarted Xeloda and Vectibix on 09/04/17  INTERVAL HISTORY: Ms.Baack returns today for follow-up as scheduled prior to next cycle of Xeloda and vectibix.  She reports her drenching night sweats are becoming more frequent and more intense, keeping her up at night so she has more daytime tiredness. Denies fever or chills. Eating and drinking well, good appetite; no nausea, vomiting, constipation, diarrhea, or mucositis. Denies abdominal pain. She takes percocet rarely but does not like the way tylenol makes her feel, causes her to feel "geared up." Legs began to appear swollen so she has taken lasix recently. No calf pain. Hands and feet are in good condition, no redness, cracks, peeling, or pain; continues to apply lotion as needed. Her rash has resolved. She began Xeloda this morning, took 4 tabs.   REVIEW OF SYSTEMS:   Constitutional: Denies fevers, chills or abnormal weight loss Eyes: Denies blurriness of vision Ears, nose, mouth, throat, and  face: Denies mucositis or sore throat Respiratory: Denies cough, dyspnea or wheezes Cardiovascular: Denies palpitation, chest discomfort or lower extremity swelling Gastrointestinal:  Denies nausea, heartburn or change in bowel habits Skin: Denies abnormal skin rashes Lymphatics: Denies new lymphadenopathy or easy bruising Neurological:Denies numbness, tingling or new weaknesses Behavioral/Psych: Mood is stable, no new changes  All other  systems were reviewed with the patient and are negative.  MEDICAL HISTORY:  Past Medical History:  Diagnosis Date  . Aneurysm of ascending aorta (H[facility]) 12/03/2016   3.7 cm by echo 10/2016  . Colon cancer [facility])    dx via biospy from colonoscopy-- malignant ---  currently having work-up done w/ dr gross (general surgeon) and coordinate surgery w/ urologsit for renal tumor  . Essential hypertension 10/31/2016  . GERD (gastroesophageal reflux disease)   . Glaucoma   . Hiatal hernia   . HOH (hard of hearing)    bilateral even w/ hearing aids  . Hyperlipidemia   . Kidney tumor    left side  . Legally blind in right eye, as defined in [place]    due to macular degeneration  . Macular degeneration   . Nausea    intermittant due to colon mass  . Ovarian cyst   . Persistent atrial fibrillation ([place]) 10/31/2016   on ASA only  . PONV (postoperative nausea and vomiting)   . Wears glasses   . Wears hearing aid    bilateral    SURGICAL HISTORY: Past Surgical History:  Procedure Laterality Date  . BREAST CYST EXCISION  1990   benign  . COLONOSCOPY  09/04/2016  . CYSTOSCOPY WITH RETROGRADE PYELOGRAM, URETEROSCOPY AND STENT PLACEMENT Left 10/10/2016   Procedure: CYSTOSCOPY WITH RETROGRADE PYELOGRAM, DIAGNOSTIC URETEROSCOPY  AND STENT PLACEMENT;  Surgeon: Alexis Frock, MD;  Location: [facility];  Service: Urology;  Laterality: Left;  . ESOPHAGOGASTRODUODENOSCOPY  11/07/2014  . FLEXIBLE SIGMOIDOSCOPY N/A 08/11/2017   Procedure: FLEXIBLE SIGMOIDOSCOPY;  Surgeon: Clarene Essex, MD;  Location: [facility];  Service: Endoscopy;  Laterality: N/A;  With Fluoroscopy for colonic stent placement for colonic stricture  . NASAL SINUS SURGERY    . TONSILLECTOMY      I have reviewed the social history and family history with the patient and they are unchanged from previous note.  ALLERGIES:  is allergic to benazepril and bactrim [sulfamethoxazole-trimethoprim].  MEDICATIONS:  Current  Outpatient Medications  Medication Sig Dispense Refill  . amLODipine (NORVASC) 5 MG tablet Take 1 tablet (5 mg total) by mouth daily. 90 tablet 1  . capecitabine (XELODA) 500 MG tablet Take 4 tablets (2,000 mg total) by mouth 2 (two) times daily after a meal. Take for 7 days then off 7 days 112 tablet 2  . clindamycin (CLINDAGEL) 1 % gel Apply topically 2 (two) times daily. 60 g 3  . diltiazem (CARDIZEM CD) 240 MG 24 hr capsule Take 1 capsule (240 mg total) by mouth daily. 90 capsule 1  . diphenhydrAMINE (BENADRYL) 25 MG tablet Take 25 mg by mouth as needed.     Marland Kitchen escitalopram (LEXAPRO) 10 MG tablet Take 10 mg by mouth daily.    . fexofenadine (ALLEGRA) 180 MG tablet Take 180 mg by mouth daily.    Marland Kitchen latanoprost (XALATAN) 0.005 % ophthalmic solution Place 1 drop into both eyes at bedtime.    Marland Kitchen losartan (COZAAR) 100 MG tablet Take 1 tablet (100 mg total) by mouth daily. 90 tablet 0  . metoprolol tartrate (LOPRESSOR) 25 MG tablet 1/2 TABLET BY MOUTH  AS NEEDED FOR PALPITATIONS 30 tablet 1  . Multiple Vitamins-Minerals (VITEYES AREDS ADVANCED) CAPS Take 1 capsule by mouth daily.    Marland Kitchen omeprazole (PRILOSEC) 40 MG capsule Take 40 mg by mouth daily.    . ondansetron (ZOFRAN ODT) 4 MG disintegrating tablet Take 1 tablet (4 mg total) by mouth every 8 (eight) hours as needed for nausea or vomiting. 30 tablet 0  . Polyethyl Glycol-Propyl Glycol (SYSTANE) 0.4-0.3 % SOLN Apply 1 drop to eye daily as needed (dry eyes).     . polyethylene glycol (MIRALAX / GLYCOLAX) packet Take 17 g by mouth daily as needed for moderate constipation. 30 each 0  . Potassium Chloride ER 20 MEQ TBCR Take 20 mEq by mouth daily. 30 tablet 1  . furosemide (LASIX) 40 MG tablet Take 1 tablet (40 mg total) by mouth daily as needed for edema. 30 tablet 0  . oxyCODONE (OXY IR/ROXICODONE) 5 MG immediate release tablet Take 1 tablet (5 mg total) by mouth every 6 (six) hours as needed for moderate pain or severe pain. 40 tablet 0   Current  Facility-Administered Medications  Medication Dose Route Frequency Provider Last Rate Last Dose  . 0.9 %  sodium chloride infusion  250 mL Intravenous Continuous Skeet Latch, MD      . sodium chloride flush (NS) 0.9 % injection 3 mL  3 mL Intravenous Q12H Skeet Latch, MD      . sodium chloride flush (NS) 0.9 % injection 3 mL  3 mL Intravenous PRN Skeet Latch, MD        PHYSICAL EXAMINATION: ECOG PERFORMANCE STATUS: {CHL ONC ECOG IZ:1245809983}  Vitals:   09/18/17 0827  BP: 127/68  Pulse: 82  Resp: 20  Temp: 98.1 F (36.7 C)  SpO2: 97%   Filed Weights   09/18/17 0827  Weight: 173 lb 4.8 oz (78.6 kg)    GENERAL:alert, no distress and comfortable SKIN: skin color, texture, turgor are normal, no rashes or significant lesions EYES: normal, Conjunctiva are pink and non-injected, sclera clear OROPHARYNX:no exudate, no erythema and lips, buccal mucosa, and tongue normal  NECK: supple, thyroid normal size, non-tender, without nodularity LYMPH:  no palpable lymphadenopathy in the cervical, axillary or inguinal LUNGS: clear to auscultation and percussion with normal breathing effort HEART: regular rate & rhythm and no murmurs and no lower extremity edema ABDOMEN:abdomen soft, non-tender and normal bowel sounds Musculoskeletal:no cyanosis of digits and no clubbing  NEURO: alert & oriented x 3 with fluent speech, no focal motor/sensory deficits  LABORATORY DATA:  I have reviewed the data as listed CBC Latest Ref Rng & Units 09/18/2017 09/04/2017 08/14/2017  WBC 3.9 - 10.3 K/uL 10.0 8.5 10.9(H)  Hemoglobin 11.6 - 15.9 g/dL 12.9 11.7 12.0  Hematocrit 34.8 - 46.6 % 39.4 36.5 36.0  Platelets 145 - 400 K/uL 364 366 317     CMP Latest Ref Rng & Units 09/18/2017 09/04/2017 08/15/2017  Glucose 70 - 140 mg/dL 82 73 105(H)  BUN 7 - 26 mg/dL '14 11 6  '$ Creatinine 0.60 - 1.10 mg/dL 0.79 0.82 0.72  Sodium 136 - 145 mmol/L 139 139 139  Potassium 3.5 - 5.1 mmol/L 3.5 4.3 3.9  Chloride  98 - 109 mmol/L 105 107 111  CO2 22 - 29 mmol/L '25 23 23  '$ Calcium 8.4 - 10.4 mg/dL 9.1 9.0 8.4(L)  Total Protein 6.4 - 8.3 g/dL 7.8 7.0 -  Total Bilirubin 0.2 - 1.2 mg/dL 0.5 0.3 -  Alkaline Phos 40 - 150 U/L 126  129 -  AST 5 - 34 U/L 16 19 -  ALT 0 - 55 U/L 21 19 -   PATHOLOGY: [facility] 11/11/16   Diagnosis 11/11/16 Liver, needle/core biopsy, left lobe - METASTATIC ADENOCARCINOMA. - SEE COMMENT. Microscopic Comment The histologic features are consistent with a primary gastrointestinal adenocarcinoma. Dr. Vicente Males has reviewed the case and concurs with this interpretation. There is likely sufficient tumor present for additional studies, if requested. Enid Cutter MD Pathologist, Electronic Signature (Case signed 11/12/2016)  Diagnosis 09/04/2016 MODERATELY DIFFERENTIATED ADENOCARCINOMA OF COLON AT 20 CM Tumor Site: Colon at 20 cm Tumor Type: Adenocarcinoma Tumore Size: Unknown  Grade: Moderately differentiated    RADIOGRAPHIC STUDIES: I have personally reviewed the radiological images as listed and agreed with the findings in the report. No results found.   ASSESSMENT & PLAN: Anberlin is a 84 y.o. female   1. Adenocarcinoma of left colon, sigmoid colon, TxNxM1 with liver and lung mets, stage IV, KRAS/NRS wild type, MSI-stable  No problem-specific Assessment & Plan notes found for this encounter.   No orders of the defined types were placed in this encounter.  All questions were answered. The patient knows to call the clinic with any problems, questions or concerns. No barriers to learning was detected. I spent {CHL ONC TIME VISIT - NHAFB:[id number]} counseling the patient face to face. The total time spent in the appointment was {CHL ONC TIME VISIT - NVBTY:6060045997} and more than 50% was on counseling and review of test results     Alla Feeling, NP 09/18/17

## 2017-09-18 ENCOUNTER — Other Ambulatory Visit: Payer: Self-pay | Admitting: Cardiovascular Disease

## 2017-09-18 ENCOUNTER — Encounter: Payer: Self-pay | Admitting: Nurse Practitioner

## 2017-09-18 ENCOUNTER — Telehealth: Payer: Self-pay | Admitting: Cardiovascular Disease

## 2017-09-18 ENCOUNTER — Inpatient Hospital Stay (HOSPITAL_BASED_OUTPATIENT_CLINIC_OR_DEPARTMENT_OTHER): Payer: Medicare Other | Admitting: Nurse Practitioner

## 2017-09-18 ENCOUNTER — Inpatient Hospital Stay: Payer: Medicare Other | Attending: Hematology

## 2017-09-18 ENCOUNTER — Inpatient Hospital Stay: Payer: Medicare Other

## 2017-09-18 VITALS — BP 127/68 | HR 82 | Temp 98.1°F | Resp 20 | Ht 66.0 in | Wt 173.3 lb

## 2017-09-18 DIAGNOSIS — C787 Secondary malignant neoplasm of liver and intrahepatic bile duct: Secondary | ICD-10-CM

## 2017-09-18 DIAGNOSIS — R109 Unspecified abdominal pain: Secondary | ICD-10-CM

## 2017-09-18 DIAGNOSIS — Z66 Do not resuscitate: Secondary | ICD-10-CM | POA: Diagnosis not present

## 2017-09-18 DIAGNOSIS — D649 Anemia, unspecified: Secondary | ICD-10-CM | POA: Diagnosis not present

## 2017-09-18 DIAGNOSIS — R61 Generalized hyperhidrosis: Secondary | ICD-10-CM | POA: Insufficient documentation

## 2017-09-18 DIAGNOSIS — Z7189 Other specified counseling: Secondary | ICD-10-CM | POA: Insufficient documentation

## 2017-09-18 DIAGNOSIS — Z5112 Encounter for antineoplastic immunotherapy: Secondary | ICD-10-CM | POA: Diagnosis not present

## 2017-09-18 DIAGNOSIS — C186 Malignant neoplasm of descending colon: Secondary | ICD-10-CM

## 2017-09-18 DIAGNOSIS — C7800 Secondary malignant neoplasm of unspecified lung: Secondary | ICD-10-CM

## 2017-09-18 DIAGNOSIS — C187 Malignant neoplasm of sigmoid colon: Secondary | ICD-10-CM | POA: Diagnosis not present

## 2017-09-18 DIAGNOSIS — I10 Essential (primary) hypertension: Secondary | ICD-10-CM | POA: Insufficient documentation

## 2017-09-18 LAB — COMPREHENSIVE METABOLIC PANEL
ALT: 21 U/L (ref 0–55)
ANION GAP: 9 (ref 3–11)
AST: 16 U/L (ref 5–34)
Albumin: 3.6 g/dL (ref 3.5–5.0)
Alkaline Phosphatase: 126 U/L (ref 40–150)
BUN: 14 mg/dL (ref 7–26)
CHLORIDE: 105 mmol/L (ref 98–109)
CO2: 25 mmol/L (ref 22–29)
Calcium: 9.1 mg/dL (ref 8.4–10.4)
Creatinine, Ser: 0.79 mg/dL (ref 0.60–1.10)
GFR calc non Af Amer: >60 mL/min (ref >60)
Glucose, Bld: 82 mg/dL (ref 70–140)
Potassium: 3.5 mmol/L (ref 3.5–5.1)
SODIUM: 139 mmol/L (ref 136–145)
Total Bilirubin: 0.5 mg/dL (ref 0.2–1.2)
Total Protein: 7.8 g/dL (ref 6.4–8.3)

## 2017-09-18 LAB — CBC WITH DIFFERENTIAL/PLATELET
BASOS PCT: 0 %
Basophils Absolute: 0 10*3/uL (ref 0.0–0.1)
Eosinophils Absolute: 0.2 10*3/uL (ref 0.0–0.5)
Eosinophils Relative: 2 %
HCT: 39.4 % (ref 34.8–46.6)
HEMOGLOBIN: 12.9 g/dL (ref 11.6–15.9)
LYMPHS ABS: 2.1 10*3/uL (ref 0.9–3.3)
Lymphocytes Relative: 21 %
MCH: 31.2 pg (ref 25.1–34.0)
MCHC: 32.7 g/dL (ref 31.5–36.0)
MCV: 95.2 fL (ref 79.5–101.0)
MONOS PCT: 12 %
Monocytes Absolute: 1.2 10*3/uL — ABNORMAL HIGH (ref 0.1–0.9)
NEUTROS PCT: 65 %
Neutro Abs: 6.4 10*3/uL (ref 1.5–6.5)
Platelets: 364 10*3/uL (ref 145–400)
RBC: 4.14 MIL/uL (ref 3.70–5.45)
RDW: 18.1 % — ABNORMAL HIGH (ref 11.2–14.5)
WBC: 10 10*3/uL (ref 3.9–10.3)

## 2017-09-18 LAB — MAGNESIUM: MAGNESIUM: 2 mg/dL (ref 1.5–2.5)

## 2017-09-18 MED ORDER — OXYCODONE HCL 5 MG PO TABS: 5.0000 mg | ORAL_TABLET | Freq: Four times a day (QID) | ORAL | 0 refills | Status: DC | PRN

## 2017-09-18 MED ORDER — SODIUM CHLORIDE 0.9 % IV SOLN
Freq: Once | INTRAVENOUS | Status: AC
  Administered 2017-09-18: 10:00:00 via INTRAVENOUS

## 2017-09-18 MED ORDER — SODIUM CHLORIDE 0.9 % IV SOLN
6.0000 mg/kg | Freq: Once | INTRAVENOUS | Status: AC
  Administered 2017-09-18: 500 mg via INTRAVENOUS
  Filled 2017-09-18: qty 20

## 2017-09-18 MED ORDER — LOSARTAN POTASSIUM 100 MG PO TABS: 100.0000 mg | ORAL_TABLET | Freq: Every day | ORAL | 0 refills | Status: DC

## 2017-09-18 NOTE — Patient Instructions (Signed)
[facility] Discharge Instructions for Patients Receiving Chemotherapy  Today you received the following chemotherapy agents: Vectibix.  To help prevent nausea and vomiting after your treatment, we encourage you to take your nausea medication as directed.   If you develop nausea and vomiting that is not controlled by your nausea medication, call the clinic.   BELOW ARE SYMPTOMS THAT SHOULD BE REPORTED IMMEDIATELY:  *FEVER GREATER THAN 100.5 F  *CHILLS WITH OR WITHOUT FEVER  NAUSEA AND VOMITING THAT IS NOT CONTROLLED WITH YOUR NAUSEA MEDICATION  *UNUSUAL SHORTNESS OF BREATH  *UNUSUAL BRUISING OR BLEEDING  TENDERNESS IN MOUTH AND THROAT WITH OR WITHOUT PRESENCE OF ULCERS  *URINARY PROBLEMS  *BOWEL PROBLEMS  UNUSUAL RASH Items with * indicate a potential emergency and should be followed up as soon as possible.  Feel free to call the clinic should you have any questions or concerns. The clinic phone number is [phone].  Please show the CHEMO ALERT CARD at check-in to the Emergency Department and triage nurse.   

## 2017-09-18 NOTE — Telephone Encounter (Signed)
Mary [name] patient

## 2017-09-18 NOTE — Telephone Encounter (Signed)
REFILL 

## 2017-09-18 NOTE — Telephone Encounter (Signed)
New Message    *STAT* If patient is at the pharmacy, call can be transferred to refill team.   1. Which medications need to be refilled? (please list name of each medication and dose if known) Losartan 100mg  and Lasix 40mg     2. Which pharmacy/location (including street and city if local pharmacy) is medication to be sent to? [facility]  3. Do they need a 30 day or 90 day supply? 90 day  Patient will be out in two days.  Please call [place] to confirm sent.

## 2017-09-18 NOTE — Progress Notes (Signed)
Mary [name]  [phone] [phone]  Clinic Follow up Note   Patient Care Team: Moshe Cipro, MD as PCP - General (Internal Medicine) Alexis Frock, MD as Consulting Physician (Urology) Michael Boston, MD as Consulting Physician (General Surgery) Toma Deiters, MD as Referring Physician (Gastroenterology) 09/18/2017  CHIEF COMPLAINT: Follow up metastatic sigmoid adenocarcinoma   SUMMARY OF ONCOLOGIC HISTORY: Oncology History   Cancer Staging Cancer of left colon [facility]) Staging form: Colon and Rectum, AJCC 8th Edition - Clinical stage from 09/04/2016: Stage IVB (cTX, cNX, pM1b) - Signed by Truitt Merle, MD on 11/12/2016       Cancer of sigmoid colon ([place])   09/02/2016 Procedure    Colonoscopy Diverticulosis (scattered). Pt has about 10 cm structure starting at 20 cm with abnormal mucosa. Biopsies taken.       09/02/2016 Pathology Results    MODERATELY DIFFERENTIATED ADENOCARCINOMA OF COLON AT 20 CM      09/04/2016 Initial Diagnosis    Cancer of left colon ([place])      09/06/2016 Imaging    CT CAP w Contrast 1. No acute abdominopelvic process.  2. Heterogenous hypodense area involving the medial edge of the left kidney with multiple small soft tissue like stranding involving the left pararenal space. Findings are suspicious for malignancy of the left kidney with small pararenal metastases. MRI abdomen with contrast should be considered to further evaluate.  3. Vague hypodensity involving the lateral segment of the liver. This can also be reevaluated with MRI.  4. Partially visualized 5 mm posterior right lower lobe nodular density. CT chest without contrast follow up is recommended.  5. Complex multilobular left ovarian lesion may reflect a cluster of cysts.      10/10/2016 Procedure    Operative Report by Dr. Tresa Moore on 10/18/16 Procedure: 1) Cystoscopy, left retrograde pyelogram interpretation. 2) Left diagnostic uteroscopy. 3) Insertion of  left uteral stent; 5 x 24 Polaris with tether. Findings: 1) Unremarkable bladder. 2) Unremarkable left retrograde pyelogram. 3) No evidence of intraluminal urothelial neoplasm whatsoever with insepction of the left kidney and ureter times several. 4) Successful placement of left ureteral stent, proximal in renal pelvis and distal in urinary bladder.      10/15/2016 Imaging    MRI abdomen pelvis 1. There are 2 enhancing hepatic lesions highly worrisome for metastatic disease, likely metastatic colon cancer. 2. The lesion of concern in the anterior suprahilar lip of the left kidney is also enhancing, worrisome for neoplasm. This has a somewhat atypical appearance for renal cell carcinoma and could reflect a metastasis as well. 3. New left-sided hydroureter with delayed contrast excretion consistent with distal ureteral obstruction. Specific etiology uncertain. 4. Complex cystic and solid left adnexal lesion could reflect cystic neoplasm, although does not appear aggressive or appear to reflect the cause of the distal left ureteral obstruction. 5. Sigmoid colon wall thickening could be related to reported newly diagnosed colon cancer.      11/06/2016 PET scan    PET 11/06/2016 IMPRESSION: 1. Hypermetabolic pulmonary and hepatic lesions, most consistent with metastatic colon cancer, with the hypermetabolic primary located in the sigmoid colon. 2. Left renal and left adnexal lesions, better seen and evaluated on 10/15/2016. 3. Persistent moderate left hydronephrosis. 4. Aortic atherosclerosis (ICD10-170.0).      11/11/2016 Pathology Results    Liver, needle/core biopsy, left lobe - METASTATIC ADENOCARCINOMA. The histologic features are consistent with a primary gastrointestinal adenocarcinoma.      05/19/2017 Imaging    CT Abdomen Pelvis,  05/19/2017 IMPRESSION: 1. Worsening pulmonary and hepatic metastatic disease. Primary sigmoid lesion is stable to minimally enlarged. 2. Stable  to slight enlargement of a left perinephric mass, likely a metastasis. Renal cell carcinoma is not excluded. Associated pelvocaliectasis versus mild hydronephrosis. 3. Borderline left periaortic lymph nodes, slightly enlarged. 4. Cholelithiasis. 5.  Aortic atherosclerosis (ICD10-170.0). 6. Cystic and solid lesion in the left adnexa, as on prior exams, likely ovarian in origin.      06/06/2017 -  Chemotherapy    IV antibody Vectibix every [redacted] weeks along with oral chemo Xeloda for 1 week on and 1 week off starting 06/06/17.   For cycle 2 she had 206m Xeloda for 10 days and then 10 days off. Return to 1 week on and 1 week off with starting with Cycle 3. Held after 07/24/17 due to hospitalization for SBO. Restarted Xeloda and Vectibix on 09/04/17      08/07/2017 - 08/16/2017 Hospital Admission    Admit date: 08/07/17 Admission diagnosis: Bowel Obstruction  Additional comments: She was admitted to the hospital on 08/07/17 after [facility] due to a bowel obstruction.  She had a sigmoid colon stent placement on 08/11/17. Bowel obstruction resolved.  CT scan in hospital showed great partial response to chemotherapy. She will continue chemotherapy given good overall tolerance.        08/07/2017 Imaging    CT AP W Contrast 08/07/17 IMPRESSION: Low colonic obstruction secondary to a rectosigmoid stricture at the site of the patient's prior colonic mass, which is no longer evident on CT. Mild upstream colonic wall thickening raises the possibility of superimposed colitis.  Improving hepatic metastases, as above. Left perirenal metastasis, decreased. Visualized pulmonary metastases at the lung bases are grossly unchanged.  3.5 cm mixed cystic/solid left ovarian mass is mildly decreased.       08/11/2017 Procedure    By Dr. MWatt Climes Flexible Sigmoidoscopy 08/11/17 IMPRESSION:   - Preparation of the colon was fair. - Likely malignant partially obstructing  tumor in the recto-sigmoid colon versus necrotic scarring. Injected with contrast proximal to obstruction to confirm proper position. Prosthesis placed. - Stool in the rectum. - No specimens collected.      CURRENT THERAPY:   IV antibody Vectibix every [redacted] weeks along with oral chemo Xeloda for 1 week on and 1 week off starting 06/06/17. For cycle 2 she had 20033mXeloda for 10 days and then 10 days off. Return to 1 week on and 1 week off with starting with Cycle 3. Held after 07/24/17 due to hospitalization for SBO. Restarted Xeloda and Vectibix on 09/04/17  INTERVAL HISTORY: Ms.Aiken returns today for follow-up as scheduled prior to next cycle of Xeloda and vectibix.  She reports her drenching night sweats are becoming more frequent and more intense, keeping her up at night so she has more daytime tiredness. Denies fever or chills. Eating and drinking well, good appetite; no nausea, vomiting, constipation, diarrhea, or mucositis. Denies abdominal pain. She takes percocet rarely but does not like the way tylenol makes her feel, causes her to feel "geared up." Legs began to appear swollen so she has taken lasix recently. No calf pain. Hands and feet are in good condition, no redness, cracks, peeling, or pain; continues to apply lotion as needed. Her rash has resolved. She began Xeloda this morning, took 4 tabs.   REVIEW OF SYSTEMS:   Constitutional: Denies fevers, chills or abnormal weight loss (+) drenching night sweats  Eyes: Denies blurriness of vision  Ears, nose, mouth, throat, and face: Denies mucositis or sore throat Respiratory: Denies cough, dyspnea or wheezes Cardiovascular: Denies palpitation, chest discomfort or lower extremity swelling Gastrointestinal:  Denies nausea, vomiting, constipation, diarrhea, heartburn or change in bowel habits Skin: Denies abnormal skin rashes (+) no redness, cracks, peeling, or sensitivity to hands or feet  Lymphatics: Denies new lymphadenopathy or easy  bruising Neurological:Denies numbness, tingling or new weaknesses Behavioral/Psych: Mood is stable, no new changes  All other systems were reviewed with the patient and are negative.  MEDICAL HISTORY:  Past Medical History:  Diagnosis Date  . Aneurysm of ascending aorta ([facility]) 12/03/2016   3.7 cm by echo 10/2016  . Colon cancer [facility])    dx via biospy from colonoscopy-- malignant ---  currently having work-up done w/ dr gross (general surgeon) and coordinate surgery w/ urologsit for renal tumor  . Essential hypertension 10/31/2016  . GERD (gastroesophageal reflux disease)   . Glaucoma   . Hiatal hernia   . HOH (hard of hearing)    bilateral even w/ hearing aids  . Hyperlipidemia   . Kidney tumor    left side  . Legally blind in right eye, as defined in [place]    due to macular degeneration  . Macular degeneration   . Nausea    intermittant due to colon mass  . Ovarian cyst   . Persistent atrial fibrillation ([place]) 10/31/2016   on ASA only  . PONV (postoperative nausea and vomiting)   . Wears glasses   . Wears hearing aid    bilateral    SURGICAL HISTORY: Past Surgical History:  Procedure Laterality Date  . BREAST CYST EXCISION  1990   benign  . COLONOSCOPY  09/04/2016  . CYSTOSCOPY WITH RETROGRADE PYELOGRAM, URETEROSCOPY AND STENT PLACEMENT Left 10/10/2016   Procedure: CYSTOSCOPY WITH RETROGRADE PYELOGRAM, DIAGNOSTIC URETEROSCOPY  AND STENT PLACEMENT;  Surgeon: Alexis Frock, MD;  Location: [facility];  Service: Urology;  Laterality: Left;  . ESOPHAGOGASTRODUODENOSCOPY  11/07/2014  . FLEXIBLE SIGMOIDOSCOPY N/A 08/11/2017   Procedure: FLEXIBLE SIGMOIDOSCOPY;  Surgeon: Clarene Essex, MD;  Location: [facility];  Service: Endoscopy;  Laterality: N/A;  With Fluoroscopy for colonic stent placement for colonic stricture  . NASAL SINUS SURGERY    . TONSILLECTOMY      I have reviewed the social history and family history with the patient and they are unchanged from  previous note.  ALLERGIES:  is allergic to benazepril and bactrim [sulfamethoxazole-trimethoprim].  MEDICATIONS:  Current Outpatient Medications  Medication Sig Dispense Refill  . amLODipine (NORVASC) 5 MG tablet Take 1 tablet (5 mg total) by mouth daily. 90 tablet 1  . capecitabine (XELODA) 500 MG tablet Take 4 tablets (2,000 mg total) by mouth 2 (two) times daily after a meal. Take for 7 days then off 7 days 112 tablet 2  . clindamycin (CLINDAGEL) 1 % gel Apply topically 2 (two) times daily. 60 g 3  . diltiazem (CARDIZEM CD) 240 MG 24 hr capsule Take 1 capsule (240 mg total) by mouth daily. 90 capsule 1  . diphenhydrAMINE (BENADRYL) 25 MG tablet Take 25 mg by mouth as needed.     Marland Kitchen escitalopram (LEXAPRO) 10 MG tablet Take 10 mg by mouth daily.    . fexofenadine (ALLEGRA) 180 MG tablet Take 180 mg by mouth daily.    . furosemide (LASIX) 40 MG tablet Take 1 tablet (40 mg total) by mouth daily as needed for edema. 30 tablet 0  . latanoprost (XALATAN) 0.005 %  ophthalmic solution Place 1 drop into both eyes at bedtime.    . metoprolol tartrate (LOPRESSOR) 25 MG tablet 1/2 TABLET BY MOUTH AS NEEDED FOR PALPITATIONS 30 tablet 1  . Multiple Vitamins-Minerals (VITEYES AREDS ADVANCED) CAPS Take 1 capsule by mouth daily.    Marland Kitchen omeprazole (PRILOSEC) 40 MG capsule Take 40 mg by mouth daily.    . ondansetron (ZOFRAN ODT) 4 MG disintegrating tablet Take 1 tablet (4 mg total) by mouth every 8 (eight) hours as needed for nausea or vomiting. 30 tablet 0  . Polyethyl Glycol-Propyl Glycol (SYSTANE) 0.4-0.3 % SOLN Apply 1 drop to eye daily as needed (dry eyes).     . polyethylene glycol (MIRALAX / GLYCOLAX) packet Take 17 g by mouth daily as needed for moderate constipation. 30 each 0  . Potassium Chloride ER 20 MEQ TBCR Take 20 mEq by mouth daily. 30 tablet 1  . losartan (COZAAR) 100 MG tablet Take 1 tablet (100 mg total) by mouth daily. 90 tablet 0  . oxyCODONE (OXY IR/ROXICODONE) 5 MG immediate release tablet  Take 1 tablet (5 mg total) by mouth every 6 (six) hours as needed for moderate pain or severe pain. 40 tablet 0   Current Facility-Administered Medications  Medication Dose Route Frequency Provider Last Rate Last Dose  . 0.9 %  sodium chloride infusion  250 mL Intravenous Continuous Skeet Latch, MD      . sodium chloride flush (NS) 0.9 % injection 3 mL  3 mL Intravenous Q12H Skeet Latch, MD      . sodium chloride flush (NS) 0.9 % injection 3 mL  3 mL Intravenous PRN Skeet Latch, MD        PHYSICAL EXAMINATION: ECOG PERFORMANCE STATUS: 1 - Symptomatic but completely ambulatory  Vitals:   09/18/17 0827  BP: 127/68  Pulse: 82  Resp: 20  Temp: 98.1 F (36.7 C)  SpO2: 97%   Filed Weights   09/18/17 0827  Weight: 173 lb 4.8 oz (78.6 kg)    GENERAL:alert, no distress and comfortable SKIN: skin color, texture, turgor are normal, no rashes or significant lesions. No palmar-plantar erythema EYES: normal, Conjunctiva are pink and non-injected, sclera clear OROPHARYNX:no exudate, no erythema and lips, buccal mucosa, and tongue normal  NECK: supple, thyroid normal size, non-tender, without nodularity LYMPH:  no palpable cervical, supraclavicular, axillary, or inguinal lymphadenopathy  LUNGS: clear to auscultation bilaterally with normal breathing effort HEART: regular rate & rhythm and no murmurs and no lower extremity edema ABDOMEN:abdomen soft, non-tender and normal bowel sounds. No hepatomegaly Musculoskeletal:no cyanosis of digits and no clubbing  NEURO: alert & oriented x 3 with fluent speech, no focal motor/sensory deficits  LABORATORY DATA:  I have reviewed the data as listed CBC Latest Ref Rng & Units 09/18/2017 09/04/2017 08/14/2017  WBC 3.9 - 10.3 K/uL 10.0 8.5 10.9(H)  Hemoglobin 11.6 - 15.9 g/dL 12.9 11.7 12.0  Hematocrit 34.8 - 46.6 % 39.4 36.5 36.0  Platelets 145 - 400 K/uL 364 366 317     CMP Latest Ref Rng & Units 09/18/2017 09/04/2017 08/15/2017  Glucose  70 - 140 mg/dL 82 73 105(H)  BUN 7 - 26 mg/dL _0 Creatinine 0.60 - 1.10 mg/dL 0.79 0.82 0.72  Sodium 136 - 145 mmol/L 139 139 139  Potassium 3.5 - 5.1 mmol/L 3.5 4.3 3.9  Chloride 98 - 109 mmol/L 105 107 111  CO2 22 - 29 mmol/L _1 Calcium 8.4 - 10.4 mg/dL 9.1 9.0 8.4(L)  Total  Protein 6.4 - 8.3 g/dL 7.8 7.0 -  Total Bilirubin 0.2 - 1.2 mg/dL 0.5 0.3 -  Alkaline Phos 40 - 150 U/L 126 129 -  AST 5 - 34 U/L 16 19 -  ALT 0 - 55 U/L 21 19 -   PATHOLOGY: [facility] 11/11/16   Diagnosis 11/11/16 Liver, needle/core biopsy, left lobe - METASTATIC ADENOCARCINOMA. - SEE COMMENT. Microscopic Comment The histologic features are consistent with a primary gastrointestinal adenocarcinoma. Dr. Vicente Males has reviewed the case and concurs with this interpretation. There is likely sufficient tumor present for additional studies, if requested. Enid Cutter MD Pathologist, Electronic Signature (Case signed 11/12/2016)  Diagnosis 09/04/2016 MODERATELY DIFFERENTIATED ADENOCARCINOMA OF COLON AT 20 CM Tumor Site: Colon at 20 cm Tumor Type: Adenocarcinoma Tumore Size: Unknown  Grade: Moderately differentiated    RADIOGRAPHIC STUDIES: I have personally reviewed the radiological images as listed and agreed with the findings in the report. No results found.   ASSESSMENT & PLAN: Sorah is a 84 y.o. female   1. Adenocarcinoma of left colon, sigmoid colon, TxNxM1 with liver and lung mets, stage IV, KRAS/NRS wild type, MSI-stable  -Ms. Ponder appears stable today.  Completed another cycle of Xeloda and vectibix.  She is tolerating well overall, no skin or GI toxicity.  Labs reviewed, CBC and CMP unremarkable.  She will begin another cycle of Xeloda 2000 mg twice daily 1 week on/1 week off and IV vectibix today. Return for f/u and next cycle in 2 week.   2. Abdominal pain -She denies abdominal pain today, has been minimal overall. She does not like the Tylenol component of Percocet, I  discontinued today and prescribed oxycodone 5 mg IR. I reviewed the dose of oxycodone is smaller than the dose in Percocet, for her to keep a log of pain medication requirement and pain score so we can adjust appropriately.  She agrees.  3. Night sweats -Have been increasing in frequency and intensity recently. Could be related to malignancy. She does not check her temperature doing episodes, I encouraged her to do so. We will monitor closely  4.  Palmar erythema and dryness, facial acne type rash, secondary to chemo -She has no evident skin toxicities or rash today  5.  Goals of care -The patient elects DNR status -will provide her an order for her to have at home at next f/u  PLAN: -Labs reviewed, proceed with next cycle vectibix and Xeloda 2000 mg BID 1 week on and 1 week off today -Prescribed oxycodone; discontinue percocet All questions were answered. The patient knows to call the clinic with any problems, questions or concerns. No barriers to learning was detected.    Alla Feeling, NP 09/18/17

## 2017-09-18 NOTE — Telephone Encounter (Signed)
Please review for refill. Thanks!  

## 2017-09-19 ENCOUNTER — Telehealth: Payer: Self-pay | Admitting: Hematology

## 2017-09-19 ENCOUNTER — Encounter: Payer: Self-pay | Admitting: Hematology

## 2017-09-19 NOTE — Progress Notes (Signed)
Pt is approved for the $400 [facility] grant.  °

## 2017-09-19 NOTE — Telephone Encounter (Signed)
Appointments scheduled and AVS/Calendar mailed to patient per 2/7 los

## 2017-09-24 DIAGNOSIS — C787 Secondary malignant neoplasm of liver and intrahepatic bile duct: Secondary | ICD-10-CM | POA: Diagnosis not present

## 2017-09-24 DIAGNOSIS — C7801 Secondary malignant neoplasm of right lung: Secondary | ICD-10-CM | POA: Diagnosis not present

## 2017-09-24 DIAGNOSIS — I10 Essential (primary) hypertension: Secondary | ICD-10-CM | POA: Diagnosis not present

## 2017-09-24 DIAGNOSIS — C7802 Secondary malignant neoplasm of left lung: Secondary | ICD-10-CM | POA: Diagnosis not present

## 2017-09-24 DIAGNOSIS — I481 Persistent atrial fibrillation: Secondary | ICD-10-CM | POA: Diagnosis not present

## 2017-09-24 DIAGNOSIS — C186 Malignant neoplasm of descending colon: Secondary | ICD-10-CM | POA: Diagnosis not present

## 2017-09-25 ENCOUNTER — Other Ambulatory Visit: Payer: Self-pay

## 2017-09-29 ENCOUNTER — Telehealth: Payer: Self-pay | Admitting: Cardiovascular Disease

## 2017-09-29 MED ORDER — FUROSEMIDE 40 MG PO TABS: 40.0000 mg | ORAL_TABLET | Freq: Every day | ORAL | 1 refills | Status: DC

## 2017-09-29 MED ORDER — LOSARTAN POTASSIUM 100 MG PO TABS: 100.0000 mg | ORAL_TABLET | Freq: Every day | ORAL | 0 refills | Status: DC

## 2017-09-29 MED FILL — CAPECITABINE 500 MG TABLET: 500 | 28 days supply | Qty: 112 | Fill #2

## 2017-09-29 NOTE — Telephone Encounter (Signed)
Scripts sent to [facility] via epic appears originals may have been sent to [facility] in [place]

## 2017-09-29 NOTE — Telephone Encounter (Signed)
Mary [name]( daugther in [employer]) is calling because [facility] is stating that they did not received the refill request for the Losartan and Furosemide .  They are asking that we fax it [phone]. Please call Mary [name] if you have any questions   Thanks

## 2017-10-02 ENCOUNTER — Inpatient Hospital Stay (HOSPITAL_BASED_OUTPATIENT_CLINIC_OR_DEPARTMENT_OTHER): Payer: Medicare Other | Admitting: Nurse Practitioner

## 2017-10-02 ENCOUNTER — Inpatient Hospital Stay: Payer: Medicare Other

## 2017-10-02 ENCOUNTER — Telehealth: Payer: Self-pay | Admitting: Nurse Practitioner

## 2017-10-02 ENCOUNTER — Encounter: Payer: Self-pay | Admitting: Nurse Practitioner

## 2017-10-02 VITALS — BP 118/66 | HR 71 | Temp 97.7°F | Resp 20 | Ht 66.0 in | Wt 176.6 lb

## 2017-10-02 DIAGNOSIS — I10 Essential (primary) hypertension: Secondary | ICD-10-CM

## 2017-10-02 DIAGNOSIS — C186 Malignant neoplasm of descending colon: Secondary | ICD-10-CM

## 2017-10-02 DIAGNOSIS — Z7189 Other specified counseling: Secondary | ICD-10-CM | POA: Diagnosis not present

## 2017-10-02 DIAGNOSIS — Z66 Do not resuscitate: Secondary | ICD-10-CM | POA: Diagnosis not present

## 2017-10-02 DIAGNOSIS — C7800 Secondary malignant neoplasm of unspecified lung: Secondary | ICD-10-CM | POA: Diagnosis not present

## 2017-10-02 DIAGNOSIS — D649 Anemia, unspecified: Secondary | ICD-10-CM | POA: Diagnosis not present

## 2017-10-02 DIAGNOSIS — R61 Generalized hyperhidrosis: Secondary | ICD-10-CM

## 2017-10-02 DIAGNOSIS — C787 Secondary malignant neoplasm of liver and intrahepatic bile duct: Secondary | ICD-10-CM

## 2017-10-02 DIAGNOSIS — Z5112 Encounter for antineoplastic immunotherapy: Secondary | ICD-10-CM | POA: Diagnosis not present

## 2017-10-02 DIAGNOSIS — C187 Malignant neoplasm of sigmoid colon: Secondary | ICD-10-CM | POA: Diagnosis not present

## 2017-10-02 DIAGNOSIS — I4891 Unspecified atrial fibrillation: Secondary | ICD-10-CM | POA: Diagnosis not present

## 2017-10-02 DIAGNOSIS — E876 Hypokalemia: Secondary | ICD-10-CM | POA: Diagnosis not present

## 2017-10-02 DIAGNOSIS — R600 Localized edema: Secondary | ICD-10-CM

## 2017-10-02 LAB — COMPREHENSIVE METABOLIC PANEL
ALK PHOS: 103 U/L (ref 40–150)
ALT: 11 U/L (ref 0–55)
ANION GAP: 10 (ref 3–11)
AST: 15 U/L (ref 5–34)
Albumin: 3.3 g/dL — ABNORMAL LOW (ref 3.5–5.0)
BILIRUBIN TOTAL: 0.5 mg/dL (ref 0.2–1.2)
BUN: 10 mg/dL (ref 7–26)
CALCIUM: 9.3 mg/dL (ref 8.4–10.4)
CO2: 24 mmol/L (ref 22–29)
CREATININE: 0.85 mg/dL (ref 0.60–1.10)
Chloride: 105 mmol/L (ref 98–109)
GFR calc non Af Amer: >60 mL/min (ref >60)
GLUCOSE: 71 mg/dL (ref 70–140)
Potassium: 3.4 mmol/L — ABNORMAL LOW (ref 3.5–5.1)
Sodium: 139 mmol/L (ref 136–145)
TOTAL PROTEIN: 7.2 g/dL (ref 6.4–8.3)

## 2017-10-02 LAB — CBC WITH DIFFERENTIAL/PLATELET
Basophils Absolute: 0.1 10*3/uL (ref 0.0–0.1)
Basophils Relative: 1 %
Eosinophils Absolute: 0.4 10*3/uL (ref 0.0–0.5)
Eosinophils Relative: 4 %
HEMATOCRIT: 35.8 % (ref 34.8–46.6)
Hemoglobin: 11.8 g/dL (ref 11.6–15.9)
LYMPHS ABS: 1.9 10*3/uL (ref 0.9–3.3)
LYMPHS PCT: 21 %
MCH: 31.4 pg (ref 25.1–34.0)
MCHC: 33 g/dL (ref 31.5–36.0)
MCV: 95.2 fL (ref 79.5–101.0)
MONOS PCT: 18 %
Monocytes Absolute: 1.6 10*3/uL — ABNORMAL HIGH (ref 0.1–0.9)
NEUTROS ABS: 4.9 10*3/uL (ref 1.5–6.5)
Neutrophils Relative %: 56 %
Platelets: 324 10*3/uL (ref 145–400)
RBC: 3.77 MIL/uL (ref 3.70–5.45)
RDW: 18.2 % — ABNORMAL HIGH (ref 11.2–14.5)
WBC: 8.8 10*3/uL (ref 3.9–10.3)

## 2017-10-02 LAB — MAGNESIUM: Magnesium: 2 mg/dL (ref 1.5–2.5)

## 2017-10-02 LAB — CEA (IN HOUSE-CHCC): CEA (CHCC-In House): 2.66 ng/mL (ref 0.00–5.00)

## 2017-10-02 MED ORDER — SODIUM CHLORIDE 0.9 % IV SOLN
6.0000 mg/kg | Freq: Once | INTRAVENOUS | Status: AC
  Administered 2017-10-02: 500 mg via INTRAVENOUS
  Filled 2017-10-02: qty 20

## 2017-10-02 MED ORDER — SODIUM CHLORIDE 0.9 % IV SOLN
Freq: Once | INTRAVENOUS | Status: AC
  Administered 2017-10-02: 10:00:00 via INTRAVENOUS

## 2017-10-02 NOTE — Telephone Encounter (Signed)
Scheduled appt per 2/21 scheduled appt per los - Gave patient AVS and calender per los.

## 2017-10-02 NOTE — Progress Notes (Signed)
[place]  [phone] [phone]  Clinic Follow up Note   Patient Care Team: Moshe Cipro, MD as PCP - General (Internal Medicine) Alexis Frock, MD as Consulting Physician (Urology) Michael Boston, MD as Consulting Physician (General Surgery) Toma Deiters, MD as Referring Physician (Gastroenterology) 10/02/2017  CHIEF COMPLAINT: Follow-up metastatic sigmoid adenocarcinoma  SUMMARY OF ONCOLOGIC HISTORY: Oncology History   Cancer Staging Cancer of left colon [facility]) Staging form: Colon and Rectum, AJCC 8th Edition - Clinical stage from 09/04/2016: Stage IVB (cTX, cNX, pM1b) - Signed by Truitt Merle, MD on 11/12/2016       Cancer of sigmoid colon ([place])   09/02/2016 Procedure    Colonoscopy Diverticulosis (scattered). Pt has about 10 cm structure starting at 20 cm with abnormal mucosa. Biopsies taken.       09/02/2016 Pathology Results    MODERATELY DIFFERENTIATED ADENOCARCINOMA OF COLON AT 20 CM      09/04/2016 Initial Diagnosis    Cancer of left colon ([place])      09/06/2016 Imaging    CT CAP w Contrast 1. No acute abdominopelvic process.  2. Heterogenous hypodense area involving the medial edge of the left kidney with multiple small soft tissue like stranding involving the left pararenal space. Findings are suspicious for malignancy of the left kidney with small pararenal metastases. MRI abdomen with contrast should be considered to further evaluate.  3. Vague hypodensity involving the lateral segment of the liver. This can also be reevaluated with MRI.  4. Partially visualized 5 mm posterior right lower lobe nodular density. CT chest without contrast follow up is recommended.  5. Complex multilobular left ovarian lesion may reflect a cluster of cysts.      10/10/2016 Procedure    Operative Report by Dr. Tresa Moore on 10/18/16 Procedure: 1) Cystoscopy, left retrograde pyelogram interpretation. 2) Left diagnostic uteroscopy. 3) Insertion of  left uteral stent; 5 x 24 Polaris with tether. Findings: 1) Unremarkable bladder. 2) Unremarkable left retrograde pyelogram. 3) No evidence of intraluminal urothelial neoplasm whatsoever with insepction of the left kidney and ureter times several. 4) Successful placement of left ureteral stent, proximal in renal pelvis and distal in urinary bladder.      10/15/2016 Imaging    MRI abdomen pelvis 1. There are 2 enhancing hepatic lesions highly worrisome for metastatic disease, likely metastatic colon cancer. 2. The lesion of concern in the anterior suprahilar lip of the left kidney is also enhancing, worrisome for neoplasm. This has a somewhat atypical appearance for renal cell carcinoma and could reflect a metastasis as well. 3. New left-sided hydroureter with delayed contrast excretion consistent with distal ureteral obstruction. Specific etiology uncertain. 4. Complex cystic and solid left adnexal lesion could reflect cystic neoplasm, although does not appear aggressive or appear to reflect the cause of the distal left ureteral obstruction. 5. Sigmoid colon wall thickening could be related to reported newly diagnosed colon cancer.      11/06/2016 PET scan    PET 11/06/2016 IMPRESSION: 1. Hypermetabolic pulmonary and hepatic lesions, most consistent with metastatic colon cancer, with the hypermetabolic primary located in the sigmoid colon. 2. Left renal and left adnexal lesions, better seen and evaluated on 10/15/2016. 3. Persistent moderate left hydronephrosis. 4. Aortic atherosclerosis (ICD10-170.0).      11/11/2016 Pathology Results    Liver, needle/core biopsy, left lobe - METASTATIC ADENOCARCINOMA. The histologic features are consistent with a primary gastrointestinal adenocarcinoma.      05/19/2017 Imaging    CT Abdomen Pelvis, 05/19/2017 IMPRESSION:  1. Worsening pulmonary and hepatic metastatic disease. Primary sigmoid lesion is stable to minimally enlarged. 2. Stable  to slight enlargement of a left perinephric mass, likely a metastasis. Renal cell carcinoma is not excluded. Associated pelvocaliectasis versus mild hydronephrosis. 3. Borderline left periaortic lymph nodes, slightly enlarged. 4. Cholelithiasis. 5.  Aortic atherosclerosis (ICD10-170.0). 6. Cystic and solid lesion in the left adnexa, as on prior exams, likely ovarian in origin.      06/06/2017 -  Chemotherapy    IV antibody Vectibix every [redacted] weeks along with oral chemo Xeloda for 1 week on and 1 week off starting 06/06/17.   For cycle 2 she had 2063m Xeloda for 10 days and then 10 days off. Return to 1 week on and 1 week off with starting with Cycle 3. Held after 07/24/17 due to hospitalization for SBO. Restarted Xeloda and Vectibix on 09/04/17      08/07/2017 - 08/16/2017 Hospital Admission    Admit date: 08/07/17 Admission diagnosis: Bowel Obstruction  Additional comments: She was admitted to the hospital on 08/07/17 after [facility] due to a bowel obstruction.  She had a sigmoid colon stent placement on 08/11/17. Bowel obstruction resolved.  CT scan in hospital showed great partial response to chemotherapy. She will continue chemotherapy given good overall tolerance.        08/07/2017 Imaging    CT AP W Contrast 08/07/17 IMPRESSION: Low colonic obstruction secondary to a rectosigmoid stricture at the site of the patient's prior colonic mass, which is no longer evident on CT. Mild upstream colonic wall thickening raises the possibility of superimposed colitis.  Improving hepatic metastases, as above. Left perirenal metastasis, decreased. Visualized pulmonary metastases at the lung bases are grossly unchanged.  3.5 cm mixed cystic/solid left ovarian mass is mildly decreased.       08/11/2017 Procedure    By Dr. MWatt Climes Flexible Sigmoidoscopy 08/11/17 IMPRESSION:   - Preparation of the colon was fair. - Likely malignant partially obstructing  tumor in the recto-sigmoid colon versus necrotic scarring. Injected with contrast proximal to obstruction to confirm proper position. Prosthesis placed. - Stool in the rectum. - No specimens collected.      CURRENT THERAPY: IV antibody Vectibix every [redacted] weeks along with oral chemo Xeloda for 1 week on and 1 week off starting 06/06/17. For cycle 2 she had 20075mXeloda for 10 days and then 10 days off. Return to 1 week on and 1 week off with starting with Cycle 3. Held after 07/24/17 due to hospitalization for SBO.Restarted Xeloda and Vectibix on 09/04/17  INTERVAL HISTORY: Ms. SaDouglasseturns for follow up as scheduled prior to next cycle vectibix and Xeloda.  She feels well, eating and drinking well, denies mucositis.  She did not pick up oxycodone at last office visit, but denies abdominal or other pain and has not required medication. Has 1 regular BM daily. Night sweats are improving overall.  Notes bilateral swelling in her lower extremities, taking Lasix 1 tablet daily.  The swelling improves overnight and when legs are elevated.  Denies calf pain. Hands are occasionally stiff with intermittent mild tingling; not limiting function. There is one small crack to the fingertip of right hand, no redness or pain. Applies cetaphil lotion and soaks her fingernails. She began next cycle Xeldoa this morning with 2000 mg BID 1 week on/1 week off.   REVIEW OF SYSTEMS:   Constitutional: Denies fevers, chills or abnormal weight loss (+) night sweats, improving Eyes: Denies blurriness  of vision Ears, nose, mouth, throat, and face: Denies mucositis or sore throat Respiratory: Denies cough, dyspnea or wheezes Cardiovascular: Denies palpitation, chest discomfort (+) lower extremity swelling Gastrointestinal:  Denies nausea, vomiting, constipation, diarrhea, heartburn or change in bowel habits (+) 1 regular BM daily Skin: Denies abnormal skin rashes (+) small crack to right hand fingertip Lymphatics:  Denies new lymphadenopathy or easy bruising Neurological:Denies numbness or new weaknesses (+) intermittent mild tingling to fingertips Behavioral/Psych: Mood is stable, no new changes  All other systems were reviewed with the patient and are negative.  MEDICAL HISTORY:  Past Medical History:  Diagnosis Date  . Aneurysm of ascending aorta (H[facility]) 12/03/2016   3.7 cm by echo 10/2016  . Colon cancer [facility])    dx via biospy from colonoscopy-- malignant ---  currently having work-up done w/ dr gross (general surgeon) and coordinate surgery w/ urologsit for renal tumor  . Essential hypertension 10/31/2016  . GERD (gastroesophageal reflux disease)   . Glaucoma   . Hiatal hernia   . HOH (hard of hearing)    bilateral even w/ hearing aids  . Hyperlipidemia   . Kidney tumor    left side  . Legally blind in right eye, as defined in [place]    due to macular degeneration  . Macular degeneration   . Nausea    intermittant due to colon mass  . Ovarian cyst   . Persistent atrial fibrillation ([place]) 10/31/2016   on ASA only  . PONV (postoperative nausea and vomiting)   . Wears glasses   . Wears hearing aid    bilateral    SURGICAL HISTORY: Past Surgical History:  Procedure Laterality Date  . BREAST CYST EXCISION  1990   benign  . COLONOSCOPY  09/04/2016  . CYSTOSCOPY WITH RETROGRADE PYELOGRAM, URETEROSCOPY AND STENT PLACEMENT Left 10/10/2016   Procedure: CYSTOSCOPY WITH RETROGRADE PYELOGRAM, DIAGNOSTIC URETEROSCOPY  AND STENT PLACEMENT;  Surgeon: Alexis Frock, MD;  Location: [facility];  Service: Urology;  Laterality: Left;  . ESOPHAGOGASTRODUODENOSCOPY  11/07/2014  . FLEXIBLE SIGMOIDOSCOPY N/A 08/11/2017   Procedure: FLEXIBLE SIGMOIDOSCOPY;  Surgeon: Clarene Essex, MD;  Location: [facility];  Service: Endoscopy;  Laterality: N/A;  With Fluoroscopy for colonic stent placement for colonic stricture  . NASAL SINUS SURGERY    . TONSILLECTOMY      I have reviewed the social  history and family history with the patient and they are unchanged from previous note.  ALLERGIES:  is allergic to benazepril and bactrim [sulfamethoxazole-trimethoprim].  MEDICATIONS:  Current Outpatient Medications  Medication Sig Dispense Refill  . amLODipine (NORVASC) 5 MG tablet Take 1 tablet (5 mg total) by mouth daily. 90 tablet 1  . capecitabine (XELODA) 500 MG tablet Take 4 tablets (2,000 mg total) by mouth 2 (two) times daily after a meal. Take for 7 days then off 7 days 112 tablet 2  . clindamycin (CLINDAGEL) 1 % gel Apply topically 2 (two) times daily. 60 g 3  . diltiazem (CARDIZEM CD) 240 MG 24 hr capsule Take 1 capsule (240 mg total) by mouth daily. 90 capsule 1  . diphenhydrAMINE (BENADRYL) 25 MG tablet Take 25 mg by mouth as needed.     Marland Kitchen escitalopram (LEXAPRO) 10 MG tablet Take 10 mg by mouth daily.    . fexofenadine (ALLEGRA) 180 MG tablet Take 180 mg by mouth daily.    . furosemide (LASIX) 40 MG tablet Take 1 tablet (40 mg total) by mouth daily. 90 tablet 1  . latanoprost (  XALATAN) 0.005 % ophthalmic solution Place 1 drop into both eyes at bedtime.    Marland Kitchen losartan (COZAAR) 100 MG tablet Take 1 tablet (100 mg total) by mouth daily. 90 tablet 0  . metoprolol tartrate (LOPRESSOR) 25 MG tablet 1/2 TABLET BY MOUTH AS NEEDED FOR PALPITATIONS 30 tablet 1  . Multiple Vitamins-Minerals (VITEYES AREDS ADVANCED) CAPS Take 1 capsule by mouth daily.    Marland Kitchen omeprazole (PRILOSEC) 40 MG capsule Take 40 mg by mouth daily.    . ondansetron (ZOFRAN ODT) 4 MG disintegrating tablet Take 1 tablet (4 mg total) by mouth every 8 (eight) hours as needed for nausea or vomiting. 30 tablet 0  . Polyethyl Glycol-Propyl Glycol (SYSTANE) 0.4-0.3 % SOLN Apply 1 drop to eye daily as needed (dry eyes).     . polyethylene glycol (MIRALAX / GLYCOLAX) packet Take 17 g by mouth daily as needed for moderate constipation. 30 each 0  . Potassium Chloride ER 20 MEQ TBCR Take 20 mEq by mouth daily. 30 tablet 1  .  oxyCODONE (OXY IR/ROXICODONE) 5 MG immediate release tablet Take 1 tablet (5 mg total) by mouth every 6 (six) hours as needed for moderate pain or severe pain. (Patient not taking: Reported on 10/02/2017) 40 tablet 0   Current Facility-Administered Medications  Medication Dose Route Frequency Provider Last Rate Last Dose  . 0.9 %  sodium chloride infusion  250 mL Intravenous Continuous Skeet Latch, MD      . sodium chloride flush (NS) 0.9 % injection 3 mL  3 mL Intravenous Q12H Skeet Latch, MD      . sodium chloride flush (NS) 0.9 % injection 3 mL  3 mL Intravenous PRN Skeet Latch, MD        PHYSICAL EXAMINATION: ECOG PERFORMANCE STATUS: 1 - Symptomatic but completely ambulatory  Vitals:   10/02/17 0818  BP: 118/66  Pulse: 71  Resp: 20  Temp: 97.7 F (36.5 C)  SpO2: 99%   Filed Weights   10/02/17 0818  Weight: 176 lb 9.6 oz (80.1 kg)    GENERAL:alert, no distress and comfortable SKIN: skin color, texture, turgor are normal, no rashes or significant lesions. No palmar-plantar erythema, one small crack to right hand fingertip.  Dryness to tip of nose EYES: normal, Conjunctiva are pink and non-injected, sclera clear OROPHARYNX:no exudate, no erythema and lips, buccal mucosa, and tongue normal  NECK: supple, thyroid normal size, non-tender, without nodularity LYMPH:  no palpable cervical, supraclavicular, axillary, or inguinal lymphadenopathy LUNGS: clear to auscultation bilaterally with normal breathing effort HEART: regular rate & rhythm and no murmurs (+) 1+ bilateral lower extremity edema. No calf tenderness ABDOMEN:abdomen soft, non-tender and normal bowel sounds. No hepatomegaly  Musculoskeletal:no cyanosis of digits and no clubbing  NEURO: alert & oriented x 3 with fluent speech, no focal motor/sensory deficits  LABORATORY DATA:  I have reviewed the data as listed CBC Latest Ref Rng & Units 10/02/2017 09/18/2017 09/04/2017  WBC 3.9 - 10.3 K/uL 8.8 10.0 8.5    Hemoglobin 11.6 - 15.9 g/dL 11.8 12.9 11.7  Hematocrit 34.8 - 46.6 % 35.8 39.4 36.5  Platelets 145 - 400 K/uL 324 364 366     CMP Latest Ref Rng & Units 10/02/2017 09/18/2017 09/04/2017  Glucose 70 - 140 mg/dL 71 82 73  BUN 7 - 26 mg/dL 10 14 11   Creatinine 0.60 - 1.10 mg/dL 0.85 0.79 0.82  Sodium 136 - 145 mmol/L 139 139 139  Potassium 3.5 - 5.1 mmol/L 3.4(L) 3.5 4.3  Chloride  98 - 109 mmol/L 105 105 107  CO2 22 - 29 mmol/L 24 25 23   Calcium 8.4 - 10.4 mg/dL 9.3 9.1 9.0  Total Protein 6.4 - 8.3 g/dL 7.2 7.8 7.0  Total Bilirubin 0.2 - 1.2 mg/dL 0.5 0.5 0.3  Alkaline Phos 40 - 150 U/L 103 126 129  AST 5 - 34 U/L 15 16 19   ALT 0 - 55 U/L 11 21 19    PATHOLOGY: [facility] 11/11/16   Diagnosis 11/11/16 Liver, needle/core biopsy, left lobe - METASTATIC ADENOCARCINOMA. - SEE COMMENT. Microscopic Comment The histologic features are consistent with a primary gastrointestinal adenocarcinoma. Dr. Vicente Males has reviewed the case and concurs with this interpretation. There is likely sufficient tumor present for additional studies, if requested. Enid Cutter MD Pathologist, Electronic Signature (Case signed 11/12/2016)  Diagnosis 09/04/2016 MODERATELY DIFFERENTIATED ADENOCARCINOMA OF COLON AT 20 CM Tumor Site: Colon at 20 cm Tumor Type: Adenocarcinoma Tumore Size: Unknown  Grade: Moderately differentiated    RADIOGRAPHIC STUDIES: I have personally reviewed the radiological images as listed and agreed with the findings in the report. No results found.   ASSESSMENT & PLAN: Alonia is a84 y.o.female   1. Adenocarcinoma of left colon, sigmoid colon, TxNxM1 with liver and lung mets, stage IV, KRAS/NRS wild type, MSI-stable  -Mary [name] appears stable. She completed cycle 6 vectibix on 09/18/17 and another cycle of 1 week on/1 week off Xeloda on 09/25/17. She is tolerating well overall without significant toxicities. VS and weight are stable. Labs reviewed, CBC, CMP, Mg adequate for  treatment. CEA remains normal. Will obtain restaging scan in approx 1 month. Will proceed with cycle 7 vectibix and begin another cycle of Xeloda, 2000 mg BID for 7 days on then 7 days off. Return in 2 weeks for lab and f/u with next cycle.   2.  Abdominal pain  -denies abdominal pain lately. She did not like tylenol component of percocet, I previously discontinued and ordered oxycodone at 09/18/17 f/u but she did not fill. The Rx is ready at her pharmacy for pick up.   3.  Night sweats  -could be related to her malignancy; remains afebrile.  Reports night sweats are improved   4.  Erythema and dryness, facial acne type rash, secondary to chemo  -previous vectibix rash is resolved, she has some dryness on the tip of her nose.  I encouraged her to use non--perfume moisturizing lotion to hands and feet and areas of dryness.  She has one small crack on the fingertip on her right hand, I encouraged her to use lotion.  If she develops redness or sensitivity with Xeloda she can use topical hydrocortisone cream as needed.  5.  Goals of care -She elects DNR; understands the goal of therapy is palliative   6.  LE swelling  -taking one 40 mg tablet Lasix daily, I encouraged her to wear compression socks and elevate her legs while resting.  7.  Left renal mass with left hydronephrosis -status post left ureter stent placement on 10/10/2016, seen by GU Dr. Tresa Moore; 08/07/2017 CT shows left perirenal metastases have decreased  8.  Complex cyst of left ovary  -08/07/17 CT shows mild decrease in size of cyst, will monitor  9.  A. Fib  -followed by cardiology, taking baby aspirin  10.  HTN  -continue medication regimen per PCP and cardiology; BP has been well controlled lately, 118/66 today.  11.  Nutrition  -she continues to eat and drink well, weight is stable  12.  Anemia  -initially was related to blood loss from her tumor.  Anemia has been stable lately, Hgb 11.8 today  13.  SBO, S/P sigmoid stent  placement  -resolved  14.  Hypokalemia  -she is currently on losartan and Lasix, both of which can affect her potassium level.  She takes one 21 M EQ tablet daily.  I encouraged her to continue this current dose and increase potassium in her diet.  PLAN:  -Labs reviewed, proceed with vectibix and Xeloda today, 2000 mg BID x1 week on/1 week off -Return in 2 weeks for lab and f/u with next cycle  -Patient will pick up oxycodone from pharmacy, previously sent 2/7 -Continue oral K supplement 20 Meq tablet daily; increase in diet  All questions were answered. The patient knows to call the clinic with any problems, questions or concerns. No barriers to learning was detected.    Mary Feeling, NP 10/02/17

## 2017-10-02 NOTE — Telephone Encounter (Signed)
Follow up    *STAT* If patient is at the pharmacy, call can be transferred to refill team.   1. Which medications need to be refilled? (please list name of each medication and dose if known) losartan (COZAAR) 100 MG tablet  2. Which pharmacy/location (including street and city if local pharmacy) is medication to be sent to? [place], [place]  3. Do they need a 30 day or 90 day supply?

## 2017-10-02 NOTE — Patient Instructions (Signed)
[facility] Discharge Instructions for Patients Receiving Chemotherapy  Today you received the following chemotherapy agents: Vectibix.  To help prevent nausea and vomiting after your treatment, we encourage you to take your nausea medication as directed.   If you develop nausea and vomiting that is not controlled by your nausea medication, call the clinic.   BELOW ARE SYMPTOMS THAT SHOULD BE REPORTED IMMEDIATELY:  *FEVER GREATER THAN 100.5 F  *CHILLS WITH OR WITHOUT FEVER  NAUSEA AND VOMITING THAT IS NOT CONTROLLED WITH YOUR NAUSEA MEDICATION  *UNUSUAL SHORTNESS OF BREATH  *UNUSUAL BRUISING OR BLEEDING  TENDERNESS IN MOUTH AND THROAT WITH OR WITHOUT PRESENCE OF ULCERS  *URINARY PROBLEMS  *BOWEL PROBLEMS  UNUSUAL RASH Items with * indicate a potential emergency and should be followed up as soon as possible.  Feel free to call the clinic should you have any questions or concerns. The clinic phone number is [phone].  Please show the CHEMO ALERT CARD at check-in to the Emergency Department and triage nurse.   

## 2017-10-08 ENCOUNTER — Telehealth: Payer: Self-pay | Admitting: Cardiovascular Disease

## 2017-10-08 NOTE — Telephone Encounter (Signed)
Spoke with daughter-in-law and patient has not received her Losartan. She does have a different date of birth listed on her insurance DOB listed as [id number]. Was able to phone in Rx for Losartan 100 mg daily #90 with 3 refills. Advised daughter-in-law this as taken care of

## 2017-10-08 NOTE — Telephone Encounter (Signed)
New Message      *STAT* If patient is at the pharmacy, call can be transferred to refill team.   1. Which medications need to be refilled? (please list name of each medication and dose if known)  losartan (COZAAR) 100 MG tablet Take 1 tablet (100 mg total) by mouth daily.     2. Which pharmacy/location (including street and city if local pharmacy) is medication to be sent to? [facility]   3. Do they need a 30 day or 90 day supply? [place]

## 2017-10-16 ENCOUNTER — Inpatient Hospital Stay: Payer: Medicare Other | Attending: Hematology

## 2017-10-16 ENCOUNTER — Encounter: Payer: Self-pay | Admitting: Nurse Practitioner

## 2017-10-16 ENCOUNTER — Inpatient Hospital Stay (HOSPITAL_BASED_OUTPATIENT_CLINIC_OR_DEPARTMENT_OTHER): Payer: Medicare Other | Admitting: Nurse Practitioner

## 2017-10-16 ENCOUNTER — Inpatient Hospital Stay: Payer: Medicare Other

## 2017-10-16 VITALS — BP 134/64 | HR 70 | Temp 97.8°F | Resp 20 | Ht 66.0 in | Wt 175.7 lb

## 2017-10-16 DIAGNOSIS — D649 Anemia, unspecified: Secondary | ICD-10-CM

## 2017-10-16 DIAGNOSIS — M7989 Other specified soft tissue disorders: Secondary | ICD-10-CM | POA: Diagnosis not present

## 2017-10-16 DIAGNOSIS — C787 Secondary malignant neoplasm of liver and intrahepatic bile duct: Secondary | ICD-10-CM

## 2017-10-16 DIAGNOSIS — C186 Malignant neoplasm of descending colon: Secondary | ICD-10-CM

## 2017-10-16 DIAGNOSIS — Z5112 Encounter for antineoplastic immunotherapy: Secondary | ICD-10-CM | POA: Diagnosis not present

## 2017-10-16 DIAGNOSIS — I4891 Unspecified atrial fibrillation: Secondary | ICD-10-CM | POA: Diagnosis not present

## 2017-10-16 DIAGNOSIS — I10 Essential (primary) hypertension: Secondary | ICD-10-CM | POA: Diagnosis not present

## 2017-10-16 DIAGNOSIS — C187 Malignant neoplasm of sigmoid colon: Secondary | ICD-10-CM | POA: Insufficient documentation

## 2017-10-16 DIAGNOSIS — R61 Generalized hyperhidrosis: Secondary | ICD-10-CM

## 2017-10-16 DIAGNOSIS — C7800 Secondary malignant neoplasm of unspecified lung: Secondary | ICD-10-CM

## 2017-10-16 LAB — CBC WITH DIFFERENTIAL/PLATELET
Basophils Absolute: 0.1 10*3/uL (ref 0.0–0.1)
Basophils Relative: 1 %
Eosinophils Absolute: 0.3 10*3/uL (ref 0.0–0.5)
Eosinophils Relative: 3 %
HEMATOCRIT: 35.6 % (ref 34.8–46.6)
HEMOGLOBIN: 11.9 g/dL (ref 11.6–15.9)
LYMPHS ABS: 1.8 10*3/uL (ref 0.9–3.3)
LYMPHS PCT: 22 %
MCH: 32.2 pg (ref 25.1–34.0)
MCHC: 33.4 g/dL (ref 31.5–36.0)
MCV: 96.4 fL (ref 79.5–101.0)
MONOS PCT: 15 %
Monocytes Absolute: 1.2 10*3/uL — ABNORMAL HIGH (ref 0.1–0.9)
NEUTROS PCT: 59 %
Neutro Abs: 4.6 10*3/uL (ref 1.5–6.5)
Platelets: 324 10*3/uL (ref 145–400)
RBC: 3.69 MIL/uL — ABNORMAL LOW (ref 3.70–5.45)
RDW: 18.4 % — ABNORMAL HIGH (ref 11.2–14.5)
WBC: 7.9 10*3/uL (ref 3.9–10.3)

## 2017-10-16 LAB — COMPREHENSIVE METABOLIC PANEL
ALK PHOS: 97 U/L (ref 40–150)
ALT: 11 U/L (ref 0–55)
ANION GAP: 8 (ref 3–11)
AST: 16 U/L (ref 5–34)
Albumin: 3.6 g/dL (ref 3.5–5.0)
BILIRUBIN TOTAL: 0.8 mg/dL (ref 0.2–1.2)
BUN: 8 mg/dL (ref 7–26)
CALCIUM: 9.6 mg/dL (ref 8.4–10.4)
CO2: 26 mmol/L (ref 22–29)
CREATININE: 0.91 mg/dL (ref 0.60–1.10)
Chloride: 105 mmol/L (ref 98–109)
GFR, EST NON AFRICAN AMERICAN: 57 mL/min — AB (ref >60)
Glucose, Bld: 83 mg/dL (ref 70–140)
Potassium: 3.8 mmol/L (ref 3.5–5.1)
Sodium: 139 mmol/L (ref 136–145)
TOTAL PROTEIN: 7.6 g/dL (ref 6.4–8.3)

## 2017-10-16 LAB — MAGNESIUM: MAGNESIUM: 2.2 mg/dL (ref 1.7–2.4)

## 2017-10-16 MED ORDER — SODIUM CHLORIDE 0.9 % IV SOLN
Freq: Once | INTRAVENOUS | Status: AC
  Administered 2017-10-16: 11:00:00 via INTRAVENOUS

## 2017-10-16 MED ORDER — PANITUMUMAB CHEMO INJECTION 100 MG/5ML
6.0000 mg/kg | Freq: Once | INTRAVENOUS | Status: AC
  Administered 2017-10-16: 500 mg via INTRAVENOUS
  Filled 2017-10-16: qty 20

## 2017-10-16 NOTE — Patient Instructions (Signed)
[facility] Discharge Instructions for Patients Receiving Chemotherapy  Today you received the following chemotherapy agents: Vectibix.  To help prevent nausea and vomiting after your treatment, we encourage you to take your nausea medication as directed.   If you develop nausea and vomiting that is not controlled by your nausea medication, call the clinic.   BELOW ARE SYMPTOMS THAT SHOULD BE REPORTED IMMEDIATELY:  *FEVER GREATER THAN 100.5 F  *CHILLS WITH OR WITHOUT FEVER  NAUSEA AND VOMITING THAT IS NOT CONTROLLED WITH YOUR NAUSEA MEDICATION  *UNUSUAL SHORTNESS OF BREATH  *UNUSUAL BRUISING OR BLEEDING  TENDERNESS IN MOUTH AND THROAT WITH OR WITHOUT PRESENCE OF ULCERS  *URINARY PROBLEMS  *BOWEL PROBLEMS  UNUSUAL RASH Items with * indicate a potential emergency and should be followed up as soon as possible.  Feel free to call the clinic should you have any questions or concerns. The clinic phone number is [phone].  Please show the CHEMO ALERT CARD at check-in to the Emergency Department and triage nurse.   

## 2017-10-16 NOTE — Progress Notes (Signed)
[place]  [phone] [phone]  Clinic Follow up Note   Patient Care Team: Moshe Cipro, MD as PCP - General (Internal Medicine) Alexis Frock, MD as Consulting Physician (Urology) Michael Boston, MD as Consulting Physician (General Surgery) Toma Deiters, MD as Referring Physician (Gastroenterology) 10/16/2017  SUMMARY OF ONCOLOGIC HISTORY: Oncology History   Cancer Staging Cancer of left colon [facility]) Staging form: Colon and Rectum, AJCC 8th Edition - Clinical stage from 09/04/2016: Stage IVB (cTX, cNX, pM1b) - Signed by Truitt Merle, MD on 11/12/2016       Cancer of sigmoid colon ([place])   09/02/2016 Procedure    Colonoscopy Diverticulosis (scattered). Pt has about 10 cm structure starting at 20 cm with abnormal mucosa. Biopsies taken.       09/02/2016 Pathology Results    MODERATELY DIFFERENTIATED ADENOCARCINOMA OF COLON AT 20 CM      09/04/2016 Initial Diagnosis    Cancer of left colon ([place])      09/06/2016 Imaging    CT CAP w Contrast 1. No acute abdominopelvic process.  2. Heterogenous hypodense area involving the medial edge of the left kidney with multiple small soft tissue like stranding involving the left pararenal space. Findings are suspicious for malignancy of the left kidney with small pararenal metastases. MRI abdomen with contrast should be considered to further evaluate.  3. Vague hypodensity involving the lateral segment of the liver. This can also be reevaluated with MRI.  4. Partially visualized 5 mm posterior right lower lobe nodular density. CT chest without contrast follow up is recommended.  5. Complex multilobular left ovarian lesion may reflect a cluster of cysts.      10/10/2016 Procedure    Operative Report by Dr. Tresa Moore on 10/18/16 Procedure: 1) Cystoscopy, left retrograde pyelogram interpretation. 2) Left diagnostic uteroscopy. 3) Insertion of left uteral stent; 5 x 24 Polaris with tether. Findings: 1)  Unremarkable bladder. 2) Unremarkable left retrograde pyelogram. 3) No evidence of intraluminal urothelial neoplasm whatsoever with insepction of the left kidney and ureter times several. 4) Successful placement of left ureteral stent, proximal in renal pelvis and distal in urinary bladder.      10/15/2016 Imaging    MRI abdomen pelvis 1. There are 2 enhancing hepatic lesions highly worrisome for metastatic disease, likely metastatic colon cancer. 2. The lesion of concern in the anterior suprahilar lip of the left kidney is also enhancing, worrisome for neoplasm. This has a somewhat atypical appearance for renal cell carcinoma and could reflect a metastasis as well. 3. New left-sided hydroureter with delayed contrast excretion consistent with distal ureteral obstruction. Specific etiology uncertain. 4. Complex cystic and solid left adnexal lesion could reflect cystic neoplasm, although does not appear aggressive or appear to reflect the cause of the distal left ureteral obstruction. 5. Sigmoid colon wall thickening could be related to reported newly diagnosed colon cancer.      11/06/2016 PET scan    PET 11/06/2016 IMPRESSION: 1. Hypermetabolic pulmonary and hepatic lesions, most consistent with metastatic colon cancer, with the hypermetabolic primary located in the sigmoid colon. 2. Left renal and left adnexal lesions, better seen and evaluated on 10/15/2016. 3. Persistent moderate left hydronephrosis. 4. Aortic atherosclerosis (ICD10-170.0).      11/11/2016 Pathology Results    Liver, needle/core biopsy, left lobe - METASTATIC ADENOCARCINOMA. The histologic features are consistent with a primary gastrointestinal adenocarcinoma.      05/19/2017 Imaging    CT Abdomen Pelvis, 05/19/2017 IMPRESSION: 1. Worsening pulmonary and hepatic metastatic disease.  Primary sigmoid lesion is stable to minimally enlarged. 2. Stable to slight enlargement of a left perinephric mass, likely  a metastasis. Renal cell carcinoma is not excluded. Associated pelvocaliectasis versus mild hydronephrosis. 3. Borderline left periaortic lymph nodes, slightly enlarged. 4. Cholelithiasis. 5.  Aortic atherosclerosis (ICD10-170.0). 6. Cystic and solid lesion in the left adnexa, as on prior exams, likely ovarian in origin.      06/06/2017 -  Chemotherapy    IV antibody Vectibix every [redacted] weeks along with oral chemo Xeloda for 1 week on and 1 week off starting 06/06/17.   For cycle 2 she had 209m Xeloda for 10 days and then 10 days off. Return to 1 week on and 1 week off with starting with Cycle 3. Held after 07/24/17 due to hospitalization for SBO. Restarted Xeloda and Vectibix on 09/04/17      08/07/2017 - 08/16/2017 Hospital Admission    Admit date: 08/07/17 Admission diagnosis: Bowel Obstruction  Additional comments: She was admitted to the hospital on 08/07/17 after [facility] due to a bowel obstruction.  She had a sigmoid colon stent placement on 08/11/17. Bowel obstruction resolved.  CT scan in hospital showed great partial response to chemotherapy. She will continue chemotherapy given good overall tolerance.        08/07/2017 Imaging    CT AP W Contrast 08/07/17 IMPRESSION: Low colonic obstruction secondary to a rectosigmoid stricture at the site of the patient's prior colonic mass, which is no longer evident on CT. Mild upstream colonic wall thickening raises the possibility of superimposed colitis.  Improving hepatic metastases, as above. Left perirenal metastasis, decreased. Visualized pulmonary metastases at the lung bases are grossly unchanged.  3.5 cm mixed cystic/solid left ovarian mass is mildly decreased.       08/11/2017 Procedure    By Dr. MWatt Climes Flexible Sigmoidoscopy 08/11/17 IMPRESSION:   - Preparation of the colon was fair. - Likely malignant partially obstructing tumor in the recto-sigmoid colon versus necrotic scarring.  Injected with contrast proximal to obstruction to confirm proper position. Prosthesis placed. - Stool in the rectum. - No specimens collected.      CURRENT THERAPY: IV antibody Vectibix every [redacted] weeks along with oral chemo Xeloda for 1 week on and 1 week off starting 06/06/17. For cycle 2 she had 20081mXeloda for 10 days and then 10 days off. Return to 1 week on and 1 week off with starting with Cycle 3. Held after 07/24/17 due to hospitalization for SBO.Restarted Xeloda and Vectibix on 09/04/17  INTERVAL HISTORY: Ms. SaFromereturns for follow-up as scheduled prior to cycle 8 vectibix and continuing Xeloda 1 week on 1 week off.  She began current cycle of Xeloda pills this morning.  She had one nausea with vomiting episode after BM on Monday and Tuesday of this week. Did not take antiemetic as the nausea passed after vomiting. No constipation or diarrhea.  These episodes occurred more frequently prior to being on chemo. Felt mildly weak after these episodes. Slightly increased fatigued this week but was able to function normally and eat and drink adequately; no mucositis.  Has a small crack on her fingertip, occasionally sensitive when using her hands; intermittent mild numbness or tingling to hands and none in feet.  Denies fever, chills, cough, dyspnea, chest pain.  Lower extremity edema is stable, no calf pain.  Wears compression stockings.  Sleeping better as she is having less night sweats. Denies abdominal pain.   REVIEW OF SYSTEMS:  Constitutional: Denies fevers, chills or abnormal weight loss (+) mild fatigue increased from baseline Eyes: Denies blurriness of vision Ears, nose, mouth, throat, and face: Denies mucositis or sore throat Respiratory: Denies cough, dyspnea or wheezes Cardiovascular: Denies palpitation, chest discomfort (+) lower extremity swelling, on lasix and wears compression stockings Gastrointestinal:  Denies constipation, diarrhea, blood in stool, heartburn or change  in bowel habits (+) 1 N/V episode after BM on Monday and Tuesday of this week Skin: Denies abnormal skin rashes (+) redness, applies moisturizer to hands and face (+) crack to fingertip, occasionally sensitive  Lymphatics: Denies new lymphadenopathy or easy bruising Neurological: (+) mild weakness after episodes of N/V (+) intermittent mild numbness/tingling to hands  Behavioral/Psych: Mood is stable, no new changes  All other systems were reviewed with the patient and are negative.  MEDICAL HISTORY:  Past Medical History:  Diagnosis Date  . Aneurysm of ascending aorta (H[facility]) 12/03/2016   3.7 cm by echo 10/2016  . Colon cancer [facility])    dx via biospy from colonoscopy-- malignant ---  currently having work-up done w/ dr gross (general surgeon) and coordinate surgery w/ urologsit for renal tumor  . Essential hypertension 10/31/2016  . GERD (gastroesophageal reflux disease)   . Glaucoma   . Hiatal hernia   . HOH (hard of hearing)    bilateral even w/ hearing aids  . Hyperlipidemia   . Kidney tumor    left side  . Legally blind in right eye, as defined in [place]    due to macular degeneration  . Macular degeneration   . Nausea    intermittant due to colon mass  . Ovarian cyst   . Persistent atrial fibrillation ([place]) 10/31/2016   on ASA only  . PONV (postoperative nausea and vomiting)   . Wears glasses   . Wears hearing aid    bilateral    SURGICAL HISTORY: Past Surgical History:  Procedure Laterality Date  . BREAST CYST EXCISION  1990   benign  . COLONOSCOPY  09/04/2016  . CYSTOSCOPY WITH RETROGRADE PYELOGRAM, URETEROSCOPY AND STENT PLACEMENT Left 10/10/2016   Procedure: CYSTOSCOPY WITH RETROGRADE PYELOGRAM, DIAGNOSTIC URETEROSCOPY  AND STENT PLACEMENT;  Surgeon: Alexis Frock, MD;  Location: [facility];  Service: Urology;  Laterality: Left;  . ESOPHAGOGASTRODUODENOSCOPY  11/07/2014  . FLEXIBLE SIGMOIDOSCOPY N/A 08/11/2017   Procedure: FLEXIBLE SIGMOIDOSCOPY;   Surgeon: Clarene Essex, MD;  Location: [facility];  Service: Endoscopy;  Laterality: N/A;  With Fluoroscopy for colonic stent placement for colonic stricture  . NASAL SINUS SURGERY    . TONSILLECTOMY      I have reviewed the social history and family history with the patient and they are unchanged from previous note.  ALLERGIES:  is allergic to benazepril; tylenol [acetaminophen]; and bactrim [sulfamethoxazole-trimethoprim].  MEDICATIONS:  Current Outpatient Medications  Medication Sig Dispense Refill  . amLODipine (NORVASC) 5 MG tablet Take 1 tablet (5 mg total) by mouth daily. 90 tablet 1  . capecitabine (XELODA) 500 MG tablet Take 4 tablets (2,000 mg total) by mouth 2 (two) times daily after a meal. Take for 7 days then off 7 days 112 tablet 2  . clindamycin (CLINDAGEL) 1 % gel Apply topically 2 (two) times daily. 60 g 3  . diltiazem (CARDIZEM CD) 240 MG 24 hr capsule Take 1 capsule (240 mg total) by mouth daily. 90 capsule 1  . escitalopram (LEXAPRO) 10 MG tablet Take 10 mg by mouth daily.    Marland Kitchen latanoprost (XALATAN) 0.005 % ophthalmic solution  Place 1 drop into both eyes at bedtime.    Marland Kitchen losartan (COZAAR) 100 MG tablet Take 1 tablet (100 mg total) by mouth daily. 90 tablet 0  . Multiple Vitamins-Minerals (VITEYES AREDS ADVANCED) CAPS Take 1 capsule by mouth daily.    Marland Kitchen omeprazole (PRILOSEC) 40 MG capsule Take 40 mg by mouth daily.    Vladimir Faster Glycol-Propyl Glycol (SYSTANE) 0.4-0.3 % SOLN Apply 1 drop to eye daily as needed (dry eyes).     . polyethylene glycol (MIRALAX / GLYCOLAX) packet Take 17 g by mouth daily as needed for moderate constipation. 30 each 0  . diphenhydrAMINE (BENADRYL) 25 MG tablet Take 25 mg by mouth as needed.     . fexofenadine (ALLEGRA) 180 MG tablet Take 180 mg by mouth daily.    . furosemide (LASIX) 40 MG tablet Take 1 tablet (40 mg total) by mouth daily. (Patient not taking: Reported on 10/16/2017) 90 tablet 1  . metoprolol tartrate (LOPRESSOR) 25 MG tablet  1/2 TABLET BY MOUTH AS NEEDED FOR PALPITATIONS (Patient not taking: Reported on 10/16/2017) 30 tablet 1  . ondansetron (ZOFRAN ODT) 4 MG disintegrating tablet Take 1 tablet (4 mg total) by mouth every 8 (eight) hours as needed for nausea or vomiting. (Patient not taking: Reported on 10/16/2017) 30 tablet 0  . oxyCODONE (OXY IR/ROXICODONE) 5 MG immediate release tablet Take 1 tablet (5 mg total) by mouth every 6 (six) hours as needed for moderate pain or severe pain. (Patient not taking: Reported on 10/02/2017) 40 tablet 0  . Potassium Chloride ER 20 MEQ TBCR Take 20 mEq by mouth daily. (Patient not taking: Reported on 10/16/2017) 30 tablet 1   Current Facility-Administered Medications  Medication Dose Route Frequency Provider Last Rate Last Dose  . 0.9 %  sodium chloride infusion  250 mL Intravenous Continuous Skeet Latch, MD      . sodium chloride flush (NS) 0.9 % injection 3 mL  3 mL Intravenous Q12H Skeet Latch, MD      . sodium chloride flush (NS) 0.9 % injection 3 mL  3 mL Intravenous PRN Skeet Latch, MD        PHYSICAL EXAMINATION: ECOG PERFORMANCE STATUS: 1-2  Vitals:   10/16/17 1003  BP: 134/64  Pulse: 70  Resp: 20  Temp: 97.8 F (36.6 C)  SpO2: 99%   Filed Weights   10/16/17 1003  Weight: 175 lb 11.2 oz (79.7 kg)    GENERAL:alert, no distress and comfortable SKIN: skin color, texture, turgor are normal, no rashes or significant lesions (+) 1 small crack right thumb, no palmar erythema (+) few open comedones to tip of nose with dryness; no papulopustular rash (+) thin, brittle nails  EYES: normal, Conjunctiva are pink and non-injected, sclera clear OROPHARYNX:no exudate, no erythema and lips, buccal mucosa, and tongue normal  LYMPH:  no palpable cervical, supraclavicular, or axillary lymphadenopathy LUNGS: clear to auscultation bilaterally with normal breathing effort HEART: regular rate & rhythm and no murmurs (+) 1+ bilateral lower extremity  edema ABDOMEN:abdomen soft, non-tender and normal bowel sounds. No hepatomegaly  Musculoskeletal:no cyanosis of digits and no clubbing  NEURO: alert & oriented x 3 with fluent speech, no focal motor/sensory deficits  LABORATORY DATA:  I have reviewed the data as listed CBC Latest Ref Rng & Units 10/16/2017 10/02/2017 09/18/2017  WBC 3.9 - 10.3 K/uL 7.9 8.8 10.0  Hemoglobin 11.6 - 15.9 g/dL 11.9 11.8 12.9  Hematocrit 34.8 - 46.6 % 35.6 35.8 39.4  Platelets 145 - 400 K/uL  324 324 364     CMP Latest Ref Rng & Units 10/16/2017 10/02/2017 09/18/2017  Glucose 70 - 140 mg/dL 83 71 82  BUN 7 - 26 mg/dL _0 Creatinine 0.60 - 1.10 mg/dL 0.91 0.85 0.79  Sodium 136 - 145 mmol/L 139 139 139  Potassium 3.5 - 5.1 mmol/L 3.8 3.4(L) 3.5  Chloride 98 - 109 mmol/L 105 105 105  CO2 22 - 29 mmol/L _1 Calcium 8.4 - 10.4 mg/dL 9.6 9.3 9.1  Total Protein 6.4 - 8.3 g/dL 7.6 7.2 7.8  Total Bilirubin 0.2 - 1.2 mg/dL 0.8 0.5 0.5  Alkaline Phos 40 - 150 U/L 97 103 126  AST 5 - 34 U/L _2 ALT 0 - 55 U/L _3 PATHOLOGY: [facility] 11/11/16   Diagnosis 11/11/16 Liver, needle/core biopsy, left lobe - METASTATIC ADENOCARCINOMA. - SEE COMMENT. Microscopic Comment The histologic features are consistent with a primary gastrointestinal adenocarcinoma. Dr. Vicente Males has reviewed the case and concurs with this interpretation. There is likely sufficient tumor present for additional studies, if requested. Enid Cutter MD Pathologist, Electronic Signature (Case signed 11/12/2016)  Diagnosis 09/04/2016 MODERATELY DIFFERENTIATED ADENOCARCINOMA OF COLON AT 20 CM Tumor Site: Colon at 20 cm Tumor Type: Adenocarcinoma Tumore Size: Unknown  Grade: Moderately differentiated   RADIOGRAPHIC STUDIES: I have personally reviewed the radiological images as listed and agreed with the findings in the report. No results found.   ASSESSMENT & PLAN: Mary [name] is a84 y.o.female   1. Adenocarcinoma of  left colon, sigmoid colon, TxNxM1 with liver and lung mets, stage IV, KRAS/NRS wild type, MSI-stable 2. Abdominal pain 3. Night sweats 4. Erythema and dryness, facial acne type rash, secondary to chemo 5. Goals of care 6. LE swelling 7. Left renal mass with left hydronephrosis 8. Complex cyst of left ovary 9. Afib 10. HTN 11. Nutrition 12. Anemia 13. SBO, s/p sigmoid stent placement 14. Hypokalemia  15. Nail toxicity   Ms. Pettitt appears stable. She completed 7 cycles vectibix and continues Xeloda 2000 mg BID 1 week on/1 week off. She is tolerating well overall, very mild skin and nail toxicity secondary to therapy; she applies lotion and we discussed using hydrocortisone should hands and feet become red or sensitive. Abdominal pain resolved. Night sweats are improved overall. LE swelling is stable. VS and weight are stable. CBC, CMP reviewed; adequate for treatment. Slightly decreased RBC but Hgb normal. Labs adequate to proceed with cycle 8 vectibix and continue xeloda 2000 mg BID 1 week on and 1 week off. Will obtain restaging CT after this cycle. F/u in 2 weeks for CT results and next cycle.   PLAN: -Labs reviewed, proceed with cycle 8 vectibix -Continue Xeloda 2000 mg BID 1 week on and 1 week off -Skin care -CT approx 1 week, ordered today -f/u in 2 weeks for CT results and next cycle   Orders Placed This Encounter  Procedures  . CT Abdomen Pelvis W Contrast    Standing Status:   Future    Standing Expiration Date:   10/16/2018    Order Specific Question:   If indicated for the ordered procedure, I authorize the administration of contrast media per Radiology protocol    Answer:   Yes    Order Specific Question:   Preferred imaging location?    Answer:   [facility]    Order Specific Question:   Radiology Contrast Protocol - do NOT remove file path  Answer:   [email or web address]    Order Specific Question:   Reason for Exam additional  comments    Answer:   metastatic colon cancer, on chemotherapy; restaging  . CT Chest W Contrast    Standing Status:   Future    Standing Expiration Date:   10/16/2018    Order Specific Question:   If indicated for the ordered procedure, I authorize the administration of contrast media per Radiology protocol    Answer:   Yes    Order Specific Question:   Preferred imaging location?    Answer:   [facility]    Order Specific Question:   Radiology Contrast Protocol - do NOT remove file path    Answer:   [email or web address]    Order Specific Question:   Reason for Exam additional comments    Answer:   metastatic colon cancer, on chemotherapy; restaging   All questions were answered. The patient knows to call the clinic with any problems, questions or concerns. No barriers to learning was detected.     Alla Feeling, NP 10/16/17

## 2017-10-17 ENCOUNTER — Telehealth: Payer: Self-pay | Admitting: Hematology

## 2017-10-17 ENCOUNTER — Telehealth: Payer: Self-pay | Admitting: Emergency Medicine

## 2017-10-17 NOTE — Telephone Encounter (Signed)
Called pts daughter in law to give CT instructions. She verbalized understanding of scan instructions.

## 2017-10-17 NOTE — Telephone Encounter (Signed)
NO los 3/7

## 2017-10-22 ENCOUNTER — Ambulatory Visit (HOSPITAL_COMMUNITY)
Admission: RE | Admit: 2017-10-22 | Discharge: 2017-10-22 | Disposition: A | Discharge status: Home / Self Care | Payer: Medicare Other | Source: Ambulatory Visit | Attending: Nurse Practitioner | Admitting: Nurse Practitioner

## 2017-10-22 ENCOUNTER — Encounter: Payer: Self-pay | Admitting: Cardiovascular Disease

## 2017-10-22 ENCOUNTER — Ambulatory Visit (INDEPENDENT_AMBULATORY_CARE_PROVIDER_SITE_OTHER): Payer: Medicare Other | Admitting: Cardiovascular Disease

## 2017-10-22 ENCOUNTER — Encounter (HOSPITAL_COMMUNITY): Payer: Self-pay

## 2017-10-22 VITALS — BP 119/70 | HR 81 | Ht 66.0 in | Wt 175.4 lb

## 2017-10-22 DIAGNOSIS — C187 Malignant neoplasm of sigmoid colon: Secondary | ICD-10-CM | POA: Insufficient documentation

## 2017-10-22 DIAGNOSIS — I110 Hypertensive heart disease with heart failure: Secondary | ICD-10-CM | POA: Diagnosis not present

## 2017-10-22 DIAGNOSIS — C787 Secondary malignant neoplasm of liver and intrahepatic bile duct: Secondary | ICD-10-CM | POA: Insufficient documentation

## 2017-10-22 DIAGNOSIS — Z5111 Encounter for antineoplastic chemotherapy: Secondary | ICD-10-CM | POA: Diagnosis not present

## 2017-10-22 DIAGNOSIS — K8020 Calculus of gallbladder without cholecystitis without obstruction: Secondary | ICD-10-CM | POA: Diagnosis not present

## 2017-10-22 DIAGNOSIS — R590 Localized enlarged lymph nodes: Secondary | ICD-10-CM | POA: Insufficient documentation

## 2017-10-22 DIAGNOSIS — E7800 Pure hypercholesterolemia, unspecified: Secondary | ICD-10-CM | POA: Diagnosis not present

## 2017-10-22 DIAGNOSIS — I481 Persistent atrial fibrillation: Secondary | ICD-10-CM | POA: Diagnosis not present

## 2017-10-22 DIAGNOSIS — I5033 Acute on chronic diastolic (congestive) heart failure: Secondary | ICD-10-CM

## 2017-10-22 DIAGNOSIS — C7800 Secondary malignant neoplasm of unspecified lung: Secondary | ICD-10-CM | POA: Diagnosis not present

## 2017-10-22 DIAGNOSIS — I700 Atherosclerosis of aorta: Secondary | ICD-10-CM | POA: Diagnosis not present

## 2017-10-22 DIAGNOSIS — Z978 Presence of other specified devices: Secondary | ICD-10-CM | POA: Diagnosis not present

## 2017-10-22 DIAGNOSIS — C189 Malignant neoplasm of colon, unspecified: Secondary | ICD-10-CM | POA: Diagnosis not present

## 2017-10-22 DIAGNOSIS — I4819 Other persistent atrial fibrillation: Secondary | ICD-10-CM

## 2017-10-22 MED ORDER — SODIUM CHLORIDE 0.9 % IJ SOLN
INTRAMUSCULAR | Status: AC
  Filled 2017-10-22: qty 50

## 2017-10-22 MED ORDER — IOPAMIDOL (ISOVUE-300) INJECTION 61%
INTRAVENOUS | Status: AC
  Administered 2017-10-22: 100 mL via INTRAVENOUS
  Filled 2017-10-22: qty 100

## 2017-10-22 MED ORDER — IOPAMIDOL (ISOVUE-300) INJECTION 61%
100.0000 mL | Freq: Once | INTRAVENOUS | Status: AC | PRN
  Administered 2017-10-22: 100 mL via INTRAVENOUS

## 2017-10-22 NOTE — Patient Instructions (Signed)

## 2017-10-22 NOTE — Progress Notes (Signed)
Cardiology Office Note   Date:  10/22/2017   ID:  Mary [name], DOB 02/07/1933, MRN [id number]  PCP:  Moshe Cipro, MD  Cardiologist:   Skeet Latch, MD  Surgeon:  Michael Boston, MD Medical Oncologist: Truitt Merle, MD Gastroenterologist: Toma Deiters, MD Urology: Alexis Frock, MD  Chief Complaint  Patient presents with  . Follow-up    4 months;    History of Present Illness: Mary [name] is an 84 y.o. female with persistent atrial fibrillation, hypertension, hyperlipidemia, metastatic colon cancer, and macular degeneration, who presents for follow up.  She was first seen 10/2016 for management of atrial fibrillation and pre-surgical risk assessment.  Mary [name] was diagnosed with adenocarcinoma of the sigmoid colon 08/2016.  She had significant obstructive symptoms but changed her diet with improvement in her symptoms. She underwent cystoscopy and ureteroscopy with insertion of a L ureteral stent on 10/10/16.  At that time she was noted to be in atrial fibrillation.  She had an echo 11/04/16 that revealed LVEF 60-65% with mild LVH and a mildly dilated ascending aorta.  She was first seen by cardiology 12/2016 and reported fatigue that was attributed to her atrial fibrillation.  At that time it was unclear if she would pursue surgery or chemotherapy.  She has since elected for palliative care and was interested in cardioversion.  After discussion with her oncologist she was started on Eliquis with plans for outpatient TEE/DCCV.  However, she quickly developed hematochezia and it was discontinued.    Mary [name] started on chemotherapy 06/06/17 with IV antibody Vectibix every 2 week and oral Xeloda off/on at 1 week intervals. There was improvement in her hepatic metastases and no change in her lungs.   She was admitted 07/2017-08/2017 with bowel obstruction.  A colonic stent was place.  She continues on Xeloda.  Since that time she has been feeling relatively well.  She went outside and  did yardwork yesterday.  She hasn't experienced any chest pain or palpitations.  She gets short of breath when she exerts herself.  She has mild LE edema that is unchanged and no orthopnea or PND.     Past Medical History:  Diagnosis Date  . Aneurysm of ascending aorta (HCC) 12/03/2016   3.7 cm by echo 10/2016  . Colon cancer [facility])    dx via biospy from colonoscopy-- malignant ---  currently having work-up done w/ dr gross (general surgeon) and coordinate surgery w/ urologsit for renal tumor  . Essential hypertension 10/31/2016  . GERD (gastroesophageal reflux disease)   . Glaucoma   . Hiatal hernia   . HOH (hard of hearing)    bilateral even w/ hearing aids  . Hyperlipidemia   . Kidney tumor    left side  . Legally blind in right eye, as defined in [place]    due to macular degeneration  . Macular degeneration   . Nausea    intermittant due to colon mass  . Ovarian cyst   . Persistent atrial fibrillation ([place]) 10/31/2016   on ASA only  . PONV (postoperative nausea and vomiting)   . Wears glasses   . Wears hearing aid    bilateral    Past Surgical History:  Procedure Laterality Date  . BREAST CYST EXCISION  1990   benign  . COLONOSCOPY  09/04/2016  . CYSTOSCOPY WITH RETROGRADE PYELOGRAM, URETEROSCOPY AND STENT PLACEMENT Left 10/10/2016   Procedure: CYSTOSCOPY WITH RETROGRADE PYELOGRAM, DIAGNOSTIC URETEROSCOPY  AND STENT PLACEMENT;  Surgeon: Alexis Frock,  MD;  Location: [place];  Service: Urology;  Laterality: Left;  . ESOPHAGOGASTRODUODENOSCOPY  11/07/2014  . FLEXIBLE SIGMOIDOSCOPY N/A 08/11/2017   Procedure: FLEXIBLE SIGMOIDOSCOPY;  Surgeon: Clarene Essex, MD;  Location: [facility];  Service: Endoscopy;  Laterality: N/A;  With Fluoroscopy for colonic stent placement for colonic stricture  . NASAL SINUS SURGERY    . TONSILLECTOMY       Current Outpatient Medications  Medication Sig Dispense Refill  . amLODipine (NORVASC) 5 MG tablet Take 1 tablet (5 mg  total) by mouth daily. 90 tablet 1  . capecitabine (XELODA) 500 MG tablet Take 4 tablets (2,000 mg total) by mouth 2 (two) times daily after a meal. Take for 7 days then off 7 days 112 tablet 2  . clindamycin (CLINDAGEL) 1 % gel Apply topically 2 (two) times daily. 60 g 3  . diltiazem (CARDIZEM CD) 240 MG 24 hr capsule Take 1 capsule (240 mg total) by mouth daily. 90 capsule 1  . escitalopram (LEXAPRO) 10 MG tablet Take 10 mg by mouth daily.    . furosemide (LASIX) 40 MG tablet Take 1 tablet (40 mg total) by mouth daily. 90 tablet 1  . latanoprost (XALATAN) 0.005 % ophthalmic solution Place 1 drop into both eyes at bedtime.    Marland Kitchen losartan (COZAAR) 100 MG tablet Take 1 tablet (100 mg total) by mouth daily. 90 tablet 0  . metoprolol tartrate (LOPRESSOR) 25 MG tablet 1/2 TABLET BY MOUTH AS NEEDED FOR PALPITATIONS 30 tablet 1  . Multiple Vitamins-Minerals (VITEYES AREDS ADVANCED) CAPS Take 1 capsule by mouth daily.    Marland Kitchen omeprazole (PRILOSEC) 40 MG capsule Take 40 mg by mouth daily.    . ondansetron (ZOFRAN ODT) 4 MG disintegrating tablet Take 1 tablet (4 mg total) by mouth every 8 (eight) hours as needed for nausea or vomiting. 30 tablet 0  . oxyCODONE (OXY IR/ROXICODONE) 5 MG immediate release tablet Take 1 tablet (5 mg total) by mouth every 6 (six) hours as needed for moderate pain or severe pain. 40 tablet 0  . Polyethyl Glycol-Propyl Glycol (SYSTANE) 0.4-0.3 % SOLN Apply 1 drop to eye daily as needed (dry eyes).     . polyethylene glycol (MIRALAX / GLYCOLAX) packet Take 17 g by mouth daily as needed for moderate constipation. 30 each 0  . Potassium Chloride ER 20 MEQ TBCR Take 20 mEq by mouth daily. 30 tablet 1   Current Facility-Administered Medications  Medication Dose Route Frequency Provider Last Rate Last Dose  . 0.9 %  sodium chloride infusion  250 mL Intravenous Continuous Skeet Latch, MD      . sodium chloride flush (NS) 0.9 % injection 3 mL  3 mL Intravenous Q12H Skeet Latch,  MD      . sodium chloride flush (NS) 0.9 % injection 3 mL  3 mL Intravenous PRN Skeet Latch, MD       Facility-Administered Medications Ordered in Other Visits  Medication Dose Route Frequency Provider Last Rate Last Dose  . sodium chloride 0.9 % injection             Allergies:   Benazepril; Tylenol [acetaminophen]; and Bactrim [sulfamethoxazole-trimethoprim]    Social History:  The patient  reports that  has never smoked. she has never used smokeless tobacco. She reports that she does not drink alcohol or use drugs.   Family History:  The patient's family history includes Cancer (age of onset: 84) in her brother; Cancer (age of onset: 28) in her sister;  Diabetes in her son; High blood pressure in her mother and son; Peripheral Artery Disease in her mother.    ROS:  Please see the history of present illness.   Otherwise, review of systems are positive for dizziness.   All other systems are reviewed and negative.    PHYSICAL EXAM: VS:  BP 119/70   Pulse 81   Ht 5\' 6"  (1.676 m)   Wt 175 lb 6.4 oz (79.6 kg)   BMI 28.31 kg/m  , BMI Body mass index is 28.31 kg/m. GENERAL:  Well appearing HEENT: Pupils equal round and reactive, fundi not visualized, oral mucosa unremarkable NECK:  No jugular venous distention, waveform within normal limits, carotid upstroke brisk and symmetric, no bruits, no thyromegaly LYMPHATICS:  No cervical adenopathy LUNGS:  Clear to auscultation bilaterally HEART:  Irregularly irregular.  PMI not displaced or sustained,S1 and S2 within normal limits, no S3, no S4, no clicks, no rubs, no murmurs ABD:  Flat, positive bowel sounds normal in frequency in pitch, no bruits, no rebound, no guarding, no midline pulsatile mass, no hepatomegaly, no splenomegaly EXT:  2 plus pulses throughout, no edema, no cyanosis no clubbing SKIN:  No rashes no nodules NEURO:  Cranial nerves II through XII grossly intact, motor grossly intact throughout PSYCH:  Cognitively intact,  oriented to person place and time   EKG:  EKG is not ordered today. The ekg ordered 10/31/16 demonstrates atrial fibrillation.  Rate 105 bpm. LAFB. Poor R wave progression 01/08/17: Atrial fibrillation.  Rate 78 bpm.  LAD. 07/08/17: Atrial fibrillation.  Rate 74 bpm.  LAFB.    Echo 11/04/16: Study Conclusions  - Left ventricle: The cavity size was normal. Wall thickness was   increased in a pattern of mild LVH. Indeterminant diastolic   function (atrial fibrillation). Systolic function was normal. The   estimated ejection fraction was in the range of 60% to 65%. Wall   motion was normal; there were no regional wall motion   abnormalities. - Aortic valve: There was no stenosis. - Aorta: Mildly dilated aortic root. Aortic root dimension: 37 mm   (ED). - Mitral valve: Mildly calcified annulus. There was trivial   regurgitation. - Left atrium: The atrium was mildly dilated. - Right ventricle: The cavity size was normal. Systolic function   was normal. - Right atrium: The atrium was mildly dilated. - Tricuspid valve: Peak RV-RA gradient (S): 34 mm Hg. - Pulmonary arteries: PA peak pressure: 37 mm Hg (S). - Inferior vena cava: The vessel was normal in size. The   respirophasic diameter changes were in the normal range (>= 50%),   consistent with normal central venous pressure.   Recent Labs: 10/31/2016: TSH 1.82 10/16/2017: ALT 11; BUN 8; Creatinine, Ser 0.91; Hemoglobin 11.9; Magnesium 2.2; Platelets 324; Potassium 3.8; Sodium 139    Lipid Panel No results found for: CHOL, TRIG, HDL, CHOLHDL, VLDL, LDLCALC, LDLDIRECT    Wt Readings from Last 3 Encounters:  10/22/17 175 lb 6.4 oz (79.6 kg)  10/16/17 175 lb 11.2 oz (79.7 kg)  10/02/17 176 lb 9.6 oz (80.1 kg)      ASSESSMENT AND PLAN:  # Persistent atrial fibrillation: Ms. Cortez remains in atrial fibrillation.  Rates are controlled and she is asymptomatic.  She cannot tolerate anticoagulation 2/2 GI malignancy.  Continue  metoprolol and diltiazem.   # Hypertensive heart disease:  Blood pressure controlled on amlodipine, diltiazem and losartan.  Continue lasix.   # Hyperlipidemia: Atorvastatin was discontinued as she is choosing a  palliative approach to care.   # Hematochezia: h/h stable 10/2017.  # Mild ascending aortic aneurysm: No further monitoring indicated given comorbid disease.  Current medicines are reviewed at length with the patient today.  The patient does not have concerns regarding medicines.  The following changes have been made: none  Labs/ tests ordered today include:   No orders of the defined types were placed in this encounter.    Disposition:   FU with Tahnee Cifuentes C. Oval Linsey, MD, [facility] in 6 months.   This note was written with the assistance of speech recognition software.  Please excuse any transcriptional errors.  Signed, Latacha Texeira C. Oval Linsey, MD, [facility]  10/22/2017 11:25 AM    [place]

## 2017-10-27 ENCOUNTER — Other Ambulatory Visit: Payer: Self-pay | Admitting: Hematology

## 2017-10-27 DIAGNOSIS — C186 Malignant neoplasm of descending colon: Secondary | ICD-10-CM

## 2017-10-29 ENCOUNTER — Other Ambulatory Visit: Payer: Self-pay | Admitting: *Deleted

## 2017-10-29 DIAGNOSIS — C186 Malignant neoplasm of descending colon: Secondary | ICD-10-CM

## 2017-10-29 MED ORDER — CAPECITABINE 500 MG PO TABS: 1000.0000 mg/m2 | ORAL_TABLET | Freq: Two times a day (BID) | ORAL | 2 refills | Status: DC

## 2017-10-29 MED FILL — XELODA 500 MG TABLET: 500 | 28 days supply | Qty: 112 | Fill #0

## 2017-10-29 NOTE — Progress Notes (Signed)
[facility]  [phone] [phone]  Clinic Follow Up Note   Patient Care Team: Moshe Cipro, MD as PCP - General (Internal Medicine) Alexis Frock, MD as Consulting Physician (Urology) Michael Boston, MD as Consulting Physician (General Surgery) Toma Deiters, MD as Referring Physician (Gastroenterology)   Date of Service:  10/31/2017  CHIEF COMPLAINTS:  Metastatic sigmoid colon Adenocarcinoma  Oncology History   Cancer Staging Cancer of left colon [facility]) Staging form: Colon and Rectum, AJCC 8th Edition - Clinical stage from 09/04/2016: Stage IVB (cTX, cNX, pM1b) - Signed by Truitt Merle, MD on 11/12/2016       Cancer of sigmoid colon ([place])   09/02/2016 Procedure    Colonoscopy Diverticulosis (scattered). Pt has about 10 cm structure starting at 20 cm with abnormal mucosa. Biopsies taken.       09/02/2016 Pathology Results    MODERATELY DIFFERENTIATED ADENOCARCINOMA OF COLON AT 20 CM      09/04/2016 Initial Diagnosis    Cancer of left colon ([place])      09/06/2016 Imaging    CT CAP w Contrast 1. No acute abdominopelvic process.  2. Heterogenous hypodense area involving the medial edge of the left kidney with multiple small soft tissue like stranding involving the left pararenal space. Findings are suspicious for malignancy of the left kidney with small pararenal metastases. MRI abdomen with contrast should be considered to further evaluate.  3. Vague hypodensity involving the lateral segment of the liver. This can also be reevaluated with MRI.  4. Partially visualized 5 mm posterior right lower lobe nodular density. CT chest without contrast follow up is recommended.  5. Complex multilobular left ovarian lesion may reflect a cluster of cysts.      10/10/2016 Procedure    Operative Report by Dr. Tresa Moore on 10/18/16 Procedure: 1) Cystoscopy, left retrograde pyelogram interpretation. 2) Left diagnostic uteroscopy. 3) Insertion of left uteral  stent; 5 x 24 Polaris with tether. Findings: 1) Unremarkable bladder. 2) Unremarkable left retrograde pyelogram. 3) No evidence of intraluminal urothelial neoplasm whatsoever with insepction of the left kidney and ureter times several. 4) Successful placement of left ureteral stent, proximal in renal pelvis and distal in urinary bladder.      10/15/2016 Imaging    MRI abdomen pelvis 1. There are 2 enhancing hepatic lesions highly worrisome for metastatic disease, likely metastatic colon cancer. 2. The lesion of concern in the anterior suprahilar lip of the left kidney is also enhancing, worrisome for neoplasm. This has a somewhat atypical appearance for renal cell carcinoma and could reflect a metastasis as well. 3. New left-sided hydroureter with delayed contrast excretion consistent with distal ureteral obstruction. Specific etiology uncertain. 4. Complex cystic and solid left adnexal lesion could reflect cystic neoplasm, although does not appear aggressive or appear to reflect the cause of the distal left ureteral obstruction. 5. Sigmoid colon wall thickening could be related to reported newly diagnosed colon cancer.      11/06/2016 PET scan    PET 11/06/2016 IMPRESSION: 1. Hypermetabolic pulmonary and hepatic lesions, most consistent with metastatic colon cancer, with the hypermetabolic primary located in the sigmoid colon. 2. Left renal and left adnexal lesions, better seen and evaluated on 10/15/2016. 3. Persistent moderate left hydronephrosis. 4. Aortic atherosclerosis (ICD10-170.0).      11/11/2016 Pathology Results    Liver, needle/core biopsy, left lobe - METASTATIC ADENOCARCINOMA. The histologic features are consistent with a primary gastrointestinal adenocarcinoma.      05/19/2017 Imaging    CT Abdomen  Pelvis, 05/19/2017 IMPRESSION: 1. Worsening pulmonary and hepatic metastatic disease. Primary sigmoid lesion is stable to minimally enlarged. 2. Stable to slight  enlargement of a left perinephric mass, likely a metastasis. Renal cell carcinoma is not excluded. Associated pelvocaliectasis versus mild hydronephrosis. 3. Borderline left periaortic lymph nodes, slightly enlarged. 4. Cholelithiasis. 5.  Aortic atherosclerosis (ICD10-170.0). 6. Cystic and solid lesion in the left adnexa, as on prior exams, likely ovarian in origin.      06/06/2017 -  Chemotherapy    IV antibody Vectibix every [redacted] weeks along with oral chemo Xeloda for 1 week on and 1 week off starting 06/06/17.   For cycle 2 she had 2066m Xeloda for 10 days and then 10 days off. Return to 1 week on and 1 week off with starting with Cycle 3. Held after 07/24/17 due to hospitalization for SBO. Restarted Xeloda and Vectibix on 09/04/17      08/07/2017 - 08/16/2017 Hospital Admission    Admit date: 08/07/17 Admission diagnosis: Bowel Obstruction  Additional comments: She was admitted to the hospital on 08/07/17 [facility] due to a bowel obstruction.  She had a sigmoid colon stent placement on 08/11/17. Bowel obstruction resolved.  CT scan in hospital showed great partial response to chemotherapy. She will continue chemotherapy given good overall tolerance.        08/07/2017 Imaging    CT AP W Contrast 08/07/17 IMPRESSION: Low colonic obstruction secondary to a rectosigmoid stricture at the site of the patient's prior colonic mass, which is no longer evident on CT. Mild upstream colonic wall thickening raises the possibility of superimposed colitis.  Improving hepatic metastases, as above. Left perirenal metastasis, decreased. Visualized pulmonary metastases at the lung bases are grossly unchanged.  3.5 cm mixed cystic/solid left ovarian mass is mildly decreased.       08/11/2017 Procedure    By Dr. MWatt Climes Flexible Sigmoidoscopy 08/11/17 IMPRESSION:   - Preparation of the colon was fair. - Likely malignant partially obstructing tumor in the  recto-sigmoid colon versus necrotic scarring. Injected with contrast proximal to obstruction to confirm proper position. Prosthesis placed. - Stool in the rectum. - No specimens collected.       10/22/2017 Imaging    CT CAP W Contrast 10/22/17 IMPRESSION: 1. Significant interval improvement and liver metastasis. 2. The left perinephric mass is slightly decreased in size in the interval. 3. No significant interval change in the appearance of multiple pulmonary metastasis. 4. Stable appearance of mediastinal adenopathy. 5. Gallstones 6.  Aortic Atherosclerosis (ICD10-I70.0). 7. Colonic stent remains in place without evidence for bowel obstruction.      HISTORY OF PRESENTING ILLNESS (10/25/2016):  Mary [id number]. female is here because of adenocarcinoma of the colon and a left renal mass. The pt noticed worsening episodes of abdominal cramping and nausea with bowel movements in July 2017. She had an abdominal UK[place]aperformed, which was unremarkable. A colonoscopy was then performed at the patient's request, which found a mass about 20 cm from the anal verge. Biopsy showed moderately differentiated adenocarcinoma. MRI done, with a lesion noticed in the left kidney near the pelvis, a 11 mm hepatic lesion, 4 mm lung nodule, and possible cystic left ovarian mass. Pt saw Dr. GJohney Mainefor information on abdominal surgery for her colon, which she declined. She also saw Dr. MLorna Dibble who is considering a nephrectomy, but is planning a sternoscopy to better characterize the kidney lesion. She presents today for continued treatment.  She presents today with her son and daughter-in-law. The symptoms started in late July 2017. She had some vomiting, abdominal pain, and constipation, but not bleeding. Her PCP never found any anemia. Initially she lost her appetite, but now she is taking multiple vitamins and supplements, so her appetite is back. Her bowel has improved, but she still has abdominal pain that  comes and goes, lasting minutes. The pain is tolerable with aleve. She did not tolerate Tramadol well; nausea and vomiting. She has some occasional left sides back pain. Denies weight loss, or any other concerns. She is able to do everything she needs to around the house.   She had a partial hysterectomy, and a lumpectomy. She has a history of HTN, atrial fibrillation, high cholesterol. She has some hearing loss and can not see out of her right eye. Not a diabetic, but her son and her mother are. No family history of colon cancer. Her sister had breast cancer in her 13's and her brother had lung cancer in his 48's. Never smoker. Doesn't drink alcohol. 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 She has two sons; one lives an hour away, and the other lives in [place]. She lives alone.   CURRENT THERAPY:   IV antibody Vectibix every [redacted] weeks along with oral chemo Xeloda for 1 week on and 1 week off starting 06/06/17. For cycle 2 she had 2085m Xeloda for 10 days and then 10 days off. Return to 1 week on and 1 week off with starting with Cycle 3. Held after 07/24/17 due to hospitalization for SBO. Restarted Xeloda and Vectibix on 09/04/17  INTERVAL HISTORY:   AKATRINKA HERBISONis here for a follow up. She presents in the clinic today accompanied by her daughter-in-law and her granddaughter. She reports she is doing well overall and tolerating Xeloda and vecitibix mostly. She states she is having some trouble with bowel movements and she has some pain due to constipation. She is taking 100 mg Colace and Miralax BID. She notes to have a bowel movement daily. She takes tramadol for pain occasionally. She endorses she is able to take care of herself at home. She has good appetite but is having a little trouble with the low fiber diet. She reports she does have some skin cracking on her hands.   On review of systems, pt denies diarrhea, or any other complaints at this time. Pertinent positives are listed and detailed  within the above HPI.   MEDICAL HISTORY:  Past Medical History:  Diagnosis Date  . Aneurysm of ascending aorta ([facility]) 12/03/2016   3.7 cm by echo 10/2016  . Colon cancer ([facility]    dx via biospy from colonoscopy-- malignant ---  currently having work-up done w/ dr gross (general surgeon) and coordinate surgery w/ urologsit for renal tumor  . Essential hypertension 10/31/2016  . GERD (gastroesophageal reflux disease)   . Glaucoma   . Hiatal hernia   . HOH (hard of hearing)    bilateral even w/ hearing aids  . Hyperlipidemia   . Kidney tumor    left side  . Legally blind in right [place], [place]   due to macular degeneration  . Macular degeneration   . Nausea    intermittant due to colon mass  . Ovarian cyst   . Persistent atrial fibrillation ([place] 10/31/2016   on ASA only  . PONV (postoperative nausea and vomiting)   . Wears glasses   . Wears hearing aid  bilateral    SURGICAL HISTORY: Past Surgical History:  Procedure Laterality Date  . BREAST CYST EXCISION  1990   benign  . COLONOSCOPY  09/04/2016  . CYSTOSCOPY WITH RETROGRADE PYELOGRAM, URETEROSCOPY AND STENT PLACEMENT Left 10/10/2016   Procedure: CYSTOSCOPY WITH RETROGRADE PYELOGRAM, DIAGNOSTIC URETEROSCOPY  AND STENT PLACEMENT;  Surgeon: Alexis Frock, MD;  Location: [facility];  Service: Urology;  Laterality: Left;  . ESOPHAGOGASTRODUODENOSCOPY  11/07/2014  . FLEXIBLE SIGMOIDOSCOPY N/A 08/11/2017   Procedure: FLEXIBLE SIGMOIDOSCOPY;  Surgeon: Clarene Essex, MD;  Location: [facility];  Service: Endoscopy;  Laterality: N/A;  With Fluoroscopy for colonic stent placement for colonic stricture  . NASAL SINUS SURGERY    . TONSILLECTOMY      SOCIAL HISTORY: Social History   Socioeconomic History  . Marital status: Widowed    Spouse name: Not on file  . Number of children: Not on file  . Years of education: Not on file  . Highest education level: Not on file  Occupational History  . Not on file   Social Needs  . Financial resource strain: Not on file  . Food insecurity:    Worry: Not on file    Inability: Not on file  . Transportation needs:    Medical: Not on file    Non-medical: Not on file  Tobacco Use  . Smoking status: Never Smoker  . Smokeless tobacco: Never Used  Substance and Sexual Activity  . Alcohol use: No  . Drug use: No  . Sexual activity: Not on file  Lifestyle  . Physical activity:    Days per week: Not on file    Minutes per session: Not on file  . Stress: Not on file  Relationships  . Social connections:    Talks on phone: Not on file    Gets together: Not on file    Attends religious service: Not on file    Active member of club or organization: Not on file    Attends meetings of clubs or organizations: Not on file    Relationship status: Not on file  . Intimate partner violence:    Fear of current or ex partner: Not on file    Emotionally abused: Not on file    Physically abused: Not on file    Forced sexual activity: Not on file  Other Topics Concern  . Not on file  Social History Narrative  . Not on file    FAMILY HISTORY: Family History  Problem Relation Age of Onset  . Cancer Sister 60       breast cancer   . Cancer Brother 43       lung cancer   . Diabetes Son   . High blood pressure Son   . Peripheral Artery Disease Mother   . High blood pressure Mother     ALLERGIES:  is allergic to benazepril; tylenol [acetaminophen]; and bactrim [sulfamethoxazole-trimethoprim].  MEDICATIONS:  Current Outpatient Medications  Medication Sig Dispense Refill  . amLODipine (NORVASC) 5 MG tablet Take 1 tablet (5 mg total) by mouth daily. 90 tablet 1  . capecitabine (XELODA) 500 MG tablet Take 4 tablets (2,000 mg total) by mouth 2 (two) times daily after a meal. Take for 7 days then off 7 days 112 tablet 2  . clindamycin (CLINDAGEL) 1 % gel Apply topically 2 (two) times daily. 60 g 3  . diltiazem (CARDIZEM CD) 240 MG 24 hr capsule Take 1  capsule (240 mg total) by mouth daily. 90 capsule  1  . docusate sodium (COLACE) 100 MG capsule Take 1 capsule (100 mg total) by mouth 2 (two) times daily. 60 capsule 2  . escitalopram (LEXAPRO) 10 MG tablet Take 10 mg by mouth daily.    . furosemide (LASIX) 40 MG tablet Take 1 tablet (40 mg total) by mouth daily. 90 tablet 1  . latanoprost (XALATAN) 0.005 % ophthalmic solution Place 1 drop into both eyes at bedtime.    Marland Kitchen losartan (COZAAR) 100 MG tablet Take 1 tablet (100 mg total) by mouth daily. 90 tablet 0  . metoprolol tartrate (LOPRESSOR) 25 MG tablet 1/2 TABLET BY MOUTH AS NEEDED FOR PALPITATIONS 30 tablet 1  . Multiple Vitamins-Minerals (VITEYES AREDS ADVANCED) CAPS Take 1 capsule by mouth daily.    Marland Kitchen omeprazole (PRILOSEC) 40 MG capsule Take 40 mg by mouth daily.    . ondansetron (ZOFRAN ODT) 4 MG disintegrating tablet Take 1 tablet (4 mg total) by mouth every 8 (eight) hours as needed for nausea or vomiting. 30 tablet 0  . oxyCODONE (OXY IR/ROXICODONE) 5 MG immediate release tablet Take 1 tablet (5 mg total) by mouth every 6 (six) hours as needed for moderate pain or severe pain. 40 tablet 0  . Polyethyl Glycol-Propyl Glycol (SYSTANE) 0.4-0.3 % SOLN Apply 1 drop to eye daily as needed (dry eyes).     . polyethylene glycol (MIRALAX / GLYCOLAX) packet Take 17 g by mouth daily as needed for moderate constipation. 30 each 0  . Potassium Chloride ER 20 MEQ TBCR Take 20 mEq by mouth daily. 30 tablet 1  . urea (CARMOL) 10 % cream Apply topically as needed. 71 g 0   Current Facility-Administered Medications  Medication Dose Route Frequency Provider Last Rate Last Dose  . 0.9 %  sodium chloride infusion  250 mL Intravenous Continuous Skeet Latch, MD      . sodium chloride flush (NS) 0.9 % injection 3 mL  3 mL Intravenous Q12H Skeet Latch, MD      . sodium chloride flush (NS) 0.9 % injection 3 mL  3 mL Intravenous PRN Skeet Latch, MD       Facility-Administered Medications  Ordered in Other Visits  Medication Dose Route Frequency Provider Last Rate Last Dose  . 0.9 %  sodium chloride infusion   Intravenous Once Truitt Merle, MD      . panitumumab (VECTIBIX) 500 mg in sodium chloride 0.9 % 100 mL chemo infusion  6 mg/kg (Treatment Plan Recorded) Intravenous Once Truitt Merle, MD        REVIEW OF SYSTEMS:   Constitutional: Denies fevers, chills or abnormal  (+) good appetite  Eyes: Denies blurriness of vision, double vision or watery eyes Ears, nose, mouth, throat, and face: Denies mucositis or sore throat Respiratory: Denies cough, dyspnea or wheezes Cardiovascular: Denies palpitation, chest discomfort  Gastrointestinal:  Denies heartburn  Skin: (+) New rash on chest and back unrelated to Vectibix Lymphatics: Denies new lymphadenopathy or easy bruising MSK: negative Neurological:Denies numbness, tingling or new weaknesses Behavioral/Psych: Mood is stable, no new changes  All other systems were reviewed with the patient and are negative.  PHYSICAL EXAMINATION:  ECOG PERFORMANCE STATUS: 1 - Symptomatic but completely ambulatory  Vitals:   10/31/17 0832  BP: (!) 143/70  Pulse: 71  Resp: 19  Temp: 98.7 F (37.1 C)  SpO2: 98%   Filed Weights   10/31/17 0832  Weight: 175 lb 8 oz (79.6 kg)    GENERAL:alert, no distress and comfortable SKIN: skin color, texture,  turgor are normal (+) a few acne-like rash on face, no visible rash or skin erythema on scalp, dry skin on palms, (+) skin crack on left thumb  EYES: normal, conjunctiva are pink and non-injected, sclera clear OROPHARYNX:no exudate, no erythema and lips, buccal mucosa, and tongue normal  NECK: supple, thyroid normal size, non-tender, without nodularity LYMPH:  no palpable lymphadenopathy in the cervical, axillary or inguinal LUNGS: clear to auscultation and percussion with normal breathing effort HEART: regular rate & rhythm and no murmurs and (+)symmetrical lower extremity edema  ABDOMEN:abdomen  soft, non-tender and normal bowel sounds Musculoskeletal:no cyanosis of digits and no clubbing  PSYCH: alert & oriented x 3 with fluent speech NEURO: no focal motor/sensory deficits   LABORATORY DATA:  I have reviewed the data as listed CBC Latest Ref Rng & Units 10/31/2017 10/16/2017 10/02/2017  WBC 3.9 - 10.3 K/uL 9.3 7.9 8.8  Hemoglobin 11.6 - 15.9 g/dL 12.1 11.9 11.8  Hematocrit 34.8 - 46.6 % 36.6 35.6 35.8  Platelets 145 - 400 K/uL 264 324 324   CMP Latest Ref Rng & Units 10/31/2017 10/16/2017 10/02/2017  Glucose 70 - 140 mg/dL 75 83 71  BUN 7 - 26 mg/dL _0 Creatinine 0.60 - 1.10 mg/dL 0.82 0.91 0.85  Sodium 136 - 145 mmol/L 139 139 139  Potassium 3.5 - 5.1 mmol/L 3.6 3.8 3.4(L)  Chloride 98 - 109 mmol/L 107 105 105  CO2 22 - 29 mmol/L _1 Calcium 8.4 - 10.4 mg/dL 9.5 9.6 9.3  Total Protein 6.4 - 8.3 g/dL 7.5 7.6 7.2  Total Bilirubin 0.2 - 1.2 mg/dL 0.6 0.8 0.5  Alkaline Phos 40 - 150 U/L 108 97 103  AST 5 - 34 U/L _2 ALT 0 - 55 U/L _3 PATHOLOGY: [facility] 11/11/16   Diagnosis 11/11/16 Liver, needle/core biopsy, left lobe - METASTATIC ADENOCARCINOMA. - SEE COMMENT. Microscopic Comment The histologic features are consistent with a primary gastrointestinal adenocarcinoma. Dr. Vicente Males has reviewed the case and concurs with this interpretation. There is likely sufficient tumor present for additional studies, if requested. Enid Cutter MD Pathologist, Electronic Signature (Case signed 11/12/2016)  Diagnosis 09/04/2016 MODERATELY DIFFERENTIATED ADENOCARCINOMA OF COLON AT 20 CM Tumor Site: Colon at 20 cm Tumor Type: Adenocarcinoma Tumore Size: Unknown  Grade: Moderately differentiated   RADIOGRAPHIC STUDIES: I have personally reviewed the radiological images as listed and agreed with the findings in the report.  CT CAP W Contrast 10/22/17 IMPRESSION: 1. Significant interval improvement and liver metastasis. 2. The left perinephric mass is  slightly decreased in size in the interval. 3. No significant interval change in the appearance of multiple pulmonary metastasis. 4. Stable appearance of mediastinal adenopathy. 5. Gallstones 6.  Aortic Atherosclerosis (ICD10-I70.0). 7. Colonic stent remains in place without evidence for bowel obstruction.  CT AP W Contrast 08/07/17 IMPRESSION: Low colonic obstruction secondary to a rectosigmoid stricture at the site of the patient's prior colonic mass, which is no longer evident on CT. Mild upstream colonic wall thickening raises the possibility of superimposed colitis.Improving hepatic metastases, as above. Left perirenal metastasis, decreased. Visualized pulmonary metastases at the lung bases are grossly unchanged. 3.5 cm mixed cystic/solid left ovarian mass is mildly decreased.   CT Chest 06/06/17  IMPRESSION: 1. There are multiple new and enlarging pulmonary nodules throughout the lungs bilaterally. Additionally, a few of the nodules have decreased in size when compared to prior. 2. Interval increase in size of precarinal lymph  node. 3. Interval increase in size of hepatic lesions. 4. Aortic Atherosclerosis (ICD10-I70.0).  CT Abdomen Pelvis, 05/19/2017 IMPRESSION: 1. Worsening pulmonary and hepatic metastatic disease. Primary sigmoid lesion is stable to minimally enlarged. 2. Stable to slight enlargement of a left perinephric mass, likely a metastasis. Renal cell carcinoma is not excluded. Associated pelvocaliectasis versus mild hydronephrosis. 3. Borderline left periaortic lymph nodes, slightly enlarged. 4. Cholelithiasis. 5.  Aortic atherosclerosis (ICD10-170.0). 6. Cystic and solid lesion in the left adnexa, as on prior exams, likely ovarian in origin.  PET 11/06/2016 IMPRESSION: 1. Hypermetabolic pulmonary and hepatic lesions, most consistent with metastatic colon cancer, with the hypermetabolic primary located in the sigmoid colon. 2. Left renal and left adnexal  lesions, better seen and evaluated on 10/15/2016. 3. Persistent moderate left hydronephrosis. 4. Aortic atherosclerosis (ICD10-170.0).  MRI Abdomen Pelvis w/wo Contrast 10/15/2016 IMPRESSION: 1. There are 2 enhancing hepatic lesions highly worrisome for metastatic disease, likely metastatic colon cancer. 2. The lesion of concern in the anterior suprahilar lip of the left kidney is also enhancing, worrisome for neoplasm. This has a somewhat atypical appearance for renal cell carcinoma and could reflect a metastasis as well. 3. New left-sided hydroureter with delayed contrast excretion consistent with distal ureteral obstruction. Specific etiology uncertain. 4. Complex cystic and solid left adnexal lesion could reflect cystic neoplasm, although does not appear aggressive or appear to reflect the cause of the distal left ureteral obstruction. 5. Sigmoid colon wall thickening could be related to reported newly diagnosed colon cancer.  CT CAP w Contrast 09/06/2016 (done outside) IMPRESSION: 1. No acute abdominopelvic process.  2. Heterogenous hypodense area involving the medial edge of the left kidney with multiple small soft tissue like stranding involving the left pararenal space. Findings are suspicious for malignancy of the left kidney with small pararenal metastases. MRI abdomen with contrast should be considered to further evaluate.  3. Vague hypodensity involving the lateral segment of the liver. This can also be reevaluated with MRI.  4. Partially visualized 5 mm posterior right lower lobe nodular density. CT chest without contrast follow up is recommended.  5. Complex multilobular left ovarian lesion may reflect a cluster of cysts.    PROCEDURES  Flexible Sigmoidoscopy 08/11/17 IMPRESSION:   - Preparation of the colon was fair. - Likely malignant partially obstructing tumor in the recto-sigmoid colon versus necrotic scarring. Injected with contrast proximal to obstruction to  confirm proper position. Prosthesis placed. - Stool in the rectum. - No specimens collected.   Echocardiogram 11/03/16 - Left ventricle: The cavity size was normal. Wall thickness was   increased in a pattern of mild LVH. Indeterminate diastolic   function (atrial fibrillation). Systolic function was normal. The   estimated ejection fraction was in the range of 60% to 65%.   Operative Report by Dr. Tresa Moore on 10/10/16 Procedure: 1) Cystoscopy, left retrograde pyelogram interpretation. 2) Left diagnostic uteroscopy. 3) Insertion of left uteral stent; 5 x 24 Polaris with tether. Findings: 1) Unremarkable bladder. 2) Unremarkable left retrograde pyelogram. 3) No evidence of intraluminal urothelial neoplasm whatsoever with inspection of the left kidney and ureter times several. 4) Successful placement of left ureteral stent, proximal in renal pelvis and distal in urinary bladder.  Colonoscopy 09/02/2016 FINDINGS: Diverticulosis (scattered). Pt has about 10 cm structure starting at 20 cm with abnormal mucosa. The colonoscopy scope was not able to advance due to structure, and upper endoscopy scope was used and it was advanced through the stricture to see ileocecal valve. Biopsies at the stricture were  taken.   ASSESSMENT & PLAN:  Lashayla is a 84 y.o. female   1. Adenocarcinoma of left colon, sigmoid colon, TxNxM1 with liver and lung mets, stage IV, KRAS/NRS wild type, MSI-stable  -I previously discussed the imaging, colonoscopy and biopsy results with the patient and her family in detail.  -She has seen Drs. Manny and Gross, surgical resections were discussed.  -PET scan on 11/06/16 showed hypermetabolic pulmonary and hepatic lesions consistent with metastatic colon cancer with the primary located in the sigmoid colon. I reviewed the images with pt and her family members in person  -US biopsy on 11/11/16 of the left lobe of the liver revealed metastatic adenocarcinoma. The features are consistent  with a primary gastrointestinal adenocarcinoma. I discussed with pt  -We previously discussed that her metastatic cancer is incurable at this stage due to the multiorgan metastasis, and her advanced age. The goal of therapy is palliative. -We discussed the indication for surgery, including significant bleeding, obstruction, or perforation. She does have mild constipation and intermittent abdominal cramps, but no significant bowel obstruction. Patient understands the surgery is palliative. She prefers to avoid surgery at this point.  -We reviewed [facility] test results, which showed MSI- stable, no KRAS/NRAS or BRAF mutations. Based on this, she is not a candidate for immunotherapy, but she will likely benefit from EGFR inhibitor, giving the left side colon cancer. --The [facility] results showed she is not a candidate for immunotherapy, but she is a candidate for EGFR antibody therapy. -We discussed that the prognosis of left side colon cancer without KRAS/NRAS and BRAF mutations is much better than right side colon cancer, especially with EGFR antibody treatment. -We discussed the options of systemic therapy. Due to her advanced age, she is not a candidate for intensive chemotherapy. I recommend that panitumumab, an EGFR inhibitor with low intensity chemo (5-FU infusion or Xeloda to control her cancer. -We discussed alternative management options with palliative care alone, which will be also appropriate given her advanced age and medical comorbidities. -She has recently developed worsening abdominal pain, constipation and nausea, concerning for bowel obstruction from her sigmoid colon cancer. I recommended she increase Miralax and Colace.  - I recommended low residual, low fiber diet, she is tolerating well, her nausea and abdominal discomfort has improved -I reviewed her CT abdomen and pelvis from 05/19/17, which showed disease progression in liver and lung, the primary sigmoid colon mass is  stable, no imaging evidence of bowel obstruction. -On 06/06/17 she started first line Vectibix every [redacted] weeks along with oral chemo Xeloda for 1 week on and 1 week off. She has tolerated well overall, except mild skin rash -She was hospitalized on 08/07/17 for bowel obstruction. She had a sigmoid colon stent placement on 08/11/17 to resolve this. She has recovered well  -Her 08/07/17 CT AP showed great partial response to chemotherapy, no new mets. Given her excellent tolerance to treatment and good response I previously discussed continuing with chemotherapy.  Certainly given her advanced age, if she has more side effects, will likely stop her treatment and switch to comfort care.  We previously had a long conversation about this, and discussed the goal of care again.  Pt finally decided to continue with treatment  -CT CAP from 10/22/17 revealed continue interval improvement in liver and lung metastasis, no new lesions. I discussed these results with the pt today and reviewed the images in person. She is responding well to treatment and I recommend her to continue. She agreed to  proceed with treatment.  -She is having a good response to treatment, but does have skin toxicities, will reduce Xeloda to 2014m in the AM and 15043min PM to reduce her hand foot syndrome.  -Labs reviewed, CBC otherwise normal, CMP pending, will call with any abnormal results. Last CEA in normal range, CEA pending today -Repeat scan in 3 months  -She knows to call [place] any abnormal symptoms or concerns  -f/u in 4 weeks   2. Left renal mass and left hydronephrosis -Possible renal cell carcinoma, metastatic lesion from colon cancer is also a possibility. -She has been seen by [facility], Dr. MaTresa Mooreand had left ureter stent placement on 10/10/2016.  -no signs of hydronephrosis on recent CT scan, and a left renal mass is stable.  3. Complex cyst of left ovary -will monitor. Likely benign -If she undergo abdominal surgery, may  remove it -She declines surgery for now -08/07/17 CT shows mild decrease in size of cyst  4. Atrial Fibrillation -The patient has a history of atrial fibrillation. -Her cardiologist wants her to undergo cardio inversion and anticoagulation. -I previously advised the patient that even if she undergoes cardio inversion, her Afib might return. Also, her anticoagulation medication might make her bleeding worse. -The patient should continue taking baby aspirin.  5. HTN -I previously advised the patient to continue medications and follow up with primary care physician  6. Nutrition -We previously discussed palliative chemotherapy will make her fatigued, nauseous, and loss of appetite. -I previously stressed the importance of a healthy diet with plenty of protein. -I previously referred to Nutrition.  -Her eating has improved.   7. Left flank and abdominal pain  -The patient is taking Aleve for pain. -I prescribed Tylenol #3 on 11/13/16. -The patient discontinued Tylenol #3 due to chest discomfort and headaches. -she may try tramadol, she is able to tolerate tramadol -Her abdominal pain, much worse due to the bowel obstruction in late December 2018, I have started her on Percocet, she is taking 1-2 tablets a day, pain overall much improved and controlled  -She is currently not using percocet, her pain is manageable with tramadol as needed  8. Goal of care discussion  -We previously discussed the incurable nature of her cancer, and the overall poor prognosis, especially if she does not have good response to chemotherapy or progress on chemo -The patient understands the goal of care is palliative. -I previously recommended DNR/DNI, she will think about it   9. Skin cracks on finger tips, Facial Acne type rash, secondary to chemo -Continue hydrocortisone and clindamycin gel for acneiform rash, which is very mild overall. -Eucerin, oatmeal bath, po benadryl PRN and Claritin for body  itching -For her hands I previously suggested a vinegar and water soak along with daily neosporin around her nails. I previously recommend she use lotion after she washes her hands and advices her to wear cotton glove to protect her skin. -Her previous Vectibix rash is resolved.  -Prescribed Urea cream for skin dryness and cracking on her hands. I advised her to wear gloves when doing things with her hands  10. SBO, s/p sigmoid stent placement  -Hospitalized on 08/07/17, and had stent placed on 08/11/17.  -Now resolved.  -She is on low residual diet   Plan  -Reviewed CT CAP with her today, she had a good response to treatment  -Labs reviewed, proceed with cycle 9 vectibix and xeloda, and continue every 2 weeks  -Reduce Xeloda 2000 mg in the AM  and 1530m in the PM 1 week on and 1 week off -prescribed 100 mg Colace and urea cream today -F/u in 4 weeks    All questions were answered. The patient knows to call the clinic with any problems, questions or concerns. I spent a total of 25 minutes for her visit, more than 50% face-to-face counseling.  This document serves as a record of services personally performed by YTruitt Merle MD. It was created on her behalf by DTheresia Bough a trained medical scribe. The creation of this record is based on the scribe's personal observations and the provider's statements to them.   I have reviewed the above documentation for accuracy and completeness, and I agree with the above.    YTruitt Merle MD 10/31/2017 9:18 AM

## 2017-10-30 ENCOUNTER — Other Ambulatory Visit: Payer: Medicare Other

## 2017-10-30 ENCOUNTER — Ambulatory Visit: Payer: Medicare Other

## 2017-10-30 ENCOUNTER — Ambulatory Visit: Payer: Medicare Other | Admitting: Nurse Practitioner

## 2017-10-31 ENCOUNTER — Encounter: Payer: Self-pay | Admitting: Hematology

## 2017-10-31 ENCOUNTER — Inpatient Hospital Stay: Payer: Medicare Other

## 2017-10-31 ENCOUNTER — Telehealth: Payer: Self-pay | Admitting: Hematology

## 2017-10-31 ENCOUNTER — Inpatient Hospital Stay (HOSPITAL_BASED_OUTPATIENT_CLINIC_OR_DEPARTMENT_OTHER): Payer: Medicare Other | Admitting: Hematology

## 2017-10-31 VITALS — BP 143/70 | HR 71 | Temp 98.7°F | Resp 19 | Ht 66.0 in | Wt 175.5 lb

## 2017-10-31 DIAGNOSIS — K5900 Constipation, unspecified: Secondary | ICD-10-CM | POA: Diagnosis not present

## 2017-10-31 DIAGNOSIS — I4891 Unspecified atrial fibrillation: Secondary | ICD-10-CM

## 2017-10-31 DIAGNOSIS — N83202 Unspecified ovarian cyst, left side: Secondary | ICD-10-CM | POA: Diagnosis not present

## 2017-10-31 DIAGNOSIS — C7800 Secondary malignant neoplasm of unspecified lung: Secondary | ICD-10-CM

## 2017-10-31 DIAGNOSIS — C787 Secondary malignant neoplasm of liver and intrahepatic bile duct: Secondary | ICD-10-CM | POA: Diagnosis not present

## 2017-10-31 DIAGNOSIS — C187 Malignant neoplasm of sigmoid colon: Secondary | ICD-10-CM

## 2017-10-31 DIAGNOSIS — N289 Disorder of kidney and ureter, unspecified: Secondary | ICD-10-CM | POA: Diagnosis not present

## 2017-10-31 DIAGNOSIS — Z7189 Other specified counseling: Secondary | ICD-10-CM

## 2017-10-31 DIAGNOSIS — C186 Malignant neoplasm of descending colon: Secondary | ICD-10-CM

## 2017-10-31 DIAGNOSIS — I10 Essential (primary) hypertension: Secondary | ICD-10-CM | POA: Diagnosis not present

## 2017-10-31 DIAGNOSIS — Z5112 Encounter for antineoplastic immunotherapy: Secondary | ICD-10-CM | POA: Diagnosis not present

## 2017-10-31 LAB — CBC WITH DIFFERENTIAL/PLATELET
BASOS ABS: 0.1 10*3/uL (ref 0.0–0.1)
BASOS PCT: 1 %
EOS ABS: 0.3 10*3/uL (ref 0.0–0.5)
Eosinophils Relative: 3 %
HCT: 36.6 % (ref 34.8–46.6)
Hemoglobin: 12.1 g/dL (ref 11.6–15.9)
Lymphocytes Relative: 15 %
Lymphs Abs: 1.4 10*3/uL (ref 0.9–3.3)
MCH: 31.7 pg (ref 25.1–34.0)
MCHC: 33 g/dL (ref 31.5–36.0)
MCV: 96.1 fL (ref 79.5–101.0)
MONO ABS: 1.3 10*3/uL — AB (ref 0.1–0.9)
MONOS PCT: 14 %
NEUTROS PCT: 67 %
Neutro Abs: 6.2 10*3/uL (ref 1.5–6.5)
Platelets: 264 10*3/uL (ref 145–400)
RBC: 3.81 MIL/uL (ref 3.70–5.45)
RDW: 18.2 % — AB (ref 11.2–14.5)
WBC: 9.3 10*3/uL (ref 3.9–10.3)

## 2017-10-31 LAB — COMPREHENSIVE METABOLIC PANEL
ALK PHOS: 108 U/L (ref 40–150)
ALT: 9 U/L (ref 0–55)
ANION GAP: 8 (ref 3–11)
AST: 15 U/L (ref 5–34)
Albumin: 3.4 g/dL — ABNORMAL LOW (ref 3.5–5.0)
BUN: 11 mg/dL (ref 7–26)
CALCIUM: 9.5 mg/dL (ref 8.4–10.4)
CO2: 24 mmol/L (ref 22–29)
CREATININE: 0.82 mg/dL (ref 0.60–1.10)
Chloride: 107 mmol/L (ref 98–109)
GFR calc non Af Amer: >60 mL/min (ref >60)
GLUCOSE: 75 mg/dL (ref 70–140)
Potassium: 3.6 mmol/L (ref 3.5–5.1)
SODIUM: 139 mmol/L (ref 136–145)
TOTAL PROTEIN: 7.5 g/dL (ref 6.4–8.3)
Total Bilirubin: 0.6 mg/dL (ref 0.2–1.2)

## 2017-10-31 LAB — MAGNESIUM: Magnesium: 2.1 mg/dL (ref 1.5–2.5)

## 2017-10-31 LAB — CEA (IN HOUSE-CHCC): CEA (CHCC-IN HOUSE): 3.22 ng/mL (ref 0.00–5.00)

## 2017-10-31 MED ORDER — DOCUSATE SODIUM 100 MG PO CAPS: 100.0000 mg | ORAL_CAPSULE | Freq: Two times a day (BID) | ORAL | 2 refills | Status: DC

## 2017-10-31 MED ORDER — UREA 10 % EX CREA: TOPICAL_CREAM | CUTANEOUS | 0 refills | Status: DC | PRN

## 2017-10-31 MED ORDER — SODIUM CHLORIDE 0.9 % IV SOLN
Freq: Once | INTRAVENOUS | Status: AC
  Administered 2017-10-31: 09:00:00 via INTRAVENOUS

## 2017-10-31 MED ORDER — SODIUM CHLORIDE 0.9 % IV SOLN
6.0000 mg/kg | Freq: Once | INTRAVENOUS | Status: AC
  Administered 2017-10-31: 500 mg via INTRAVENOUS
  Filled 2017-10-31: qty 20

## 2017-10-31 NOTE — Telephone Encounter (Signed)
Appointments scheduled  Patient io pick up new scheduled next week per 3/22 los

## 2017-10-31 NOTE — Patient Instructions (Signed)
Cancer Center Discharge Instructions for Patients Receiving Chemotherapy  Today you received the following chemotherapy agents: Vectibix.  To help prevent nausea and vomiting after your treatment, we encourage you to take your nausea medication as directed.   If you develop nausea and vomiting that is not controlled by your nausea medication, call the clinic.   BELOW ARE SYMPTOMS THAT SHOULD BE REPORTED IMMEDIATELY:  *FEVER GREATER THAN 100.5 F  *CHILLS WITH OR WITHOUT FEVER  NAUSEA AND VOMITING THAT IS NOT CONTROLLED WITH YOUR NAUSEA MEDICATION  *UNUSUAL SHORTNESS OF BREATH  *UNUSUAL BRUISING OR BLEEDING  TENDERNESS IN MOUTH AND THROAT WITH OR WITHOUT PRESENCE OF ULCERS  *URINARY PROBLEMS  *BOWEL PROBLEMS  UNUSUAL RASH Items with * indicate a potential emergency and should be followed up as soon as possible.  Feel free to call the clinic should you have any questions or concerns. The clinic phone number is [phone].  Please show the CHEMO ALERT CARD at check-in to the Emergency Department and triage nurse.   

## 2017-11-07 ENCOUNTER — Telehealth: Payer: Self-pay | Admitting: *Deleted

## 2017-11-07 ENCOUNTER — Emergency Department (HOSPITAL_COMMUNITY): Payer: Medicare Other

## 2017-11-07 ENCOUNTER — Other Ambulatory Visit: Payer: Self-pay

## 2017-11-07 ENCOUNTER — Encounter (HOSPITAL_COMMUNITY): Payer: Self-pay | Admitting: *Deleted

## 2017-11-07 ENCOUNTER — Emergency Department (HOSPITAL_COMMUNITY)
Admission: EM | Admit: 2017-11-07 | Discharge: 2017-11-07 | Disposition: A | Discharge status: Home / Self Care | Payer: Medicare Other | Attending: Emergency Medicine | Admitting: Emergency Medicine

## 2017-11-07 DIAGNOSIS — Z79899 Other long term (current) drug therapy: Secondary | ICD-10-CM | POA: Diagnosis not present

## 2017-11-07 DIAGNOSIS — R1030 Lower abdominal pain, unspecified: Secondary | ICD-10-CM | POA: Diagnosis not present

## 2017-11-07 DIAGNOSIS — R630 Anorexia: Secondary | ICD-10-CM | POA: Insufficient documentation

## 2017-11-07 DIAGNOSIS — C787 Secondary malignant neoplasm of liver and intrahepatic bile duct: Secondary | ICD-10-CM | POA: Diagnosis not present

## 2017-11-07 DIAGNOSIS — I10 Essential (primary) hypertension: Secondary | ICD-10-CM | POA: Insufficient documentation

## 2017-11-07 DIAGNOSIS — C187 Malignant neoplasm of sigmoid colon: Secondary | ICD-10-CM | POA: Diagnosis not present

## 2017-11-07 DIAGNOSIS — R109 Unspecified abdominal pain: Secondary | ICD-10-CM | POA: Diagnosis not present

## 2017-11-07 DIAGNOSIS — Z5112 Encounter for antineoplastic immunotherapy: Secondary | ICD-10-CM | POA: Diagnosis not present

## 2017-11-07 DIAGNOSIS — C189 Malignant neoplasm of colon, unspecified: Secondary | ICD-10-CM | POA: Diagnosis not present

## 2017-11-07 DIAGNOSIS — C7800 Secondary malignant neoplasm of unspecified lung: Secondary | ICD-10-CM | POA: Diagnosis not present

## 2017-11-07 LAB — URINALYSIS, ROUTINE W REFLEX MICROSCOPIC
BILIRUBIN URINE: NEGATIVE
Glucose, UA: NEGATIVE mg/dL
Hgb urine dipstick: NEGATIVE
KETONES UR: NEGATIVE mg/dL
Leukocytes, UA: NEGATIVE
Nitrite: NEGATIVE
PH: 5 (ref 5.0–8.0)
Protein, ur: 30 mg/dL — AB
Specific Gravity, Urine: 1.019 (ref 1.005–1.030)

## 2017-11-07 LAB — COMPREHENSIVE METABOLIC PANEL
ALBUMIN: 3.7 g/dL (ref 3.5–5.0)
ALK PHOS: 171 U/L — AB (ref 38–126)
ALT: 65 U/L — ABNORMAL HIGH (ref 14–54)
AST: 90 U/L — ABNORMAL HIGH (ref 15–41)
Anion gap: 11 (ref 5–15)
BUN: 11 mg/dL (ref 6–20)
CALCIUM: 9.3 mg/dL (ref 8.9–10.3)
CHLORIDE: 104 mmol/L (ref 101–111)
CO2: 24 mmol/L (ref 22–32)
CREATININE: 0.79 mg/dL (ref 0.44–1.00)
GFR calc Af Amer: >60 mL/min (ref >60)
GFR calc non Af Amer: >60 mL/min (ref >60)
GLUCOSE: 112 mg/dL — AB (ref 65–99)
Potassium: 3.7 mmol/L (ref 3.5–5.1)
SODIUM: 139 mmol/L (ref 135–145)
Total Bilirubin: 1.5 mg/dL — ABNORMAL HIGH (ref 0.3–1.2)
Total Protein: 7.5 g/dL (ref 6.5–8.1)

## 2017-11-07 LAB — CBC
HCT: 35.6 % — ABNORMAL LOW (ref 36.0–46.0)
HEMOGLOBIN: 11.6 g/dL — AB (ref 12.0–15.0)
MCH: 31.8 pg (ref 26.0–34.0)
MCHC: 32.6 g/dL (ref 30.0–36.0)
MCV: 97.5 fL (ref 78.0–100.0)
PLATELETS: 367 10*3/uL (ref 150–400)
RBC: 3.65 MIL/uL — ABNORMAL LOW (ref 3.87–5.11)
RDW: 16.5 % — ABNORMAL HIGH (ref 11.5–15.5)
WBC: 8 10*3/uL (ref 4.0–10.5)

## 2017-11-07 LAB — LIPASE, BLOOD: LIPASE: 40 U/L (ref 11–51)

## 2017-11-07 MED ORDER — SODIUM CHLORIDE 0.9 % IV BOLUS
1000.0000 mL | Freq: Once | INTRAVENOUS | Status: AC
  Administered 2017-11-07: 1000 mL via INTRAVENOUS

## 2017-11-07 MED ORDER — ONDANSETRON HCL 4 MG/2ML IJ SOLN
4.0000 mg | Freq: Once | INTRAMUSCULAR | Status: AC
Start: 2017-11-07 — End: 2017-11-07
  Administered 2017-11-07: 4 mg via INTRAVENOUS
  Filled 2017-11-07: qty 2

## 2017-11-07 MED ORDER — MORPHINE SULFATE (PF) 4 MG/ML IV SOLN
4.0000 mg | Freq: Once | INTRAVENOUS | Status: AC
  Administered 2017-11-07: 4 mg via INTRAVENOUS
  Filled 2017-11-07: qty 1

## 2017-11-07 MED ORDER — IOPAMIDOL (ISOVUE-300) INJECTION 61%
INTRAVENOUS | Status: AC
  Filled 2017-11-07: qty 100

## 2017-11-07 MED ORDER — IOPAMIDOL (ISOVUE-300) INJECTION 61%
100.0000 mL | Freq: Once | INTRAVENOUS | Status: AC | PRN
  Administered 2017-11-07: 100 mL via INTRAVENOUS

## 2017-11-07 NOTE — Telephone Encounter (Signed)
Daughter in-law [name] called and left message about pt having bowel obstruction symptoms again.  Requested a call back from nurse.   Spoke with Colletta Maryland, and was informed that pt experiencing same symptoms for bowel obstruction as before : Unable to have BMs for 2 days, had nausea/vomiting, increased abdominal pain - not relieved by pain meds. Dr. Burr Medico notified.  Spoke with Colletta Maryland, and instructed her to take pt to [facility] ER now if pt can tolerate the drive, if not, go to the nearest ER ( pt lives in [place] ).  Colletta Maryland voiced understanding, and stated she would take pt to [facility] ER.

## 2017-11-07 NOTE — ED Provider Notes (Signed)
[facility] DEPT Provider Note   CSN: [id number] Arrival date & time: 11/07/17  1329     History   Chief Complaint Chief Complaint  Patient presents with  . Abdominal Pain  . Nausea  . Constipation    HPI Mary [name] is a 84 y.o. female.  Mary [name] is a 84 y.o. Female with history of colon cancer currently receiving treatment with Xeloda and Vectibix,  HTN, HLD, Afib, and GERD, who presents to the ED for evaluation of 5 days of constipation and cramping abdominal pain.  Patient reports pain initially started Saturday, but has gotten worse patient reports pain is intermittent but when it comes its very intense and feels like sharp cramping pains usually in her lower abdomen.  Patient reports one episode of vomiting on Saturday and she has been nauseous and had very little appetite.  Patient reports a few very small insignificant bowel movements earlier in the week but she has had no bowel movements at all for the past few days unsure if she is passing gas.  Patient reports that this feels very similar to the symptoms she had in December when she had her bowel obstruction and had to have stent placed.  Patient has been using home oxycodone prescribed by her oncologist with minor improvement.  Patient has been using a bowel regimen of MiraLAX and stool softeners daily.  Patient denies any fevers or chills, no urinary symptoms.  Patient denies any chest pain or shortness of breath no coughing or URI symptoms.     Past Medical History:  Diagnosis Date  . Aneurysm of ascending aorta (HCC) 12/03/2016   3.7 cm by echo 10/2016  . Colon cancer [facility])    dx via biospy from colonoscopy-- malignant ---  currently having work-up done w/ dr gross (general surgeon) and coordinate surgery w/ urologsit for renal tumor  . Essential hypertension 10/31/2016  . GERD (gastroesophageal reflux disease)   . Glaucoma   . Hiatal hernia   . HOH (hard of hearing)    bilateral  even w/ hearing aids  . Hyperlipidemia   . Kidney tumor    left side  . Legally blind in right eye, as defined in [place]    due to macular degeneration  . Macular degeneration   . Nausea    intermittant due to colon mass  . Ovarian cyst   . Persistent atrial fibrillation ([place]) 10/31/2016   on ASA only  . PONV (postoperative nausea and vomiting)   . Wears glasses   . Wears hearing aid    bilateral    Patient Active Problem List   Diagnosis Date Noted  . Colon stricture ([place])   . Diarrhea   . Hyperglycemia 08/08/2017  . Rectosigmoid stricture from obstructing cancer s/p endoscopic stenting 08/11/2017 08/07/2017  . Bowel obstruction ([place]) 08/07/2017  . Goals of care, counseling/discussion 12/05/2016  . Aneurysm of ascending aorta (HCC) 12/03/2016  . Persistent atrial fibrillation ([place]) 10/31/2016  . Essential hypertension 10/31/2016  . Hyperlipidemia 10/31/2016  . Cancer of sigmoid colon ([place]) 10/25/2016    Past Surgical History:  Procedure Laterality Date  . BREAST CYST EXCISION  1990   benign  . COLONOSCOPY  09/04/2016  . CYSTOSCOPY WITH RETROGRADE PYELOGRAM, URETEROSCOPY AND STENT PLACEMENT Left 10/10/2016   Procedure: CYSTOSCOPY WITH RETROGRADE PYELOGRAM, DIAGNOSTIC URETEROSCOPY  AND STENT PLACEMENT;  Surgeon: Alexis Frock, MD;  Location: [facility];  Service: Urology;  Laterality: Left;  . ESOPHAGOGASTRODUODENOSCOPY  11/07/2014  .  FLEXIBLE SIGMOIDOSCOPY N/A 08/11/2017   Procedure: FLEXIBLE SIGMOIDOSCOPY;  Surgeon: Clarene Essex, MD;  Location: [facility] END[facility]COPY;  Service: Endoscopy;  Laterality: N/A;  With Fluoroscopy for colonic stent placement for colonic stricture  . NASAL SINUS SURGERY    . TONSILLECTOMY       OB History   None      Home Medications    Prior to Admission medications   Medication Sig Start Date End Date Taking? Authorizing Provider  amLODipine (NORVASC) 5 MG tablet Take 1 tablet (5 mg total) by mouth daily. 03/04/17  Yes Skeet Latch, MD  capecitabine (XELODA) 500 MG tablet Take 4 tablets (2,000 mg total) by mouth 2 (two) times daily after a meal. Take for 7 days then off 7 days 10/29/17  Yes Truitt Merle, MD  clindamycin (CLINDAGEL) 1 % gel Apply topically 2 (two) times daily. 07/24/17  Yes Truitt Merle, MD  diltiazem (CARDIZEM CD) 240 MG 24 hr capsule Take 1 capsule (240 mg total) by mouth daily. 05/28/17  Yes Skeet Latch, MD  docusate sodium (COLACE) 100 MG capsule Take 1 capsule (100 mg total) by mouth 2 (two) times daily. 10/31/17  Yes Truitt Merle, MD  escitalopram (LEXAPRO) 10 MG tablet Take 10 mg by mouth daily.   Yes [provider]  fexofenadine (ALLEGRA) 180 MG tablet Take 180 mg by mouth daily.   Yes [provider]  furosemide (LASIX) 40 MG tablet Take 1 tablet (40 mg total) by mouth daily. 09/29/17  Yes Skeet Latch, MD  latanoprost (XALATAN) 0.005 % ophthalmic solution Place 1 drop into both eyes at bedtime.   Yes [provider]  losartan (COZAAR) 100 MG tablet Take 1 tablet (100 mg total) by mouth daily. 09/29/17  Yes Skeet Latch, MD  Multiple Vitamins-Minerals (VITEYES AREDS ADVANCED) CAPS Take 1 capsule by mouth daily.   Yes [provider]  omeprazole (PRILOSEC) 40 MG capsule Take 40 mg by mouth daily.   Yes [provider]  ondansetron (ZOFRAN ODT) 4 MG disintegrating tablet Take 1 tablet (4 mg total) by mouth every 8 (eight) hours as needed for nausea or vomiting. 07/09/17  Yes Truitt Merle, MD  oxyCODONE (OXY IR/ROXICODONE) 5 MG immediate release tablet Take 1 tablet (5 mg total) by mouth every 6 (six) hours as needed for moderate pain or severe pain. 09/18/17  Yes Alla Feeling, NP  Polyethyl Glycol-Propyl Glycol (SYSTANE) 0.4-0.3 % SOLN Apply 1 drop to eye daily as needed (dry eyes).    Yes [provider]  polyethylene glycol (MIRALAX / GLYCOLAX) packet Take 17 g by mouth daily as needed for moderate constipation. 08/13/17  Yes Bonnielee Haff,  MD  metoprolol tartrate (LOPRESSOR) 25 MG tablet 1/2 TABLET BY MOUTH AS NEEDED FOR PALPITATIONS Patient not taking: Reported on 11/07/2017 07/08/17   Skeet Latch, MD  Potassium Chloride ER 20 MEQ TBCR Take 20 mEq by mouth daily. Patient not taking: Reported on 11/07/2017 09/04/17   Truitt Merle, MD  urea (CARMOL) 10 % cream Apply topically as needed. Patient not taking: Reported on 11/07/2017 10/31/17   Truitt Merle, MD    Family History Family History  Problem Relation Age of Onset  . Cancer Sister 95       breast cancer   . Cancer Brother 68       lung cancer   . Diabetes Son   . High blood pressure Son   . Peripheral Artery Disease Mother   . High blood pressure Mother  Social History Social History   Tobacco Use  . Smoking status: Never Smoker  . Smokeless tobacco: Never Used  Substance Use Topics  . Alcohol use: No  . Drug use: No     Allergies   Benazepril; Tylenol [acetaminophen]; and Bactrim [sulfamethoxazole-trimethoprim]   Review of Systems Review of Systems  Constitutional: Negative for chills and fever.  HENT: Negative for congestion, rhinorrhea and sore throat.   Eyes: Negative for visual disturbance.  Respiratory: Negative for cough and shortness of breath.   Cardiovascular: Negative for chest pain.  Gastrointestinal: Positive for abdominal pain and constipation. Negative for abdominal distention, blood in stool, diarrhea, nausea, rectal pain and vomiting.  Genitourinary: Negative for dysuria, flank pain, frequency and hematuria.  Musculoskeletal: Negative for arthralgias and myalgias.  Skin: Negative for color change and rash.  Neurological: Negative for weakness, light-headedness and numbness.     Physical Exam Updated Vital Signs BP (!) 117/53   Pulse 73   Temp 98.6 F (37 C) (Oral)   Resp 18   SpO2 92%   Physical Exam  Constitutional: She appears well-developed and well-nourished.  Non-toxic appearance. No distress.  HENT:  Head:  Normocephalic and atraumatic.  Oropharynx is clear mucous membranes slightly dry.  Eyes: Pupils are equal, round, and reactive to light. EOM are normal. Right eye exhibits no discharge. Left eye exhibits no discharge.  Cardiovascular: Normal rate, regular rhythm, normal heart sounds and intact distal pulses.  Pulmonary/Chest: Effort normal and breath sounds normal. No respiratory distress. She has no wheezes. She has no rhonchi. She has no rales.  Respirations equal and unlabored, patient able to speak in full sentences, lungs clear to auscultation bilaterally  Abdominal: Soft. Normal appearance and bowel sounds are normal. There is no hepatosplenomegaly. There is tenderness. There is guarding. There is no rebound, no CVA tenderness and no tenderness at McBurney's point.  Abdomen is soft and nondistended bowel sounds are present throughout although more prominent in the upper abdomen, mild tenderness with guarding in the lower abdomen diffusely, no upper abdominal tenderness, no peritoneal signs  no CVA tenderness  Neurological: She is alert. Coordination normal.  Skin: Skin is warm and dry. Capillary refill takes less than 2 seconds. She is not diaphoretic.  Psychiatric: She has a normal mood and affect. Her behavior is normal.  Nursing note and vitals reviewed.    ED Treatments / Results  Labs (all labs ordered are listed, but only abnormal results are displayed) Labs Reviewed  COMPREHENSIVE METABOLIC PANEL - Abnormal; Notable for the following components:      Result Value   Glucose, Bld 112 (*)    AST 90 (*)    ALT 65 (*)    Alkaline Phosphatase 171 (*)    Total Bilirubin 1.5 (*)    All other components within normal limits  CBC - Abnormal; Notable for the following components:   RBC 3.65 (*)    Hemoglobin 11.6 (*)    HCT 35.6 (*)    RDW 16.5 (*)    All other components within normal limits  URINALYSIS, ROUTINE W REFLEX MICROSCOPIC - Abnormal; Notable for the following  components:   Color, Urine AMBER (*)    APPearance HAZY (*)    Protein, ur 30 (*)    Bacteria, UA RARE (*)    Squamous Epithelial / LPF 0-5 (*)    All other components within normal limits  LIPASE, BLOOD    EKG EKG Interpretation  Date/Time:  Friday November 07 2017 17:02:15  EDT Ventricular Rate:  74 PR Interval:    QRS Duration: 108 QT Interval:  418 QTC Calculation: 464 R Axis:   -45 Text Interpretation:  Atrial fibrillation Left anterior fascicular block Low voltage, precordial leads Borderline T wave abnormalities No significant change since last tracing Confirmed by Dorie Rank [phone]) on 11/07/2017 5:42:03 PM   Radiology Ct Abdomen Pelvis W Contrast  Result Date: 11/07/2017 CLINICAL DATA:  Abdominal pain.  History of colon cancer. EXAM: CT ABDOMEN AND PELVIS WITH CONTRAST TECHNIQUE: Multidetector CT imaging of the abdomen and pelvis was performed using the standard protocol following bolus administration of intravenous contrast. CONTRAST:  17mL ISOVUE-300 IOPAMIDOL (ISOVUE-300) INJECTION 61% COMPARISON:  10/22/2017 FINDINGS: LOWER CHEST: 2.2 cm nodule at the left lung base. This is unchanged in size. A right basilar nodule measuring 10 mm is also unchanged in size. HEPATOBILIARY: Unchanged appearance of 2 hepatic metastases, 1 at the hepatic dome and 1 at the tip of the left hepatic lobe. Cholelithiasis without acute inflammation. PANCREAS: Normal parenchymal contours without ductal dilatation. No peripancreatic fluid collection. SPLEEN: Normal. ADRENALS/URINARY TRACT: --Adrenal glands: Normal. --Right kidney/ureter: No hydronephrosis, nephroureterolithiasis, perinephric stranding or solid renal mass. --Left kidney/ureter: Unchanged appearance of 2.2 cm left perinephric mass. --Urinary bladder: Normal for degree of distention STOMACH/BOWEL: --Stomach/Duodenum: No hiatal hernia or other gastric abnormality. Normal duodenal course. --Small bowel: No dilatation or inflammation. --Colon:  There is a stent within the sigmoid colon, unchanged. The remainder of the colon is unremarkable. --Appendix: Normal. VASCULAR/LYMPHATIC: Atherosclerotic calcification is present within the non-aneurysmal abdominal aorta, without hemodynamically significant stenosis. The portal vein, splenic vein, superior mesenteric vein and IVC are patent. No abdominal or pelvic lymphadenopathy. REPRODUCTIVE: Unchanged appearance of left adnexal mass measuring 3.3 x 3.0 cm. MUSCULOSKELETAL. Multilevel degenerative disc disease and facet arthrosis. No bony spinal canal stenosis. OTHER: None. IMPRESSION: 1. No acute abdominal or pelvic abnormality. 2. Unchanged appearance of stent within the distal colon. No evidence of acute obstruction. 3. Unchanged appearance of bibasilar pulmonary metastases, hepatic metastases and left perinephric mass. 4. Unchanged size of indeterminate lesion of the left adnexa. 5.  Aortic Atherosclerosis (ICD10-I70.0). Electronically Signed   By: Ulyses Jarred M.D.   On: 11/07/2017 18:44    Procedures Procedures (including critical care time)  Medications Ordered in ED Medications  iopamidol (ISOVUE-300) 61 % injection (has no administration in time range)  sodium chloride 0.9 % bolus 1,000 mL (0 mLs Intravenous Stopped 11/07/17 1951)  morphine 4 MG/ML injection 4 mg (4 mg Intravenous Given 11/07/17 1652)  ondansetron (ZOFRAN) injection 4 mg (4 mg Intravenous Given 11/07/17 1652)  iopamidol (ISOVUE-300) 61 % injection 100 mL (100 mLs Intravenous Contrast Given 11/07/17 1756)     Initial Impression / Assessment and Plan / ED Course  I have reviewed the triage vital signs and the nursing notes.  Pertinent labs & imaging results that were available during my care of the patient were reviewed by me and considered in my medical decision making (see chart for details).  Patient presents to the emergency department for evaluation of lower abdominal pain and constipation, currently being treated for  Colon cancer history of bowel obstruction requiring stent placement in December after.  On initial exam vitals are normal and patient is overall well-appearing, mild tenderness across the lower abdomen with some guarding, bowel sounds present throughout.  Will get basic labs, urine and CT of the abdomen and pelvis.  Patient does appear slightly dehydrated will give fluid bolus, morphine and Zofran.  Lab evaluation  is overall reassuring, no metabolic derangements, normal renal function patient's LFTs are slightly elevated,, she does have liver metastasis, she is not having any right upper quadrant tenderness will have her follow-up with Dr.Feng for recheck of this.  No leukocytosis, stable hemoglobin, normal lipase.  Urine without evidence of infection.  CT shows no acute abdominal or pelvic abnormality, Unchanged appearance of stent within the distal colon. No evidence of acute obstruction.  Unchanged metastasis.  Discussed these results at length with patient and family member CT does not show a significant stool burden or evidence of fecal impaction either.  Suspect with decreased appetite patient has not been eating very much to produce large amount of stool.  At this time patient is pain-free and tolerating p.o. here in the emergency department I feel she is stable for discharge home with close follow-up with her oncologist and recheck of her liver function tests.  Strict return precautions discussed with the patient she expresses understanding and is in agreement with plan.  Patient discussed with Dr. Tomi Bamberger who saw and evaluated the patient as well and is agreement with plan.   Final Clinical Impressions(s) / ED Diagnoses   Final diagnoses:  Lower abdominal pain  Malignant neoplasm of colon, unspecified part of colon ([place])  Decreased appetite    ED Discharge Orders    None       Janet Berlin 11/08/17 1155    Dorie Rank, MD 11/09/17 0002

## 2017-11-07 NOTE — ED Notes (Signed)
Bed: RS85 Expected date:  Expected time:  Means of arrival:  Comments: Chemo pt

## 2017-11-07 NOTE — ED Triage Notes (Signed)
Pt w/ hx of colon cancer complains of abdominal pain, nausea, constipation for the past week. Pt has hx of bowel obstruction.

## 2017-11-07 NOTE — Discharge Instructions (Signed)
Your evaluation today has been very reassuring, abdominal CT scan shows patent colon stent and no evidence of acute obstruction or other acute abdominal issue.  Your labs today look good as well, your liver enzymes are slightly elevated please have these rechecked within the next week with Dr. Burr Medico.  Continue with your home pain medications, make sure you are drinking plenty of water.  Increase your p.o. intake slowly.   If you have any worsening or new abdominal pain, fevers or chills, persistent nausea and vomiting, blood in the stool or any other new or concerning symptoms please do not hesitate to return to the emergency department.

## 2017-11-13 ENCOUNTER — Ambulatory Visit: Payer: Medicare Other

## 2017-11-13 ENCOUNTER — Encounter: Payer: Self-pay | Admitting: Hematology

## 2017-11-13 ENCOUNTER — Ambulatory Visit: Payer: Medicare Other | Admitting: Hematology

## 2017-11-13 ENCOUNTER — Inpatient Hospital Stay: Payer: Medicare Other | Attending: Hematology

## 2017-11-13 ENCOUNTER — Inpatient Hospital Stay: Payer: Medicare Other

## 2017-11-13 ENCOUNTER — Other Ambulatory Visit: Payer: Medicare Other

## 2017-11-13 ENCOUNTER — Inpatient Hospital Stay (HOSPITAL_BASED_OUTPATIENT_CLINIC_OR_DEPARTMENT_OTHER): Payer: Medicare Other | Admitting: Hematology

## 2017-11-13 ENCOUNTER — Telehealth: Payer: Self-pay | Admitting: Hematology

## 2017-11-13 VITALS — BP 138/72 | HR 74 | Temp 98.2°F | Resp 18

## 2017-11-13 DIAGNOSIS — I4891 Unspecified atrial fibrillation: Secondary | ICD-10-CM | POA: Insufficient documentation

## 2017-11-13 DIAGNOSIS — R109 Unspecified abdominal pain: Secondary | ICD-10-CM

## 2017-11-13 DIAGNOSIS — C187 Malignant neoplasm of sigmoid colon: Secondary | ICD-10-CM

## 2017-11-13 DIAGNOSIS — I10 Essential (primary) hypertension: Secondary | ICD-10-CM | POA: Diagnosis not present

## 2017-11-13 DIAGNOSIS — C787 Secondary malignant neoplasm of liver and intrahepatic bile duct: Secondary | ICD-10-CM

## 2017-11-13 DIAGNOSIS — D649 Anemia, unspecified: Secondary | ICD-10-CM | POA: Diagnosis not present

## 2017-11-13 DIAGNOSIS — Z7189 Other specified counseling: Secondary | ICD-10-CM | POA: Diagnosis not present

## 2017-11-13 DIAGNOSIS — C7800 Secondary malignant neoplasm of unspecified lung: Secondary | ICD-10-CM | POA: Diagnosis not present

## 2017-11-13 DIAGNOSIS — K5669 Other partial intestinal obstruction: Secondary | ICD-10-CM | POA: Insufficient documentation

## 2017-11-13 DIAGNOSIS — C186 Malignant neoplasm of descending colon: Secondary | ICD-10-CM

## 2017-11-13 DIAGNOSIS — Z5112 Encounter for antineoplastic immunotherapy: Secondary | ICD-10-CM | POA: Insufficient documentation

## 2017-11-13 LAB — COMPREHENSIVE METABOLIC PANEL
ALBUMIN: 3.5 g/dL (ref 3.5–5.0)
ALK PHOS: 144 U/L (ref 40–150)
ALT: 25 U/L (ref 0–55)
ANION GAP: 10 (ref 3–11)
AST: 15 U/L (ref 5–34)
BUN: 8 mg/dL (ref 7–26)
CALCIUM: 9.4 mg/dL (ref 8.4–10.4)
CHLORIDE: 105 mmol/L (ref 98–109)
CO2: 25 mmol/L (ref 22–29)
Creatinine, Ser: 0.8 mg/dL (ref 0.60–1.10)
GFR calc non Af Amer: >60 mL/min (ref >60)
GLUCOSE: 83 mg/dL (ref 70–140)
Potassium: 3.2 mmol/L — ABNORMAL LOW (ref 3.5–5.1)
SODIUM: 140 mmol/L (ref 136–145)
Total Bilirubin: 0.5 mg/dL (ref 0.2–1.2)
Total Protein: 7.7 g/dL (ref 6.4–8.3)

## 2017-11-13 LAB — CBC WITH DIFFERENTIAL/PLATELET
Basophils Absolute: 0 10*3/uL (ref 0.0–0.1)
Basophils Relative: 0 %
Eosinophils Absolute: 0.3 10*3/uL (ref 0.0–0.5)
Eosinophils Relative: 3 %
HEMATOCRIT: 38.4 % (ref 34.8–46.6)
HEMOGLOBIN: 12.2 g/dL (ref 11.6–15.9)
LYMPHS ABS: 1.9 10*3/uL (ref 0.9–3.3)
Lymphocytes Relative: 20 %
MCH: 31.1 pg (ref 25.1–34.0)
MCHC: 31.8 g/dL (ref 31.5–36.0)
MCV: 98 fL (ref 79.5–101.0)
MONO ABS: 1.7 10*3/uL — AB (ref 0.1–0.9)
MONOS PCT: 18 %
NEUTROS ABS: 5.5 10*3/uL (ref 1.5–6.5)
NEUTROS PCT: 59 %
Platelets: 395 10*3/uL (ref 145–400)
RBC: 3.92 MIL/uL (ref 3.70–5.45)
RDW: 17.1 % — AB (ref 11.2–14.5)
WBC: 9.4 10*3/uL (ref 3.9–10.3)

## 2017-11-13 LAB — MAGNESIUM: MAGNESIUM: 1.9 mg/dL (ref 1.5–2.5)

## 2017-11-13 MED ORDER — SODIUM CHLORIDE 0.9 % IV SOLN
6.0000 mg/kg | Freq: Once | INTRAVENOUS | Status: AC
  Administered 2017-11-13: 500 mg via INTRAVENOUS
  Filled 2017-11-13: qty 20

## 2017-11-13 MED ORDER — HEPARIN SOD (PORK) LOCK FLUSH 100 UNIT/ML IV SOLN
500.0000 [IU] | Freq: Once | INTRAVENOUS | Status: DC | PRN
  Filled 2017-11-13: qty 5

## 2017-11-13 MED ORDER — SODIUM CHLORIDE 0.9% FLUSH
10.0000 mL | INTRAVENOUS | Status: DC | PRN
  Filled 2017-11-13: qty 10

## 2017-11-13 MED ORDER — OXYCODONE HCL 5 MG PO TABS: 5.0000 mg | ORAL_TABLET | Freq: Four times a day (QID) | ORAL | 0 refills | Status: DC | PRN

## 2017-11-13 MED ORDER — SODIUM CHLORIDE 0.9 % IV SOLN
Freq: Once | INTRAVENOUS | Status: AC
  Administered 2017-11-13: 10:00:00 via INTRAVENOUS

## 2017-11-13 NOTE — Telephone Encounter (Signed)
Appointments moved as requested by caregiver per 4/4 walk in

## 2017-11-13 NOTE — Progress Notes (Signed)
[facility]  [phone] [phone]  Clinic Follow Up Note   Patient Care Team: Moshe Cipro, MD as PCP - General (Internal Medicine) Alexis Frock, MD as Consulting Physician (Urology) Michael Boston, MD as Consulting Physician (General Surgery) Toma Deiters, MD as Referring Physician (Gastroenterology)   Date of Service:  11/13/2017  CHIEF COMPLAINTS:  Metastatic sigmoid colon Adenocarcinoma  Oncology History   Cancer Staging Cancer of left colon [facility]) Staging form: Colon and Rectum, AJCC 8th Edition - Clinical stage from 09/04/2016: Stage IVB (cTX, cNX, pM1b) - Signed by Truitt Merle, MD on 11/12/2016       Cancer of sigmoid colon ([place])   09/02/2016 Procedure    Colonoscopy Diverticulosis (scattered). Pt has about 10 cm structure starting at 20 cm with abnormal mucosa. Biopsies taken.       09/02/2016 Pathology Results    MODERATELY DIFFERENTIATED ADENOCARCINOMA OF COLON AT 20 CM      09/04/2016 Initial Diagnosis    Cancer of left colon ([place])      09/06/2016 Imaging    CT CAP w Contrast 1. No acute abdominopelvic process.  2. Heterogenous hypodense area involving the medial edge of the left kidney with multiple small soft tissue like stranding involving the left pararenal space. Findings are suspicious for malignancy of the left kidney with small pararenal metastases. MRI abdomen with contrast should be considered to further evaluate.  3. Vague hypodensity involving the lateral segment of the liver. This can also be reevaluated with MRI.  4. Partially visualized 5 mm posterior right lower lobe nodular density. CT chest without contrast follow up is recommended.  5. Complex multilobular left ovarian lesion may reflect a cluster of cysts.      10/10/2016 Procedure    Operative Report by Dr. Tresa Moore on 10/18/16 Procedure: 1) Cystoscopy, left retrograde pyelogram interpretation. 2) Left diagnostic uteroscopy. 3) Insertion of left uteral  stent; 5 x 24 Polaris with tether. Findings: 1) Unremarkable bladder. 2) Unremarkable left retrograde pyelogram. 3) No evidence of intraluminal urothelial neoplasm whatsoever with insepction of the left kidney and ureter times several. 4) Successful placement of left ureteral stent, proximal in renal pelvis and distal in urinary bladder.      10/15/2016 Imaging    MRI abdomen pelvis 1. There are 2 enhancing hepatic lesions highly worrisome for metastatic disease, likely metastatic colon cancer. 2. The lesion of concern in the anterior suprahilar lip of the left kidney is also enhancing, worrisome for neoplasm. This has a somewhat atypical appearance for renal cell carcinoma and could reflect a metastasis as well. 3. New left-sided hydroureter with delayed contrast excretion consistent with distal ureteral obstruction. Specific etiology uncertain. 4. Complex cystic and solid left adnexal lesion could reflect cystic neoplasm, although does not appear aggressive or appear to reflect the cause of the distal left ureteral obstruction. 5. Sigmoid colon wall thickening could be related to reported newly diagnosed colon cancer.      11/06/2016 PET scan    PET 11/06/2016 IMPRESSION: 1. Hypermetabolic pulmonary and hepatic lesions, most consistent with metastatic colon cancer, with the hypermetabolic primary located in the sigmoid colon. 2. Left renal and left adnexal lesions, better seen and evaluated on 10/15/2016. 3. Persistent moderate left hydronephrosis. 4. Aortic atherosclerosis (ICD10-170.0).      11/11/2016 Pathology Results    Liver, needle/core biopsy, left lobe - METASTATIC ADENOCARCINOMA. The histologic features are consistent with a primary gastrointestinal adenocarcinoma.      05/19/2017 Imaging    CT Abdomen  Pelvis, 05/19/2017 IMPRESSION: 1. Worsening pulmonary and hepatic metastatic disease. Primary sigmoid lesion is stable to minimally enlarged. 2. Stable to slight  enlargement of a left perinephric mass, likely a metastasis. Renal cell carcinoma is not excluded. Associated pelvocaliectasis versus mild hydronephrosis. 3. Borderline left periaortic lymph nodes, slightly enlarged. 4. Cholelithiasis. 5.  Aortic atherosclerosis (ICD10-170.0). 6. Cystic and solid lesion in the left adnexa, as on prior exams, likely ovarian in origin.      06/06/2017 -  Chemotherapy    IV antibody Vectibix every [redacted] weeks along with oral chemo Xeloda for 1 week on and 1 week off starting 06/06/17.   For cycle 2 she had 2027m Xeloda for 10 days and then 10 days off. Return to 1 week on and 1 week off with starting with Cycle 3. Held after 07/24/17 due to hospitalization for SBO. Restarted Xeloda and Vectibix on 09/04/17      08/07/2017 - 08/16/2017 Hospital Admission    Admit date: 08/07/17 Admission diagnosis: Bowel Obstruction  Additional comments: She was admitted to the hospital on 08/07/17 [facility] due to a bowel obstruction.  She had a sigmoid colon stent placement on 08/11/17. Bowel obstruction resolved.  CT scan in hospital showed great partial response to chemotherapy. She will continue chemotherapy given good overall tolerance.        08/07/2017 Imaging    CT AP W Contrast 08/07/17 IMPRESSION: Low colonic obstruction secondary to a rectosigmoid stricture at the site of the patient's prior colonic mass, which is no longer evident on CT. Mild upstream colonic wall thickening raises the possibility of superimposed colitis.  Improving hepatic metastases, as above. Left perirenal metastasis, decreased. Visualized pulmonary metastases at the lung bases are grossly unchanged.  3.5 cm mixed cystic/solid left ovarian mass is mildly decreased.       08/11/2017 Procedure    By Dr. MWatt Climes Flexible Sigmoidoscopy 08/11/17 IMPRESSION:   - Preparation of the colon was fair. - Likely malignant partially obstructing tumor in the  recto-sigmoid colon versus necrotic scarring. Injected with contrast proximal to obstruction to confirm proper position. Prosthesis placed. - Stool in the rectum. - No specimens collected.       10/22/2017 Imaging    CT CAP W Contrast 10/22/17 IMPRESSION: 1. Significant interval improvement and liver metastasis. 2. The left perinephric mass is slightly decreased in size in the interval. 3. No significant interval change in the appearance of multiple pulmonary metastasis. 4. Stable appearance of mediastinal adenopathy. 5. Gallstones 6.  Aortic Atherosclerosis (ICD10-I70.0). 7. Colonic stent remains in place without evidence for bowel obstruction.      HISTORY OF PRESENTING ILLNESS (10/25/2016):  Mary ENERSON89y.o. female is here because of adenocarcinoma of the colon and a left renal mass. The pt noticed worsening episodes of abdominal cramping and nausea with bowel movements in July 2017. She had an abdominal [place]aperformed, which was unremarkable. A colonoscopy was then performed at the patient's request, which found a mass about 20 cm from the anal verge. Biopsy showed moderately differentiated adenocarcinoma. MRI done, with a lesion noticed in the left kidney near the pelvis, a 11 mm hepatic lesion, 4 mm lung nodule, and possible cystic left ovarian mass. Pt saw Dr. GJohney Mainefor information on abdominal surgery for her colon, which she declined. She also saw Dr. MLorna Dibble who is considering a nephrectomy, but is planning a sternoscopy to better characterize the kidney lesion. She presents today for continued treatment.  She presents today with her son and daughter-in-law. The symptoms started in late July 2017. She had some vomiting, abdominal pain, and constipation, but not bleeding. Her PCP never found any anemia. Initially she lost her appetite, but now she is taking multiple vitamins and supplements, so her appetite is back. Her bowel has improved, but she still has abdominal pain that  comes and goes, lasting minutes. The pain is tolerable with aleve. She did not tolerate Tramadol well; nausea and vomiting. She has some occasional left sides back pain. Denies weight loss, or any other concerns. She is able to do everything she needs to around the house.   She had a partial hysterectomy, and a lumpectomy. She has a history of HTN, atrial fibrillation, high cholesterol. She has some hearing loss and can not see out of her right eye. Not a diabetic, but her son and her mother are. No family history of colon cancer. Her sister had breast cancer in her 17's and her brother had lung cancer in his 22's. Never smoker. Doesn't drink alcohol. 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 She has two sons; one lives an hour away, and the other lives in [place]. She lives alone.   CURRENT THERAPY:   IV antibody Vectibix every [redacted] weeks along with oral chemo Xeloda for 1 week on and 1 week off starting 06/06/17. For cycle 2 she had 2074m Xeloda for 10 days and then 10 days off. Return to 1 week on and 1 week off with starting with Cycle 3. Held after 07/24/17 due to hospitalization for SBO. Restarted Xeloda and Vectibix on 09/04/17. Due to skin toxicities we reduced Xeloda to 2000 mg in the AM and 1501min the PM 1 week on and 1 week off on 10/31/17.  INTERVAL HISTORY:    AnRIKO LUMSDENs here for a follow up. She presents to the infusion room today with her daughter-in-law. She notes she went to ED on 11/07/17 due to lower abdominal pain. She had not had a Bowel movement for days before. She had a CT and there was no obstruction. To get herself to have a bowel movement she took Miralax. She notes 1.5 dose helps her and was able to avoid diarrhea. She notes she did not feel nauseas.   On review of symptoms, pt notes her abdominal pain has improved and she is back to having BMs.     MEDICAL HISTORY:  Past Medical History:  Diagnosis Date  . Aneurysm of ascending aorta ([facility]) 12/03/2016   3.7 cm by echo  10/2016  . [facility]   [facility] from colonoscopy-- malignant ---  currently having work-up done w/ dr gross (general surgeon) and coordinate surgery w/ urologsit for renal tumor  . Essential hypertension 10/31/2016  . GERD (gastroesophageal reflux disease)   . Glaucoma   . Hiatal hernia   . HOH (hard of hearing)    bilateral even w/ hearing aids  . Hyperlipidemia   . Kidney tumor    left side  . Legally blind in right eye, [place]  due to macular degeneration  . Macular degeneration   . Nausea    intermittant due to colon mass  . Ovarian cyst   . Persistent atrial fibrillation ([place]   on ASA only  . PONV (postoperative nausea and vomiting)   . Wears glasses   . Wears hearing aid    bilateral    SURGICAL HISTORY: Past Surgical  History:  Procedure Laterality Date  . BREAST CYST EXCISION  1990   benign  . COLONOSCOPY  09/04/2016  . CYSTOSCOPY WITH RETROGRADE PYELOGRAM, URETEROSCOPY AND STENT PLACEMENT Left 10/10/2016   Procedure: CYSTOSCOPY WITH RETROGRADE PYELOGRAM, DIAGNOSTIC URETEROSCOPY  AND STENT PLACEMENT;  Surgeon: Alexis Frock, MD;  Location: [facility];  Service: Urology;  Laterality: Left;  . ESOPHAGOGASTRODUODENOSCOPY  11/07/2014  . FLEXIBLE SIGMOIDOSCOPY N/A 08/11/2017   Procedure: FLEXIBLE SIGMOIDOSCOPY;  Surgeon: Clarene Essex, MD;  Location: [facility];  Service: Endoscopy;  Laterality: N/A;  With Fluoroscopy for colonic stent placement for colonic stricture  . NASAL SINUS SURGERY    . TONSILLECTOMY      SOCIAL HISTORY: Social History   Socioeconomic History  . Marital status: Widowed    Spouse name: Not on file  . Number of children: Not on file  . Years of education: Not on file  . Highest education level: Not on file  Occupational History  . Not on file  Social Needs  . Financial resource strain: Not on file  . Food insecurity:    Worry: Not on file    Inability: Not on file  . Transportation  needs:    Medical: Not on file    Non-medical: Not on file  Tobacco Use  . Smoking status: Never Smoker  . Smokeless tobacco: Never Used  Substance and Sexual Activity  . Alcohol use: No  . Drug use: No  . Sexual activity: Not on file  Lifestyle  . Physical activity:    Days per week: Not on file    Minutes per session: Not on file  . Stress: Not on file  Relationships  . Social connections:    Talks on phone: Not on file    Gets together: Not on file    Attends religious service: Not on file    Active member of club or organization: Not on file    Attends meetings of clubs or organizations: Not on file    Relationship status: Not on file  . Intimate partner violence:    Fear of current or ex partner: Not on file    Emotionally abused: Not on file    Physically abused: Not on file    Forced sexual activity: Not on file  Other Topics Concern  . Not on file  Social History Narrative  . Not on file    FAMILY HISTORY: Family History  Problem Relation Age of Onset  . Cancer Sister 38       breast cancer   . Cancer Brother 36       lung cancer   . Diabetes Son   . High blood pressure Son   . Peripheral Artery Disease Mother   . High blood pressure Mother     ALLERGIES:  is allergic to benazepril; tylenol [acetaminophen]; and bactrim [sulfamethoxazole-trimethoprim].  MEDICATIONS:  Current Outpatient Medications  Medication Sig Dispense Refill  . amLODipine (NORVASC) 5 MG tablet Take 1 tablet (5 mg total) by mouth daily. 90 tablet 1  . capecitabine (XELODA) 500 MG tablet Take 4 tablets (2,000 mg total) by mouth 2 (two) times daily after a meal. Take for 7 days then off 7 days 112 tablet 2  . clindamycin (CLINDAGEL) 1 % gel Apply topically 2 (two) times daily. 60 g 3  . diltiazem (CARDIZEM CD) 240 MG 24 hr capsule Take 1 capsule (240 mg total) by mouth daily. 90 capsule 1  . docusate sodium (COLACE) 100 MG capsule  Take 1 capsule (100 mg total) by mouth 2 (two) times  daily. 60 capsule 2  . escitalopram (LEXAPRO) 10 MG tablet Take 10 mg by mouth daily.    . fexofenadine (ALLEGRA) 180 MG tablet Take 180 mg by mouth daily.    . furosemide (LASIX) 40 MG tablet Take 1 tablet (40 mg total) by mouth daily. 90 tablet 1  . latanoprost (XALATAN) 0.005 % ophthalmic solution Place 1 drop into both eyes at bedtime.    Marland Kitchen losartan (COZAAR) 100 MG tablet Take 1 tablet (100 mg total) by mouth daily. 90 tablet 0  . metoprolol tartrate (LOPRESSOR) 25 MG tablet 1/2 TABLET BY MOUTH AS NEEDED FOR PALPITATIONS (Patient not taking: Reported on 11/07/2017) 30 tablet 1  . Multiple Vitamins-Minerals (VITEYES AREDS ADVANCED) CAPS Take 1 capsule by mouth daily.    Marland Kitchen omeprazole (PRILOSEC) 40 MG capsule Take 40 mg by mouth daily.    . ondansetron (ZOFRAN ODT) 4 MG disintegrating tablet Take 1 tablet (4 mg total) by mouth every 8 (eight) hours as needed for nausea or vomiting. 30 tablet 0  . oxyCODONE (OXY IR/ROXICODONE) 5 MG immediate release tablet Take 1 tablet (5 mg total) by mouth every 6 (six) hours as needed for moderate pain or severe pain. 90 tablet 0  . Polyethyl Glycol-Propyl Glycol (SYSTANE) 0.4-0.3 % SOLN Apply 1 drop to eye daily as needed (dry eyes).     . polyethylene glycol (MIRALAX / GLYCOLAX) packet Take 17 g by mouth daily as needed for moderate constipation. 30 each 0  . Potassium Chloride ER 20 MEQ TBCR Take 20 mEq by mouth daily. (Patient not taking: Reported on 11/07/2017) 30 tablet 1  . urea (CARMOL) 10 % cream Apply topically as needed. (Patient not taking: Reported on 11/07/2017) 71 g 0   Current Facility-Administered Medications  Medication Dose Route Frequency Provider Last Rate Last Dose  . 0.9 %  sodium chloride infusion  250 mL Intravenous Continuous Skeet Latch, MD      . sodium chloride flush (NS) 0.9 % injection 3 mL  3 mL Intravenous Q12H Skeet Latch, MD      . sodium chloride flush (NS) 0.9 % injection 3 mL  3 mL Intravenous PRN Skeet Latch, MD       Facility-Administered Medications Ordered in Other Visits  Medication Dose Route Frequency Provider Last Rate Last Dose  . heparin lock flush 100 unit/mL  500 Units Intracatheter Once PRN Truitt Merle, MD      . sodium chloride flush (NS) 0.9 % injection 10 mL  10 mL Intracatheter PRN Truitt Merle, MD        REVIEW OF SYSTEMS:   Constitutional: Denies fevers, chills or abnormal    Eyes: Denies blurriness of vision, double vision or watery eyes Ears, nose, mouth, throat, and face: Denies mucositis or sore throat Respiratory: Denies cough, dyspnea or wheezes Cardiovascular: Denies palpitation, chest discomfort  Gastrointestinal:  Denies heartburn  Skin: (+) New rash on chest and back unrelated to Vectibix Lymphatics: Denies new lymphadenopathy or easy bruising MSK: negative Neurological:Denies numbness, tingling or new weaknesses Behavioral/Psych: Mood is stable, no new changes  All other systems were reviewed with the patient and are negative.  PHYSICAL EXAMINATION:  ECOG PERFORMANCE STATUS: 1 - Symptomatic but completely ambulatory   Vitals with BMI 11/13/2017  Height 5'6"  Weight 175 lbs 8 oz  BMI 66.44  Systolic 034  Diastolic 70  Pulse 78  Respirations 20     GENERAL:alert, no  distress and comfortable SKIN: skin color, texture, turgor are normal (+) a few acne-like rash on face, no visible rash or skin erythema on scalp, dry skin on palms, (+) skin crack on left thumb  EYES: normal, conjunctiva are pink and non-injected, sclera clear OROPHARYNX:no exudate, no erythema and lips, buccal mucosa, and tongue normal  NECK: supple, thyroid normal size, non-tender, without nodularity LYMPH:  no palpable lymphadenopathy in the cervical, axillary or inguinal LUNGS: clear to auscultation and percussion with normal breathing effort HEART: regular rate & rhythm and no murmurs and (+) symmetrical lower extremity edema  ABDOMEN:abdomen soft, non-tender and normal bowel  sounds Musculoskeletal:no cyanosis of digits and no clubbing  PSYCH: alert & oriented x 3 with fluent speech NEURO: no focal motor/sensory deficits   LABORATORY DATA:  I have reviewed the data as listed CBC Latest Ref Rng & Units 11/13/2017 11/07/2017 10/31/2017  WBC 3.9 - 10.3 K/uL 9.4 8.0 9.3  Hemoglobin 11.6 - 15.9 g/dL 12.2 11.6(L) 12.1  Hematocrit 34.8 - 46.6 % 38.4 35.6(L) 36.6  Platelets 145 - 400 K/uL 395 367 264   CMP Latest Ref Rng & Units 11/13/2017 11/07/2017 10/31/2017  Glucose 70 - 140 mg/dL 83 112(H) 75  BUN 7 - 26 mg/dL _0 Creatinine 0.60 - 1.10 mg/dL 0.80 0.79 0.82  Sodium 136 - 145 mmol/L 140 139 139  Potassium 3.5 - 5.1 mmol/L 3.2(L) 3.7 3.6  Chloride 98 - 109 mmol/L 105 104 107  CO2 22 - 29 mmol/L _1 Calcium 8.4 - 10.4 mg/dL 9.4 9.3 9.5  Total Protein 6.4 - 8.3 g/dL 7.7 7.5 7.5  Total Bilirubin 0.2 - 1.2 mg/dL 0.5 1.5(H) 0.6  Alkaline Phos 40 - 150 U/L 144 171(H) 108  AST 5 - 34 U/L 15 90(H) 15  ALT 0 - 55 U/L 25 65(H) 9   PATHOLOGY: [facility] 11/11/16   Diagnosis 11/11/16 Liver, needle/core biopsy, left lobe - METASTATIC ADENOCARCINOMA. - SEE COMMENT. Microscopic Comment The histologic features are consistent with a primary gastrointestinal adenocarcinoma. Dr. Vicente Males has reviewed the case and concurs with this interpretation. There is likely sufficient tumor present for additional studies, if requested. Mary Cutter MD Pathologist, Electronic Signature (Case signed 11/12/2016)  Diagnosis 09/04/2016 MODERATELY DIFFERENTIATED ADENOCARCINOMA OF COLON AT 20 CM Tumor Site: Colon at 20 cm Tumor Type: Adenocarcinoma Tumore Size: Unknown  Grade: Moderately differentiated   RADIOGRAPHIC STUDIES: I have personally reviewed the radiological images as listed and agreed with the findings in the report.  CT CAP W Contrast 10/22/17 IMPRESSION: 1. Significant interval improvement and liver metastasis. 2. The left perinephric mass is slightly  decreased in size in the interval. 3. No significant interval change in the appearance of multiple pulmonary metastasis. 4. Stable appearance of mediastinal adenopathy. 5. Gallstones 6.  Aortic Atherosclerosis (ICD10-I70.0). 7. Colonic stent remains in place without evidence for bowel obstruction.  CT AP W Contrast 08/07/17 IMPRESSION: Low colonic obstruction secondary to a rectosigmoid stricture at the site of the patient's prior colonic mass, which is no longer evident on CT. Mild upstream colonic wall thickening raises the possibility of superimposed colitis.Improving hepatic metastases, as above. Left perirenal metastasis, decreased. Visualized pulmonary metastases at the lung bases are grossly unchanged. 3.5 cm mixed cystic/solid left ovarian mass is mildly decreased.   CT Chest 06/06/17  IMPRESSION: 1. There are multiple new and enlarging pulmonary nodules throughout the lungs bilaterally. Additionally, a few of the nodules have decreased in size when compared to prior.  2. Interval increase in size of precarinal lymph node. 3. Interval increase in size of hepatic lesions. 4. Aortic Atherosclerosis (ICD10-I70.0).  CT Abdomen Pelvis, 05/19/2017 IMPRESSION: 1. Worsening pulmonary and hepatic metastatic disease. Primary sigmoid lesion is stable to minimally enlarged. 2. Stable to slight enlargement of a left perinephric mass, likely a metastasis. Renal cell carcinoma is not excluded. Associated pelvocaliectasis versus mild hydronephrosis. 3. Borderline left periaortic lymph nodes, slightly enlarged. 4. Cholelithiasis. 5.  Aortic atherosclerosis (ICD10-170.0). 6. Cystic and solid lesion in the left adnexa, as on prior exams, likely ovarian in origin.  PET 11/06/2016 IMPRESSION: 1. Hypermetabolic pulmonary and hepatic lesions, most consistent with metastatic colon cancer, with the hypermetabolic primary located in the sigmoid colon. 2. Left renal and left adnexal lesions,  better seen and evaluated on 10/15/2016. 3. Persistent moderate left hydronephrosis. 4. Aortic atherosclerosis (ICD10-170.0).  MRI Abdomen Pelvis w/wo Contrast 10/15/2016 IMPRESSION: 1. There are 2 enhancing hepatic lesions highly worrisome for metastatic disease, likely metastatic colon cancer. 2. The lesion of concern in the anterior suprahilar lip of the left kidney is also enhancing, worrisome for neoplasm. This has a somewhat atypical appearance for renal cell carcinoma and could reflect a metastasis as well. 3. New left-sided hydroureter with delayed contrast excretion consistent with distal ureteral obstruction. Specific etiology uncertain. 4. Complex cystic and solid left adnexal lesion could reflect cystic neoplasm, although does not appear aggressive or appear to reflect the cause of the distal left ureteral obstruction. 5. Sigmoid colon wall thickening could be related to reported newly diagnosed colon cancer.  CT CAP w Contrast 09/06/2016 (done outside) IMPRESSION: 1. No acute abdominopelvic process.  2. Heterogenous hypodense area involving the medial edge of the left kidney with multiple small soft tissue like stranding involving the left pararenal space. Findings are suspicious for malignancy of the left kidney with small pararenal metastases. MRI abdomen with contrast should be considered to further evaluate.  3. Vague hypodensity involving the lateral segment of the liver. This can also be reevaluated with MRI.  4. Partially visualized 5 mm posterior right lower lobe nodular density. CT chest without contrast follow up is recommended.  5. Complex multilobular left ovarian lesion may reflect a cluster of cysts.    PROCEDURES  Flexible Sigmoidoscopy 08/11/17 IMPRESSION:   - Preparation of the colon was fair. - Likely malignant partially obstructing tumor in the recto-sigmoid colon versus necrotic scarring. Injected with contrast proximal to obstruction to confirm  proper position. Prosthesis placed. - Stool in the rectum. - No specimens collected.   Echocardiogram 11/03/16 - Left ventricle: The cavity size was normal. Wall thickness was   increased in a pattern of mild LVH. Indeterminate diastolic   function (atrial fibrillation). Systolic function was normal. The   estimated ejection fraction was in the range of 60% to 65%.   Operative Report by Dr. Tresa Moore on 10/10/16 Procedure: 1) Cystoscopy, left retrograde pyelogram interpretation. 2) Left diagnostic uteroscopy. 3) Insertion of left uteral stent; 5 x 24 Polaris with tether. Findings: 1) Unremarkable bladder. 2) Unremarkable left retrograde pyelogram. 3) No evidence of intraluminal urothelial neoplasm whatsoever with inspection of the left kidney and ureter times several. 4) Successful placement of left ureteral stent, proximal in renal pelvis and distal in urinary bladder.  Colonoscopy 09/02/2016 FINDINGS: Diverticulosis (scattered). Pt has about 10 cm structure starting at 20 cm with abnormal mucosa. The colonoscopy scope was not able to advance due to structure, and upper endoscopy scope was used and it was advanced through the stricture to  see ileocecal valve. Biopsies at the stricture were taken.   ASSESSMENT & PLAN:  Jameica is a 84 y.o. female   1. Adenocarcinoma of left colon, sigmoid colon, TxNxM1 with liver and lung mets, stage IV, KRAS/NRS wild type, MSI-stable  -I previously discussed the imaging, colonoscopy and biopsy results with the patient and her family in detail.  -She has seen Drs. Manny and Gross, surgical resections were discussed.  -PET scan on 11/06/16 showed hypermetabolic pulmonary and hepatic lesions consistent with metastatic colon cancer with the primary located in the sigmoid colon. I reviewed the images with pt and her family members in person  -US biopsy on 11/11/16 of the left lobe of the liver revealed metastatic adenocarcinoma. The features are consistent with a  primary gastrointestinal adenocarcinoma. I discussed with pt  -We previously discussed that her metastatic cancer is incurable at this stage due to the multiorgan metastasis, and her advanced age. The goal of therapy is palliative. -We discussed the indication for surgery, including significant bleeding, obstruction, or perforation. She does have mild constipation and intermittent abdominal cramps, but no significant bowel obstruction. Patient understands the surgery is palliative. She prefers to avoid surgery at this point.  -We reviewed [facility] test results, which showed MSI- stable, no KRAS/NRAS or BRAF mutations. Based on this, she is not a candidate for immunotherapy, but she will likely benefit from EGFR inhibitor, giving the left side colon cancer. --The [facility] results showed she is not a candidate for immunotherapy, but she is a candidate for EGFR antibody therapy. -We discussed that the prognosis of left side colon cancer without KRAS/NRAS and BRAF mutations is much better than right side colon cancer, especially with EGFR antibody treatment. -We discussed the options of systemic therapy. Due to her advanced age, she is not a candidate for intensive chemotherapy. I recommend that panitumumab, an EGFR inhibitor with low intensity chemo (5-FU infusion or Xeloda to control her cancer. -We discussed alternative management options with palliative care alone, which will be also appropriate given her advanced age and medical comorbidities. -She has recently developed worsening abdominal pain, constipation and nausea, concerning for bowel obstruction from her sigmoid colon cancer. I recommended she increase Miralax and Colace.  - I recommended low residual, low fiber diet, she is tolerating well, her nausea and abdominal discomfort has improved -I reviewed her CT abdomen and pelvis from 05/19/17, which showed disease progression in liver and lung, the primary sigmoid colon mass is stable, no  imaging evidence of bowel obstruction. -On 06/06/17 she started first line Vectibix every [redacted] weeks along with oral chemo Xeloda for 1 week on and 1 week off. She has tolerated well overall, except mild skin rash -She was hospitalized on 08/07/17 for bowel obstruction. She had a sigmoid colon stent placement on 08/11/17 to resolve this. She has recovered well  -Her 08/07/17 CT AP showed great partial response to chemotherapy, no new mets. Given her excellent tolerance to treatment and good response I previously discussed continuing with chemotherapy.  Certainly given her advanced age, if she has more side effects, will likely stop her treatment and switch to comfort care.  We previously had a long conversation about this, and discussed the goal of care again.  Pt finally decided to continue with treatment  -CT CAP from 10/22/17 revealed continue interval improvement in liver and lung metastasis, no new lesions. I discussed these results with the pt today and reviewed the images in person. She is responding well to treatment and  I recommend her to continue. She agreed to proceed with treatment.  -Repeat CT scan in 3 months  -She is having a good response to treatment, but does have skin toxicities. Starting 10/31/17 we reduced Xeloda to 2051m in the AM and 1507min PM to reduce her hand foot syndrome.  -She went to ED on 11/07/17 due to significant abdominal pain and had a CT scan which showed no obstruction.  Her pain was probably related to her constipation. -I discussed increasing her laxative use especially due to her pain medication use. -Lab reviewed, adequate for treatment, she is clinically doing well, will proceed Vectibix and Xeloda -f/u in 2 weeks   2. Constipation and abdominal pain  -Presented to ED on 11/07/17 for significant lower abdominal pain, CT showed no obstruction, improved with return of BMs 2 days later.  -I reviewed laxative use. If she does not have BM for 3 days she should take  Miralax 1.5 dose or up to 3-4 times a day until she had BM. If this does not work she can also use Senokot-S.  -She can use Colace 2 tabs BID, but I explained laxities will help her better.  -Pt notes Magnesium citrate caused significant bloating and she does not want to take it again.  --She is currently taking oxycodone 3-4 times a day. I refilled today (11/13/17) -Will continue to monitor.    3. Left renal mass and left hydronephrosis -Possible renal cell carcinoma, metastatic lesion from colon cancer is also a possibility. -She has been seen by [facility], Dr. MaTresa Mooreand had left ureter stent placement on 10/10/2016.  -no signs of hydronephrosis on 10/22/17 CT scan, and a left renal mass is stable.  4. Complex cyst of left ovary -will monitor. Likely benign -If she undergo abdominal surgery, may remove it -She declines surgery for now -08/07/17 CT shows mild decrease in size of cyst -10/22/17 CT shows stable cyst   5. Atrial Fibrillation -The patient has a history of atrial fibrillation. -Her cardiologist wants her to undergo cardio inversion and anticoagulation. -I previously advised the patient that even if she undergoes cardio inversion, her Afib might return. Also, her anticoagulation medication might make her bleeding worse. -The patient should continue taking baby aspirin.  6. HTN -I previously advised the patient to continue medications and follow up with primary care physician  7. Nutrition -We previously discussed palliative chemotherapy will make her fatigued, nauseous, and loss of appetite. -I previously stressed the importance of a healthy diet with plenty of protein. -I previously referred to Nutrition.  -Her eating has improved.  8.Goal of care discussion  -We previously discussed the incurable nature of her cancer, and the overall poor prognosis, especially if she does not have good response to chemotherapy or progress on chemo -The patient understands the goal of care is  palliative. -I previously recommended DNR/DNI, she will think about it   9. Skin cracks on finger tips, Facial Acne type rash, secondary to chemo and panitumuamb  -Continue hydrocortisone and clindamycin gel for acneiform rash, which is very mild overall. -Eucerin, oatmeal bath, po benadryl PRN and Claritin for body itching -For her hands I previously suggested a vinegar and water soak along with daily neosporin around her nails. I previously recommend she use lotion after she washes her hands and advices her to wear cotton glove to protect her skin. -Her previous Vectibix rash is resolved.  -previously prescribed Urea cream for skin dryness and cracking on her hands. I advised her  to wear gloves when doing things with her hands -She will pick up Urea cream this week.  11. SBO, s/p sigmoid stent placement  -Hospitalized on 08/07/17, and had stent placed on 08/11/17.  -Now resolved.  -She is on low residual diet   Plan  -Refilled oxycodone today  -Please move her future appointments from Friday to one day before, early morning appointments preferred  -f/u in 2 weeks before chemo    All questions were answered. The patient knows to call the clinic with any problems, questions or concerns. I spent a total of 25 minutes for her visit, more than 50% face-to-face counseling.  This document serves as a record of services personally performed by Truitt Merle, MD. It was created on her behalf by Joslyn Devon, a trained medical scribe. The creation of this record is based on the scribe's personal observations and the provider's statements to them.   I have reviewed the above documentation for accuracy and completeness, and I agree with the above.     Truitt Merle, MD 11/13/2017 1:15 PM

## 2017-11-20 ENCOUNTER — Other Ambulatory Visit: Payer: Self-pay | Admitting: Hematology

## 2017-11-20 DIAGNOSIS — C186 Malignant neoplasm of descending colon: Secondary | ICD-10-CM

## 2017-11-24 ENCOUNTER — Telehealth: Payer: Self-pay | Admitting: *Deleted

## 2017-11-24 ENCOUNTER — Other Ambulatory Visit: Payer: Self-pay | Admitting: *Deleted

## 2017-11-24 ENCOUNTER — Telehealth: Payer: Self-pay | Admitting: Hematology

## 2017-11-24 DIAGNOSIS — C186 Malignant neoplasm of descending colon: Secondary | ICD-10-CM

## 2017-11-24 MED ORDER — CAPECITABINE 500 MG PO TABS: ORAL_TABLET | ORAL | 2 refills | Status: DC

## 2017-11-24 NOTE — Telephone Encounter (Signed)
"  Shawn with [facility] calling for new Xeloda order.  Patient reports changes with Xeloda.  [phone]."

## 2017-11-24 NOTE — Telephone Encounter (Signed)
Patient called in to reschedule  °

## 2017-11-25 MED FILL — CAPECITABINE 500 MG TABLET: 500 | 28 days supply | Qty: 98 | Fill #0

## 2017-11-26 NOTE — Progress Notes (Signed)
[place]  [phone] [phone]  Clinic Follow Up Note   Patient Care Team: Moshe Cipro, MD as PCP - General (Internal Medicine) Alexis Frock, MD as Consulting Physician (Urology) Michael Boston, MD as Consulting Physician (General Surgery) Toma Deiters, MD as Referring Physician (Gastroenterology)   Date of Service:  11/28/2017  CHIEF COMPLAINTS:  Follow up Metastatic sigmoid colon Adenocarcinoma  Oncology History   Cancer Staging Cancer of left colon [facility]) Staging form: Colon and Rectum, AJCC 8th Edition - Clinical stage from 09/04/2016: Stage IVB (cTX, cNX, pM1b) - Signed by Truitt Merle, MD on 11/12/2016       Cancer of sigmoid colon ([place])   09/02/2016 Procedure    Colonoscopy Diverticulosis (scattered). Pt has about 10 cm structure starting at 20 cm with abnormal mucosa. Biopsies taken.       09/02/2016 Pathology Results    MODERATELY DIFFERENTIATED ADENOCARCINOMA OF COLON AT 20 CM      09/04/2016 Initial Diagnosis    Cancer of left colon ([place])      09/06/2016 Imaging    CT CAP w Contrast 1. No acute abdominopelvic process.  2. Heterogenous hypodense area involving the medial edge of the left kidney with multiple small soft tissue like stranding involving the left pararenal space. Findings are suspicious for malignancy of the left kidney with small pararenal metastases. MRI abdomen with contrast should be considered to further evaluate.  3. Vague hypodensity involving the lateral segment of the liver. This can also be reevaluated with MRI.  4. Partially visualized 5 mm posterior right lower lobe nodular density. CT chest without contrast follow up is recommended.  5. Complex multilobular left ovarian lesion may reflect a cluster of cysts.      10/10/2016 Procedure    Operative Report by Dr. Tresa Moore on 10/18/16 Procedure: 1) Cystoscopy, left retrograde pyelogram interpretation. 2) Left diagnostic uteroscopy. 3) Insertion of  left uteral stent; 5 x 24 Polaris with tether. Findings: 1) Unremarkable bladder. 2) Unremarkable left retrograde pyelogram. 3) No evidence of intraluminal urothelial neoplasm whatsoever with insepction of the left kidney and ureter times several. 4) Successful placement of left ureteral stent, proximal in renal pelvis and distal in urinary bladder.      10/15/2016 Imaging    MRI abdomen pelvis 1. There are 2 enhancing hepatic lesions highly worrisome for metastatic disease, likely metastatic colon cancer. 2. The lesion of concern in the anterior suprahilar lip of the left kidney is also enhancing, worrisome for neoplasm. This has a somewhat atypical appearance for renal cell carcinoma and could reflect a metastasis as well. 3. New left-sided hydroureter with delayed contrast excretion consistent with distal ureteral obstruction. Specific etiology uncertain. 4. Complex cystic and solid left adnexal lesion could reflect cystic neoplasm, although does not appear aggressive or appear to reflect the cause of the distal left ureteral obstruction. 5. Sigmoid colon wall thickening could be related to reported newly diagnosed colon cancer.      11/06/2016 PET scan    PET 11/06/2016 IMPRESSION: 1. Hypermetabolic pulmonary and hepatic lesions, most consistent with metastatic colon cancer, with the hypermetabolic primary located in the sigmoid colon. 2. Left renal and left adnexal lesions, better seen and evaluated on 10/15/2016. 3. Persistent moderate left hydronephrosis. 4. Aortic atherosclerosis (ICD10-170.0).      11/11/2016 Pathology Results    Liver, needle/core biopsy, left lobe - METASTATIC ADENOCARCINOMA. The histologic features are consistent with a primary gastrointestinal adenocarcinoma.      05/19/2017 Imaging  CT Abdomen Pelvis, 05/19/2017 IMPRESSION: 1. Worsening pulmonary and hepatic metastatic disease. Primary sigmoid lesion is stable to minimally enlarged. 2. Stable  to slight enlargement of a left perinephric mass, likely a metastasis. Renal cell carcinoma is not excluded. Associated pelvocaliectasis versus mild hydronephrosis. 3. Borderline left periaortic lymph nodes, slightly enlarged. 4. Cholelithiasis. 5.  Aortic atherosclerosis (ICD10-170.0). 6. Cystic and solid lesion in the left adnexa, as on prior exams, likely ovarian in origin.      06/06/2017 -  Chemotherapy    IV antibody Vectibix every [redacted] weeks along with oral chemo Xeloda for 1 week on and 1 week off starting 06/06/17.   For cycle 2 she had 2061m Xeloda for 10 days and then 10 days off. Return to 1 week on and 1 week off with starting with Cycle 3. Held after 07/24/17 due to hospitalization for SBO. Restarted Xeloda and Vectibix on 09/04/17      08/07/2017 - 08/16/2017 Hospital Admission    Admit date: 08/07/17 Admission diagnosis: Bowel Obstruction  Additional comments: She was admitted to the hospital on 08/07/17 [facility] due to a bowel obstruction.  She had a sigmoid colon stent placement on 08/11/17. Bowel obstruction resolved.  CT scan in hospital showed great partial response to chemotherapy. She will continue chemotherapy given good overall tolerance.        08/07/2017 Imaging    CT AP W Contrast 08/07/17 IMPRESSION: Low colonic obstruction secondary to a rectosigmoid stricture at the site of the patient's prior colonic mass, which is no longer evident on CT. Mild upstream colonic wall thickening raises the possibility of superimposed colitis.  Improving hepatic metastases, as above. Left perirenal metastasis, decreased. Visualized pulmonary metastases at the lung bases are grossly unchanged.  3.5 cm mixed cystic/solid left ovarian mass is mildly decreased.       08/11/2017 Procedure    By Dr. MWatt Climes Flexible Sigmoidoscopy 08/11/17 IMPRESSION:   - Preparation of the colon was fair. - Likely malignant partially obstructing  tumor in the recto-sigmoid colon versus necrotic scarring. Injected with contrast proximal to obstruction to confirm proper position. Prosthesis placed. - Stool in the rectum. - No specimens collected.       10/22/2017 Imaging    CT CAP W Contrast 10/22/17 IMPRESSION: 1. Significant interval improvement and liver metastasis. 2. The left perinephric mass is slightly decreased in size in the interval. 3. No significant interval change in the appearance of multiple pulmonary metastasis. 4. Stable appearance of mediastinal adenopathy. 5. Gallstones 6.  Aortic Atherosclerosis (ICD10-I70.0). 7. Colonic stent remains in place without evidence for bowel obstruction.      HISTORY OF PRESENTING ILLNESS (10/25/2016):  Mary [id number]. female is here because of adenocarcinoma of the colon and a left renal mass. The pt noticed worsening episodes of abdominal cramping and nausea with bowel movements in July 2017. She had an abdominal UK[place]aperformed, which was unremarkable. A colonoscopy was then performed at the patient's request, which found a mass about 20 cm from the anal verge. Biopsy showed moderately differentiated adenocarcinoma. MRI done, with a lesion noticed in the left kidney near the pelvis, a 11 mm hepatic lesion, 4 mm lung nodule, and possible cystic left ovarian mass. Pt saw Dr. GJohney Mainefor information on abdominal surgery for her colon, which she declined. She also saw Dr. MLorna Dibble who is considering a nephrectomy, but is planning a sternoscopy to better characterize the kidney lesion. She presents today for continued treatment.  She presents today with her son and daughter-in-law. The symptoms started in late July 2017. She had some vomiting, abdominal pain, and constipation, but not bleeding. Her PCP never found any anemia. Initially she lost her appetite, but now she is taking multiple vitamins and supplements, so her appetite is back. Her bowel has improved, but she still has  abdominal pain that comes and goes, lasting minutes. The pain is tolerable with aleve. She did not tolerate Tramadol well; nausea and vomiting. She has some occasional left sides back pain. Denies weight loss, or any other concerns. She is able to do everything she needs to around the house.   She had a partial hysterectomy, and a lumpectomy. She has a history of HTN, atrial fibrillation, high cholesterol. She has some hearing loss and can not see out of her right eye. Not a diabetic, but her son and her mother are. No family history of colon cancer. Her sister had breast cancer in her 51's and her brother had lung cancer in his 65's. Never smoker. Doesn't drink alcohol. 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 She has two sons; one lives an hour away, and the other lives in [place]. She lives alone.   CURRENT THERAPY:   IV antibody Vectibix every [redacted] weeks along with oral chemo Xeloda for 1 week on and 1 week off starting 06/06/17. For cycle 2 she had 2054m Xeloda for 10 days and then 10 days off. Return to 1 week on and 1 week off with starting with Cycle 3. Held after 07/24/17 due to hospitalization for SBO. Restarted Xeloda and Vectibix on 09/04/17. Due to skin toxicities we reduced Xeloda to 2000 mg in the AM and 15070min the PM 1 week on and 1 week off on 10/31/17.  INTERVAL HISTORY:    Mary ENGELMANNs here for a follow up and IV antibody Vectibix. She presents to the infusion room today with her daughter-in-law. She reports continued pain from constipation. She has doubled her Miralax without relief. Her last bowel movement was Monday. She reports she has been taking more oxycodone lately due to the pain.   On review of systems, pt denies passing gas, or any other complaints at this time. Pertinent positives are listed and detailed within the above HPI.   MEDICAL HISTORY:  Past Medical History:  Diagnosis Date  . Aneurysm of ascending aorta ([facility]) 12/03/2016   3.7 cm by echo 10/2016  . [facility]   [facility]ospy from colonoscopy-- malignant ---  currently having work-up done w/ dr gross (general surgeon) and coordinate surgery w/ urologsit for renal tumor  . Essential hypertension 10/31/2016  . GERD (gastroesophageal reflux disease)   . Glaucoma   . Hiatal hernia   . HOH (hard of hearing)    bilateral even w/ hearing aids  . Hyperlipidemia   . Kidney tumor    left side  . Legally blind in right eye, [place]  due to macular degeneration  . Macular degeneration   . Nausea    intermittant due to colon mass  . Ovarian cyst   . Persistent atrial fibrillation ([facility]   on ASA only  . PONV (postoperative nausea and vomiting)   . Wears glasses   . Wears hearing aid    bilateral    SURGICAL HISTORY: Past Surgical History:  Procedure Laterality Date  . BREAST CYST EXCISION  1990   benign  . COLONOSCOPY  09/04/2016  .  CYSTOSCOPY WITH RETROGRADE PYELOGRAM, URETEROSCOPY AND STENT PLACEMENT Left 10/10/2016   Procedure: CYSTOSCOPY WITH RETROGRADE PYELOGRAM, DIAGNOSTIC URETEROSCOPY  AND STENT PLACEMENT;  Surgeon: Alexis Frock, MD;  Location[facility];  Service: Urology;  Laterality: Left;  . ESOPHAGOGASTRODUODENOSCOPY  11/07/2014  . FLEXIBLE SIGMOIDOSCOPY N/A 08/11/2017   Procedure: FLEXIBLE SIGMOIDOSCOPY;  Surgeon: Clarene Essex, MD;  Location: [facility];  Service: Endoscopy;  Laterality: N/A;  With Fluoroscopy for colonic stent placement for colonic stricture  . NASAL SINUS SURGERY    . TONSILLECTOMY      SOCIAL HISTORY: Social History   Socioeconomic History  . Marital status: Widowed    Spouse name: Not on file  . Number of children: Not on file  . Years of education: Not on file  . Highest education level: Not on file  Occupational History  . Not on file  Social Needs  . Financial resource strain: Not on file  . Food insecurity:    Worry: Not on file    Inability: Not on file  . Transportation needs:     Medical: Not on file    Non-medical: Not on file  Tobacco Use  . Smoking status: Never Smoker  . Smokeless tobacco: Never Used  Substance and Sexual Activity  . Alcohol use: No  . Drug use: No  . Sexual activity: Not on file  Lifestyle  . Physical activity:    Days per week: Not on file    Minutes per session: Not on file  . Stress: Not on file  Relationships  . Social connections:    Talks on phone: Not on file    Gets together: Not on file    Attends religious service: Not on file    Active member of club or organization: Not on file    Attends meetings of clubs or organizations: Not on file    Relationship status: Not on file  . Intimate partner violence:    Fear of current or ex partner: Not on file    Emotionally abused: Not on file    Physically abused: Not on file    Forced sexual activity: Not on file  Other Topics Concern  . Not on file  Social History Narrative  . Not on file    FAMILY HISTORY: Family History  Problem Relation Age of Onset  . Cancer Sister 26       breast cancer   . Cancer Brother 64       lung cancer   . Diabetes Son   . High blood pressure Son   . Peripheral Artery Disease Mother   . High blood pressure Mother     ALLERGIES:  is allergic to benazepril; tylenol [acetaminophen]; and bactrim [sulfamethoxazole-trimethoprim].  MEDICATIONS:  Current Outpatient Medications  Medication Sig Dispense Refill  . amLODipine (NORVASC) 5 MG tablet Take 1 tablet (5 mg total) by mouth daily. 90 tablet 1  . capecitabine (XELODA) 500 MG tablet Take 4 tablets ( 2000 mg ) in  AM ;  3 tablets ( 1500 mg ) in  PM for  7 days on ,  Off 7 days. 98 tablet 2  . clindamycin (CLINDAGEL) 1 % gel Apply topically 2 (two) times daily. 60 g 3  . diltiazem (CARDIZEM CD) 240 MG 24 hr capsule Take 1 capsule (240 mg total) by mouth daily. 90 capsule 1  . docusate sodium (COLACE) 100 MG capsule Take 1 capsule (100 mg total) by mouth 2 (two) times daily. 60 capsule  2  .  escitalopram (LEXAPRO) 10 MG tablet Take 10 mg by mouth daily.    . fexofenadine (ALLEGRA) 180 MG tablet Take 180 mg by mouth daily.    . furosemide (LASIX) 40 MG tablet Take 1 tablet (40 mg total) by mouth daily. 90 tablet 1  . latanoprost (XALATAN) 0.005 % ophthalmic solution Place 1 drop into both eyes at bedtime.    Marland Kitchen losartan (COZAAR) 100 MG tablet Take 1 tablet (100 mg total) by mouth daily. 90 tablet 0  . Multiple Vitamins-Minerals (VITEYES AREDS ADVANCED) CAPS Take 1 capsule by mouth daily.    Marland Kitchen omeprazole (PRILOSEC) 40 MG capsule Take 40 mg by mouth daily.    Vladimir Faster Glycol-Propyl Glycol (SYSTANE) 0.4-0.3 % SOLN Apply 1 drop to eye daily as needed (dry eyes).     Marland Kitchen lactulose (CEPHULAC) 20 g packet Take 1 packet (20 g total) by mouth every 4 (four) hours as needed (severe constipation). 30 each 0  . metoprolol tartrate (LOPRESSOR) 25 MG tablet 1/2 TABLET BY MOUTH AS NEEDED FOR PALPITATIONS (Patient not taking: Reported on 11/07/2017) 30 tablet 1  . ondansetron (ZOFRAN ODT) 4 MG disintegrating tablet Take 1 tablet (4 mg total) by mouth every 8 (eight) hours as needed for nausea or vomiting. 30 tablet 0  . oxyCODONE (OXY IR/ROXICODONE) 5 MG immediate release tablet Take 1 tablet (5 mg total) by mouth every 6 (six) hours as needed for moderate pain or severe pain. 90 tablet 0  . polyethylene glycol (MIRALAX / GLYCOLAX) packet Take 17 g by mouth daily as needed for moderate constipation. 30 each 0  . Potassium Chloride ER 20 MEQ TBCR Take 20 mEq by mouth daily. (Patient not taking: Reported on 11/07/2017) 30 tablet 1  . urea (CARMOL) 10 % cream Apply topically as needed. (Patient not taking: Reported on 11/07/2017) 71 g 0   Current Facility-Administered Medications  Medication Dose Route Frequency Provider Last Rate Last Dose  . 0.9 %  sodium chloride infusion  250 mL Intravenous Continuous Skeet Latch, MD      . sodium chloride flush (NS) 0.9 % injection 3 mL  3 mL Intravenous Q12H  Skeet Latch, MD      . sodium chloride flush (NS) 0.9 % injection 3 mL  3 mL Intravenous PRN Skeet Latch, MD        REVIEW OF SYSTEMS:   Constitutional: Denies fevers, chills or abnormal    Eyes: Denies blurriness of vision, double vision or watery eyes Ears, nose, mouth, throat, and face: Denies mucositis or sore throat Respiratory: Denies cough, dyspnea or wheezes Cardiovascular: Denies palpitation, chest discomfort  Gastrointestinal:  Denies heartburn (+) constipation and abdominal pain Skin: Denies rashes  Lymphatics: Denies new lymphadenopathy or easy bruising MSK: negative Neurological:Denies numbness, tingling or new weaknesses Behavioral/Psych: Mood is stable, no new changes  All other systems were reviewed with the patient and are negative.  PHYSICAL EXAMINATION:  ECOG PERFORMANCE STATUS: 1 - Symptomatic but completely ambulatory   Vitals with BMI 11/13/2017  Height 5'6"  Weight 175 lbs 8 oz  BMI 41.74  Systolic 081  Diastolic 70  Pulse 78  Respirations 20     GENERAL:alert, no distress and comfortable SKIN: skin color, texture, turgor are normal  EYES: normal, conjunctiva are pink and non-injected, sclera clear OROPHARYNX:no exudate, no erythema and lips, buccal mucosa, and tongue normal  NECK: supple, thyroid normal size, non-tender, without nodularity LYMPH:  no palpable lymphadenopathy in the cervical, axillary or inguinal LUNGS:  clear to auscultation and percussion with normal breathing effort HEART: regular rate & rhythm and no murmurs and (+) symmetrical lower extremity edema  ABDOMEN:abdomen soft, non-tender. (+) active bowel sounds Musculoskeletal:no cyanosis of digits and no clubbing  PSYCH: alert & oriented x 3 with fluent speech NEURO: no focal motor/sensory deficits   LABORATORY DATA:  I have reviewed the data as listed CBC Latest Ref Rng & Units 11/28/2017 11/13/2017 11/07/2017  WBC 3.9 - 10.3 K/uL 8.9 9.4 8.0  Hemoglobin 11.6 - 15.9 g/dL  11.6 12.2 11.6(L)  Hematocrit 34.8 - 46.6 % 35.7 38.4 35.6(L)  Platelets 145 - 400 K/uL 324 395 367   CMP Latest Ref Rng & Units 11/28/2017 11/13/2017 11/07/2017  Glucose 70 - 140 mg/dL 86 83 112(H)  BUN 7 - 26 mg/dL _0 Creatinine 0.60 - 1.10 mg/dL 0.82 0.80 0.79  Sodium 136 - 145 mmol/L 138 140 139  Potassium 3.5 - 5.1 mmol/L 3.0(LL) 3.2(L) 3.7  Chloride 98 - 109 mmol/L 105 105 104  CO2 22 - 29 mmol/L _1 Calcium 8.4 - 10.4 mg/dL 9.0 9.4 9.3  Total Protein 6.4 - 8.3 g/dL 7.5 7.7 7.5  Total Bilirubin 0.2 - 1.2 mg/dL 0.9 0.5 1.5(H)  Alkaline Phos 40 - 150 U/L 128 144 171(H)  AST 5 - 34 U/L 51(H) 15 90(H)  ALT 0 - 55 U/L 34 25 65(H)   PATHOLOGY: [facility] 11/11/16   Diagnosis 11/11/16 Liver, needle/core biopsy, left lobe - METASTATIC ADENOCARCINOMA. - SEE COMMENT. Microscopic Comment The histologic features are consistent with a primary gastrointestinal adenocarcinoma. Dr. Vicente Males has reviewed the case and concurs with this interpretation. There is likely sufficient tumor present for additional studies, if requested. Enid Cutter MD Pathologist, Electronic Signature (Case signed 11/12/2016)  Diagnosis 09/04/2016 MODERATELY DIFFERENTIATED ADENOCARCINOMA OF COLON AT 20 CM Tumor Site: Colon at 20 cm Tumor Type: Adenocarcinoma Tumore Size: Unknown  Grade: Moderately differentiated   RADIOGRAPHIC STUDIES: I have personally reviewed the radiological images as listed and agreed with the findings in the report.  DG Abdomen 11/28/17 IMPRESSION: Stent within the sigmoid colon. Gaseous distention of bowel above the stent concerning for a degree of bowel obstruction.  CT CAP W Contrast 10/22/17 IMPRESSION: 1. Significant interval improvement and liver metastasis. 2. The left perinephric mass is slightly decreased in size in the interval. 3. No significant interval change in the appearance of multiple pulmonary metastasis. 4. Stable appearance of mediastinal  adenopathy. 5. Gallstones 6.  Aortic Atherosclerosis (ICD10-I70.0). 7. Colonic stent remains in place without evidence for bowel obstruction.  CT AP W Contrast 08/07/17 IMPRESSION: Low colonic obstruction secondary to a rectosigmoid stricture at the site of the patient's prior colonic mass, which is no longer evident on CT. Mild upstream colonic wall thickening raises the possibility of superimposed colitis.Improving hepatic metastases, as above. Left perirenal metastasis, decreased. Visualized pulmonary metastases at the lung bases are grossly unchanged. 3.5 cm mixed cystic/solid left ovarian mass is mildly decreased.  CT Chest 06/06/17  IMPRESSION: 1. There are multiple new and enlarging pulmonary nodules throughout the lungs bilaterally. Additionally, a few of the nodules have decreased in size when compared to prior. 2. Interval increase in size of precarinal lymph node. 3. Interval increase in size of hepatic lesions. 4. Aortic Atherosclerosis (ICD10-I70.0).  CT Abdomen Pelvis, 05/19/2017 IMPRESSION: 1. Worsening pulmonary and hepatic metastatic disease. Primary sigmoid lesion is stable to minimally enlarged. 2. Stable to slight enlargement of a left perinephric mass, likely a  metastasis. Renal cell carcinoma is not excluded. Associated pelvocaliectasis versus mild hydronephrosis. 3. Borderline left periaortic lymph nodes, slightly enlarged. 4. Cholelithiasis. 5.  Aortic atherosclerosis (ICD10-170.0). 6. Cystic and solid lesion in the left adnexa, as on prior exams, likely ovarian in origin.  PET 11/06/2016 IMPRESSION: 1. Hypermetabolic pulmonary and hepatic lesions, most consistent with metastatic colon cancer, with the hypermetabolic primary located in the sigmoid colon. 2. Left renal and left adnexal lesions, better seen and evaluated on 10/15/2016. 3. Persistent moderate left hydronephrosis. 4. Aortic atherosclerosis (ICD10-170.0).  MRI Abdomen Pelvis w/wo Contrast  10/15/2016 IMPRESSION: 1. There are 2 enhancing hepatic lesions highly worrisome for metastatic disease, likely metastatic colon cancer. 2. The lesion of concern in the anterior suprahilar lip of the left kidney is also enhancing, worrisome for neoplasm. This has a somewhat atypical appearance for renal cell carcinoma and could reflect a metastasis as well. 3. New left-sided hydroureter with delayed contrast excretion consistent with distal ureteral obstruction. Specific etiology uncertain. 4. Complex cystic and solid left adnexal lesion could reflect cystic neoplasm, although does not appear aggressive or appear to reflect the cause of the distal left ureteral obstruction. 5. Sigmoid colon wall thickening could be related to reported newly diagnosed colon cancer.  CT CAP w Contrast 09/06/2016 (done outside) IMPRESSION: 1. No acute abdominopelvic process.  2. Heterogenous hypodense area involving the medial edge of the left kidney with multiple small soft tissue like stranding involving the left pararenal space. Findings are suspicious for malignancy of the left kidney with small pararenal metastases. MRI abdomen with contrast should be considered to further evaluate.  3. Vague hypodensity involving the lateral segment of the liver. This can also be reevaluated with MRI.  4. Partially visualized 5 mm posterior right lower lobe nodular density. CT chest without contrast follow up is recommended.  5. Complex multilobular left ovarian lesion may reflect a cluster of cysts.   PROCEDURES  Flexible Sigmoidoscopy 08/11/17 IMPRESSION:   - Preparation of the colon was fair. - Likely malignant partially obstructing tumor in the recto-sigmoid colon versus necrotic scarring. Injected with contrast proximal to obstruction to confirm proper position. Prosthesis placed. - Stool in the rectum. - No specimens collected.   Echocardiogram 11/03/16 - Left ventricle: The cavity size was normal. Wall  thickness was   increased in a pattern of mild LVH. Indeterminate diastolic   function (atrial fibrillation). Systolic function was normal. The   estimated ejection fraction was in the range of 60% to 65%.   Operative Report by Dr. Tresa Moore on 10/10/16 Procedure: 1) Cystoscopy, left retrograde pyelogram interpretation. 2) Left diagnostic uteroscopy. 3) Insertion of left uteral stent; 5 x 24 Polaris with tether. Findings: 1) Unremarkable bladder. 2) Unremarkable left retrograde pyelogram. 3) No evidence of intraluminal urothelial neoplasm whatsoever with inspection of the left kidney and ureter times several. 4) Successful placement of left ureteral stent, proximal in renal pelvis and distal in urinary bladder.  Colonoscopy 09/02/2016 FINDINGS: Diverticulosis (scattered). Pt has about 10 cm structure starting at 20 cm with abnormal mucosa. The colonoscopy scope was not able to advance due to structure, and upper endoscopy scope was used and it was advanced through the stricture to see ileocecal valve. Biopsies at the stricture were taken.   ASSESSMENT & PLAN:  Avianna is a 84 y.o. female   1. Adenocarcinoma of left colon, sigmoid colon, TxNxM1 with liver and lung mets, stage IV, KRAS/NRS wild type, MSI-stable  -I previously discussed the imaging, colonoscopy and biopsy results with the patient  and her family in detail.  -She has seen Drs. Manny and Gross, surgical resections were discussed.  -PET scan on 11/06/16 showed hypermetabolic pulmonary and hepatic lesions consistent with metastatic colon cancer with the primary located in the sigmoid colon. I reviewed the images with pt and her family members in person  -US biopsy on 11/11/16 of the left lobe of the liver revealed metastatic adenocarcinoma. The features are consistent with a primary gastrointestinal adenocarcinoma. I discussed with pt  -We previously discussed that her metastatic cancer is incurable at this stage due to the multiorgan  metastasis, and her advanced age. The goal of therapy is palliative. -We discussed the indication for surgery, including significant bleeding, obstruction, or perforation. She does have mild constipation and intermittent abdominal cramps, but no significant bowel obstruction. Patient understands the surgery is palliative. She prefers to avoid surgery at this point.  -We reviewed [facility] test results, which showed MSI- stable, no KRAS/NRAS or BRAF mutations. Based on this, she is not a candidate for immunotherapy, but she will likely benefit from EGFR inhibitor, giving the left side colon cancer. --The [facility] results showed she is not a candidate for immunotherapy, but she is a candidate for EGFR antibody therapy. -We discussed that the prognosis of left side colon cancer without KRAS/NRAS and BRAF mutations is much better than right side colon cancer, especially with EGFR antibody treatment. -We discussed the options of systemic therapy. Due to her advanced age, she is not a candidate for intensive chemotherapy. I recommend that panitumumab, an EGFR inhibitor with low intensity chemo (5-FU infusion or Xeloda to control her cancer. -We discussed alternative management options with palliative care alone, which will be also appropriate given her advanced age and medical comorbidities. -She has recently developed worsening abdominal pain, constipation and nausea, concerning for bowel obstruction from her sigmoid colon cancer. I recommended she increase Miralax and Colace.  - I recommended low residual, low fiber diet, she is tolerating well, her nausea and abdominal discomfort has improved -I reviewed her CT abdomen and pelvis from 05/19/17, which showed disease progression in liver and lung, the primary sigmoid colon mass is stable, no imaging evidence of bowel obstruction. -On 06/06/17 she started first line Vectibix every [redacted] weeks along with oral chemo Xeloda for 1 week on and 1 week off. She  has tolerated well overall, except mild skin rash -She was hospitalized on 08/07/17 for bowel obstruction. She had a sigmoid colon stent placement on 08/11/17 to resolve this. She has recovered well  -Her 08/07/17 CT AP showed great partial response to chemotherapy, no new mets. Given her excellent tolerance to treatment and good response I previously discussed continuing with chemotherapy.  Certainly given her advanced age, if she has more side effects, will likely stop her treatment and switch to comfort care.  We previously had a long conversation about this, and discussed the goal of care again.  Pt finally decided to continue with treatment  -CT CAP from 10/22/17 revealed continue interval improvement in liver and lung metastasis, no new lesions. I discussed these results with the pt today and reviewed the images in person. She is responding well to treatment and I recommend her to continue. She agreed to proceed with treatment.  -Repeat CT scan in 3 months  -She is having a good response to treatment, but does have skin toxicities. Starting 10/31/17 we reduced Xeloda to 2051m in the AM and 1503min PM to reduce her hand foot syndrome.  -She went  to ED on 11/07/17 due to significant abdominal pain and had a CT scan which showed no obstruction.  Her pain was probably related to her constipation. I previously discussed increasing her laxative use especially due to her pain medication use. -She has developed worsening abdominal pain and constipation, her last bowel movement was 4 days ago.  I ordered an XR of abdomen to rule out obstruction. XR revealed partial bowel obstruction. Her labs indicated her potassium is 2.9. I will hold IV Vectibix today and give IVF with Potassium infusion. Xeloda will be held also.  -I instructed patient to change diet to liquids only, such as Carnation breakfast. -I called in lactulose every 4 hours until she has good bowel movement or diarrhea.  She will increase MiraLAX  afterwards -If she develops worsening abdominal pain in the nausea, she knows to go to emergency room -I attempted to call GI Dr. May got, unfortunately his office is closed today.  Will contact his office on Monday -If she has worsening bowel obstruction symptoms, she may need diverting colostomy -f/u in 2 weeks, will see her next week if needed    2. Constipation, abdominal pain, partial bowel obstruction -Presented to ED on 11/07/17 for significant lower abdominal pain, CT showed no obstruction, improved with return of BMs 2 days later.  -I reviewed laxative use. If she does not have BM for 3 days she should take Miralax 1.5 dose or up to 3-4 times a day until she had BM. If this does not work she can also use Senokot-S.  -She can use Colace 2 tabs BID, but I explained laxities will help her better.  -Pt notes Magnesium citrate caused significant bloating and she does not want to take it again.  --She is currently taking oxycodone 3-4 times a day. I refilled on (11/13/17) -Will continue to monitor.  -She has worsening abdominal pain from constipation today. She is using 2 cups of Miralax BID. I suspect this could be due to her increased opiate use from the pain. I ordered a XR today to rule out obstruction. If there is no obstruction I recommend using Lactulose or Magnesium Citrate and a liquid diet for now with ensure or other supplement. I will also give Dr. Watt Climes a call to see what he recommends.  -XR revealed partial bowel obstruction. I held treatment today and could not reach Dr. Watt Climes due to his office being closed. I also called [facility] who I was also unable to reach. She will get IV fluids today and I instructed her to only have a liquid diet. I sent in lactulose laxitive for her to use as instructed and I informed her to go to the ED if she develops worsening pain or vomiting, which are symptoms that her obstruction is becoming worse. I will f/u with Dr. Watt Climes on Monday 4/22   3. Left  renal mass and left hydronephrosis -Possible renal cell carcinoma, metastatic lesion from colon cancer is also a possibility. -She has been seen by [facility], Dr. Tresa Moore, and had left ureter stent placement on 10/10/2016.  -no signs of hydronephrosis on 10/22/17 CT scan, and a left renal mass is stable.  4. Complex cyst of left ovary -will monitor. Likely benign -If she undergoes abdominal surgery, may remove it -She declines surgery for now -08/07/17 CT shows mild decrease in size of cyst -10/22/17 CT shows stable cyst   5. Atrial Fibrillation -The patient has a history of atrial fibrillation. -Her cardiologist wants her to undergo  cardio inversion and anticoagulation. -I previously advised the patient that even if she undergoes cardio inversion, her Afib might return. Also, her anticoagulation medication might make her bleeding worse. -The patient should continue taking baby aspirin.  6. HTN -I previously advised the patient to continue medications and follow up with primary care physician  7. Nutrition -We previously discussed palliative chemotherapy will make her fatigued, nauseous, and loss of appetite. -I previously stressed the importance of a healthy diet with plenty of protein. -I previously referred to Nutrition.  -Her eating has improved.  8.Goal of care discussion  -We previously discussed the incurable nature of her cancer, and the overall poor prognosis, especially if she does not have good response to chemotherapy or progress on chemo -The patient understands the goal of care is palliative. -I previously recommended DNR/DNI, she will think about it   9. Skin cracks on finger tips, Facial Acne type rash, secondary to chemo and panitumuamb  -Continue hydrocortisone and clindamycin gel for acneiform rash, which is very mild overall. -Eucerin, oatmeal bath, po benadryl PRN and Claritin for body itching -For her hands I previously suggested a vinegar and water soak along with  daily neosporin around her nails. I previously recommend she use lotion after she washes her hands and advices her to wear cotton glove to protect her skin. -Her previous Vectibix rash is resolved.  -previously prescribed Urea cream for skin dryness and cracking on her hands. I advised her to wear gloves when doing things with her hands  10.  Partial bowel obstruction, s/p sigmoid stent placement  -Hospitalized on 08/07/17, and had stent placed on 08/11/17.  -she now has recurrent bowel obstruction again  -She is on low residual diet, will be on liquid diet only from now    Plan  -XR at [facility] today revealed partial bowel obstruction  -Was unable to reach Dr. Perley Jain office or [facility], I will f/u with Dr. Watt Climes on Monday 4/22 -All treatment held, she will get IVF and K infusion today (k is 3.0) -prescribed Lactulose and instructed her to follow liquid only diet -She knows to go to the ED if her symptoms worsen -F/u in 2 weeks, or sooner if needed   All questions were answered. The patient knows to call the clinic with any problems, questions or concerns. I spent a total of 40 minutes for her visit, more than 50% face-to-face counseling.  This document serves as a record of services personally performed by Truitt Merle, MD. It was created on her behalf by Theresia Bough, a trained medical scribe. The creation of this record is based on the scribe's personal observations and the provider's statements to them.   I have reviewed the above documentation for accuracy and completeness, and I agree with the above.    Truitt Merle, MD 11/28/2017 11:13 AM

## 2017-11-28 ENCOUNTER — Telehealth: Payer: Self-pay | Admitting: *Deleted

## 2017-11-28 ENCOUNTER — Ambulatory Visit: Payer: Medicare Other

## 2017-11-28 ENCOUNTER — Other Ambulatory Visit: Payer: Medicare Other

## 2017-11-28 ENCOUNTER — Encounter: Payer: Self-pay | Admitting: Hematology

## 2017-11-28 ENCOUNTER — Ambulatory Visit (HOSPITAL_COMMUNITY)
Admission: RE | Admit: 2017-11-28 | Discharge: 2017-11-28 | Disposition: A | Discharge status: Home / Self Care | Payer: Medicare Other | Source: Ambulatory Visit | Attending: Hematology | Admitting: Hematology

## 2017-11-28 ENCOUNTER — Inpatient Hospital Stay: Payer: Medicare Other

## 2017-11-28 ENCOUNTER — Inpatient Hospital Stay (HOSPITAL_BASED_OUTPATIENT_CLINIC_OR_DEPARTMENT_OTHER): Payer: Medicare Other | Admitting: Hematology

## 2017-11-28 ENCOUNTER — Ambulatory Visit: Payer: Medicare Other | Admitting: Hematology

## 2017-11-28 VITALS — BP 112/89 | HR 90 | Temp 99.0°F | Resp 16

## 2017-11-28 VITALS — BP 147/68 | HR 92 | Temp 98.5°F | Resp 19 | Ht 66.0 in | Wt 177.5 lb

## 2017-11-28 DIAGNOSIS — C187 Malignant neoplasm of sigmoid colon: Secondary | ICD-10-CM

## 2017-11-28 DIAGNOSIS — I4819 Other persistent atrial fibrillation: Secondary | ICD-10-CM

## 2017-11-28 DIAGNOSIS — C229 Malignant neoplasm of liver, not specified as primary or secondary: Secondary | ICD-10-CM | POA: Diagnosis not present

## 2017-11-28 DIAGNOSIS — C7801 Secondary malignant neoplasm of right lung: Secondary | ICD-10-CM | POA: Diagnosis not present

## 2017-11-28 DIAGNOSIS — C7800 Secondary malignant neoplasm of unspecified lung: Secondary | ICD-10-CM | POA: Diagnosis not present

## 2017-11-28 DIAGNOSIS — I4891 Unspecified atrial fibrillation: Secondary | ICD-10-CM | POA: Diagnosis not present

## 2017-11-28 DIAGNOSIS — I481 Persistent atrial fibrillation: Secondary | ICD-10-CM | POA: Diagnosis not present

## 2017-11-28 DIAGNOSIS — C787 Secondary malignant neoplasm of liver and intrahepatic bile duct: Secondary | ICD-10-CM | POA: Diagnosis not present

## 2017-11-28 DIAGNOSIS — C186 Malignant neoplasm of descending colon: Secondary | ICD-10-CM

## 2017-11-28 DIAGNOSIS — Z9689 Presence of other specified functional implants: Secondary | ICD-10-CM | POA: Insufficient documentation

## 2017-11-28 DIAGNOSIS — Z7189 Other specified counseling: Secondary | ICD-10-CM

## 2017-11-28 DIAGNOSIS — K5669 Other partial intestinal obstruction: Secondary | ICD-10-CM | POA: Diagnosis not present

## 2017-11-28 DIAGNOSIS — R1084 Generalized abdominal pain: Secondary | ICD-10-CM | POA: Diagnosis not present

## 2017-11-28 DIAGNOSIS — C189 Malignant neoplasm of colon, unspecified: Secondary | ICD-10-CM | POA: Diagnosis not present

## 2017-11-28 DIAGNOSIS — E876 Hypokalemia: Secondary | ICD-10-CM

## 2017-11-28 DIAGNOSIS — I10 Essential (primary) hypertension: Secondary | ICD-10-CM | POA: Diagnosis not present

## 2017-11-28 DIAGNOSIS — Z66 Do not resuscitate: Secondary | ICD-10-CM | POA: Diagnosis not present

## 2017-11-28 DIAGNOSIS — C7802 Secondary malignant neoplasm of left lung: Secondary | ICD-10-CM | POA: Diagnosis not present

## 2017-11-28 DIAGNOSIS — R112 Nausea with vomiting, unspecified: Secondary | ICD-10-CM | POA: Diagnosis not present

## 2017-11-28 DIAGNOSIS — K5900 Constipation, unspecified: Secondary | ICD-10-CM | POA: Diagnosis not present

## 2017-11-28 LAB — COMPREHENSIVE METABOLIC PANEL
ALK PHOS: 128 U/L (ref 40–150)
ALT: 34 U/L (ref 0–55)
ANION GAP: 8 (ref 3–11)
AST: 51 U/L — ABNORMAL HIGH (ref 5–34)
Albumin: 3.4 g/dL — ABNORMAL LOW (ref 3.5–5.0)
BUN: 11 mg/dL (ref 7–26)
CALCIUM: 9 mg/dL (ref 8.4–10.4)
CHLORIDE: 105 mmol/L (ref 98–109)
CO2: 25 mmol/L (ref 22–29)
Creatinine, Ser: 0.82 mg/dL (ref 0.60–1.10)
GFR calc non Af Amer: >60 mL/min (ref >60)
GLUCOSE: 86 mg/dL (ref 70–140)
POTASSIUM: 3 mmol/L — AB (ref 3.5–5.1)
SODIUM: 138 mmol/L (ref 136–145)
Total Bilirubin: 0.9 mg/dL (ref 0.2–1.2)
Total Protein: 7.5 g/dL (ref 6.4–8.3)

## 2017-11-28 LAB — CBC WITH DIFFERENTIAL/PLATELET
BASOS PCT: 0 %
Basophils Absolute: 0 10*3/uL (ref 0.0–0.1)
EOS ABS: 0.1 10*3/uL (ref 0.0–0.5)
EOS PCT: 2 %
HCT: 35.7 % (ref 34.8–46.6)
HEMOGLOBIN: 11.6 g/dL (ref 11.6–15.9)
LYMPHS ABS: 1.8 10*3/uL (ref 0.9–3.3)
Lymphocytes Relative: 21 %
MCH: 31.6 pg (ref 25.1–34.0)
MCHC: 32.5 g/dL (ref 31.5–36.0)
MCV: 97.3 fL (ref 79.5–101.0)
MONOS PCT: 20 %
Monocytes Absolute: 1.7 10*3/uL — ABNORMAL HIGH (ref 0.1–0.9)
NEUTROS PCT: 57 %
Neutro Abs: 5.1 10*3/uL (ref 1.5–6.5)
PLATELETS: 324 10*3/uL (ref 145–400)
RBC: 3.67 MIL/uL — ABNORMAL LOW (ref 3.70–5.45)
RDW: 17 % — AB (ref 11.2–14.5)
WBC: 8.9 10*3/uL (ref 3.9–10.3)

## 2017-11-28 LAB — CEA (IN HOUSE-CHCC): CEA (CHCC-IN HOUSE): 3.06 ng/mL (ref 0.00–5.00)

## 2017-11-28 LAB — MAGNESIUM: Magnesium: 1.7 mg/dL (ref 1.7–2.4)

## 2017-11-28 MED ORDER — SODIUM CHLORIDE 0.9 % IV SOLN
Freq: Once | INTRAVENOUS | Status: AC
  Administered 2017-11-28: 11:00:00 via INTRAVENOUS

## 2017-11-28 MED ORDER — SODIUM CHLORIDE 0.9 % IV SOLN
Freq: Once | INTRAVENOUS | Status: AC
  Administered 2017-11-28: 11:00:00 via INTRAVENOUS
  Filled 2017-11-28: qty 1000

## 2017-11-28 MED ORDER — LACTULOSE 20 G PO PACK: 20.0000 g | PACK | ORAL | 0 refills | Status: DC | PRN

## 2017-11-28 NOTE — Telephone Encounter (Signed)
Call received in [place] from pt's daughter, Colletta [place], stating prescription that was sent to pharmacy is not on her mother's formulary and will cost over $300 as well as not available at pharmacy and will need to be ordered.  Is there another medication that can be prescribed.  Message forwarded to MD and nurse for further follow up and contact with [facility]. Colletta [place] gave her return number as [phone].

## 2017-11-28 NOTE — Patient Instructions (Signed)

## 2017-11-28 NOTE — Telephone Encounter (Signed)
Called [place] & informed to increase miralax to every 3-4 hours.  Can also try Ducolax orally or supp & add Senakot-S per Dr Burr Medico.  They will try above.

## 2017-12-01 ENCOUNTER — Other Ambulatory Visit: Payer: Self-pay

## 2017-12-01 ENCOUNTER — Encounter (HOSPITAL_COMMUNITY): Payer: Self-pay | Admitting: Emergency Medicine

## 2017-12-01 ENCOUNTER — Emergency Department (HOSPITAL_COMMUNITY): Payer: Medicare Other

## 2017-12-01 ENCOUNTER — Other Ambulatory Visit (HOSPITAL_COMMUNITY): Payer: Self-pay

## 2017-12-01 ENCOUNTER — Telehealth: Payer: Self-pay

## 2017-12-01 ENCOUNTER — Inpatient Hospital Stay (HOSPITAL_COMMUNITY)
Admission: EM | Admit: 2017-12-01 | Discharge: 2017-12-07 | DRG: 375 | Disposition: A | Discharge status: Home Health | Payer: Medicare Other | Attending: Internal Medicine | Admitting: Internal Medicine

## 2017-12-01 ENCOUNTER — Telehealth: Payer: Self-pay | Admitting: *Deleted

## 2017-12-01 DIAGNOSIS — H3530 Unspecified macular degeneration: Secondary | ICD-10-CM | POA: Diagnosis present

## 2017-12-01 DIAGNOSIS — H409 Unspecified glaucoma: Secondary | ICD-10-CM | POA: Diagnosis present

## 2017-12-01 DIAGNOSIS — H9190 Unspecified hearing loss, unspecified ear: Secondary | ICD-10-CM | POA: Diagnosis present

## 2017-12-01 DIAGNOSIS — Z515 Encounter for palliative care: Secondary | ICD-10-CM

## 2017-12-01 DIAGNOSIS — C7801 Secondary malignant neoplasm of right lung: Secondary | ICD-10-CM | POA: Diagnosis present

## 2017-12-01 DIAGNOSIS — J302 Other seasonal allergic rhinitis: Secondary | ICD-10-CM | POA: Diagnosis present

## 2017-12-01 DIAGNOSIS — R112 Nausea with vomiting, unspecified: Secondary | ICD-10-CM | POA: Diagnosis not present

## 2017-12-01 DIAGNOSIS — R7989 Other specified abnormal findings of blood chemistry: Secondary | ICD-10-CM | POA: Diagnosis present

## 2017-12-01 DIAGNOSIS — I481 Persistent atrial fibrillation: Secondary | ICD-10-CM | POA: Diagnosis not present

## 2017-12-01 DIAGNOSIS — G893 Neoplasm related pain (acute) (chronic): Secondary | ICD-10-CM | POA: Diagnosis present

## 2017-12-01 DIAGNOSIS — C187 Malignant neoplasm of sigmoid colon: Secondary | ICD-10-CM | POA: Diagnosis not present

## 2017-12-01 DIAGNOSIS — C189 Malignant neoplasm of colon, unspecified: Secondary | ICD-10-CM | POA: Diagnosis not present

## 2017-12-01 DIAGNOSIS — K219 Gastro-esophageal reflux disease without esophagitis: Secondary | ICD-10-CM | POA: Diagnosis present

## 2017-12-01 DIAGNOSIS — E785 Hyperlipidemia, unspecified: Secondary | ICD-10-CM | POA: Diagnosis not present

## 2017-12-01 DIAGNOSIS — I4819 Other persistent atrial fibrillation: Secondary | ICD-10-CM | POA: Diagnosis present

## 2017-12-01 DIAGNOSIS — K56609 Unspecified intestinal obstruction, unspecified as to partial versus complete obstruction: Secondary | ICD-10-CM | POA: Diagnosis present

## 2017-12-01 DIAGNOSIS — R1084 Generalized abdominal pain: Secondary | ICD-10-CM

## 2017-12-01 DIAGNOSIS — Z7189 Other specified counseling: Secondary | ICD-10-CM | POA: Diagnosis not present

## 2017-12-01 DIAGNOSIS — K56699 Other intestinal obstruction unspecified as to partial versus complete obstruction: Secondary | ICD-10-CM | POA: Diagnosis present

## 2017-12-01 DIAGNOSIS — R109 Unspecified abdominal pain: Secondary | ICD-10-CM | POA: Diagnosis not present

## 2017-12-01 DIAGNOSIS — Z79899 Other long term (current) drug therapy: Secondary | ICD-10-CM

## 2017-12-01 DIAGNOSIS — C787 Secondary malignant neoplasm of liver and intrahepatic bile duct: Secondary | ICD-10-CM | POA: Diagnosis present

## 2017-12-01 DIAGNOSIS — C7802 Secondary malignant neoplasm of left lung: Secondary | ICD-10-CM | POA: Diagnosis not present

## 2017-12-01 DIAGNOSIS — Z801 Family history of malignant neoplasm of trachea, bronchus and lung: Secondary | ICD-10-CM

## 2017-12-01 DIAGNOSIS — I10 Essential (primary) hypertension: Secondary | ICD-10-CM | POA: Diagnosis not present

## 2017-12-01 DIAGNOSIS — E876 Hypokalemia: Secondary | ICD-10-CM

## 2017-12-01 DIAGNOSIS — R9431 Abnormal electrocardiogram [ECG] [EKG]: Secondary | ICD-10-CM | POA: Diagnosis not present

## 2017-12-01 DIAGNOSIS — H548 Legal blindness, as defined in USA: Secondary | ICD-10-CM | POA: Diagnosis present

## 2017-12-01 DIAGNOSIS — Z66 Do not resuscitate: Secondary | ICD-10-CM | POA: Diagnosis present

## 2017-12-01 DIAGNOSIS — Z886 Allergy status to analgesic agent status: Secondary | ICD-10-CM

## 2017-12-01 DIAGNOSIS — D649 Anemia, unspecified: Secondary | ICD-10-CM | POA: Diagnosis present

## 2017-12-01 DIAGNOSIS — K5900 Constipation, unspecified: Secondary | ICD-10-CM | POA: Diagnosis not present

## 2017-12-01 DIAGNOSIS — C229 Malignant neoplasm of liver, not specified as primary or secondary: Secondary | ICD-10-CM | POA: Diagnosis not present

## 2017-12-01 DIAGNOSIS — D49519 Neoplasm of unspecified behavior of unspecified kidney: Secondary | ICD-10-CM | POA: Diagnosis present

## 2017-12-01 DIAGNOSIS — R933 Abnormal findings on diagnostic imaging of other parts of digestive tract: Secondary | ICD-10-CM | POA: Diagnosis not present

## 2017-12-01 DIAGNOSIS — F329 Major depressive disorder, single episode, unspecified: Secondary | ICD-10-CM | POA: Diagnosis present

## 2017-12-01 DIAGNOSIS — Z882 Allergy status to sulfonamides status: Secondary | ICD-10-CM

## 2017-12-01 DIAGNOSIS — E860 Dehydration: Secondary | ICD-10-CM | POA: Diagnosis present

## 2017-12-01 DIAGNOSIS — Z888 Allergy status to other drugs, medicaments and biological substances status: Secondary | ICD-10-CM

## 2017-12-01 DIAGNOSIS — Z974 Presence of external hearing-aid: Secondary | ICD-10-CM

## 2017-12-01 DIAGNOSIS — I480 Paroxysmal atrial fibrillation: Secondary | ICD-10-CM | POA: Diagnosis present

## 2017-12-01 DIAGNOSIS — Z8249 Family history of ischemic heart disease and other diseases of the circulatory system: Secondary | ICD-10-CM

## 2017-12-01 DIAGNOSIS — I4891 Unspecified atrial fibrillation: Secondary | ICD-10-CM | POA: Diagnosis not present

## 2017-12-01 DIAGNOSIS — D630 Anemia in neoplastic disease: Secondary | ICD-10-CM | POA: Diagnosis not present

## 2017-12-01 DIAGNOSIS — Z8679 Personal history of other diseases of the circulatory system: Secondary | ICD-10-CM

## 2017-12-01 DIAGNOSIS — Z803 Family history of malignant neoplasm of breast: Secondary | ICD-10-CM

## 2017-12-01 LAB — LIPASE, BLOOD: Lipase: 31 U/L (ref 11–51)

## 2017-12-01 LAB — URINALYSIS, ROUTINE W REFLEX MICROSCOPIC
BILIRUBIN URINE: NEGATIVE
GLUCOSE, UA: NEGATIVE mg/dL
HGB URINE DIPSTICK: NEGATIVE
Ketones, ur: NEGATIVE mg/dL
Leukocytes, UA: NEGATIVE
Nitrite: NEGATIVE
PH: 6 (ref 5.0–8.0)
Protein, ur: NEGATIVE mg/dL
SPECIFIC GRAVITY, URINE: 1.039 — AB (ref 1.005–1.030)

## 2017-12-01 LAB — COMPREHENSIVE METABOLIC PANEL
ALK PHOS: 103 U/L (ref 38–126)
ALT: 32 U/L (ref 14–54)
AST: 24 U/L (ref 15–41)
Albumin: 3.3 g/dL — ABNORMAL LOW (ref 3.5–5.0)
Anion gap: 9 (ref 5–15)
BUN: 14 mg/dL (ref 6–20)
CALCIUM: 9.2 mg/dL (ref 8.9–10.3)
CO2: 26 mmol/L (ref 22–32)
CREATININE: 0.69 mg/dL (ref 0.44–1.00)
Chloride: 102 mmol/L (ref 101–111)
GFR calc Af Amer: >60 mL/min (ref >60)
GFR calc non Af Amer: >60 mL/min (ref >60)
GLUCOSE: 124 mg/dL — AB (ref 65–99)
Potassium: 2.9 mmol/L — ABNORMAL LOW (ref 3.5–5.1)
SODIUM: 137 mmol/L (ref 135–145)
Total Bilirubin: 0.7 mg/dL (ref 0.3–1.2)
Total Protein: 7 g/dL (ref 6.5–8.1)

## 2017-12-01 LAB — CBC WITH DIFFERENTIAL/PLATELET
Basophils Absolute: 0 10*3/uL (ref 0.0–0.1)
Basophils Relative: 0 %
EOS ABS: 0 10*3/uL (ref 0.0–0.7)
Eosinophils Relative: 0 %
HCT: 36.9 % (ref 36.0–46.0)
HEMOGLOBIN: 12.4 g/dL (ref 12.0–15.0)
LYMPHS PCT: 19 %
Lymphs Abs: 1.3 10*3/uL (ref 0.7–4.0)
MCH: 31.9 pg (ref 26.0–34.0)
MCHC: 33.6 g/dL (ref 30.0–36.0)
MCV: 94.9 fL (ref 78.0–100.0)
Monocytes Absolute: 1.8 10*3/uL — ABNORMAL HIGH (ref 0.1–1.0)
Monocytes Relative: 28 %
NEUTROS PCT: 53 %
Neutro Abs: 3.5 10*3/uL (ref 1.7–7.7)
Platelets: 402 10*3/uL — ABNORMAL HIGH (ref 150–400)
RBC: 3.89 MIL/uL (ref 3.87–5.11)
RDW: 16.4 % — ABNORMAL HIGH (ref 11.5–15.5)
WBC: 6.6 10*3/uL (ref 4.0–10.5)

## 2017-12-01 LAB — MAGNESIUM: Magnesium: 1.7 mg/dL (ref 1.7–2.4)

## 2017-12-01 MED ORDER — ONDANSETRON HCL 4 MG/2ML IJ SOLN
4.0000 mg | Freq: Once | INTRAMUSCULAR | Status: AC
  Administered 2017-12-01: 4 mg via INTRAVENOUS
  Filled 2017-12-01: qty 2

## 2017-12-01 MED ORDER — DILTIAZEM HCL ER COATED BEADS 240 MG PO CP24
240.0000 mg | ORAL_CAPSULE | Freq: Every day | ORAL | Status: DC
  Administered 2017-12-02: 240 mg via ORAL
  Filled 2017-12-01: qty 1

## 2017-12-01 MED ORDER — MORPHINE SULFATE (PF) 4 MG/ML IV SOLN
4.0000 mg | Freq: Once | INTRAVENOUS | Status: AC
  Administered 2017-12-01: 4 mg via INTRAVENOUS
  Filled 2017-12-01: qty 1

## 2017-12-01 MED ORDER — HYDROMORPHONE HCL 1 MG/ML IJ SOLN
0.5000 mg | INTRAMUSCULAR | Status: DC | PRN
  Administered 2017-12-01: 0.5 mg via INTRAVENOUS
  Administered 2017-12-02 – 2017-12-04 (×12): 1 mg via INTRAVENOUS
  Filled 2017-12-01 (×13): qty 1

## 2017-12-01 MED ORDER — POTASSIUM CHLORIDE 10 MEQ/100ML IV SOLN
10.0000 meq | Freq: Once | INTRAVENOUS | Status: AC
  Administered 2017-12-01: 10 meq via INTRAVENOUS
  Filled 2017-12-01: qty 100

## 2017-12-01 MED ORDER — METOCLOPRAMIDE HCL 5 MG/ML IJ SOLN
5.0000 mg | Freq: Four times a day (QID) | INTRAMUSCULAR | Status: DC | PRN
  Administered 2017-12-02: 5 mg via INTRAVENOUS
  Filled 2017-12-01: qty 2

## 2017-12-01 MED ORDER — SODIUM CHLORIDE 0.9 % IV BOLUS
1000.0000 mL | Freq: Once | INTRAVENOUS | Status: AC
  Administered 2017-12-01: 1000 mL via INTRAVENOUS

## 2017-12-01 MED ORDER — ENOXAPARIN SODIUM 40 MG/0.4ML ~~LOC~~ SOLN
40.0000 mg | Freq: Every day | SUBCUTANEOUS | Status: DC
  Administered 2017-12-01 – 2017-12-06 (×6): 40 mg via SUBCUTANEOUS
  Filled 2017-12-01 (×6): qty 0.4

## 2017-12-01 MED ORDER — MAGNESIUM SULFATE 50 % IJ SOLN: 2.0000 g | Freq: Once | INTRAMUSCULAR | Status: DC

## 2017-12-01 MED ORDER — LATANOPROST 0.005 % OP SOLN
1.0000 [drp] | Freq: Every day | OPHTHALMIC | Status: DC
  Administered 2017-12-02 – 2017-12-06 (×5): 1 [drp] via OPHTHALMIC
  Filled 2017-12-01 (×2): qty 2.5

## 2017-12-01 MED ORDER — ACETAMINOPHEN 325 MG PO TABS: 650.0000 mg | ORAL_TABLET | Freq: Four times a day (QID) | ORAL | Status: DC | PRN

## 2017-12-01 MED ORDER — POTASSIUM CHLORIDE 10 MEQ/100ML IV SOLN
10.0000 meq | INTRAVENOUS | Status: AC
  Administered 2017-12-01 – 2017-12-02 (×4): 10 meq via INTRAVENOUS
  Filled 2017-12-01 (×4): qty 100

## 2017-12-01 MED ORDER — IOPAMIDOL (ISOVUE-300) INJECTION 61%
INTRAVENOUS | Status: AC
  Filled 2017-12-01: qty 100

## 2017-12-01 MED ORDER — SODIUM CHLORIDE 0.9 % IV SOLN
INTRAVENOUS | Status: DC
  Administered 2017-12-01: 23:00:00 via INTRAVENOUS

## 2017-12-01 MED ORDER — THIAMINE HCL 100 MG/ML IJ SOLN
100.0000 mg | Freq: Every day | INTRAMUSCULAR | Status: DC
  Administered 2017-12-01 – 2017-12-05 (×5): 100 mg via INTRAVENOUS
  Filled 2017-12-01 (×5): qty 2

## 2017-12-01 MED ORDER — MAGNESIUM SULFATE 2 GM/50ML IV SOLN
2.0000 g | Freq: Once | INTRAVENOUS | Status: AC
  Administered 2017-12-01: 2 g via INTRAVENOUS
  Filled 2017-12-01: qty 50

## 2017-12-01 MED ORDER — AMLODIPINE BESYLATE 5 MG PO TABS
5.0000 mg | ORAL_TABLET | Freq: Every day | ORAL | Status: DC
  Administered 2017-12-02: 5 mg via ORAL
  Filled 2017-12-01: qty 1

## 2017-12-01 MED ORDER — IOPAMIDOL (ISOVUE-300) INJECTION 61%
100.0000 mL | Freq: Once | INTRAVENOUS | Status: AC | PRN
  Administered 2017-12-01: 100 mL via INTRAVENOUS

## 2017-12-01 MED ORDER — ACETAMINOPHEN 650 MG RE SUPP: 650.0000 mg | Freq: Four times a day (QID) | RECTAL | Status: DC | PRN

## 2017-12-01 NOTE — Telephone Encounter (Signed)
Patient has not had bowel movement since last Monday, tried all interventions suggested over the weekend without relief.  Abdominal distention up to chest area, N/V unable to keep anything down.

## 2017-12-01 NOTE — ED Notes (Signed)
Assisted patient onto bedpan, unable to obtain urine sample at this time.

## 2017-12-01 NOTE — Progress Notes (Signed)
ED TO INPATIENT HANDOFF REPORT  Name/Age/Gender Mary [name] 84 y.o. female  Code Status Code Status History    Date Active Date Inactive Code Status Order ID Comments User Context   08/07/2017 2053 08/16/2017 1753 DNR [id number]  Karmen Bongo, MD ED    Questions for Most Recent Historical Code Status (Order [id number])    Question Answer Comment   In the event of cardiac or respiratory ARREST Do not call a "code blue"    In the event of cardiac or respiratory ARREST Do not perform Intubation, CPR, defibrillation or ACLS    In the event of cardiac or respiratory ARREST Use medication by any route, position, wound care, and other measures to relive pain and suffering. May use oxygen, suction and manual treatment of airway obstruction as needed for comfort.         Advance Directive Documentation     Most Recent Value  Type of Advance Directive  Healthcare Power of Attorney, Living will, Out of facility DNR (pink MOST or yellow form)  Pre-existing out of facility DNR order (yellow form or pink MOST form)  -  "MOST" Form in Place?  -      Home/SNF/Other Home  Chief Complaint bowel blockage  Level of Care/Admitting Diagnosis ED Disposition    ED Disposition Condition [place]: [place] [[id number]]  Level of Care: Telemetry [5]  Admit to tele based on following criteria: Other see comments  Comments: hypokalemia  Diagnosis: Colon obstruction ([place]) [696789]  Admitting Physician: Toy Baker [[id number]]  Attending Physician: Toy Baker [[id number]]  Estimated length of stay: 3 - 4 days  Certification:: I certify this patient will need inpatient services for at least 2 midnights  PT Class (Do Not Modify): Inpatient [101]  PT Acc Code (Do Not Modify): Private [1]       Medical History Past Medical History:  Diagnosis Date  . Aneurysm of ascending aorta (HCC) 12/03/2016   3.7 cm by echo 10/2016  . Colon cancer [facility])    dx  via biospy from colonoscopy-- malignant ---  currently having work-up done w/ dr gross (general surgeon) and coordinate surgery w/ urologsit for renal tumor  . Essential hypertension 10/31/2016  . GERD (gastroesophageal reflux disease)   . Glaucoma   . Hiatal hernia   . HOH (hard of hearing)    bilateral even w/ hearing aids  . Hyperlipidemia   . Kidney tumor    left side  . Legally blind in right eye, as defined in [place]    due to macular degeneration  . Macular degeneration   . Nausea    intermittant due to colon mass  . Ovarian cyst   . Persistent atrial fibrillation ([place]) 10/31/2016   on ASA only  . PONV (postoperative nausea and vomiting)   . Wears glasses   . Wears hearing aid    bilateral    Allergies Allergies  Allergen Reactions  . Benazepril     COUGH   . Tylenol [Acetaminophen]     Anxiety   . Bactrim [Sulfamethoxazole-Trimethoprim] Nausea And Vomiting    IV Location/Drains/Wounds Patient Lines/Drains/Airways Status   Active Line/Drains/Airways    Name:   Placement date:   Placement time:   Site:   Days:   Peripheral IV 12/01/17 Left Antecubital   12/01/17    1346    Antecubital   less than 1   Ureteral Drain/Stent Left ureter 5 Fr.   10/10/16  1222    Left ureter   417   GI Stent    08/11/17    1325    -   112   Incision (Closed) 10/10/16 Perineum Left   10/10/16    1213     417          Labs/Imaging Results for orders placed or performed during the hospital encounter of 12/01/17 (from the past 48 hour(s))  CBC with Differential     Status: Abnormal   Collection Time: 12/01/17  1:49 PM  Result Value Ref Range   WBC 6.6 4.0 - 10.5 K/uL   RBC 3.89 3.87 - 5.11 MIL/uL   Hemoglobin 12.4 12.0 - 15.0 g/dL   HCT 36.9 36.0 - 46.0 %   MCV 94.9 78.0 - 100.0 fL   MCH 31.9 26.0 - 34.0 pg   MCHC 33.6 30.0 - 36.0 g/dL   RDW 16.4 (H) 11.5 - 15.5 %   Platelets 402 (H) 150 - 400 K/uL   Neutrophils Relative % 53 %   Neutro Abs 3.5 1.7 - 7.7 K/uL   Lymphocytes  Relative 19 %   Lymphs Abs 1.3 0.7 - 4.0 K/uL   Monocytes Relative 28 %   Monocytes Absolute 1.8 (H) 0.1 - 1.0 K/uL   Eosinophils Relative 0 %   Eosinophils Absolute 0.0 0.0 - 0.7 K/uL   Basophils Relative 0 %   Basophils Absolute 0.0 0.0 - 0.1 K/uL    Comment: Performed at [facility], [place] [address] [place], [place] [address]  Comprehensive metabolic panel     Status: Abnormal   Collection Time: 12/01/17  1:49 PM  Result Value Ref Range   Sodium 137 135 - 145 mmol/L   Potassium 2.9 (L) 3.5 - 5.1 mmol/L   Chloride 102 101 - 111 mmol/L   CO2 26 22 - 32 mmol/L   Glucose, Bld 124 (H) 65 - 99 mg/dL   BUN 14 6 - 20 mg/dL   Creatinine, Ser 0.69 0.44 - 1.00 mg/dL   Calcium 9.2 8.9 - 10.3 mg/dL   Total Protein 7.0 6.5 - 8.1 g/dL   Albumin 3.3 (L) 3.5 - 5.0 g/dL   AST 24 15 - 41 U/L   ALT 32 14 - 54 U/L   Alkaline Phosphatase 103 38 - 126 U/L   Total Bilirubin 0.7 0.3 - 1.2 mg/dL   GFR calc non Af Amer >60 >60 mL/min   GFR calc Af Amer >60 >60 mL/min    Comment: (NOTE) The eGFR has been calculated using the CKD EPI equation. This calculation has not been validated in all clinical situations. eGFR's persistently <60 mL/min signify possible Chronic Kidney Disease.    Anion gap 9 5 - 15    Comment: Performed at [facility], [place] [address] [place], [place] [address]  Lipase, blood     Status: None   Collection Time: 12/01/17  1:49 PM  Result Value Ref Range   Lipase 31 11 - 51 U/L    Comment: Performed at [facility], [place] [address], [place], [place] [address]  Magnesium     Status: None   Collection Time: 12/01/17  1:49 PM  Result Value Ref Range   Magnesium 1.7 1.7 - 2.4 mg/dL    Comment: Performed at [facility], [place] [address], [place], [place] [address]  Urinalysis, Routine w reflex microscopic     Status: Abnormal   Collection Time: 12/01/17  6:40  PM  Result Value Ref Range   Color, Urine  YELLOW YELLOW   APPearance CLEAR CLEAR   Specific Gravity, Urine 1.039 (H) 1.005 - 1.030   pH 6.0 5.0 - 8.0   Glucose, UA NEGATIVE NEGATIVE mg/dL   Hgb urine dipstick NEGATIVE NEGATIVE   Bilirubin Urine NEGATIVE NEGATIVE   Ketones, ur NEGATIVE NEGATIVE mg/dL   Protein, ur NEGATIVE NEGATIVE mg/dL   Nitrite NEGATIVE NEGATIVE   Leukocytes, UA NEGATIVE NEGATIVE    Comment: Performed at [name] [address], [place], [place] [address]   Ct Abdomen Pelvis W Contrast  Result Date: 12/01/2017 CLINICAL DATA:  Abdominal pain, nausea vomiting. No bowel movement for 1 week. EXAM: CT ABDOMEN AND PELVIS WITH CONTRAST TECHNIQUE: Multidetector CT imaging of the abdomen and pelvis was performed using the standard protocol following bolus administration of intravenous contrast. CONTRAST:  145m ISOVUE-300 IOPAMIDOL (ISOVUE-300) INJECTION 61% COMPARISON:  11/07/2017 FINDINGS: Lower chest: Known bilateral metastatic pulmonary nodules. The largest in the right lung base measuring 1.1 cm, stable. The largest in the left lung base measuring 1.4 cm, decreased from 2.2 cm on most recent body CT, image 26/63, sequence 4. Hepatobiliary: Known hepatic metastases, 1 in the dome of the liver and 1 in the lateral aspect of the left lobe of the liver are more indistinct and appears somewhat smaller. Cholelithiasis. No evidence of acute cholecystitis. Pancreas: Unremarkable. No pancreatic ductal dilatation or surrounding inflammatory changes. Spleen: Normal in size without focal abnormality. Adrenals/Urinary Tract: Normal appearance of the adrenal glands and right kidney. Ill-defined 2.2 x 2.4 cm mass in the left renal hilum, stable. No evidence of hydronephrosis. Stomach/Bowel: There is diffuse dilation of the colon which demonstrates featureless appearance and liquid contents to the level of the distal sigmoid colon stent. Vascular/Lymphatic: Aortic atherosclerosis. No enlarged abdominal or pelvic lymph  nodes. Reproductive: Unchanged appearance of left adnexal mass. Normal right adnexa. Postcholecystectomy. Other: No abdominal wall hernia or abnormality. No abdominopelvic ascites. Musculoskeletal: No acute or significant osseous findings. Multilevel osteoarthritic changes. IMPRESSION: Probable obstruction at the level of the sigmoid colon stent with diffuse dilation of the upstream colon. The small bowel is fluid-filled but demonstrates normal caliber. Somewhat decreased in size known hepatic metastatic disease. Stable in size known left renal mass. Stable left adnexal mass. Decreased in size left lung base pulmonary metastatic nodule. Electronically Signed   By: DFidela SalisburyM.D.   On: 12/01/2017 17:32    Pending Labs [employer](From admission, onward)   Start     Ordered   Signed and Held  Magnesium  Tomorrow morning,   R    Comments:  Call MD if <1.5    Signed and Held   Signed and Held  Phosphorus  Tomorrow morning,   R     Signed and Held   Signed and Held  TSH  Once,   R    Comments:  Cancel if already done within 1 month and notify MD    Signed and Held   Signed and Held  Comprehensive metabolic panel  Once,   R    Comments:  Cal MD for K<3.5 or >5.0    Signed and Held   Signed and Held  CBC  Once,   R    Comments:  Call for hg <8.0    Signed and Held      Vitals/Pain Today's Vitals   12/01/17 2025 12/01/17 2030 12/01/17 2045 12/01/17 2100  BP:  128/72  135/70  Pulse:  79 81 83  Resp:  17 17 18   Temp:      TempSrc:      SpO2:  (!) 89% 90% (!) 89%  Weight: 177 lb (80.3 kg)     Height: 5' 6"  (1.676 m)     PainSc:        Isolation Precautions No active isolations  Medications Medications  iopamidol (ISOVUE-300) 61 % injection (has no administration in time range)  potassium chloride 10 mEq in 100 mL IVPB (10 mEq Intravenous New Bag/Given 12/01/17 2028)  HYDROmorphone (DILAUDID) injection 0.5-1 mg (0.5 mg Intravenous Given 12/01/17 2022)  morphine 4  MG/ML injection 4 mg (4 mg Intravenous Given 12/01/17 1348)  ondansetron (ZOFRAN) injection 4 mg (4 mg Intravenous Given 12/01/17 1348)  sodium chloride 0.9 % bolus 1,000 mL (0 mLs Intravenous Stopped 12/01/17 1616)  potassium chloride 10 mEq in 100 mL IVPB (0 mEq Intravenous Stopped 12/01/17 1616)  magnesium sulfate IVPB 2 g 50 mL (0 g Intravenous Stopped 12/01/17 1804)  sodium chloride 0.9 % bolus 1,000 mL (0 mLs Intravenous Stopped 12/01/17 1830)  morphine 4 MG/ML injection 4 mg (4 mg Intravenous Given 12/01/17 1615)  iopamidol (ISOVUE-300) 61 % injection 100 mL (100 mLs Intravenous Contrast Given 12/01/17 1650)  sodium chloride 0.9 % bolus 1,000 mL (0 mLs Intravenous Stopped 12/01/17 1931)    Mobility walks

## 2017-12-01 NOTE — Telephone Encounter (Signed)
I suggest pt to be evaluated at [facility] ED today, please call pt. Thanks   Truitt Merle MD

## 2017-12-01 NOTE — ED Triage Notes (Signed)
Patient c/o abdominal pain with N/V, last BM x1 week ago. Hx colon cancer mets to lungs. States she is not passing gas.

## 2017-12-01 NOTE — Telephone Encounter (Signed)
"  My mother-n-law was seen Friday, did not receive anything but injection.  Nurse called [place] 5:00 pm, gave advice for the weekend instructing to call today if she is not feeling better.  She is not feeling better, pain continues."

## 2017-12-01 NOTE — H&P (Addendum)
Mary [name]:[id number] DOB: 02/07/1933 DOA: 12/01/2017     PCP: Moshe Cipro, MD   Outpatient Specialists:   Fabienne Bruns [facility]  Oncology  Dr. Burr Medico General surgery Dr. Johney [facility] Dr. Lorna Dibble [facility]iology [place] Patient arrived to ER on 12/01/17 at 1234  Patient coming from:    home Lives alone,       Chief Complaint:  Chief Complaint  Patient presents with  . Abdominal Pain  . Emesis    HPI: Mary [name] is a 84 y.o. female with medical history significant of Metastatic sigmoid colon Adenocarcinoma with colonic strictures status post stenting by [facility], metastases to the liver, metastasis to the lungs, left renal and left adnexal masses left hydronephrosis, history of paroxysmal atrial fibrillation, HTN, HLD      Presented with 1 week of constipation unable to pass any stool now unable to pass any flatus either she has tried to use MiraLAX and doubled her dose but no relief.  Last bowel movement was a week ago patient did endorse that she has been using oxycodone for pain.  Her abdomen continues to become distended and then she developed nausea and vomiting unable to keep anything by mouth.  Family called oncology who recommended for them to take patient to ER In the past patient has history of sigmoid colon stricture secondary to mass status post stent placement in December 2018 which was done for palliative purposes. Patient at that time declined surgical intervention.  Otherwise no fevers or chills no chest pain or shortness of breath no diarrhea   Noted 3 days ago she has been seen by oncology was noted to have hypokalemia which was replaced Regarding pertinent Chronic problems: Regarding colon cancer patient has been symptomatic since July 2017.  Patient currently on oral chemotherapy and IV antibody Vectibix  every 2 weeks History of atrial fibrillation currently on diltiazem and metoprolol PRN, not on anticoagulation due to Gi bleed While in ER: Was diagnosed  with a likely colonic stent failure and  hypokalemia which was replaced  Following Medications were ordered in ER: Medications  iopamidol (ISOVUE-300) 61 % injection (has no administration in time range)  sodium chloride 0.9 % bolus 1,000 mL (1,000 mLs Intravenous New Bag/Given 12/01/17 1830)  morphine 4 MG/ML injection 4 mg (4 mg Intravenous Given 12/01/17 1348)  ondansetron (ZOFRAN) injection 4 mg (4 mg Intravenous Given 12/01/17 1348)  sodium chloride 0.9 % bolus 1,000 mL (0 mLs Intravenous Stopped 12/01/17 1616)  potassium chloride 10 mEq in 100 mL IVPB (0 mEq Intravenous Stopped 12/01/17 1616)  magnesium sulfate IVPB 2 g 50 mL (0 g Intravenous Stopped 12/01/17 1804)  sodium chloride 0.9 % bolus 1,000 mL (0 mLs Intravenous Stopped 12/01/17 1830)  morphine 4 MG/ML injection 4 mg (4 mg Intravenous Given 12/01/17 1615)  iopamidol (ISOVUE-300) 61 % injection 100 mL (100 mLs Intravenous Contrast Given 12/01/17 1650)    Significant initial  Findings: Abnormal Labs Reviewed  CBC WITH DIFFERENTIAL/PLATELET - Abnormal; Notable for the following components:      Result Value   RDW 16.4 (*)    Platelets 402 (*)    Monocytes Absolute 1.8 (*)    All other components within normal limits  COMPREHENSIVE METABOLIC PANEL - Abnormal; Notable for the following components:   Potassium 2.9 (*)    Glucose, Bld 124 (*)    Albumin 3.3 (*)    All other components within normal limits  URINALYSIS, ROUTINE W REFLEX MICROSCOPIC - Abnormal; Notable  for the following components:   Specific Gravity, Urine 1.039 (*)    All other components within normal limits     Na 137 K 2.9 Mg 1.7 Cr   stable,    Lab Results  Component Value Date   CREATININE 0.69 12/01/2017   CREATININE 0.82 11/28/2017   CREATININE 0.80 11/13/2017      WBC  6.6  HG/HCT   stable,      Component Value Date/Time   HGB 12.4 12/01/2017 1349   HGB 14.9 08/07/2017 1248   HCT 36.9 12/01/2017 1349   HCT 46.3 08/07/2017 1248      UA   no evidence of UTI       CTabd/pelvis -tubal obstruction at the level of sigmoid colon stent with diffuse dilation of the operative stream: ECG:  Personally reviewed by me showing: HR :  95 Rhythm:  A.fib.    no evidence of ischemic changes QTC 566     ED Triage Vitals [12/01/17 1242]  Enc Vitals Group     BP (!) 134/97     Pulse Rate (!) 108     Resp 18     Temp 98.7 F (37.1 C)     Temp Source Oral     SpO2 95 %     Weight      Height      Head Circumference      Peak Flow      Pain Score 5     Pain Loc      Pain Edu?      Excl. in [place]?   PZWC(58)@       Latest  Blood pressure 123/79, pulse 84, temperature 98.7 F (37.1 C), temperature source Oral, resp. rate 14, SpO2 90 %.    ER Provider Called:  Sadie Haber   Dr.Karki They Recommend admit for rehydration, palliative care vs surgical intervention Will see in AM   Hospitalist was called for admission for colon cancer with colonic stricture status post stenting which currently has failed resulting in colonic obstruction   Review of Systems:    Pertinent positives include: abdominal pain, nausea, vomiting,  Constitutional:  No weight loss, night sweats, Fevers, chills, fatigue, weight loss  HEENT:  No headaches, Difficulty swallowing,Tooth/dental problems,Sore throat,  No sneezing, itching, ear ache, nasal congestion, post nasal drip,  Cardio-vascular:  No chest pain, Orthopnea, PND, anasarca, dizziness, palpitations.no Bilateral lower extremity swelling  GI:  No heartburn, indigestion,  diarrhea, change in bowel habits, loss of appetite, melena, blood in stool, hematemesis Resp:  no shortness of breath at rest. No dyspnea on exertion, No excess mucus, no productive cough, No non-productive cough, No coughing up of blood.No change in color of mucus.No wheezing. Skin:  no rash or lesions. No jaundice GU:  no dysuria, change in color of urine, no urgency or frequency. No straining to urinate.  No flank pain.    Musculoskeletal:  No joint pain or no joint swelling. No decreased range of motion. No back pain.  Psych:  No change in mood or affect. No depression or anxiety. No memory loss.  Neuro: no localizing neurological complaints, no tingling, no weakness, no double vision, no gait abnormality, no slurred speech, no confusion  As per HPI otherwise 10 point review of systems negative.   Past Medical History:   Past Medical History:  Diagnosis Date  . Aneurysm of ascending aorta (HCC) 12/03/2016   3.7 cm by echo 10/2016  . Colon cancer ([place])  dx via biospy from colonoscopy-- malignant ---  currently having work-up done w/ dr gross (general surgeon) and coordinate surgery w/ urologsit for renal tumor  . Essential hypertension 10/31/2016  . GERD (gastroesophageal reflux disease)   . Glaucoma   . Hiatal hernia   . HOH (hard of hearing)    bilateral even w/ hearing aids  . Hyperlipidemia   . Kidney tumor    left side  . Legally blind in right eye, as defined in [place]    due to macular degeneration  . Macular degeneration   . Nausea    intermittant due to colon mass  . Ovarian cyst   . Persistent atrial fibrillation ([place]) 10/31/2016   on ASA only  . PONV (postoperative nausea and vomiting)   . Wears glasses   . Wears hearing aid    bilateral      Past Surgical History:  Procedure Laterality Date  . BREAST CYST EXCISION  1990   benign  . COLONOSCOPY  09/04/2016  . CYSTOSCOPY WITH RETROGRADE PYELOGRAM, URETEROSCOPY AND STENT PLACEMENT Left 10/10/2016   Procedure: CYSTOSCOPY WITH RETROGRADE PYELOGRAM, DIAGNOSTIC URETEROSCOPY  AND STENT PLACEMENT;  Surgeon: Alexis Frock, MD;  Location: [facility];  Service: Urology;  Laterality: Left;  . ESOPHAGOGASTRODUODENOSCOPY  11/07/2014  . FLEXIBLE SIGMOIDOSCOPY N/A 08/11/2017   Procedure: FLEXIBLE SIGMOIDOSCOPY;  Surgeon: Clarene Essex, MD;  Location: [facility];  Service: Endoscopy;  Laterality: N/A;  With Fluoroscopy for  colonic stent placement for colonic stricture  . NASAL SINUS SURGERY    . TONSILLECTOMY      Social History:  Ambulatory  independently      reports that she has never smoked. She has never used smokeless tobacco. She reports that she does not drink alcohol or use drugs.   Family History:   Family History  Problem Relation Age of Onset  . Cancer Sister 70       breast cancer   . Cancer Brother 34       lung cancer   . Diabetes Son   . High blood pressure Son   . Peripheral Artery Disease Mother   . High blood pressure Mother     Allergies: Allergies  Allergen Reactions  . Benazepril     COUGH   . Tylenol [Acetaminophen]     Anxiety   . Bactrim [Sulfamethoxazole-Trimethoprim] Nausea And Vomiting     Prior to Admission medications   Medication Sig Start Date End Date Taking? Authorizing Provider  amLODipine (NORVASC) 5 MG tablet Take 1 tablet (5 mg total) by mouth daily. 03/04/17  Yes Skeet Latch, MD  Bisacodyl (DULCOLAX PO) Take 2 tablets by mouth 2 (two) times daily.   Yes [provider]  bisacodyl (DULCOLAX) 10 MG suppository Place 10 mg rectally as needed for moderate constipation.   Yes [provider]  capecitabine (XELODA) 500 MG tablet Take 4 tablets ( 2000 mg ) in  AM ;  3 tablets ( 1500 mg ) in  PM for  7 days on ,  Off 7 days. 11/24/17  Yes Truitt Merle, MD  diltiazem (CARDIZEM CD) 240 MG 24 hr capsule Take 1 capsule (240 mg total) by mouth daily. 05/28/17  Yes Skeet Latch, MD  docusate sodium (COLACE) 100 MG capsule Take 1 capsule (100 mg total) by mouth 2 (two) times daily. 10/31/17  Yes Truitt Merle, MD  escitalopram (LEXAPRO) 10 MG tablet Take 10 mg by mouth daily.   Yes [provider]  fexofenadine (  ALLEGRA) 180 MG tablet Take 180 mg by mouth daily.   Yes [provider]  furosemide (LASIX) 40 MG tablet Take 1 tablet (40 mg total) by mouth daily. 09/29/17  Yes Skeet Latch, MD  latanoprost (XALATAN) 0.005 %  ophthalmic solution Place 1 drop into both eyes at bedtime.   Yes [provider]  losartan (COZAAR) 100 MG tablet Take 1 tablet (100 mg total) by mouth daily. 09/29/17  Yes Skeet Latch, MD  Multiple Vitamins-Minerals (VITEYES AREDS ADVANCED) CAPS Take 1 capsule by mouth daily.   Yes [provider]  omeprazole (PRILOSEC) 40 MG capsule Take 40 mg by mouth daily.   Yes [provider]  ondansetron (ZOFRAN ODT) 4 MG disintegrating tablet Take 1 tablet (4 mg total) by mouth every 8 (eight) hours as needed for nausea or vomiting. 07/09/17  Yes Truitt Merle, MD  oxyCODONE (OXY IR/ROXICODONE) 5 MG immediate release tablet Take 1 tablet (5 mg total) by mouth every 6 (six) hours as needed for moderate pain or severe pain. 11/13/17  Yes Truitt Merle, MD  Polyethyl Glycol-Propyl Glycol (SYSTANE) 0.4-0.3 % SOLN Apply 1 drop to eye daily as needed (dry eyes).    Yes [provider]  polyethylene glycol (MIRALAX / GLYCOLAX) packet Take 17 g by mouth daily as needed for moderate constipation. 08/13/17  Yes Bonnielee Haff, MD  Potassium Chloride ER 20 MEQ TBCR Take 20 mEq by mouth daily. 09/04/17  Yes Truitt Merle, MD  sennosides-docusate sodium (SENOKOT-S) 8.6-50 MG tablet Take 1-2 tablets by mouth 3 (three) times daily.   Yes [provider]  urea (CARMOL) 10 % cream Apply topically as needed. 10/31/17  Yes Truitt Merle, MD  clindamycin (CLINDAGEL) 1 % gel Apply topically 2 (two) times daily. Patient not taking: Reported on 12/01/2017 07/24/17   Truitt Merle, MD  lactulose (CEPHULAC) 20 g packet Take 1 packet (20 g total) by mouth every 4 (four) hours as needed (severe constipation). Patient not taking: Reported on 12/01/2017 11/28/17   Truitt Merle, MD  metoprolol tartrate (LOPRESSOR) 25 MG tablet 1/2 TABLET BY MOUTH AS NEEDED FOR PALPITATIONS Patient not taking: Reported on 12/01/2017 07/08/17   Skeet Latch, MD   Physical Exam: Blood pressure 123/79, pulse 84, temperature 98.7 F  (37.1 C), temperature source Oral, resp. rate 14, SpO2 90 %. 1. General:  in No Acute distress  Chronically ill  -appearing 2. Psychological: Alert and  Oriented 3. Head/ENT:    Dry Mucous Membranes                          Head Non traumatic, neck supple                            Poor Dentition 4. SKIN:  decreased Skin turgor,  Skin clean Dry and intact no rash 5. Heart: Regular rate and rhythm no  Murmur, no Rub or gallop 6. Lungs:   no wheezes or crackles   7. Abdomen: Soft, tender,   Distended bowel sounds diminished 8. Lower extremities: no clubbing, cyanosis, or edema 9. Neurologically Grossly intact, moving all 4 extremities equally   10. MSK: Normal range of motion   LABS:     Recent Labs  Lab 11/28/17 0842 12/01/17 1349  WBC 8.9 6.6  NEUTROABS 5.1 3.5  HGB 11.6 12.4  HCT 35.7 36.9  MCV 97.3 94.9  PLT 324 191*   Basic Metabolic Panel:  Recent Labs  Lab 11/28/17 0842 12/01/17 1349  NA 138 137  K 3.0* 2.9*  CL 105 102  CO2 25 26  GLUCOSE 86 124*  BUN 11 14  CREATININE 0.82 0.69  CALCIUM 9.0 9.2  MG 1.7 1.7      Recent Labs  Lab 11/28/17 0842 12/01/17 1349  AST 51* 24  ALT 34 32  ALKPHOS 128 103  BILITOT 0.9 0.7  PROT 7.5 7.0  ALBUMIN 3.4* 3.3*   Recent Labs  Lab 12/01/17 1349  LIPASE 31   No results for input(s): AMMONIA in the last 168 hours.    HbA1C: No results for input(s): HGBA1C in the last 72 hours. CBG: No results for input(s): GLUCAP in the last 168 hours.    Urine analysis:    Component Value Date/Time   COLORURINE YELLOW 12/01/2017 [place] 12/01/2017 1840   LABSPEC 1.039 (H) 12/01/2017 1840   PHURINE 6.0 12/01/2017 1840   GLUCOSEU NEGATIVE 12/01/2017 1840   HGBUR NEGATIVE 12/01/2017 1840   BILIRUBINUR NEGATIVE 12/01/2017 1840   KETONESUR NEGATIVE 12/01/2017 1840   PROTEINUR NEGATIVE 12/01/2017 1840   NITRITE NEGATIVE 12/01/2017 1840   LEUKOCYTESUR NEGATIVE 12/01/2017 1840       Cultures: No  results found for: SDES, SPECREQUEST, CULT, REPTSTATUS   Radiological Exams on Admission: Ct Abdomen Pelvis W Contrast  Result Date: 12/01/2017 CLINICAL DATA:  Abdominal pain, nausea vomiting. No bowel movement for 1 week. EXAM: CT ABDOMEN AND PELVIS WITH CONTRAST TECHNIQUE: Multidetector CT imaging of the abdomen and pelvis was performed using the standard protocol following bolus administration of intravenous contrast. CONTRAST:  163mL ISOVUE-300 IOPAMIDOL (ISOVUE-300) INJECTION 61% COMPARISON:  11/07/2017 FINDINGS: Lower chest: Known bilateral metastatic pulmonary nodules. The largest in the right lung base measuring 1.1 cm, stable. The largest in the left lung base measuring 1.4 cm, decreased from 2.2 cm on most recent body CT, image 26/63, sequence 4. Hepatobiliary: Known hepatic metastases, 1 in the dome of the liver and 1 in the lateral aspect of the left lobe of the liver are more indistinct and appears somewhat smaller. Cholelithiasis. No evidence of acute cholecystitis. Pancreas: Unremarkable. No pancreatic ductal dilatation or surrounding inflammatory changes. Spleen: Normal in size without focal abnormality. Adrenals/Urinary Tract: Normal appearance of the adrenal glands and right kidney. Ill-defined 2.2 x 2.4 cm mass in the left renal hilum, stable. No evidence of hydronephrosis. Stomach/Bowel: There is diffuse dilation of the colon which demonstrates featureless appearance and liquid contents to the level of the distal sigmoid colon stent. Vascular/Lymphatic: Aortic atherosclerosis. No enlarged abdominal or pelvic lymph nodes. Reproductive: Unchanged appearance of left adnexal mass. Normal right adnexa. Postcholecystectomy. Other: No abdominal wall hernia or abnormality. No abdominopelvic ascites. Musculoskeletal: No acute or significant osseous findings. Multilevel osteoarthritic changes. IMPRESSION: Probable obstruction at the level of the sigmoid colon stent with diffuse dilation of the  upstream colon. The small bowel is fluid-filled but demonstrates normal caliber. Somewhat decreased in size known hepatic metastatic disease. Stable in size known left renal mass. Stable left adnexal mass. Decreased in size left lung base pulmonary metastatic nodule. Electronically Signed   By: Fidela Salisbury M.D.   On: 12/01/2017 17:32    Chart has been reviewed    Assessment/Plan  84 y.o. female with medical history significant of Metastatic sigmoid colon Adenocarcinoma with colonic strictures status post stenting by [facility], metastases to the liver, metastasis to the lungs, left renal and left adnexal masses left hydronephrosis, history of paroxysmal  atrial fibrillation, HTN, HLD  Admitted for colon cancer with colonic stricture status post stenting which currently has failed resulting in colonic obstruction  Present on Admission:  . Colon obstruction ([place])  -I spent a total of 18 minutes face-to-face discussing goals of care with patient and her daughter-in-law.  At this point patient wishes to have discussion with GI in a.m. regarding her future options also would like palliative care consult at the same time.  After that they will make a decision whether she would ever consider any surgical interventions or not.  For now we will keep n.p.o. except for home medications.  Pain management . Cancer of sigmoid colon [facility]) -now with recurrent colonic obstruction.  We will email oncology to notify patient has been admitted Hypo-kalemia will replace check magnesium level and replace as needed Dehydration we will rehydrate . Essential hypertension continue diltiazem and Norvasc if able to tolerate . Hyperlipidemia -stable hold off on statins for now to try to minimize p.o. medication intake . Persistent atrial fibrillation (HCC) -           - CHA2DS2 vas score 3:  Not on anticoagulation secondary to Risk of   recurrent bleeding         -  Rate control:  Currently controlled with   Diltiazem,   will continue Prolonged QT monitor on telemetry avoid QT prolonging medications if possible Other plan as per orders.  DVT prophylaxis:  SCD    Code Status:    DNR/DNI  per patient   I had personally discussed CODE STATUS with patient and family  I had spent 41min discussing goals of care and CODE STATUS  Family Communication:   Family   at  Bedside  plan of care was discussed with daughter in law, Wife, Husband, Sister, Brother , father, mother  Disposition Plan:       To home once workup is complete and patient is stable    Palliative care   consulted                          Consults called: [facility] is aware, oncology notified through email  Admission status:    inpatient      Level of care     tele          Toy Baker 12/01/2017, 8:21 PM    [facility]  Pager [phone]   after 2 AM please page floor coverage PA If 7AM-7PM, please contact the day team taking care of the patient  [email or web address]  Password [email or web address]

## 2017-12-01 NOTE — ED Provider Notes (Signed)
[facility] DEPT Provider Note   CSN: [id number] Arrival date & time: 12/01/17  1234     History   Chief Complaint Chief Complaint  Patient presents with  . Abdominal Pain  . Emesis    HPI YTZEL GUBLER is a 84 y.o. female history of atrial fibrillation on aspirin, hyperlipidemia, hypertension, GERD, colon cancer on cevtibix and xeloda here for evaluation of constipation, last bowel movement 1 week ago. She is not passing gas. Associated symptoms include nausea, nonbilious non-bloody emesis, abdominal distention, and generalized abdominal pain most significant at lower abdomen. She has had intermittent abdominal pain that she attributes to her colon cancer for several weeks however pain acutely worsened 2-3 days ago, now having take pain medication. Pain is achy, generalized, radiates into upper abdomen and chest. Oxycodone provides temporary relief of pain. He had scheduled cancer infusion and then 3 days ago but did not receive treatment due to constipation, was found to be hypokalemic and received IV potassium. Oncologist advised her to double up on MiraLAX, Dulcolax pills and suppositories, liquid diet which she has been compliant 2 over the weekend without relief. Last solid food and take 3 days ago. Has been taking small sips of water but any oral intake makes her nauseated, regurgitate and vomit.  No fever, shortness of breath, chest pain, cough, dysuria, hematuria. Oncologist Dr. Burr Medico.  HPI  Past Medical History:  Diagnosis Date  . Aneurysm of ascending aorta (HCC) 12/03/2016   3.7 cm by echo 10/2016  . Colon cancer [facility])    dx via biospy from colonoscopy-- malignant ---  currently having work-up done w/ dr gross (general surgeon) and coordinate surgery w/ urologsit for renal tumor  . Essential hypertension 10/31/2016  . GERD (gastroesophageal reflux disease)   . Glaucoma   . Hiatal hernia   . HOH (hard of hearing)    bilateral even w/ hearing  aids  . Hyperlipidemia   . Kidney tumor    left side  . Legally blind in right eye, as defined in [place]    due to macular degeneration  . Macular degeneration   . Nausea    intermittant due to colon mass  . Ovarian cyst   . Persistent atrial fibrillation ([place]) 10/31/2016   on ASA only  . PONV (postoperative nausea and vomiting)   . Wears glasses   . Wears hearing aid    bilateral    Patient Active Problem List   Diagnosis Date Noted  . Colon stricture ([place])   . Diarrhea   . Hyperglycemia 08/08/2017  . Rectosigmoid stricture from obstructing cancer s/p endoscopic stenting 08/11/2017 08/07/2017  . Bowel obstruction ([place]) 08/07/2017  . Goals of care, counseling/discussion 12/05/2016  . Aneurysm of ascending aorta (HCC) 12/03/2016  . Persistent atrial fibrillation ([place]) 10/31/2016  . Essential hypertension 10/31/2016  . Hyperlipidemia 10/31/2016  . Cancer of sigmoid colon ([place]) 10/25/2016    Past Surgical History:  Procedure Laterality Date  . BREAST CYST EXCISION  1990   benign  . COLONOSCOPY  09/04/2016  . CYSTOSCOPY WITH RETROGRADE PYELOGRAM, URETEROSCOPY AND STENT PLACEMENT Left 10/10/2016   Procedure: CYSTOSCOPY WITH RETROGRADE PYELOGRAM, DIAGNOSTIC URETEROSCOPY  AND STENT PLACEMENT;  Surgeon: Alexis Frock, MD;  Location: [facility];  Service: Urology;  Laterality: Left;  . ESOPHAGOGASTRODUODENOSCOPY  11/07/2014  . FLEXIBLE SIGMOIDOSCOPY N/A 08/11/2017   Procedure: FLEXIBLE SIGMOIDOSCOPY;  Surgeon: Clarene Essex, MD;  Location: [facility];  Service: Endoscopy;  Laterality: N/A;  With Fluoroscopy for  colonic stent placement for colonic stricture  . NASAL SINUS SURGERY    . TONSILLECTOMY       OB History   None      Home Medications    Prior to Admission medications   Medication Sig Start Date End Date Taking? Authorizing Provider  amLODipine (NORVASC) 5 MG tablet Take 1 tablet (5 mg total) by mouth daily. 03/04/17  Yes Skeet Latch, MD    Bisacodyl (DULCOLAX PO) Take 2 tablets by mouth 2 (two) times daily.   Yes [provider]  bisacodyl (DULCOLAX) 10 MG suppository Place 10 mg rectally as needed for moderate constipation.   Yes [provider]  capecitabine (XELODA) 500 MG tablet Take 4 tablets ( 2000 mg ) in  AM ;  3 tablets ( 1500 mg ) in  PM for  7 days on ,  Off 7 days. 11/24/17  Yes Truitt Merle, MD  diltiazem (CARDIZEM CD) 240 MG 24 hr capsule Take 1 capsule (240 mg total) by mouth daily. 05/28/17  Yes Skeet Latch, MD  docusate sodium (COLACE) 100 MG capsule Take 1 capsule (100 mg total) by mouth 2 (two) times daily. 10/31/17  Yes Truitt Merle, MD  escitalopram (LEXAPRO) 10 MG tablet Take 10 mg by mouth daily.   Yes [provider]  fexofenadine (ALLEGRA) 180 MG tablet Take 180 mg by mouth daily.   Yes [provider]  furosemide (LASIX) 40 MG tablet Take 1 tablet (40 mg total) by mouth daily. 09/29/17  Yes Skeet Latch, MD  latanoprost (XALATAN) 0.005 % ophthalmic solution Place 1 drop into both eyes at bedtime.   Yes [provider]  losartan (COZAAR) 100 MG tablet Take 1 tablet (100 mg total) by mouth daily. 09/29/17  Yes Skeet Latch, MD  Multiple Vitamins-Minerals (VITEYES AREDS ADVANCED) CAPS Take 1 capsule by mouth daily.   Yes [provider]  omeprazole (PRILOSEC) 40 MG capsule Take 40 mg by mouth daily.   Yes [provider]  ondansetron (ZOFRAN ODT) 4 MG disintegrating tablet Take 1 tablet (4 mg total) by mouth every 8 (eight) hours as needed for nausea or vomiting. 07/09/17  Yes Truitt Merle, MD  oxyCODONE (OXY IR/ROXICODONE) 5 MG immediate release tablet Take 1 tablet (5 mg total) by mouth every 6 (six) hours as needed for moderate pain or severe pain. 11/13/17  Yes Truitt Merle, MD  Polyethyl Glycol-Propyl Glycol (SYSTANE) 0.4-0.3 % SOLN Apply 1 drop to eye daily as needed (dry eyes).    Yes [provider]  polyethylene glycol (MIRALAX /  GLYCOLAX) packet Take 17 g by mouth daily as needed for moderate constipation. 08/13/17  Yes Bonnielee Haff, MD  Potassium Chloride ER 20 MEQ TBCR Take 20 mEq by mouth daily. 09/04/17  Yes Truitt Merle, MD  sennosides-docusate sodium (SENOKOT-S) 8.6-50 MG tablet Take 1-2 tablets by mouth 3 (three) times daily.   Yes [provider]  urea (CARMOL) 10 % cream Apply topically as needed. 10/31/17  Yes Truitt Merle, MD  clindamycin (CLINDAGEL) 1 % gel Apply topically 2 (two) times daily. Patient not taking: Reported on 12/01/2017 07/24/17   Truitt Merle, MD  lactulose (CEPHULAC) 20 g packet Take 1 packet (20 g total) by mouth every 4 (four) hours as needed (severe constipation). Patient not taking: Reported on 12/01/2017 11/28/17   Truitt Merle, MD  metoprolol tartrate (LOPRESSOR) 25 MG tablet 1/2 TABLET BY MOUTH AS NEEDED FOR PALPITATIONS Patient not taking: Reported on 12/01/2017 07/08/17   Oval Linsey,  Jonelle Sidle, MD    Family History Family History  Problem Relation Age of Onset  . Cancer Sister 89       breast cancer   . Cancer Brother 107       lung cancer   . Diabetes Son   . High blood pressure Son   . Peripheral Artery Disease Mother   . High blood pressure Mother     Social History Social History   Tobacco Use  . Smoking status: Never Smoker  . Smokeless tobacco: Never Used  Substance Use Topics  . Alcohol use: No  . Drug use: No     Allergies   Benazepril; Tylenol [acetaminophen]; and Bactrim [sulfamethoxazole-trimethoprim]   Review of Systems Review of Systems  Constitutional: Positive for appetite change.  Gastrointestinal: Positive for abdominal distention, abdominal pain, constipation, nausea and vomiting.  Allergic/Immunologic: Positive for immunocompromised state.  All other systems reviewed and are negative.    Physical Exam Updated Vital Signs BP 123/79   Pulse 84   Temp 98.7 F (37.1 C) (Oral)   Resp 14   SpO2 90%   Physical Exam  Constitutional: She is  oriented to person, place, and time. She appears well-developed and well-nourished. No distress.  Looks uncomfortable  HENT:  Head: Normocephalic and atraumatic.  Nose: Nose normal.  Mouth/Throat: Oropharynx is clear and moist. No oropharyngeal exudate.  Dry mucous membranes   Eyes: Pupils are equal, round, and reactive to light. Conjunctivae and EOM are normal.  Neck: Normal range of motion. Neck supple.  Cardiovascular: Regular rhythm, normal heart sounds and intact distal pulses.  No murmur heard. Tachycardic. 2+ DP and radial pulses bilaterally. No LE edema.   Pulmonary/Chest: Effort normal and breath sounds normal. No respiratory distress. She has no wheezes. She has no rales. She exhibits no tenderness.  Abdominal: Soft. She exhibits distension. Bowel sounds are absent. There is generalized tenderness.  Lower abdominal distention and rigidity, and generalized mild tenderness. No guarding. No bowel sounds. No suprapubic or CVA tenderness.  Genitourinary:  Genitourinary Comments: PA student present to chaperone during exam. I was able to palpate the first 2-3cm of the rectum digitally without gross abnormalities or masses. Patient able to tolerate examination.  Patient with no pain in perianal/rectal area with palpation.  2 external, non tender, non thrombosed hemorrhoids at 3 o'clock No induration or swelling of the perianal skin.   No palpated stool color in distal rectal vault, no gross blood noted.   No signs of perirectal abscess.   DRE reveals good sphincter tone.    Musculoskeletal: Normal range of motion. She exhibits no deformity.  Lymphadenopathy:    She has no cervical adenopathy.  Neurological: She is alert and oriented to person, place, and time. No sensory deficit.  Skin: Skin is warm and dry. Capillary refill takes less than 2 seconds.  Psychiatric: She has a normal mood and affect. Her behavior is normal. Judgment and thought content normal.  Nursing note and vitals  reviewed.    ED Treatments / Results  Labs (all labs ordered are listed, but only abnormal results are displayed) Labs Reviewed  CBC WITH DIFFERENTIAL/PLATELET - Abnormal; Notable for the following components:      Result Value   RDW 16.4 (*)    Platelets 402 (*)    Monocytes Absolute 1.8 (*)    All other components within normal limits  COMPREHENSIVE METABOLIC PANEL - Abnormal; Notable for the following components:   Potassium 2.9 (*)    Glucose,  Bld 124 (*)    Albumin 3.3 (*)    All other components within normal limits  LIPASE, BLOOD  MAGNESIUM  URINALYSIS, ROUTINE W REFLEX MICROSCOPIC    EKG EKG Interpretation  Date/Time:  Monday December 01 2017 15:24:00 EDT Ventricular Rate:  95 PR Interval:    QRS Duration: 64 QT Interval:  450 QTC Calculation: 566 R Axis:   -51 Text Interpretation:  Atrial fibrillation Left anterior fascicular block Low voltage, precordial leads Borderline repolarization abnormality Prolonged QT interval No STEMI.  Confirmed by Nanda Quinton [phone]) on 12/01/2017 3:38:52 PM   EKG Interpretation  Date/Time:  Monday December 01 2017 15:24:00 EDT Ventricular Rate:  95 PR Interval:    QRS Duration: 64 QT Interval:  450 QTC Calculation: 566 R Axis:   -51 Text Interpretation:  Atrial fibrillation Left anterior fascicular block Low voltage, precordial leads Borderline repolarization abnormality Prolonged QT interval No STEMI.  Confirmed by Nanda Quinton [phone]) on 12/01/2017 3:38:52 PM   Radiology Ct Abdomen Pelvis W Contrast  Result Date: 12/01/2017 CLINICAL DATA:  Abdominal pain, nausea vomiting. No bowel movement for 1 week. EXAM: CT ABDOMEN AND PELVIS WITH CONTRAST TECHNIQUE: Multidetector CT imaging of the abdomen and pelvis was performed using the standard protocol following bolus administration of intravenous contrast. CONTRAST:  125mL ISOVUE-300 IOPAMIDOL (ISOVUE-300) INJECTION 61% COMPARISON:  11/07/2017 FINDINGS: Lower chest: Known bilateral  metastatic pulmonary nodules. The largest in the right lung base measuring 1.1 cm, stable. The largest in the left lung base measuring 1.4 cm, decreased from 2.2 cm on most recent body CT, image 26/63, sequence 4. Hepatobiliary: Known hepatic metastases, 1 in the dome of the liver and 1 in the lateral aspect of the left lobe of the liver are more indistinct and appears somewhat smaller. Cholelithiasis. No evidence of acute cholecystitis. Pancreas: Unremarkable. No pancreatic ductal dilatation or surrounding inflammatory changes. Spleen: Normal in size without focal abnormality. Adrenals/Urinary Tract: Normal appearance of the adrenal glands and right kidney. Ill-defined 2.2 x 2.4 cm mass in the left renal hilum, stable. No evidence of hydronephrosis. Stomach/Bowel: There is diffuse dilation of the colon which demonstrates featureless appearance and liquid contents to the level of the distal sigmoid colon stent. Vascular/Lymphatic: Aortic atherosclerosis. No enlarged abdominal or pelvic lymph nodes. Reproductive: Unchanged appearance of left adnexal mass. Normal right adnexa. Postcholecystectomy. Other: No abdominal wall hernia or abnormality. No abdominopelvic ascites. Musculoskeletal: No acute or significant osseous findings. Multilevel osteoarthritic changes. IMPRESSION: Probable obstruction at the level of the sigmoid colon stent with diffuse dilation of the upstream colon. The small bowel is fluid-filled but demonstrates normal caliber. Somewhat decreased in size known hepatic metastatic disease. Stable in size known left renal mass. Stable left adnexal mass. Decreased in size left lung base pulmonary metastatic nodule. Electronically Signed   By: Fidela Salisbury M.D.   On: 12/01/2017 17:32    Procedures Procedures (including critical care time)  Medications Ordered in ED Medications  iopamidol (ISOVUE-300) 61 % injection (has no administration in time range)  sodium chloride 0.9 % bolus 1,000 mL  (has no administration in time range)  morphine 4 MG/ML injection 4 mg (4 mg Intravenous Given 12/01/17 1348)  ondansetron (ZOFRAN) injection 4 mg (4 mg Intravenous Given 12/01/17 1348)  sodium chloride 0.9 % bolus 1,000 mL (0 mLs Intravenous Stopped 12/01/17 1616)  potassium chloride 10 mEq in 100 mL IVPB (0 mEq Intravenous Stopped 12/01/17 1616)  magnesium sulfate IVPB 2 g 50 mL (0 g Intravenous Stopped 12/01/17 1804)  sodium chloride 0.9 % bolus 1,000 mL (1,000 mLs Intravenous New Bag/Given 12/01/17 1615)  morphine 4 MG/ML injection 4 mg (4 mg Intravenous Given 12/01/17 1615)  iopamidol (ISOVUE-300) 61 % injection 100 mL (100 mLs Intravenous Contrast Given 12/01/17 1650)     Initial Impression / Assessment and Plan / ED Course  I have reviewed the triage vital signs and the nursing notes.  Pertinent labs & imaging results that were available during my care of the patient were reviewed by me and considered in my medical decision making (see chart for details).  Clinical Course as of Dec 02 1822  Mon Dec 01, 2017  1450 Potassium(!): 2.9 [CG]  1746 IMPRESSION: Probable obstruction at the level of the sigmoid colon stent with diffuse dilation of the upstream colon. The small bowel is fluid-filled but demonstrates normal caliber.  Somewhat decreased in size known hepatic metastatic disease.  Stable in size known left renal mass.  Stable left adnexal mass.  Decreased in size left lung base pulmonary metastatic nodule.  CT ABDOMEN PELVIS W CONTRAST [CG]    Clinical Course User Index [CG] Kinnie Feil, PA-C   Differential diagnosis includes SBO, impaction, perforated viscus, medication induced constipation. She has history of AAA however this is not that clinical picture as she has mild tenderness. No suprapubic or CVA tenderness or urinary symptoms to suggest UTI, [place], kidney stone. We will order labs, urinalysis, form rectal exam.  1535:  Labs and EKG reviewed, remarkable  for hypokalemia K 2.9. Patient received 20 mqE of K three days ago. Rectal exam without impaction or palpable stool burden. Will order CT abdomen/pelvis. Patient discussed with Dr. Laverta Baltimore. Final Clinical Impressions(s) / ED Diagnoses   1815: CT scan reviewed shows likely obstruction at level of colon stent with upstream dilation.  Symptoms adequately controlled in ED w/o episodes of emesis. Spoke to GI (Dr. Therisa Doyne) who anticipates this is likely stent failure, if pt desires more invasive intervention consult general surgery for recommendations. Spoke to pt and sister in law who are contemplating palliative/hospice care at this time, however would like more time to decide. Will request  admission for sigmoid colon obstruction likely from stent failure and hypokalemia,  Final diagnoses:  Generalized abdominal pain    ED Discharge Orders    None       Arlean Hopping 12/01/17 Melynda Ripple, MD 12/02/17 [phone]

## 2017-12-01 NOTE — Telephone Encounter (Signed)
Spoke with [name] informed her to take patient directly to [facility] ED now for evaluation, she verbalized an understanding.

## 2017-12-02 ENCOUNTER — Inpatient Hospital Stay (HOSPITAL_COMMUNITY): Payer: Medicare Other

## 2017-12-02 ENCOUNTER — Encounter (HOSPITAL_COMMUNITY): Payer: Self-pay

## 2017-12-02 DIAGNOSIS — D630 Anemia in neoplastic disease: Secondary | ICD-10-CM

## 2017-12-02 DIAGNOSIS — K56609 Unspecified intestinal obstruction, unspecified as to partial versus complete obstruction: Secondary | ICD-10-CM

## 2017-12-02 DIAGNOSIS — I4891 Unspecified atrial fibrillation: Secondary | ICD-10-CM

## 2017-12-02 DIAGNOSIS — C187 Malignant neoplasm of sigmoid colon: Principal | ICD-10-CM

## 2017-12-02 DIAGNOSIS — C7801 Secondary malignant neoplasm of right lung: Secondary | ICD-10-CM

## 2017-12-02 DIAGNOSIS — I10 Essential (primary) hypertension: Secondary | ICD-10-CM

## 2017-12-02 DIAGNOSIS — C7802 Secondary malignant neoplasm of left lung: Secondary | ICD-10-CM

## 2017-12-02 DIAGNOSIS — C787 Secondary malignant neoplasm of liver and intrahepatic bile duct: Secondary | ICD-10-CM

## 2017-12-02 DIAGNOSIS — G893 Neoplasm related pain (acute) (chronic): Secondary | ICD-10-CM

## 2017-12-02 LAB — CBC
HCT: 36 % (ref 36.0–46.0)
Hemoglobin: 11.6 g/dL — ABNORMAL LOW (ref 12.0–15.0)
MCH: 31.1 pg (ref 26.0–34.0)
MCHC: 32.2 g/dL (ref 30.0–36.0)
MCV: 96.5 fL (ref 78.0–100.0)
PLATELETS: 386 10*3/uL (ref 150–400)
RBC: 3.73 MIL/uL — ABNORMAL LOW (ref 3.87–5.11)
RDW: 16.6 % — ABNORMAL HIGH (ref 11.5–15.5)
WBC: 5.9 10*3/uL (ref 4.0–10.5)

## 2017-12-02 LAB — COMPREHENSIVE METABOLIC PANEL
ALK PHOS: 94 U/L (ref 38–126)
ALT: 30 U/L (ref 14–54)
AST: 25 U/L (ref 15–41)
Albumin: 3 g/dL — ABNORMAL LOW (ref 3.5–5.0)
Anion gap: 9 (ref 5–15)
BILIRUBIN TOTAL: 0.6 mg/dL (ref 0.3–1.2)
BUN: 11 mg/dL (ref 6–20)
CALCIUM: 8.3 mg/dL — AB (ref 8.9–10.3)
CO2: 20 mmol/L — AB (ref 22–32)
CREATININE: 0.68 mg/dL (ref 0.44–1.00)
Chloride: 110 mmol/L (ref 101–111)
GFR calc non Af Amer: >60 mL/min (ref >60)
GLUCOSE: 95 mg/dL (ref 65–99)
Potassium: 3.2 mmol/L — ABNORMAL LOW (ref 3.5–5.1)
SODIUM: 139 mmol/L (ref 135–145)
Total Protein: 6.2 g/dL — ABNORMAL LOW (ref 6.5–8.1)

## 2017-12-02 LAB — PHOSPHORUS: Phosphorus: 2.4 mg/dL — ABNORMAL LOW (ref 2.5–4.6)

## 2017-12-02 LAB — MAGNESIUM: Magnesium: 1.8 mg/dL (ref 1.7–2.4)

## 2017-12-02 LAB — TSH: TSH: 0.957 u[IU]/mL (ref 0.350–4.500)

## 2017-12-02 MED ORDER — METOPROLOL TARTRATE 5 MG/5ML IV SOLN
2.5000 mg | Freq: Three times a day (TID) | INTRAVENOUS | Status: DC
  Administered 2017-12-02 – 2017-12-05 (×9): 2.5 mg via INTRAVENOUS
  Filled 2017-12-02 (×9): qty 5

## 2017-12-02 MED ORDER — IOPAMIDOL (ISOVUE-300) INJECTION 61%
INTRAVENOUS | Status: AC
  Administered 2017-12-02: 300 mL via RECTAL
  Filled 2017-12-02: qty 300

## 2017-12-02 MED ORDER — POLYETHYLENE GLYCOL 3350 17 GM/SCOOP PO POWD: 18.0000 g | Freq: Two times a day (BID) | ORAL | Status: DC

## 2017-12-02 MED ORDER — POLYETHYLENE GLYCOL 3350 17 G PO PACK
17.0000 g | PACK | Freq: Two times a day (BID) | ORAL | Status: DC
  Administered 2017-12-02 – 2017-12-03 (×2): 17 g via ORAL
  Filled 2017-12-02 (×2): qty 1

## 2017-12-02 NOTE — Progress Notes (Signed)
Jahmiyah Dullea Fier 4:04 PM  Subjective: Patient doing better from Gastrografin enema and her case discussed with my partner Dr. B and her hospital computer chart reviewed as well as her CT and Gastrografin enema and her case discussed with her daughter-in-law as well  Objective: Vital signs stable afebrile no acute distress she looks better than one would think abdomen is soft nontender rare bowel sounds no guarding or rebound CT in Gastrografin enema reviewed potassium decreased at 3.2 CBC okay  Assessment: Metastatic colon cancer with partially obstructed stent  Plan: Will allow clear liquids and twice a day MiraLAX for now possibly increased MiraLAX tomorrow and on Thursday will proceed with a flexible sigmoidoscopy and eitherdilation or cleaning out or a second stent and we also discussed surgical options and the risks of the procedure was discussed  [facility] E  Pager [phone] After 5PM or if no answer call [phone]

## 2017-12-02 NOTE — Progress Notes (Signed)
Mary [name]   DOB:02/07/1933   UM#:[id number]   VQM#:[id number]  ONCOLOGY FOLLOW UP NOTE   Subjective: Patient is well-known to me, has been under my care for her metastatic colon cancer.  She was admitted yesterday for bowel obstruction.  She still has intermittent abdominal pain, on clear liquid now.   Objective:  Vitals:   12/02/17 1540 12/02/17 2207  BP: 131/79 130/70  Pulse: 74 83  Resp:  18  Temp: 98.1 F (36.7 C) 98.4 F (36.9 C)  SpO2: 94% 93%    Body mass index is 28.57 kg/m.  Intake/Output Summary (Last 24 hours) at 12/02/2017 2209 Last data filed at 12/02/2017 2131 Gross per 24 hour  Intake 1945 ml  Output -  Net 1945 ml     Sclerae unicteric  Oropharynx clear  No peripheral adenopathy  Lungs clear -- no rales or rhonchi  Heart regular rate and rhythm  Abdomen distended, (+) tenderness   MSK no focal spinal tenderness, no peripheral edema  Neuro nonfocal    CBG (last 3)  No results for input(s): GLUCAP in the last 72 hours.   Labs:  Lab Results  Component Value Date   WBC 5.9 12/02/2017   HGB 11.6 (L) 12/02/2017   HCT 36.0 12/02/2017   MCV 96.5 12/02/2017   PLT 386 12/02/2017   NEUTROABS 3.5 12/01/2017   CMP Latest Ref Rng & Units 12/02/2017 12/01/2017 11/28/2017  Glucose 65 - 99 mg/dL 95 124(H) 86  BUN 6 - 20 mg/dL 11 14 11   Creatinine 0.44 - 1.00 mg/dL 0.68 0.69 0.82  Sodium 135 - 145 mmol/L 139 137 138  Potassium 3.5 - 5.1 mmol/L 3.2(L) 2.9(L) 3.0(LL)  Chloride 101 - 111 mmol/L 110 102 105  CO2 22 - 32 mmol/L 20(L) 26 25  Calcium 8.9 - 10.3 mg/dL 8.3(L) 9.2 9.0  Total Protein 6.5 - 8.1 g/dL 6.2(L) 7.0 7.5  Total Bilirubin 0.3 - 1.2 mg/dL 0.6 0.7 0.9  Alkaline Phos 38 - 126 U/L 94 103 128  AST 15 - 41 U/L 25 24 51(H)  ALT 14 - 54 U/L 30 32 34    Urine Studies No results for input(s): UHGB, CRYS in the last 72 hours.  Invalid input(s): UACOL, UAPR, USPG, UPH, UTP, UGL, UKET, UBIL, UNIT, UROB, ULEU, UEPI, UWBC, URBC, UBAC, CAST, [place],  [place]  Basic Metabolic Panel: Recent Labs  Lab 11/28/17 0842 12/01/17 1349 12/02/17 0445  NA 138 137 139  K 3.0* 2.9* 3.2*  CL 105 102 110  CO2 25 26 20*  GLUCOSE 86 124* 95  BUN 11 14 11   CREATININE 0.82 0.69 0.68  CALCIUM 9.0 9.2 8.3*  MG 1.7 1.7 1.8  PHOS  --   --  2.4*   GFR Estimated Creatinine Clearance: 57.9 mL/min (by C-G formula based on SCr of 0.68 mg/dL). Liver Function Tests: Recent Labs  Lab 11/28/17 0842 12/01/17 1349 12/02/17 0445  AST 51* 24 25  ALT 34 32 30  ALKPHOS 128 103 94  BILITOT 0.9 0.7 0.6  PROT 7.5 7.0 6.2*  ALBUMIN 3.4* 3.3* 3.0*   Recent Labs  Lab 12/01/17 1349  LIPASE 31   No results for input(s): AMMONIA in the last 168 hours. Coagulation profile No results for input(s): INR, PROTIME in the last 168 hours.  CBC: Recent Labs  Lab 11/28/17 0842 12/01/17 1349 12/02/17 0445  WBC 8.9 6.6 5.9  NEUTROABS 5.1 3.5  --   HGB 11.6 12.4 11.6*  HCT 35.7 36.9 36.0  MCV 97.3 94.9 96.5  PLT 324 402* 386   Cardiac Enzymes: No results for input(s): CKTOTAL, CKMB, CKMBINDEX, TROPONINI in the last 168 hours. BNP: Invalid input(s): POCBNP CBG: No results for input(s): GLUCAP in the last 168 hours. D-Dimer No results for input(s): DDIMER in the last 72 hours. Hgb A1c No results for input(s): HGBA1C in the last 72 hours. Lipid Profile No results for input(s): CHOL, HDL, LDLCALC, TRIG, CHOLHDL, LDLDIRECT in the last 72 hours. Thyroid function studies Recent Labs    12/02/17 0445  TSH 0.957   Anemia work up No results for input(s): VITAMINB12, FOLATE, FERRITIN, TIBC, IRON, RETICCTPCT in the last 72 hours. Microbiology No results found for this or any previous visit (from the past 240 hour(s)).    Studies:  Ct Abdomen Pelvis W Contrast  Result Date: 12/01/2017 CLINICAL DATA:  Abdominal pain, nausea vomiting. No bowel movement for 1 week. EXAM: CT ABDOMEN AND PELVIS WITH CONTRAST TECHNIQUE: Multidetector CT imaging of the abdomen  and pelvis was performed using the standard protocol following bolus administration of intravenous contrast. CONTRAST:  133mL ISOVUE-300 IOPAMIDOL (ISOVUE-300) INJECTION 61% COMPARISON:  11/07/2017 FINDINGS: Lower chest: Known bilateral metastatic pulmonary nodules. The largest in the right lung base measuring 1.1 cm, stable. The largest in the left lung base measuring 1.4 cm, decreased from 2.2 cm on most recent body CT, image 26/63, sequence 4. Hepatobiliary: Known hepatic metastases, 1 in the dome of the liver and 1 in the lateral aspect of the left lobe of the liver are more indistinct and appears somewhat smaller. Cholelithiasis. No evidence of acute cholecystitis. Pancreas: Unremarkable. No pancreatic ductal dilatation or surrounding inflammatory changes. Spleen: Normal in size without focal abnormality. Adrenals/Urinary Tract: Normal appearance of the adrenal glands and right kidney. Ill-defined 2.2 x 2.4 cm mass in the left renal hilum, stable. No evidence of hydronephrosis. Stomach/Bowel: There is diffuse dilation of the colon which demonstrates featureless appearance and liquid contents to the level of the distal sigmoid colon stent. Vascular/Lymphatic: Aortic atherosclerosis. No enlarged abdominal or pelvic lymph nodes. Reproductive: Unchanged appearance of left adnexal mass. Normal right adnexa. Postcholecystectomy. Other: No abdominal wall hernia or abnormality. No abdominopelvic ascites. Musculoskeletal: No acute or significant osseous findings. Multilevel osteoarthritic changes. IMPRESSION: Probable obstruction at the level of the sigmoid colon stent with diffuse dilation of the upstream colon. The small bowel is fluid-filled but demonstrates normal caliber. Somewhat decreased in size known hepatic metastatic disease. Stable in size known left renal mass. Stable left adnexal mass. Decreased in size left lung base pulmonary metastatic nodule. Electronically Signed   By: Fidela Salisbury M.D.   On:  12/01/2017 17:32   Dg Colon W/water Raelene Bott Cm  Result Date: 12/02/2017 CLINICAL DATA:  84 year old female with colon cancer post sigmoid colon stent placement. Question obstruction? Subsequent encounter. EXAM: COLON WITH WATER SOLUTION CONTRAST COMPARISON:  12/01/2017 CT. Fluoroscopic time: 1 minutes and 24 seconds. 45.4 mGy. FINDINGS: Diluted water-soluble contrast was slowly instilled. Irregular narrowing of contrast as it traverses through the sigmoid colon stent. Colon proximal to the stent is dilated, predominantly fluid-filled and containing stool. Once contrast was placed into remainder of sigmoid colon patient was slightly uncomfortable and exam terminated and contrast drain. IMPRESSION: Irregular narrowing of contrast as it traverses through the sigmoid colon stent suggestive of tumor in this region. Although this does not cause complete obstruction, this contributes to partial obstruction as the colon proximal to the stent is dilated and predominantly fluid-filled. Complete colon was not imaged as  patient was uncomfortable after contrast traversed beyond the stent and study was terminated at this point. Electronically Signed   By: Genia Del M.D.   On: 12/02/2017 13:28    Assessment: 84 y.o. female with past medical history of metastatic sigmoid colon cancer with chronic sigmoid colon obstructive dur to the tumor, s/p stent placement, admitted for recurrent partial obstruction   1.sigmoid colon obstruction secondary to tumor  2. Metastatic sigmoid colon cancer to liver and lungs, on palliative chemo Xeloda and Vectibix 3. Abdominal pain secondary to #1 4. Mild anemia  5. HTN 6. AF   Plan:  -she has been evaluated by GI Dr. Watt Climes, and plan to repeat sigmoidoscopy and possible dilatation or stent exchange with tumor cleaning  -If endoscopy procedure is not able to resolve the obstruction, then surgery with diverting colostomy may be the only option. Given her overall good metastatic cancer  control, otherwise not symptomatic from the metastatic disease, and decent quality life (except her symptoms related to the bowel obstruction), I encourage her to consider surgery if needed. She is open to that.  -I will certainly hold chemo during this process. If she recovers well from the obstruction, continuing chemo is an options.  -she understands her cancer is not curable, she values quality of life more, does not want to prolong her life if she has poor quality of life. If there is no good options to resolve her bowel obstruction, hospice then would be reasonable.  -Palliative care consult has been request, they will see her tomorrow -I will be out of office from Thursday to Sunday, return to office on Monday. Please call my in-call partner if needed. -I greatly appreciate the hospitalist, GI and palliative care teams's help to take care this nice lady.  Truitt Merle  12/02/2017    Truitt Merle, MD 12/02/2017  10:09 PM

## 2017-12-02 NOTE — Consult Note (Addendum)
Referring Provider:  [facility] Primary Care Physician:  Moshe Cipro, MD Primary Gastroenterologist:  unassigned  Reason for Consultation:  Metastatic colon cancer  HPI: Mary [name] is a 84 y.o. female with past medical history significant for metastatic adenocarcinoma of the colon diagnosed in January 2018.patient subsequently developed colonic stricture requiring chronic stent placement in December 2018.she has declined surgical intervention in the past.She was seen by oncology on 11/28/2017.there was plan for palliative chemotherapy. Patient started noticing worsening constipation with no bowel movement for last 1 week. She then developed worsening abdominal pain for last 2-3 days.complaining of nausea but denied any vomiting.denied any dysphagia or odynophagia. Denied any fever or chills.   CT scan yesterday showed probable obstruction at the level of the sigmoid colon stent with diffuse dilatation of the upstream colon.CT scan on 11/07/2017 showed no evidence of obstruction.  Past Medical History:  Diagnosis Date  . Aneurysm of ascending aorta (HCC) 12/03/2016   3.7 cm by echo 10/2016  . Colon cancer [facility])    dx via biospy from colonoscopy-- malignant ---  currently having work-up done w/ dr gross (general surgeon) and coordinate surgery w/ urologsit for renal tumor  . Essential hypertension 10/31/2016  . GERD (gastroesophageal reflux disease)   . Glaucoma   . Hiatal hernia   . HOH (hard of hearing)    bilateral even w/ hearing aids  . Hyperlipidemia   . Kidney tumor    left side  . Legally blind in right eye, as defined in [place]    due to macular degeneration  . Macular degeneration   . Nausea    intermittant due to colon mass  . Ovarian cyst   . Persistent atrial fibrillation ([place]) 10/31/2016   on ASA only  . PONV (postoperative nausea and vomiting)   . Wears glasses   . Wears hearing aid    bilateral    Past Surgical History:  Procedure Laterality Date  . BREAST CYST  EXCISION  1990   benign  . COLONOSCOPY  09/04/2016  . CYSTOSCOPY WITH RETROGRADE PYELOGRAM, URETEROSCOPY AND STENT PLACEMENT Left 10/10/2016   Procedure: CYSTOSCOPY WITH RETROGRADE PYELOGRAM, DIAGNOSTIC URETEROSCOPY  AND STENT PLACEMENT;  Surgeon: Alexis Frock, MD;  Location: [facility];  Service: Urology;  Laterality: Left;  . ESOPHAGOGASTRODUODENOSCOPY  11/07/2014  . FLEXIBLE SIGMOIDOSCOPY N/A 08/11/2017   Procedure: FLEXIBLE SIGMOIDOSCOPY;  Surgeon: Clarene Essex, MD;  Location: [facility] ENDOSCOPY;  Service: Endoscopy;  Laterality: N/A;  With Fluoroscopy for colonic stent placement for colonic stricture  . NASAL SINUS SURGERY    . TONSILLECTOMY      Prior to Admission medications   Medication Sig Start Date End Date Taking? Authorizing Provider  amLODipine (NORVASC) 5 MG tablet Take 1 tablet (5 mg total) by mouth daily. 03/04/17  Yes Skeet Latch, MD  Bisacodyl (DULCOLAX PO) Take 2 tablets by mouth 2 (two) times daily.   Yes [provider]  bisacodyl (DULCOLAX) 10 MG suppository Place 10 mg rectally as needed for moderate constipation.   Yes [provider]  capecitabine (XELODA) 500 MG tablet Take 4 tablets ( 2000 mg ) in  AM ;  3 tablets ( 1500 mg ) in  PM for  7 days on ,  Off 7 days. 11/24/17  Yes Truitt Merle, MD  diltiazem (CARDIZEM CD) 240 MG 24 hr capsule Take 1 capsule (240 mg total) by mouth daily. 05/28/17  Yes Skeet Latch, MD  docusate sodium (COLACE) 100 MG capsule Take 1 capsule (100  mg total) by mouth 2 (two) times daily. 10/31/17  Yes Truitt Merle, MD  escitalopram (LEXAPRO) 10 MG tablet Take 10 mg by mouth daily.   Yes [provider]  fexofenadine (ALLEGRA) 180 MG tablet Take 180 mg by mouth daily.   Yes [provider]  furosemide (LASIX) 40 MG tablet Take 1 tablet (40 mg total) by mouth daily. 09/29/17  Yes Skeet Latch, MD  latanoprost (XALATAN) 0.005 % ophthalmic solution Place 1 drop into both eyes at bedtime.   Yes  [provider]  losartan (COZAAR) 100 MG tablet Take 1 tablet (100 mg total) by mouth daily. 09/29/17  Yes Skeet Latch, MD  Multiple Vitamins-Minerals (VITEYES AREDS ADVANCED) CAPS Take 1 capsule by mouth daily.   Yes [provider]  omeprazole (PRILOSEC) 40 MG capsule Take 40 mg by mouth daily.   Yes [provider]  ondansetron (ZOFRAN ODT) 4 MG disintegrating tablet Take 1 tablet (4 mg total) by mouth every 8 (eight) hours as needed for nausea or vomiting. 07/09/17  Yes Truitt Merle, MD  oxyCODONE (OXY IR/ROXICODONE) 5 MG immediate release tablet Take 1 tablet (5 mg total) by mouth every 6 (six) hours as needed for moderate pain or severe pain. 11/13/17  Yes Truitt Merle, MD  Polyethyl Glycol-Propyl Glycol (SYSTANE) 0.4-0.3 % SOLN Apply 1 drop to eye daily as needed (dry eyes).    Yes [provider]  polyethylene glycol (MIRALAX / GLYCOLAX) packet Take 17 g by mouth daily as needed for moderate constipation. 08/13/17  Yes Bonnielee Haff, MD  Potassium Chloride ER 20 MEQ TBCR Take 20 mEq by mouth daily. 09/04/17  Yes Truitt Merle, MD  sennosides-docusate sodium (SENOKOT-S) 8.6-50 MG tablet Take 1-2 tablets by mouth 3 (three) times daily.   Yes [provider]  urea (CARMOL) 10 % cream Apply topically as needed. 10/31/17  Yes Truitt Merle, MD  clindamycin (CLINDAGEL) 1 % gel Apply topically 2 (two) times daily. Patient not taking: Reported on 12/01/2017 07/24/17   Truitt Merle, MD  lactulose (CEPHULAC) 20 g packet Take 1 packet (20 g total) by mouth every 4 (four) hours as needed (severe constipation). Patient not taking: Reported on 12/01/2017 11/28/17   Truitt Merle, MD  metoprolol tartrate (LOPRESSOR) 25 MG tablet 1/2 TABLET BY MOUTH AS NEEDED FOR PALPITATIONS Patient not taking: Reported on 12/01/2017 07/08/17   Skeet Latch, MD    Scheduled Meds: . amLODipine  5 mg Oral Daily  . diltiazem  240 mg Oral Daily  . enoxaparin (LOVENOX) injection  40 mg  Subcutaneous QHS  . latanoprost  1 drop Both Eyes QHS  . thiamine  100 mg Intravenous Daily   Continuous Infusions: PRN Meds:.acetaminophen **OR** acetaminophen, HYDROmorphone (DILAUDID) injection, metoCLOPramide (REGLAN) injection  Allergies as of 12/01/2017 - Review Complete 12/01/2017  Allergen Reaction Noted  . Benazepril  01/08/2017  . Tylenol [acetaminophen]  10/16/2017  . Bactrim [sulfamethoxazole-trimethoprim] Nausea And Vomiting 10/03/2016    Family History  Problem Relation Age of Onset  . Cancer Sister 28       breast cancer   . Cancer Brother 57       lung cancer   . Diabetes Son   . High blood pressure Son   . Peripheral Artery Disease Mother   . High blood pressure Mother     Social History   Socioeconomic History  . Marital status: Widowed    Spouse name: Not on file  . Number of children: Not on file  .  Years of education: Not on file  . Highest education level: Not on file  Occupational History  . Not on file  Social Needs  . Financial resource strain: Not on file  . Food insecurity:    Worry: Not on file    Inability: Not on file  . Transportation needs:    Medical: Not on file    Non-medical: Not on file  Tobacco Use  . Smoking status: Never Smoker  . Smokeless tobacco: Never Used  Substance and Sexual Activity  . Alcohol use: No  . Drug use: No  . Sexual activity: Not on file  Lifestyle  . Physical activity:    Days per week: Not on file    Minutes per session: Not on file  . Stress: Not on file  Relationships  . Social connections:    Talks on phone: Not on file    Gets together: Not on file    Attends religious service: Not on file    Active member of club or organization: Not on file    Attends meetings of clubs or organizations: Not on file    Relationship status: Not on file  . Intimate partner violence:    Fear of current or ex partner: Not on file    Emotionally abused: Not on file    Physically abused: Not on file     Forced sexual activity: Not on file  Other Topics Concern  . Not on file  Social History Narrative  . Not on file    Review of Systems: Review of Systems  Constitutional: Positive for malaise/fatigue. Negative for chills and fever.  HENT: Positive for hearing loss. Negative for tinnitus.   Eyes: Negative for blurred vision and double vision.  Respiratory: Negative for cough and hemoptysis.   Cardiovascular: Negative for chest pain and palpitations.  Gastrointestinal: Positive for abdominal pain, constipation, heartburn and nausea. Negative for blood in stool, diarrhea, melena and vomiting.  Genitourinary: Negative for dysuria and urgency.  Musculoskeletal: Positive for back pain and myalgias.  Skin: Negative for itching and rash.  Neurological: Negative for seizures and loss of consciousness.  Endo/Heme/Allergies: Does not bruise/bleed easily.  Psychiatric/Behavioral: Negative for hallucinations and suicidal ideas.   Physical Exam: Vital signs: Vitals:   12/01/17 2144 12/02/17 0555  BP: (!) 150/79 110/62  Pulse: 88 95  Resp: 20 18  Temp: 98 F (36.7 C) 98.6 F (37 C)  SpO2: 93% 97%   Last BM Date: 11/24/17 Physical Exam  Constitutional: She is oriented to person, place, and time. She appears well-developed and well-nourished. No distress.  HENT:  Head: Normocephalic and atraumatic.  Mouth/Throat: Oropharynx is clear and moist. No oropharyngeal exudate.  Eyes: EOM are normal. No scleral icterus.  Neck: Normal range of motion. Neck supple.  Cardiovascular: Normal rate, regular rhythm and normal heart sounds.  Pulmonary/Chest: Effort normal and breath sounds normal.  Abdominal: She exhibits distension. There is no tenderness. There is no rebound and no guarding.  Distended abdomen with high-pitched bowel sounds.  Musculoskeletal: Normal range of motion. She exhibits no edema.  Neurological: She is alert and oriented to person, place, and time.  Skin: Skin is warm. No  erythema.  Psychiatric: She has a normal mood and affect. Judgment normal.  Vitals reviewed.   GI:  Lab Results: Recent Labs    12/01/17 1349 12/02/17 0445  WBC 6.6 5.9  HGB 12.4 11.6*  HCT 36.9 36.0  PLT 402* 386   BMET Recent Labs  12/01/17 1349 12/02/17 0445  NA 137 139  K 2.9* 3.2*  CL 102 110  CO2 26 20*  GLUCOSE 124* 95  BUN 14 11  CREATININE 0.69 0.68  CALCIUM 9.2 8.3*   LFT Recent Labs    12/02/17 0445  PROT 6.2*  ALBUMIN 3.0*  AST 25  ALT 30  ALKPHOS 94  BILITOT 0.6   PT/INR No results for input(s): LABPROT, INR in the last 72 hours.   Studies/Results: Ct Abdomen Pelvis W Contrast  Result Date: 12/01/2017 CLINICAL DATA:  Abdominal pain, nausea vomiting. No bowel movement for 1 week. EXAM: CT ABDOMEN AND PELVIS WITH CONTRAST TECHNIQUE: Multidetector CT imaging of the abdomen and pelvis was performed using the standard protocol following bolus administration of intravenous contrast. CONTRAST:  167mL ISOVUE-300 IOPAMIDOL (ISOVUE-300) INJECTION 61% COMPARISON:  11/07/2017 FINDINGS: Lower chest: Known bilateral metastatic pulmonary nodules. The largest in the right lung base measuring 1.1 cm, stable. The largest in the left lung base measuring 1.4 cm, decreased from 2.2 cm on most recent body CT, image 26/63, sequence 4. Hepatobiliary: Known hepatic metastases, 1 in the dome of the liver and 1 in the lateral aspect of the left lobe of the liver are more indistinct and appears somewhat smaller. Cholelithiasis. No evidence of acute cholecystitis. Pancreas: Unremarkable. No pancreatic ductal dilatation or surrounding inflammatory changes. Spleen: Normal in size without focal abnormality. Adrenals/Urinary Tract: Normal appearance of the adrenal glands and right kidney. Ill-defined 2.2 x 2.4 cm mass in the left renal hilum, stable. No evidence of hydronephrosis. Stomach/Bowel: There is diffuse dilation of the colon which demonstrates featureless appearance and liquid  contents to the level of the distal sigmoid colon stent. Vascular/Lymphatic: Aortic atherosclerosis. No enlarged abdominal or pelvic lymph nodes. Reproductive: Unchanged appearance of left adnexal mass. Normal right adnexa. Postcholecystectomy. Other: No abdominal wall hernia or abnormality. No abdominopelvic ascites. Musculoskeletal: No acute or significant osseous findings. Multilevel osteoarthritic changes. IMPRESSION: Probable obstruction at the level of the sigmoid colon stent with diffuse dilation of the upstream colon. The small bowel is fluid-filled but demonstrates normal caliber. Somewhat decreased in size known hepatic metastatic disease. Stable in size known left renal mass. Stable left adnexal mass. Decreased in size left lung base pulmonary metastatic nodule. Electronically Signed   By: Fidela Salisbury M.D.   On: 12/01/2017 17:32    Impression/Plan: - metastatic colon cancer with history of stricture involving rectosigmoid Junction status post stent placement in December 2018. Patient with worsening constipation for last 1 week. Repeat CT scan showed probable obstruction at the level of previously placed Stent with diffuse dilatation of the proximal colon - Abdominal pain. Most likely from colon obstruction  Recommendations ------------------------ - Patient continues to decline surgical intervention. She will probably need repeat flexible sigmoidoscopy for further evaluation as well as for stent exchange if clinically indicated.  D/W Dr. Watt Climes, he recommended  Barium Enema  for further evaluation. Discussed with family over the phone. They are in agreement. - Keep patient nothing by mouth for now. GI will follow.    LOS: 1 day   Otis Brace  MD, FACP 12/02/2017, 8:39 AM  Contact #  [phone]

## 2017-12-02 NOTE — Plan of Care (Signed)

## 2017-12-02 NOTE — Progress Notes (Addendum)
[facility]  PROGRESS NOTE  Mary [name] YHC:[id number] DOB: 02/07/1933 DOA: 12/01/2017 PCP: Moshe Cipro, MD   Brief HPI:    84 y.o. female with medical history significant of Metastatic sigmoid colon Adenocarcinoma with colonic strictures status post stenting by [facility], metastases to the liver, metastasis to the lungs, left renal and left adnexal masses left hydronephrosis, history of paroxysmal atrial fibrillation, HTN, HLD  Presented with 1 week of constipation unable to pass any stool now unable to pass any flatus either she has tried to use MiraLAX and doubled her dose but no relief.   Was diagnosed with a likely colonic stent failure and  hypokalemia which was replaced      Subjective   Patient seen and examined, denies abdominal pain.  Not able to pass gas at this time.   Assessment/Plan:     1. Colonic obstruction-CT scan shows obstruction at the level of the stent in the sigmoid colon.  Gastroneurology has been consulted.  Plan for supportive repeat in a.m. replace the stent 2. Cancer sigmoid colon-followed by oncology outpatient 3. Essential hypertension-continue diltiazem, Norvasc 4. Hypokalemia-potassium is 3.2, will replace potassium and check BMP in a.m. 5. Hyperlipidemia-stable, statins on hold. 6. Persistent atrial fibrillation-  CHA2DS2VASc  Score is 3, not on anticoagulation due to risk of bleeding.  Heart rate controlled with Cardizem.    DVT prophylaxis: SCDs  Code Status: Full code  Family Communication: No family at bedside  Disposition Plan: likely home in 2-3 days   Consultants:  Gastroenterology  Procedures:  None    Antibiotics:   Anti-infectives (From admission, onward)   None       Objective   Vitals:   12/02/17 0555 12/02/17 0838 12/02/17 0845 12/02/17 1540  BP: 110/62 132/77 132/77 131/79  Pulse: 95  92 74  Resp: 18     Temp: 98.6 F (37 C)   98.1 F (36.7 C)  TempSrc:    Oral  SpO2: 97%  91% 94%   Weight:      Height:        Intake/Output Summary (Last 24 hours) at 12/02/2017 1652 Last data filed at 12/02/2017 1500 Gross per 24 hour  Intake 2824.99 ml  Output -  Net 2824.99 ml   Filed Weights   12/01/17 2025  Weight: 80.3 kg (177 lb)     Physical Examination:    General: Appears in no acute distress  Cardiovascular: S1-S2, regular  Respiratory: Clear to auscultation bilaterally  Abdomen: Soft, nontender, mild distention, sluggish bowel sounds throughout  Extremities: No edema noted in the lower extremities  Neurologic: Alert, oriented x3, no focal deficit.     Data Reviewed: I have personally reviewed following labs and imaging studies  CBG: No results for input(s): GLUCAP in the last 168 hours.  CBC: Recent Labs  Lab 11/28/17 0842 12/01/17 1349 12/02/17 0445  WBC 8.9 6.6 5.9  NEUTROABS 5.1 3.5  --   HGB 11.6 12.4 11.6*  HCT 35.7 36.9 36.0  MCV 97.3 94.9 96.5  PLT 324 402* 517    Basic Metabolic Panel: Recent Labs  Lab 11/28/17 0842 12/01/17 1349 12/02/17 0445  NA 138 137 139  K 3.0* 2.9* 3.2*  CL 105 102 110  CO2 25 26 20*  GLUCOSE 86 124* 95  BUN 11 14 11   CREATININE 0.82 0.69 0.68  CALCIUM 9.0 9.2 8.3*  MG 1.7 1.7 1.8  PHOS  --   --  2.4*    No results found  for this or any previous visit (from the past 240 hour(s)).   Liver Function Tests: Recent Labs  Lab 11/28/17 0842 12/01/17 1349 12/02/17 0445  AST 51* 24 25  ALT 34 32 30  ALKPHOS 128 103 94  BILITOT 0.9 0.7 0.6  PROT 7.5 7.0 6.2*  ALBUMIN 3.4* 3.3* 3.0*   Recent Labs  Lab 12/01/17 1349  LIPASE 31   No results for input(s): AMMONIA in the last 168 hours.  Cardiac Enzymes: No results for input(s): CKTOTAL, CKMB, CKMBINDEX, TROPONINI in the last 168 hours. BNP (last 3 results) No results for input(s): BNP in the last 8760 hours.  ProBNP (last 3 results) No results for input(s): PROBNP in the last 8760 hours.    Studies: Ct Abdomen Pelvis W  Contrast  Result Date: 12/01/2017 CLINICAL DATA:  Abdominal pain, nausea vomiting. No bowel movement for 1 week. EXAM: CT ABDOMEN AND PELVIS WITH CONTRAST TECHNIQUE: Multidetector CT imaging of the abdomen and pelvis was performed using the standard protocol following bolus administration of intravenous contrast. CONTRAST:  18mL ISOVUE-300 IOPAMIDOL (ISOVUE-300) INJECTION 61% COMPARISON:  11/07/2017 FINDINGS: Lower chest: Known bilateral metastatic pulmonary nodules. The largest in the right lung base measuring 1.1 cm, stable. The largest in the left lung base measuring 1.4 cm, decreased from 2.2 cm on most recent body CT, image 26/63, sequence 4. Hepatobiliary: Known hepatic metastases, 1 in the dome of the liver and 1 in the lateral aspect of the left lobe of the liver are more indistinct and appears somewhat smaller. Cholelithiasis. No evidence of acute cholecystitis. Pancreas: Unremarkable. No pancreatic ductal dilatation or surrounding inflammatory changes. Spleen: Normal in size without focal abnormality. Adrenals/Urinary Tract: Normal appearance of the adrenal glands and right kidney. Ill-defined 2.2 x 2.4 cm mass in the left renal hilum, stable. No evidence of hydronephrosis. Stomach/Bowel: There is diffuse dilation of the colon which demonstrates featureless appearance and liquid contents to the level of the distal sigmoid colon stent. Vascular/Lymphatic: Aortic atherosclerosis. No enlarged abdominal or pelvic lymph nodes. Reproductive: Unchanged appearance of left adnexal mass. Normal right adnexa. Postcholecystectomy. Other: No abdominal wall hernia or abnormality. No abdominopelvic ascites. Musculoskeletal: No acute or significant osseous findings. Multilevel osteoarthritic changes. IMPRESSION: Probable obstruction at the level of the sigmoid colon stent with diffuse dilation of the upstream colon. The small bowel is fluid-filled but demonstrates normal caliber. Somewhat decreased in size known  hepatic metastatic disease. Stable in size known left renal mass. Stable left adnexal mass. Decreased in size left lung base pulmonary metastatic nodule. Electronically Signed   By: Fidela Salisbury M.D.   On: 12/01/2017 17:32   Dg Colon W/water Raelene Bott Cm  Result Date: 12/02/2017 CLINICAL DATA:  84 year old female with colon cancer post sigmoid colon stent placement. Question obstruction? Subsequent encounter. EXAM: COLON WITH WATER SOLUTION CONTRAST COMPARISON:  12/01/2017 CT. Fluoroscopic time: 1 minutes and 24 seconds. 45.4 mGy. FINDINGS: Diluted water-soluble contrast was slowly instilled. Irregular narrowing of contrast as it traverses through the sigmoid colon stent. Colon proximal to the stent is dilated, predominantly fluid-filled and containing stool. Once contrast was placed into remainder of sigmoid colon patient was slightly uncomfortable and exam terminated and contrast drain. IMPRESSION: Irregular narrowing of contrast as it traverses through the sigmoid colon stent suggestive of tumor in this region. Although this does not cause complete obstruction, this contributes to partial obstruction as the colon proximal to the stent is dilated and predominantly fluid-filled. Complete colon was not imaged as patient was uncomfortable after contrast traversed  beyond the stent and study was terminated at this point. Electronically Signed   By: Genia Del M.D.   On: 12/02/2017 13:28    Scheduled Meds: . enoxaparin (LOVENOX) injection  40 mg Subcutaneous QHS  . latanoprost  1 drop Both Eyes QHS  . metoprolol tartrate  2.5 mg Intravenous Q8H  . polyethylene glycol  17 g Oral BID  . thiamine  100 mg Intravenous Daily      Time spent: 20 min  [facility] Pager [phone]. If 7PM-7AM, please contact night-coverage at [email or web address], Office  [phone]  password [email or web address]  12/02/2017, 4:52 PM  LOS: 1 day

## 2017-12-02 NOTE — Plan of Care (Signed)

## 2017-12-02 NOTE — Progress Notes (Signed)
Paged doctor as fluid order has expired. Awaiting response to see if fluids will need to be D/C or continued.

## 2017-12-02 NOTE — Care Management Note (Signed)
Case Management Note  Patient Details  Name: Mary [name] MRN: [id number] Date of Birth: 02/07/1933  Subjective/Objective: 84 y/o f admitted w/Met Colon Ca. From home. CT-probable bowel obstruction. GI folowing-flex sig,baruim enema.                    Action/Plan:d/c plan home.   Expected Discharge Date:  (unknown)               Expected Discharge Plan:  Home/Self Care  In-House Referral:     Discharge planning Services  CM Consult  Post Acute Care Choice:    Choice offered to:     DME Arranged:    DME Agency:     HH Arranged:    HH Agency:     Status of Service:  In process, will continue to follow  If discussed at Long Length of Stay Meetings, dates discussed:    Additional Comments:  Dessa Phi, RN 12/02/2017, 2:31 PM

## 2017-12-02 NOTE — Progress Notes (Signed)
PMT consult received. Chart reviewed. PMT NP will see 12/03/17. Thank you.   NO CHARGE  Ihor Dow, FNP-C Palliative Medicine Team  Phone: [phone] Fax: [phone]

## 2017-12-03 DIAGNOSIS — Z515 Encounter for palliative care: Secondary | ICD-10-CM

## 2017-12-03 DIAGNOSIS — E785 Hyperlipidemia, unspecified: Secondary | ICD-10-CM

## 2017-12-03 DIAGNOSIS — R1084 Generalized abdominal pain: Secondary | ICD-10-CM

## 2017-12-03 DIAGNOSIS — K56699 Other intestinal obstruction unspecified as to partial versus complete obstruction: Secondary | ICD-10-CM

## 2017-12-03 DIAGNOSIS — Z7189 Other specified counseling: Secondary | ICD-10-CM

## 2017-12-03 DIAGNOSIS — I481 Persistent atrial fibrillation: Secondary | ICD-10-CM

## 2017-12-03 LAB — BASIC METABOLIC PANEL
Anion gap: 9 (ref 5–15)
BUN: 12 mg/dL (ref 6–20)
CHLORIDE: 106 mmol/L (ref 101–111)
CO2: 24 mmol/L (ref 22–32)
CREATININE: 0.63 mg/dL (ref 0.44–1.00)
Calcium: 9.1 mg/dL (ref 8.9–10.3)
GFR calc Af Amer: >60 mL/min (ref >60)
GFR calc non Af Amer: >60 mL/min (ref >60)
Glucose, Bld: 100 mg/dL — ABNORMAL HIGH (ref 65–99)
POTASSIUM: 3.3 mmol/L — AB (ref 3.5–5.1)
Sodium: 139 mmol/L (ref 135–145)

## 2017-12-03 LAB — CBC
HEMATOCRIT: 38.3 % (ref 36.0–46.0)
Hemoglobin: 12.2 g/dL (ref 12.0–15.0)
MCH: 30.8 pg (ref 26.0–34.0)
MCHC: 31.9 g/dL (ref 30.0–36.0)
MCV: 96.7 fL (ref 78.0–100.0)
PLATELETS: 430 10*3/uL — AB (ref 150–400)
RBC: 3.96 MIL/uL (ref 3.87–5.11)
RDW: 16.7 % — AB (ref 11.5–15.5)
WBC: 5.7 10*3/uL (ref 4.0–10.5)

## 2017-12-03 MED ORDER — POTASSIUM CHLORIDE 10 MEQ/100ML IV SOLN
10.0000 meq | INTRAVENOUS | Status: AC
  Administered 2017-12-03 (×3): 10 meq via INTRAVENOUS
  Filled 2017-12-03 (×2): qty 100

## 2017-12-03 MED ORDER — POLYETHYLENE GLYCOL 3350 17 G PO PACK
17.0000 g | PACK | Freq: Three times a day (TID) | ORAL | Status: DC
  Administered 2017-12-04 (×2): 17 g via ORAL
  Filled 2017-12-03 (×4): qty 1

## 2017-12-03 MED ORDER — POTASSIUM CHLORIDE 10 MEQ/100ML IV SOLN
INTRAVENOUS | Status: AC
  Administered 2017-12-03: 10 meq via INTRAVENOUS
  Filled 2017-12-03: qty 100

## 2017-12-03 MED ORDER — FLEET ENEMA 7-19 GM/118ML RE ENEM: 1.0000 | ENEMA | Freq: Once | RECTAL | Status: DC

## 2017-12-03 NOTE — Progress Notes (Signed)
PROGRESS NOTE    JORRYN HERSHBERGER  ZTI:[id number] DOB: 02/07/1933 DOA: 12/01/2017 PCP: Moshe Cipro, MD   Brief Narrative:  The patient is an 84 y.o.femalewith medical history significant of Metastatic sigmoid colon Adenocarcinomawith colonic strictures status post stenting by [facility],metastases to the liver, metastasis to the lungs, left renal and left adnexal masses left hydronephrosis, history of paroxysmal atrial fibrillation, HTN, HLD and other comorbids who presented with1 week of constipation unable to pass any stool now unable to pass any flatus either she has tried to use MiraLAX and doubled her dose but no relief. Was diagnosed with a likely colonic stent failureandhypokalemia which was replace. GI consulted and planning on doing Flex Sigmoidoscopy to evaluate the Stent and possibly exchange. Patient to have Sigmoidoscopy done in AM.   Assessment & Plan:   Active Problems:   Cancer of sigmoid colon ([place])   Persistent atrial fibrillation (H[facility])   Essential hypertension   Hyperlipidemia   Bowel obstruction ([facility])   Colon stricture ([facility])   Colon obstruction (H[facility])   Generalized abdominal pain   Palliative care by specialist  Colonic obstruction likely 2/2 due to Tumor -CT scan shows obstruction at the level of the stent in the sigmoid colon.  -Gastroenterology has been consulted and feel that her abdominal pain and distention is secondary to the colonic obstruction -Patient has history of a stricture involving the rectosigmoid junction which is status post stent placement in December 2018 -Barium enema yesterday showed partial obstruction of the level of the previously placed stent -To undergo flexible sigmoidoscopy by Dr. Jinny Blossom tomorrow and will have her MiraLAX increased to 3 times a day as well as a Fleet enema this AM -Per GI patient is to be nothing by mouth after midnight as she was on a clear liquid diet today -C/w clopamide 5 mg IV every 6 as needed for  nausea -Pain control with hydromorphone 0.5-1 mg every 4 hours as needed for severe pain  Metastatic Sigmoid Colon Cancer -Followed by Oncology Dr. Burr Medico as an outpatient -Has metastasis to the liver and lungs -On palliative chemotherapy with Xeloda and Vectibix -Chemotherapy being held currently and per oncology if she recovers well from obstruction will continue chemo as an option -Bowel obstruction is not able to be resolved and hospice would be reasonable and palliative has been consulted for evaluation -Palliative medicine team following and patient awaiting the sigmoidoscopy in AM; per their conversation with the patient continue full scope of treatment patient is a DNR at this time -CEA was 3.06 on 4/19  Essential Hypertension -Antihypertensives with Cardizem, Amlodipine, and Losartan are being held currently -Blood pressure is currently at goal -If worsens we will add IV hydralazine  Hypokalemia -Patient's potassium level this morning was 3.3 -Replete with IV KCl 30 mEq -Continue to monitor and replete as necessary -Repeat CMP in a.m.  Hyperlipidemia -Stable. -Upon further review patient is not on a statin at home  Persistent Atrial Fibrillation -CHA2DS2VASc Score is 3 but not on anticoagulation due to risk of bleeding.   -Heart rate controlled with Cardizem at home but since NPO is getting 2.5 mg of IV metoprolol tartrate q8h -Continue with telemetry monitoring  Thrombocytosis -Patient's platelet count went from 386 -> 430 -Continue to monitor and repeat CBC in a.m.  Hypophosphatemia -Patient's Phos level was 2.4 yesterday -Continue to monitor and repeat Phos Level in AM and replete if still low  Depression -Patient's escitalopram 10 mg p.o. daily is currently being held   Seasonal Allergies/Allergic  Rhinitis -Currently her fexofenadine is being held  GERD -Currently her Omeprazole 40 mg p.o. daily is being held  Glaucoma -Continue with Latanoprost 0.005%  ophthalmic solution 1 drop in both eyes at bedtime  DVT prophylaxis: Enoxaparin 40 mg subcu daily at bedtime Code Status: DO NOT RESUSCITATE Family Communication: No Family present at bedside  Disposition Plan: Remain Inpatient for continued workup and Sigmoidoscopy in AM   Consultants:   Gastroenterology [facility])  Oncology  Palliative Care Medicine   Procedures:   Antimicrobials:  Anti-infectives (From admission, onward)   None     Subjective: Patient was seen and examined at bedside and denies any nausea or vomiting however did have some abdominal pain and discomfort.  No chest pain or shortness of breath at this time.  Still has not had a bowel movement today. No other concerns or complaints at this time  Objective: Vitals:   12/02/17 1540 12/02/17 2207 12/03/17 0517 12/03/17 1506  BP: 131/79 130/70 (!) 152/88 131/84  Pulse: 74 83 (!) 120 83  Resp:  18 20 18   Temp: 98.1 F (36.7 C) 98.4 F (36.9 C) 98.8 F (37.1 C) 98.2 F (36.8 C)  TempSrc: Oral Oral  Oral  SpO2: 94% 93% 93% 92%  Weight:      Height:        Intake/Output Summary (Last 24 hours) at 12/03/2017 1616 Last data filed at 12/03/2017 1500 Gross per 24 hour  Intake 1710 ml  Output -  Net 1710 ml   Filed Weights   12/01/17 2025  Weight: 80.3 kg (177 lb)   Examination: Physical Exam:  Constitutional: WN/WD Caucasian female in NAD and appears calm and comfortable Eyes: Lids and conjunctivae normal, sclerae anicteric  ENMT: External Ears, Nose appear normal. Grossly normal hearing. Mucous membranes are moist.   Neck: Appears normal, supple, no cervical masses, normal ROM, no appreciable thyromegaly; no JVD Respiratory: Diminished to auscultation bilaterally, no wheezing, rales, rhonchi or crackles. Normal respiratory effort and patient is not tachypenic. No accessory muscle use.  Cardiovascular: Irregularly Irregular, no murmurs / rubs / gallops. S1 and S2 auscultated. No extremity edema.  Abdomen:  Soft, Tender to palpate, Distended slightly. Bowel sounds positive and slightly hyperactive.  GU: Deferred. Musculoskeletal: No clubbing / cyanosis of digits/nails. No joint deformity upper and lower extremities.  Skin: No rashes, lesions, ulcers on a limited skin evaluation. No induration; Warm and dry.  Neurologic: CN 2-12 grossly intact with no focal deficits. Romberg sign and cerebellar reflexes not assessed.  Psychiatric: Normal judgment and insight. Alert and oriented x 3. Normal mood and appropriate affect.   Data Reviewed: I have personally reviewed following labs and imaging studies  CBC: Recent Labs  Lab 11/28/17 0842 12/01/17 1349 12/02/17 0445 12/03/17 0525  WBC 8.9 6.6 5.9 5.7  NEUTROABS 5.1 3.5  --   --   HGB 11.6 12.4 11.6* 12.2  HCT 35.7 36.9 36.0 38.3  MCV 97.3 94.9 96.5 96.7  PLT 324 402* 386 657*   Basic Metabolic Panel: Recent Labs  Lab 11/28/17 0842 12/01/17 1349 12/02/17 0445 12/03/17 0525  NA 138 137 139 139  K 3.0* 2.9* 3.2* 3.3*  CL 105 102 110 106  CO2 25 26 20* 24  GLUCOSE 86 124* 95 100*  BUN 11 14 11 12   CREATININE 0.82 0.69 0.68 0.63  CALCIUM 9.0 9.2 8.3* 9.1  MG 1.7 1.7 1.8  --   PHOS  --   --  2.4*  --  GFR: Estimated Creatinine Clearance: 57.9 mL/min (by C-G formula based on SCr of 0.63 mg/dL). Liver Function Tests: Recent Labs  Lab 11/28/17 0842 12/01/17 1349 12/02/17 0445  AST 51* 24 25  ALT 34 32 30  ALKPHOS 128 103 94  BILITOT 0.9 0.7 0.6  PROT 7.5 7.0 6.2*  ALBUMIN 3.4* 3.3* 3.0*   Recent Labs  Lab 12/01/17 1349  LIPASE 31   No results for input(s): AMMONIA in the last 168 hours. Coagulation Profile: No results for input(s): INR, PROTIME in the last 168 hours. Cardiac Enzymes: No results for input(s): CKTOTAL, CKMB, CKMBINDEX, TROPONINI in the last 168 hours. BNP (last 3 results) No results for input(s): PROBNP in the last 8760 hours. HbA1C: No results for input(s): HGBA1C in the last 72 hours. CBG: No  results for input(s): GLUCAP in the last 168 hours. Lipid Profile: No results for input(s): CHOL, HDL, LDLCALC, TRIG, CHOLHDL, LDLDIRECT in the last 72 hours. Thyroid Function Tests: Recent Labs    12/02/17 0445  TSH 0.957   Anemia Panel: No results for input(s): VITAMINB12, FOLATE, FERRITIN, TIBC, IRON, RETICCTPCT in the last 72 hours. Sepsis Labs: No results for input(s): PROCALCITON, LATICACIDVEN in the last 168 hours.  No results found for this or any previous visit (from the past 240 hour(s)).   Radiology Studies: Ct Abdomen Pelvis W Contrast  Result Date: 12/01/2017 CLINICAL DATA:  Abdominal pain, nausea vomiting. No bowel movement for 1 week. EXAM: CT ABDOMEN AND PELVIS WITH CONTRAST TECHNIQUE: Multidetector CT imaging of the abdomen and pelvis was performed using the standard protocol following bolus administration of intravenous contrast. CONTRAST:  158mL ISOVUE-300 IOPAMIDOL (ISOVUE-300) INJECTION 61% COMPARISON:  11/07/2017 FINDINGS: Lower chest: Known bilateral metastatic pulmonary nodules. The largest in the right lung base measuring 1.1 cm, stable. The largest in the left lung base measuring 1.4 cm, decreased from 2.2 cm on most recent body CT, image 26/63, sequence 4. Hepatobiliary: Known hepatic metastases, 1 in the dome of the liver and 1 in the lateral aspect of the left lobe of the liver are more indistinct and appears somewhat smaller. Cholelithiasis. No evidence of acute cholecystitis. Pancreas: Unremarkable. No pancreatic ductal dilatation or surrounding inflammatory changes. Spleen: Normal in size without focal abnormality. Adrenals/Urinary Tract: Normal appearance of the adrenal glands and right kidney. Ill-defined 2.2 x 2.4 cm mass in the left renal hilum, stable. No evidence of hydronephrosis. Stomach/Bowel: There is diffuse dilation of the colon which demonstrates featureless appearance and liquid contents to the level of the distal sigmoid colon stent.  Vascular/Lymphatic: Aortic atherosclerosis. No enlarged abdominal or pelvic lymph nodes. Reproductive: Unchanged appearance of left adnexal mass. Normal right adnexa. Postcholecystectomy. Other: No abdominal wall hernia or abnormality. No abdominopelvic ascites. Musculoskeletal: No acute or significant osseous findings. Multilevel osteoarthritic changes. IMPRESSION: Probable obstruction at the level of the sigmoid colon stent with diffuse dilation of the upstream colon. The small bowel is fluid-filled but demonstrates normal caliber. Somewhat decreased in size known hepatic metastatic disease. Stable in size known left renal mass. Stable left adnexal mass. Decreased in size left lung base pulmonary metastatic nodule. Electronically Signed   By: Fidela Salisbury M.D.   On: 12/01/2017 17:32   Dg Colon W/water Raelene Bott Cm  Result Date: 12/02/2017 CLINICAL DATA:  84 year old female with colon cancer post sigmoid colon stent placement. Question obstruction? Subsequent encounter. EXAM: COLON WITH WATER SOLUTION CONTRAST COMPARISON:  12/01/2017 CT. Fluoroscopic time: 1 minutes and 24 seconds. 45.4 mGy. FINDINGS: Diluted water-soluble contrast was slowly instilled.  Irregular narrowing of contrast as it traverses through the sigmoid colon stent. Colon proximal to the stent is dilated, predominantly fluid-filled and containing stool. Once contrast was placed into remainder of sigmoid colon patient was slightly uncomfortable and exam terminated and contrast drain. IMPRESSION: Irregular narrowing of contrast as it traverses through the sigmoid colon stent suggestive of tumor in this region. Although this does not cause complete obstruction, this contributes to partial obstruction as the colon proximal to the stent is dilated and predominantly fluid-filled. Complete colon was not imaged as patient was uncomfortable after contrast traversed beyond the stent and study was terminated at this point. Electronically Signed   By:  Genia Del M.D.   On: 12/02/2017 13:28   Scheduled Meds: . enoxaparin (LOVENOX) injection  40 mg Subcutaneous QHS  . latanoprost  1 drop Both Eyes QHS  . metoprolol tartrate  2.5 mg Intravenous Q8H  . polyethylene glycol  17 g Oral TID  . sodium phosphate  1 enema Rectal Once  . thiamine  100 mg Intravenous Daily   Continuous Infusions:   LOS: 2 days   Kerney Elbe, DO [facility] Pager [phone]  If 7PM-7AM, please contact night-coverage [email or web address] Password [facility] 12/03/2017, 4:16 PM

## 2017-12-03 NOTE — Progress Notes (Signed)
[facility] Gastroenterology Progress Note  Mary [name] 84 y.o. 02/07/1933  CC:  Colon obstruction, metastatic adenocarcinoma of the colon   Subjective: Patient continues to feel abdominal pain and abdominal distention. No bowel movement today. Denied nausea and vomiting. Currently on clear liquids diet.   Objective: Vital signs in last 24 hours: Vitals:   12/02/17 2207 12/03/17 0517  BP: 130/70 (!) 152/88  Pulse: 83 (!) 120  Resp: 18 20  Temp: 98.4 F (36.9 C) 98.8 F (37.1 C)  SpO2: 93% 93%    Physical Exam:  Gen. Alert/oriented 3. Not in acute distress Abdomen. Mild distended, hyperactive bowel sounds, nontender, no peritoneal signs  Lab Results: Recent Labs    12/01/17 1349 12/02/17 0445 12/03/17 0525  NA 137 139 139  K 2.9* 3.2* 3.3*  CL 102 110 106  CO2 26 20* 24  GLUCOSE 124* 95 100*  BUN 14 11 12   CREATININE 0.69 0.68 0.63  CALCIUM 9.2 8.3* 9.1  MG 1.7 1.8  --   PHOS  --  2.4*  --    Recent Labs    12/01/17 1349 12/02/17 0445  AST 24 25  ALT 32 30  ALKPHOS 103 94  BILITOT 0.7 0.6  PROT 7.0 6.2*  ALBUMIN 3.3* 3.0*   Recent Labs    12/01/17 1349 12/02/17 0445 12/03/17 0525  WBC 6.6 5.9 5.7  NEUTROABS 3.5  --   --   HGB 12.4 11.6* 12.2  HCT 36.9 36.0 38.3  MCV 94.9 96.5 96.7  PLT 402* 386 430*   No results for input(s): LABPROT, INR in the last 72 hours.    Assessment/Plan: - metastatic colon cancer with history of stricture involving rectosigmoid Junction status post stent placement in December 2018. Patient with worsening constipation for last 1 week. Repeat CT scan showed probable obstruction at the level of previously placed Stent with diffuse dilatation of the proximal colon - Abdominal pain. Most likely from colon obstruction  Recommendations ----------------------- - Barium enema yesterday showed partial obstruction at the level of previously placed stent. Plan for flexible sigmoidoscopy by Dr. Watt Climes tomorrow. - Increase MiraLAX  to 3 times a day. Fleet enema in the morning. - Nothing by mouth past midnight.    Otis Brace MD, [place] 12/03/2017, 8:37 AM  Contact #  [phone]

## 2017-12-03 NOTE — Consult Note (Signed)
Consultation Note Date: 12/03/2017   Patient Name: Mary [name]  DOB: 02/07/1933  MRN: [id number]  Age / Sex: 84 y.o., female  PCP: Moshe Cipro, MD Referring Physician: Kerney Elbe, DO  Reason for Consultation: Establishing goals of care  HPI/Patient Profile: 84 y.o. female  with past medical history of colon cancer, atrial fibrillation, macular degeneration, hyperlipidemia, hiatal hernia, GERD, aneurysm of ascending aorta admitted on 12/01/2017 with abdominal pain, emesis, and constipation x1 week. Patient followed by Dr. Burr Medico for metastatic sigmoid colon cancer to liver and lungs currently receiving palliative chemotherapy. [name] in December 2018 for colon obstruction due to tumor s/p stent placement. In ED, CT on 4/22 revealed probable obstruction at the level of previously placed stent with diffuse dilatation of proximal colon. Barium enema on 4/23 showed partial obstruction at previously placed stent. GI planning for flexible sigmoidoscopy on 4/25. Palliative medicine consultation for goals of care.   Clinical Assessment and Goals of Care: I have reviewed medical records, discussed with care team, and met with patient at bedside. Patient is awake, alert, oriented. Sitting up in bed attempting to eat lunch. C/o abdominal pain and asks me if it is time for prn dose of dilaudid. Notified RN to bring prn dilaudid.   Introduced Palliative Medicine as specialized medical care for people living with serious illness. It focuses on providing relief from the symptoms and stress of a serious illness. The goal is to improve quality of life for both the patient and the family. Mary [name]. Previous PMT notes reviewed.   We discussed a brief life review of the patient. Patient lives alone (in [place]) but has supportive family who like  about 25 minutes from her. Two sons-one local and one son in [place]. Patient speaks of her DIL Colletta [place] assisting with transport back and forth to cancer center. Functionally, patient is independent including driving.   Discussed events leading up to [name], diagnoses, and interventions. Patient is eager to have procedure tomorrow and hopeful that stent will be able to be replaced if necessary.   Patient speaks highly of support from Dr. Burr Medico. She appreciates her honest recommendations regarding goals of care. Patient recalls her conversation with Dr. Burr Medico yesterday, including that she may need surgery if GI is unable to replace stent. After her conversation with Dr. Burr Medico, patient is willing to pursue surgery if this is necessary to "prolong" her life. Patient feels she is tolerating chemotherapy well and that recent scans have showed decrease in lung and liver masses.   I attempted to elicit values and goals of care important to the patient. Independence remains important to her. She has no worries or concerns at this time.   Advanced directives reviewed with patient in epic. Son, Frankey Poot is documented HCPOA. Granddaughter, Roselyn Reef is secondary HCPOA. Roselyn Reef lives in [place] but moving back to [place] in June. Patient speaks of great support she has from family.   Answered questions and concerns. PMT contact information given. Patient agreeable  with continued f/u from palliative services.    SUMMARY OF RECOMMENDATIONS    DNR. Otherwise FULL scope treatment.  GI following. Plan for flexible sigmoidoscopy 4/25.  Patient hopeful for successful procedure tomorrow. After conversation with oncology, she seems willing to pursue surgical intervention if necessary to "prolong" her life. Also hopeful to continue chemotherapy after acute [name].  PMT will follow. May benefit from continued support from outpatient palliative services.   Code Status/Advance Care Planning:  DNR  Symptom  Management:   Per attending  Palliative Prophylaxis:   Bowel Regimen, Delirium Protocol, Frequent Pain Assessment and Turn Reposition  Psycho-social/Spiritual:   Desire for further Chaplaincy support: yes  Additional Recommendations: Caregiving  Support/Resources  Prognosis:   Unable to determine  Discharge Planning: To Be Determined      Primary Diagnoses: Present on Admission: . Bowel obstruction ([place]) . Cancer of sigmoid colon ([place]) . Colon stricture ([place]) . Essential hypertension . Hyperlipidemia . Persistent atrial fibrillation ([place]) . Colon obstruction ([place])   I have reviewed the medical record, interviewed the patient and family, and examined the patient. The following aspects are pertinent.  Past Medical History:  Diagnosis Date  . Aneurysm of ascending aorta (HCC) 12/03/2016   3.7 cm by echo 10/2016  . Colon cancer [facility])    dx via biospy from colonoscopy-- malignant ---  currently having work-up done w/ dr gross (general surgeon) and coordinate surgery w/ urologsit for renal tumor  . Essential hypertension 10/31/2016  . GERD (gastroesophageal reflux disease)   . Glaucoma   . Hiatal hernia   . HOH (hard of hearing)    bilateral even w/ hearing aids  . Hyperlipidemia   . Kidney tumor    left side  . Legally blind in right eye, as defined in [place]    due to macular degeneration  . Macular degeneration   . Nausea    intermittant due to colon mass  . Ovarian cyst   . Persistent atrial fibrillation ([place]) 10/31/2016   on ASA only  . PONV (postoperative nausea and vomiting)   . Wears glasses   . Wears hearing aid    bilateral   Social History   Socioeconomic History  . Marital status: Widowed    Spouse name: Not on file  . Number of children: Not on file  . Years of education: Not on file  . Highest education level: Not on file  Occupational History  . Not on file  Social Needs  . Financial resource strain: Not on file  . Food insecurity:     Worry: Not on file    Inability: Not on file  . Transportation needs:    Medical: Not on file    Non-medical: Not on file  Tobacco Use  . Smoking status: Never Smoker  . Smokeless tobacco: Never Used  Substance and Sexual Activity  . Alcohol use: No  . Drug use: No  . Sexual activity: Not on file  Lifestyle  . Physical activity:    Days per week: Not on file    Minutes per session: Not on file  . Stress: Not on file  Relationships  . Social connections:    Talks on phone: Not on file    Gets together: Not on file    Attends religious service: Not on file    Active member of club or organization: Not on file    Attends meetings of clubs or organizations: Not on file    Relationship status: Not on  file  Other Topics Concern  . Not on file  Social History Narrative  . Not on file   Family History  Problem Relation Age of Onset  . Cancer Sister 38       breast cancer   . Cancer Brother 32       lung cancer   . Diabetes Son   . High blood pressure Son   . Peripheral Artery Disease Mother   . High blood pressure Mother    Scheduled Meds: . enoxaparin (LOVENOX) injection  40 mg Subcutaneous QHS  . latanoprost  1 drop Both Eyes QHS  . metoprolol tartrate  2.5 mg Intravenous Q8H  . polyethylene glycol  17 g Oral TID  . sodium phosphate  1 enema Rectal Once  . thiamine  100 mg Intravenous Daily   Continuous Infusions: PRN Meds:.acetaminophen **OR** acetaminophen, HYDROmorphone (DILAUDID) injection, metoCLOPramide (REGLAN) injection Medications Prior to Admission:  Prior to Admission medications   Medication Sig Start Date End Date Taking? Authorizing Provider  amLODipine (NORVASC) 5 MG tablet Take 1 tablet (5 mg total) by mouth daily. 03/04/17  Yes Skeet Latch, MD  Bisacodyl (DULCOLAX PO) Take 2 tablets by mouth 2 (two) times daily.   Yes [provider]  bisacodyl (DULCOLAX) 10 MG suppository Place 10 mg rectally as needed for moderate constipation.   Yes  [provider]  capecitabine (XELODA) 500 MG tablet Take 4 tablets ( 2000 mg ) in  AM ;  3 tablets ( 1500 mg ) in  PM for  7 days on ,  Off 7 days. 11/24/17  Yes Truitt Merle, MD  diltiazem (CARDIZEM CD) 240 MG 24 hr capsule Take 1 capsule (240 mg total) by mouth daily. 05/28/17  Yes Skeet Latch, MD  docusate sodium (COLACE) 100 MG capsule Take 1 capsule (100 mg total) by mouth 2 (two) times daily. 10/31/17  Yes Truitt Merle, MD  escitalopram (LEXAPRO) 10 MG tablet Take 10 mg by mouth daily.   Yes [provider]  fexofenadine (ALLEGRA) 180 MG tablet Take 180 mg by mouth daily.   Yes [provider]  furosemide (LASIX) 40 MG tablet Take 1 tablet (40 mg total) by mouth daily. 09/29/17  Yes Skeet Latch, MD  latanoprost (XALATAN) 0.005 % ophthalmic solution Place 1 drop into both eyes at bedtime.   Yes [provider]  losartan (COZAAR) 100 MG tablet Take 1 tablet (100 mg total) by mouth daily. 09/29/17  Yes Skeet Latch, MD  Multiple Vitamins-Minerals (VITEYES AREDS ADVANCED) CAPS Take 1 capsule by mouth daily.   Yes [provider]  omeprazole (PRILOSEC) 40 MG capsule Take 40 mg by mouth daily.   Yes [provider]  ondansetron (ZOFRAN ODT) 4 MG disintegrating tablet Take 1 tablet (4 mg total) by mouth every 8 (eight) hours as needed for nausea or vomiting. 07/09/17  Yes Truitt Merle, MD  oxyCODONE (OXY IR/ROXICODONE) 5 MG immediate release tablet Take 1 tablet (5 mg total) by mouth every 6 (six) hours as needed for moderate pain or severe pain. 11/13/17  Yes Truitt Merle, MD  Polyethyl Glycol-Propyl Glycol (SYSTANE) 0.4-0.3 % SOLN Apply 1 drop to eye daily as needed (dry eyes).    Yes [provider]  polyethylene glycol (MIRALAX / GLYCOLAX) packet Take 17 g by mouth daily as needed for moderate constipation. 08/13/17  Yes Bonnielee Haff, MD  Potassium Chloride ER 20 MEQ TBCR Take 20 mEq by mouth daily. 09/04/17  Yes Truitt Merle, MD  sennosides-docusate sodium (SENOKOT-S) 8.6-50 MG tablet Take 1-2 tablets by mouth 3 (three) times daily.   Yes [provider]  urea (CARMOL) 10 % cream Apply topically as needed. 10/31/17  Yes Truitt Merle, MD  clindamycin (CLINDAGEL) 1 % gel Apply topically 2 (two) times daily. Patient not taking: Reported on 12/01/2017 07/24/17   Truitt Merle, MD  lactulose (CEPHULAC) 20 g packet Take 1 packet (20 g total) by mouth every 4 (four) hours as needed (severe constipation). Patient not taking: Reported on 12/01/2017 11/28/17   Truitt Merle, MD  metoprolol tartrate (LOPRESSOR) 25 MG tablet 1/2 TABLET BY MOUTH AS NEEDED FOR PALPITATIONS Patient not taking: Reported on 12/01/2017 07/08/17   Skeet Latch, MD   Allergies  Allergen Reactions  . Benazepril     COUGH   . Tylenol [Acetaminophen]     Anxiety   . Bactrim [Sulfamethoxazole-Trimethoprim] Nausea And Vomiting   Review of Systems  Gastrointestinal: Positive for abdominal pain and constipation.  Neurological: Positive for weakness.   Physical Exam  Constitutional: She is oriented to person, place, and time. She is cooperative.  HENT:  Head: Normocephalic and atraumatic.  Pulmonary/Chest: No accessory muscle usage. No tachypnea. No respiratory distress.  Abdominal: There is tenderness.  Neurological: She is alert and oriented to person, place, and time.  HOH  Skin: Skin is warm and dry.  Psychiatric: She has a normal mood and affect. Her speech is normal and behavior is normal.  Nursing note and vitals reviewed.  Vital Signs: BP 131/84 (BP Location: Right Arm)   Pulse 83   Temp 98.2 F (36.8 C) (Oral)   Resp 18   Ht 5' 6"  (1.676 m)   Wt 80.3 kg (177 lb)   SpO2 92%   BMI 28.57 kg/m  Pain Scale: 0-10   Pain Score: 7    SpO2: SpO2: 92 % O2 Device:SpO2: 92 % O2 Flow Rate: .   IO: Intake/output summary:   Intake/Output Summary (Last 24 hours) at 12/03/2017 1555 Last data filed at 12/03/2017 1500 Gross per 24 hour   Intake 1710 ml  Output -  Net 1710 ml    LBM: Last BM Date: 11/24/17 Baseline Weight: Weight: 80.3 kg (177 lb) Most recent weight: Weight: 80.3 kg (177 lb)     Palliative Assessment/Data: PPS 50%     Time In: 1345 Time Out: 1435 Time Total: 69mn Greater than 50%  of this time was spent counseling and coordinating care related to the above assessment and plan.  Signed by:  MIhor Dow FNP-C Palliative Medicine Team  Phone: [phone]: [phone]  Please contact Palliative Medicine Team phone at [phone] questions and concerns.  For individual provider: See AShea Evans

## 2017-12-04 ENCOUNTER — Encounter (HOSPITAL_COMMUNITY): Payer: Self-pay

## 2017-12-04 ENCOUNTER — Inpatient Hospital Stay (HOSPITAL_COMMUNITY): Payer: Medicare Other | Admitting: Certified Registered Nurse Anesthetist

## 2017-12-04 ENCOUNTER — Encounter (HOSPITAL_COMMUNITY)
Admission: EM | Disposition: A | Discharge status: Home Health | Payer: Self-pay | Source: Home / Self Care | Attending: Internal Medicine

## 2017-12-04 ENCOUNTER — Inpatient Hospital Stay (HOSPITAL_COMMUNITY): Payer: Medicare Other

## 2017-12-04 HISTORY — PX: COLONIC STENT PLACEMENT: SHX5542

## 2017-12-04 HISTORY — PX: FLEXIBLE SIGMOIDOSCOPY: SHX5431

## 2017-12-04 LAB — COMPREHENSIVE METABOLIC PANEL
ALK PHOS: 88 U/L (ref 38–126)
ALT: 27 U/L (ref 14–54)
ANION GAP: 9 (ref 5–15)
AST: 20 U/L (ref 15–41)
Albumin: 3 g/dL — ABNORMAL LOW (ref 3.5–5.0)
BILIRUBIN TOTAL: 0.8 mg/dL (ref 0.3–1.2)
BUN: 10 mg/dL (ref 6–20)
CALCIUM: 8.8 mg/dL — AB (ref 8.9–10.3)
CO2: 26 mmol/L (ref 22–32)
CREATININE: 0.58 mg/dL (ref 0.44–1.00)
Chloride: 103 mmol/L (ref 101–111)
GFR calc non Af Amer: >60 mL/min (ref >60)
GLUCOSE: 102 mg/dL — AB (ref 65–99)
Potassium: 3.5 mmol/L (ref 3.5–5.1)
Sodium: 138 mmol/L (ref 135–145)
TOTAL PROTEIN: 6.3 g/dL — AB (ref 6.5–8.1)

## 2017-12-04 LAB — CBC WITH DIFFERENTIAL/PLATELET
Basophils Absolute: 0 10*3/uL (ref 0.0–0.1)
Basophils Relative: 0 %
Eosinophils Absolute: 0.1 10*3/uL (ref 0.0–0.7)
Eosinophils Relative: 1 %
HEMATOCRIT: 39.3 % (ref 36.0–46.0)
HEMOGLOBIN: 12.8 g/dL (ref 12.0–15.0)
LYMPHS ABS: 1.3 10*3/uL (ref 0.7–4.0)
LYMPHS PCT: 23 %
MCH: 31.1 pg (ref 26.0–34.0)
MCHC: 32.6 g/dL (ref 30.0–36.0)
MCV: 95.6 fL (ref 78.0–100.0)
MONOS PCT: 20 %
Monocytes Absolute: 1.2 10*3/uL — ABNORMAL HIGH (ref 0.1–1.0)
NEUTROS ABS: 3.2 10*3/uL (ref 1.7–7.7)
NEUTROS PCT: 56 %
Platelets: 436 10*3/uL — ABNORMAL HIGH (ref 150–400)
RBC: 4.11 MIL/uL (ref 3.87–5.11)
RDW: 16.7 % — ABNORMAL HIGH (ref 11.5–15.5)
WBC: 5.8 10*3/uL (ref 4.0–10.5)

## 2017-12-04 LAB — MRSA PCR SCREENING: MRSA BY PCR: POSITIVE — AB

## 2017-12-04 LAB — MAGNESIUM: Magnesium: 1.8 mg/dL (ref 1.7–2.4)

## 2017-12-04 LAB — PHOSPHORUS: Phosphorus: 3.1 mg/dL (ref 2.5–4.6)

## 2017-12-04 SURGERY — SIGMOIDOSCOPY, FLEXIBLE
Anesthesia: Monitor Anesthesia Care

## 2017-12-04 SURGERY — SIGMOIDOSCOPY, FLEXIBLE
Anesthesia: Moderate Sedation

## 2017-12-04 MED ORDER — PROPOFOL 500 MG/50ML IV EMUL
INTRAVENOUS | Status: DC | PRN
  Administered 2017-12-04: 50 ug/kg/min via INTRAVENOUS

## 2017-12-04 MED ORDER — POTASSIUM CHLORIDE 10 MEQ/100ML IV SOLN
10.0000 meq | INTRAVENOUS | Status: AC
Start: 2017-12-04 — End: 2017-12-04
  Administered 2017-12-04 (×3): 10 meq via INTRAVENOUS
  Filled 2017-12-04 (×2): qty 100

## 2017-12-04 MED ORDER — POTASSIUM CHLORIDE 10 MEQ/100ML IV SOLN
INTRAVENOUS | Status: AC
  Administered 2017-12-04: 10 meq via INTRAVENOUS
  Filled 2017-12-04: qty 100

## 2017-12-04 MED ORDER — CHLORHEXIDINE GLUCONATE CLOTH 2 % EX PADS
6.0000 | MEDICATED_PAD | Freq: Every day | CUTANEOUS | Status: DC
  Administered 2017-12-05 – 2017-12-07 (×3): 6 via TOPICAL

## 2017-12-04 MED ORDER — MUPIROCIN 2 % EX OINT
1.0000 "application " | TOPICAL_OINTMENT | Freq: Two times a day (BID) | CUTANEOUS | Status: DC
  Administered 2017-12-04 – 2017-12-07 (×6): 1 via NASAL
  Filled 2017-12-04 (×2): qty 22

## 2017-12-04 MED ORDER — PROPOFOL 10 MG/ML IV BOLUS
INTRAVENOUS | Status: DC | PRN
  Administered 2017-12-04 (×7): 10 mg via INTRAVENOUS

## 2017-12-04 MED ORDER — SODIUM CHLORIDE 0.9 % IV SOLN
INTRAVENOUS | Status: DC
  Administered 2017-12-04: 08:00:00 via INTRAVENOUS

## 2017-12-04 MED ORDER — PROPOFOL 10 MG/ML IV BOLUS
INTRAVENOUS | Status: AC
  Filled 2017-12-04: qty 60

## 2017-12-04 MED ORDER — LIDOCAINE 2% (20 MG/ML) 5 ML SYRINGE
INTRAMUSCULAR | Status: DC | PRN
  Administered 2017-12-04: 80 mg via INTRAVENOUS

## 2017-12-04 MED ORDER — ONDANSETRON HCL 4 MG/2ML IJ SOLN
INTRAMUSCULAR | Status: DC | PRN
  Administered 2017-12-04: 4 mg via INTRAVENOUS

## 2017-12-04 MED ORDER — METOPROLOL TARTRATE 5 MG/5ML IV SOLN
2.5000 mg | Freq: Once | INTRAVENOUS | Status: AC
  Administered 2017-12-04: 2.5 mg via INTRAVENOUS
  Filled 2017-12-04: qty 5

## 2017-12-04 MED ORDER — PHENYLEPHRINE 40 MCG/ML (10ML) SYRINGE FOR IV PUSH (FOR BLOOD PRESSURE SUPPORT)
PREFILLED_SYRINGE | INTRAVENOUS | Status: DC | PRN
  Administered 2017-12-04: 120 ug via INTRAVENOUS
  Administered 2017-12-04: 80 ug via INTRAVENOUS
  Administered 2017-12-04 (×3): 120 ug via INTRAVENOUS

## 2017-12-04 MED ORDER — HYDRALAZINE HCL 20 MG/ML IJ SOLN: 10.0000 mg | Freq: Four times a day (QID) | INTRAMUSCULAR | Status: DC | PRN

## 2017-12-04 SURGICAL SUPPLY — 1 items: Wallflex colonic stent (Stent) ×3 IMPLANT

## 2017-12-04 NOTE — Progress Notes (Signed)
Patient given Tap water enema once. Patient has had two bowel movements that were brown and watery. Will continue to monitor patient closely.

## 2017-12-04 NOTE — Progress Notes (Signed)
Mary [name] 11:48 AM  Subjective: Patient not doing well on clear liquids but the enemas helped and no new complaints  Objective: Vital signs stable afebrile no acute distress exam please see preassessment evaluation labs okay no new x-rays  Assessment: Obstructing colon cancer  Plan: Okay to proceed with flexible sigmoidoscopy and evaluation of previously placed stent with anesthesia assistance  [facility]  Pager [phone] After 5PM or if no answer call [phone]

## 2017-12-04 NOTE — Transfer of Care (Signed)
Immediate Anesthesia Transfer of Care Note  Patient: Mary [name]  Procedure(s) Performed: FLEXIBLE SIGMOIDOSCOPY (N/A ) COLONIC STENT PLACEMENT (N/A )  Patient Location: Endoscopy Unit  Anesthesia Type:MAC  Level of Consciousness: drowsy and patient cooperative  Airway & Oxygen Therapy: Patient Spontanous Breathing and Patient connected to face mask oxygen  Post-op Assessment: Report given to RN and Post -op Vital signs reviewed and stable  Post vital signs: Reviewed and stable  Last Vitals:  Vitals Value Taken Time  BP    Temp    Pulse 104 12/04/2017 12:46 PM  Resp 21 12/04/2017 12:46 PM  SpO2 96 % 12/04/2017 12:46 PM  Vitals shown include unvalidated device data.  Last Pain:  Vitals:   12/04/17 1043  TempSrc: Oral  PainSc: 7       Patients Stated Pain Goal: 2 (97/47/18 5501)  Complications: No apparent anesthesia complications

## 2017-12-04 NOTE — Progress Notes (Signed)
Daily Progress Note   Patient Name: Mary [name]       Date: 12/04/2017 DOB: 02/07/1933  Age: 84 y.o. MRN#: [id number] Attending Physician: Kerney Elbe, DO Primary Care Physician: Moshe Cipro, MD Admit Date: 12/01/2017  Reason for Consultation/Follow-up: Establishing goals of care  Subjective: Patient awake, alert, oriented. Recently returned from procedure. She states relief from pain from prn dilaudid given recently. Tolerating sips of water.   GOC:  Daughter-in-law Mary [name]) at bedside. Introduced role of palliative medicine. Discussed events leading up to hospitalization and hospital diagnoses/interventions. Mary [name] is hopeful that Mary [name] will recover quickly, as she did after previous hospitalization for stent placement. Mary [name] also speaks very highly of GI and oncology care teams taking excellent care of her mother-in-law. They are hopeful Mary [name] can continue chemotherapy after discharged.  Mary [name] does spend time speaking of the importance of her "quality of life" and "independence." Family is hopeful to keep her as independent as possible for as long as possible. Mary [name] speaks of elderly individuals losing independence and then losing hope.   Mary [name] mentions returning home with home health after hospitalization. She brings up having [facility] and that this company has hospice services if necessary. Educated on hospice philosophy and transition from aggressive interventions to a comfort focused pathway. They are NOT ready for hospice services at this time but glad to know they have this resource when ready.   Therapeutic listening as Mary [name] shares stories.     Length of Stay: 3  Current Medications: Scheduled Meds:  . enoxaparin (LOVENOX)  injection  40 mg Subcutaneous QHS  . latanoprost  1 drop Both Eyes QHS  . metoprolol tartrate  2.5 mg Intravenous Q8H  . polyethylene glycol  17 g Oral TID  . sodium phosphate  1 enema Rectal Once  . thiamine  100 mg Intravenous Daily   Continuous Infusions:  PRN Meds: acetaminophen **OR** acetaminophen, hydrALAZINE, HYDROmorphone (DILAUDID) injection, metoCLOPramide (REGLAN) injection  Physical Exam  Constitutional: She is oriented to person, place, and time. She is cooperative.  HENT:  Head: Normocephalic and atraumatic.  Pulmonary/Chest: No accessory muscle usage. No tachypnea. No respiratory distress.  Abdominal: There is tenderness.  Neurological: She is alert and oriented to person, place, and time.  Skin: Skin is warm and dry.  Nursing note and vitals reviewed.  Vital Signs: BP (!) 157/96 (BP Location: Right Arm)   Pulse 95   Temp 98 F (36.7 C) (Oral)   Resp 16   Ht 5\' 6"  (1.676 m)   Wt 80.3 kg (177 lb)   SpO2 93%   BMI 28.57 kg/m  SpO2: SpO2: 93 % O2 Device: O2 Device: Room Air O2 Flow Rate: O2 Flow Rate (L/min): 10 L/min  Intake/output summary:   Intake/Output Summary (Last 24 hours) at 12/04/2017 1433 Last data filed at 12/04/2017 1242 Gross per 24 hour  Intake 800 ml  Output -  Net 800 ml   LBM: Last BM Date: 12/03/17 Baseline Weight: Weight: 80.3 kg (177 lb) Most recent weight: Weight: 80.3 kg (177 lb)       Palliative Assessment/Data: PPS 60%     Patient Active Problem List   Diagnosis Date Noted  . Generalized abdominal pain   . Palliative care by specialist   . Colon obstruction ([place]) 12/01/2017  . Colon stricture ([place])   . Diarrhea   . Hyperglycemia 08/08/2017  . Rectosigmoid stricture from obstructing cancer s/p endoscopic stenting 08/11/2017 08/07/2017  . Bowel obstruction ([place]) 08/07/2017  . Goals of care, counseling/discussion 12/05/2016  . Aneurysm of ascending aorta (HCC) 12/03/2016  . Persistent atrial fibrillation  ([place]) 10/31/2016  . Essential hypertension 10/31/2016  . Hyperlipidemia 10/31/2016  . Cancer of sigmoid colon ([place]) 10/25/2016    Palliative Care Assessment & Plan   Patient Profile: 84 y.o. female  with past medical history of colon cancer, atrial fibrillation, macular degeneration, hyperlipidemia, hiatal hernia, GERD, aneurysm of ascending aorta admitted on 12/01/2017 with abdominal pain, emesis, and constipation x1 week. Patient followed by Dr. Burr Medico for metastatic sigmoid colon cancer to liver and lungs currently receiving palliative chemotherapy. Hospitalization in December 2018 for colon obstruction due to tumor s/p stent placement. In ED, CT on 4/22 revealed probable obstruction at the level of previously placed stent with diffuse dilatation of proximal colon. Barium enema on 4/23 showed partial obstruction at previously placed stent. GI planning for flexible sigmoidoscopy on 4/25. Palliative medicine consultation for goals of care.   Assessment: Metastatic sigmoid colon cancer Colonic obstruction Persistent atrial fibrillation Essential hypertension Generalized abdominal pain  Recommendations/Plan:  DNR in the event of cardiac arrest. Otherwise, FULL scope treatment.   S/p flex sig today with stent placement. GI following.   Patient/daughter hopeful for clinical improvement with stent placement and plan to further pursue chemotherapy after acute hospitalization.   May benefit from continued support from outpatient palliative.    Code Status: DNR   Code Status Orders  (From admission, onward)        Start     Ordered   12/01/17 2138  Do not attempt resuscitation (DNR)  Continuous    Question Answer Comment  In the event of cardiac or respiratory ARREST Do not call a "code blue"   In the event of cardiac or respiratory ARREST Do not perform Intubation, CPR, defibrillation or ACLS   In the event of cardiac or respiratory ARREST Use medication by any route, position, wound  care, and other measures to relive pain and suffering. May use oxygen, suction and manual treatment of airway obstruction as needed for comfort.      12/01/17 2137    Code Status History    Date Active Date Inactive Code Status Order ID Comments User Context   08/07/2017 2053 08/16/2017 1753 DNR [id number]  Karmen Bongo, MD ED    Advance Directive Documentation  Most Recent Value  Type of Advance Directive  Healthcare Power of Attorney, Living will  Pre-existing out of facility DNR order (yellow form or pink MOST form)  -  "MOST" Form in Place?  -       Prognosis:   Unable to determine  Discharge Planning:  To Be Determined  Care plan was discussed with patient and daughter  Thank you for allowing the Palliative Medicine Team to assist in the care of this patient.   Time In: 1400 Time Out: 1435 Total Time 33min Prolonged Time Billed  no      Greater than 50%  of this time was spent counseling and coordinating care related to the above assessment and plan.  Ihor Dow, FNP-C Palliative Medicine Team  Phone: [phone] Fax: [phone]  Please contact Palliative Medicine Team phone at [phone] for questions and concerns.

## 2017-12-04 NOTE — Anesthesia Preprocedure Evaluation (Addendum)
Anesthesia Evaluation  Patient identified by MRN, date of birth, ID band Patient awake    Reviewed: Allergy & Precautions, NPO status , Patient's Chart, lab work & pertinent test results  History of Anesthesia Complications (+) PONV and history of anesthetic complications  Airway Mallampati: II  TM Distance: >3 FB Neck ROM: Full    Dental no notable dental hx.    Pulmonary neg pulmonary ROS,  Lung CA   Pulmonary exam normal breath sounds clear to auscultation       Cardiovascular hypertension, Pt. on medications and Pt. on home beta blockers Normal cardiovascular exam+ dysrhythmias Atrial Fibrillation  Rhythm:Regular Rate:Normal  ECG: A-F, rate 95, LAFB  Sees cardiologist   Neuro/Psych negative neurological ROS  negative psych ROS   GI/Hepatic Neg liver ROS, hiatal hernia, GERD  Medicated and Controlled,  Endo/Other  negative endocrine ROS  Renal/GU      Musculoskeletal   Abdominal   Peds  Hematology HLD   Anesthesia Other Findings colonic stricture A-Fib with RVR  Reproductive/Obstetrics                            Anesthesia Physical Anesthesia Plan  ASA: IV  Anesthesia Plan: MAC   Post-op Pain Management:    Induction: Intravenous  PONV Risk Score and Plan: 3 and Treatment may vary due to age or medical condition and Propofol infusion  Airway Management Planned: Natural Airway  Additional Equipment:   Intra-op Plan:   Post-operative Plan:   Informed Consent: I have reviewed the patients History and Physical, chart, labs and discussed the procedure including the risks, benefits and alternatives for the proposed anesthesia with the patient or authorized representative who has indicated his/her understanding and acceptance.   Dental advisory given  Plan Discussed with: CRNA  Anesthesia Plan Comments:        Anesthesia Quick Evaluation

## 2017-12-04 NOTE — Progress Notes (Signed)
PROGRESS NOTE    Mary [name]  GUR:[id number] DOB: 02/07/1933 DOA: 12/01/2017 PCP: Moshe Cipro, MD   Brief Narrative:  The patient is an 84 y.o.femalewith medical history significant of Metastatic sigmoid colon Adenocarcinomawith colonic strictures status post stenting by [facility],metastases to the liver, metastasis to the lungs, left renal and left adnexal masses left hydronephrosis, history of paroxysmal atrial fibrillation, HTN, HLD and other comorbids who presented with1 week of constipation unable to pass any stool now unable to pass any flatus either she has tried to use MiraLAX and doubled her dose but no relief. Was diagnosed with a likely colonic stent failureandhypokalemia which was replace. GI consulted and planning on doing Flex Sigmoidoscopy to evaluate the Stent and possibly exchange. Patient to have Sigmoidoscopy done this AM by Dr. Watt Climes.   Assessment & Plan:   Active Problems:   Cancer of sigmoid colon (HCC)   Persistent atrial fibrillation (HCC)   Essential hypertension   Hyperlipidemia   Bowel obstruction (HCC)   Colon stricture (HCC)   Colon obstruction (HCC)   Generalized abdominal pain   Palliative care by specialist  Colonic obstruction likely 2/2 due to Tumor -CT scan shows obstruction at the level of the stent in the sigmoid colon.  -Gastroenterology has been consulted and feel that her abdominal pain and distention is secondary to the colonic obstruction -Patient has history of a stricture involving the rectosigmoid junction which is status post stent placement in December 2018 -Barium enema 12/02/17 showed partial obstruction of the level of the previously placed stent -To undergo flexible sigmoidoscopy by Dr. Watt Climes and  Merril Abbe was increased to 3 times a day but patient was not able to tolerate it but she did receive a Tap Water Enema this AM and had 2 brown liquid bowel movements -Currently NPO -C/w Metaclopamide 5 mg IV every 6 as needed for  nausea -Pain control with Hydromorphone 0.5-1 mg every 4 hours as needed for severe pain  Metastatic Sigmoid Colon Cancer -Followed by Oncology Dr. Burr Medico as an outpatient -Has metastasis to the liver and lungs -On palliative chemotherapy with Xeloda and Vectibix -Chemotherapy being held currently and per oncology if she recovers well from obstruction will continue chemo as an option -IF Bowel obstruction is not able to be resolved and hospice would be reasonable and palliative has been consulted for evaluation -Palliative medicine team following and patient awaiting the sigmoidoscopy in AM; per their conversation with the patient continue full scope of treatment patient is a DNR at this time -CEA was 3.06 on 4/19  Essential Hypertension -Antihypertensives with Cardizem, Amlodipine, and Losartan are being held currently -Blood pressure was elevated at 162/95 -We will add IV Hydralazine 10 mg q6hprn for SBP>160 or DBP>100  Hypokalemia -Patient's potassium level this morning was 3.5 -Replete with IV KCl 30 mEq again today  -Continue to monitor and replete as necessary -Repeat CMP in a.m.  Hyperlipidemia -Stable. -Upon further review patient is not on a Statin at home  Persistent Atrial Fibrillation -CHA2DS2VASc Score is 3 but not on anticoagulation due to risk of bleeding.   -Heart rate controlled with Cardizem at home but since NPO is getting 2.5 mg of IV metoprolol tartrate q8h -Continue with telemetry monitoring  Thrombocytosis -Trending Upwards -Patient's platelet count went from 386 -> 436 -Continue to monitor and repeat CBC in a.m.  Hypophosphatemia -Patient's Phos level was 2.4 yesterday and this AM was 3.1 -Continue to monitor and repeat Phos Level in AM and replete if still low  Depression -Patient's Escitalopram 10 mg p.o. daily is currently being held until she is able to take po  Seasonal Allergies/Allergic Rhinitis -Currently her Fexofenadine 180 mg po Daily is  being held  GERD -Currently her Omeprazole 40 mg p.o. daily is being held  Glaucoma -Continue with Latanoprost 0.005% ophthalmic solution 1 drop in both eyes at bedtime  DVT prophylaxis: Enoxaparin 40 mg subcu daily at bedtime Code Status: DO NOT RESUSCITATE Family Communication: Discussed with Family  Disposition Plan: Remain Inpatient for continued workup and Sigmoidoscopy this AM   Consultants:   Gastroenterology [facility])  Oncology  Palliative Care Medicine   Procedures:   Antimicrobials:  Anti-infectives (From admission, onward)   None     Subjective: Patient was seen and examined this morning she was still having some abdominal discomfort but is not as bad.  States she had 2 liquidy bowel movement after she was given a tap water enema.  Was afraid to take the MiraLAX yesterday and states that she did not feel she could tolerate it.  Denied any chest pain, shortness shortness breath, nausea, vomiting.  Ready for the procedure.  Objective: Vitals:   12/03/17 2014 12/04/17 0451 12/04/17 1043 12/04/17 1121  BP: (!) 151/89 (!) 152/91 (!) 162/95   Pulse: 97 (!) 102 (!) 110 100  Resp: 18 18 12    Temp: 98.2 F (36.8 C) 98 F (36.7 C) 98.7 F (37.1 C)   TempSrc: Oral Oral Oral   SpO2: 94% 94% 93%   Weight:   80.3 kg (177 lb)   Height:   5\' 6"  (1.676 m)     Intake/Output Summary (Last 24 hours) at 12/04/2017 1126 Last data filed at 12/03/2017 1500 Gross per 24 hour  Intake 300 ml  Output -  Net 300 ml   Filed Weights   12/01/17 2025 12/04/17 1043  Weight: 80.3 kg (177 lb) 80.3 kg (177 lb)   Examination: Physical Exam:  Constitutional: Well-nourished well-developed female currently no acute distress appears calm this if she feels improved after having a few bowel movements. Eyes: Lids and conjunctive appear normal.  Sclera anicteric. ENMT: External ears and nose appear normal.  Grossly normal hearing.  Mucous members appear moist. Neck: Appears supple with no  JVD. Respiratory: Diminished to auscultation bilaterally, no appreciable wheezing, rales, rhonchi.  Unlabored breathing patient has normal respiratory effort. Cardiovascular: Irregularly irregular.  No appreciable murmurs, rubs, gallops.  No lower extremity edema noted Abdomen: Soft slightly distended but tender to palpate.  Bowel sounds present but were slightly diminished today GU: Deferred Musculoskeletal: No contractures, no cyanosis.  Patient has no joint deformities  Skin: No appreciable rashes, lesions or ulcers on the limited skin evaluation.  Warm and dry Neurologic: Cranial nerves II through XII grossly intact with no appreciable focal deficit. Psychiatric: Normal mood and affect.  Intact judgment insight.  Patient is awake alert and oriented x3  Data Reviewed: I have personally reviewed following labs and imaging studies  CBC: Recent Labs  Lab 11/28/17 0842 12/01/17 1349 12/02/17 0445 12/03/17 0525 12/04/17 0448  WBC 8.9 6.6 5.9 5.7 5.8  NEUTROABS 5.1 3.5  --   --  3.2  HGB 11.6 12.4 11.6* 12.2 12.8  HCT 35.7 36.9 36.0 38.3 39.3  MCV 97.3 94.9 96.5 96.7 95.6  PLT 324 402* 386 430* 161*   Basic Metabolic Panel: Recent Labs  Lab 11/28/17 0842 12/01/17 1349 12/02/17 0445 12/03/17 0525 12/04/17 0448  NA 138 137 139 139 138  K 3.0* 2.9* 3.2*  3.3* 3.5  CL 105 102 110 106 103  CO2 25 26 20* 24 26  GLUCOSE 86 124* 95 100* 102*  BUN 11 14 11 12 10   CREATININE 0.82 0.69 0.68 0.63 0.58  CALCIUM 9.0 9.2 8.3* 9.1 8.8*  MG 1.7 1.7 1.8  --  1.8  PHOS  --   --  2.4*  --  3.1   GFR: Estimated Creatinine Clearance: 57.9 mL/min (by C-G formula based on SCr of 0.58 mg/dL). Liver Function Tests: Recent Labs  Lab 11/28/17 0842 12/01/17 1349 12/02/17 0445 12/04/17 0448  AST 51* 24 25 20   ALT 34 32 30 27  ALKPHOS 128 103 94 88  BILITOT 0.9 0.7 0.6 0.8  PROT 7.5 7.0 6.2* 6.3*  ALBUMIN 3.4* 3.3* 3.0* 3.0*   Recent Labs  Lab 12/01/17 1349  LIPASE 31   No results for  input(s): AMMONIA in the last 168 hours. Coagulation Profile: No results for input(s): INR, PROTIME in the last 168 hours. Cardiac Enzymes: No results for input(s): CKTOTAL, CKMB, CKMBINDEX, TROPONINI in the last 168 hours. BNP (last 3 results) No results for input(s): PROBNP in the last 8760 hours. HbA1C: No results for input(s): HGBA1C in the last 72 hours. CBG: No results for input(s): GLUCAP in the last 168 hours. Lipid Profile: No results for input(s): CHOL, HDL, LDLCALC, TRIG, CHOLHDL, LDLDIRECT in the last 72 hours. Thyroid Function Tests: Recent Labs    12/02/17 0445  TSH 0.957   Anemia Panel: No results for input(s): VITAMINB12, FOLATE, FERRITIN, TIBC, IRON, RETICCTPCT in the last 72 hours. Sepsis Labs: No results for input(s): PROCALCITON, LATICACIDVEN in the last 168 hours.  No results found for this or any previous visit (from the past 240 hour(s)).   Radiology Studies: Dg Colon W/water Sol Cm  Result Date: 12/02/2017 CLINICAL DATA:  84 year old female with colon cancer post sigmoid colon stent placement. Question obstruction? Subsequent encounter. EXAM: COLON WITH WATER SOLUTION CONTRAST COMPARISON:  12/01/2017 CT. Fluoroscopic time: 1 minutes and 24 seconds. 45.4 mGy. FINDINGS: Diluted water-soluble contrast was slowly instilled. Irregular narrowing of contrast as it traverses through the sigmoid colon stent. Colon proximal to the stent is dilated, predominantly fluid-filled and containing stool. Once contrast was placed into remainder of sigmoid colon patient was slightly uncomfortable and exam terminated and contrast drain. IMPRESSION: Irregular narrowing of contrast as it traverses through the sigmoid colon stent suggestive of tumor in this region. Although this does not cause complete obstruction, this contributes to partial obstruction as the colon proximal to the stent is dilated and predominantly fluid-filled. Complete colon was not imaged as patient was  uncomfortable after contrast traversed beyond the stent and study was terminated at this point. Electronically Signed   By: Genia Del M.D.   On: 12/02/2017 13:28   Scheduled Meds: . [MAR Hold] enoxaparin (LOVENOX) injection  40 mg Subcutaneous QHS  . [MAR Hold] latanoprost  1 drop Both Eyes QHS  . [MAR Hold] metoprolol tartrate  2.5 mg Intravenous Q8H  . [MAR Hold] polyethylene glycol  17 g Oral TID  . [MAR Hold] sodium phosphate  1 enema Rectal Once  . [MAR Hold] thiamine  100 mg Intravenous Daily   Continuous Infusions: . sodium chloride 20 mL/hr at 12/04/17 0820  . [MAR Hold] potassium chloride       LOS: 3 days   Kerney Elbe, DO [facility] Pager [phone]  If 7PM-7AM, please contact night-coverage [email or web address] Password [email or web address] 12/04/2017, 11:26 AM

## 2017-12-04 NOTE — Care Management Important Message (Signed)
Important Message  Patient Details  Name: Mary [name] MRN: [id number] Date of Birth: 02/07/1933   Medicare Important Message Given:  Yes    [name] 12/04/2017, 11:45 AMImportant Message  Patient Details  Name: Mary [name] MRN: [id number] Date of Birth: 02/07/1933   Medicare Important Message Given:  Yes    [name] 12/04/2017, 11:45 AM

## 2017-12-04 NOTE — Op Note (Signed)
[facility] Patient Name: Mary [name] Procedure Date: 12/04/2017 MRN: [id number] Attending MD: Clarene Essex , MD Date of Birth: 02/07/1933 CSN: [id number] Age: 84 Admit Type: Inpatient Procedure:                Flexible Sigmoidoscopy Indications:              Cancer in the sigmoid colon, Colonic obstruction Providers:                Clarene Essex, MD, Carolynn Comment RN, RN, Cherylynn Ridges, Technician, Cathe Mons, CRNA Referring MD:              Medicines:                Propofol total dose 230 mg IV, 80 mg of IV lidocaine Complications:            No immediate complications. Estimated Blood Loss:     Estimated blood loss: none. Procedure:                Pre-Anesthesia Assessment:                           - Prior to the procedure, a History and Physical                            was performed, and patient medications and                            allergies were reviewed. The patient's tolerance of                            previous anesthesia was also reviewed. The risks                            and benefits of the procedure and the sedation                            options and risks were discussed with the patient.                            All questions were answered, and informed consent                            was obtained. Prior Anticoagulants: The patient has                            taken aspirin, last dose was 2 days prior to                            procedure. ASA Grade Assessment: III - A patient                            with severe systemic disease. After reviewing the  risks and benefits, the patient was deemed in                            satisfactory condition to undergo the procedure.                           After obtaining informed consent, the scope was                            passed under direct vision. The [id number])                            scope was introduced  through the anus and advanced                            to the the rectosigmoid junction. The flexible                            sigmoidoscopy was technically difficult and complex                            due to abnormal anatomy. The patient tolerated the                            procedure well. The quality of the bowel                            preparation was fair. Scope In: Scope Out: Findings:      A Wallstent was found in the recto-sigmoid colon.      A malignant-appearing, intrinsic severe stenosis measuring 4 mm (inner       diameter) was found in the distal sigmoid colon inside the stent and was       non-traversed. This was stented with a 22 mm x 9 cm WallFlex stent under       fluoroscopic guidance. We first advanced the balloon ERCP catheter with       the JAG long wire through it and under fluoroscopy guidance using dye to       confirm we were in the colonic lumen we advanced the wire and under       fluoroscopy guidance and endoscopic guidance remove the balloon catheter       and placed in the stent in the customary fashion under both fluoroscopy       and endoscopic guidance seemingly in good position with the waist in the       middle of the stent and she was passing air and lots of stool which made       further visualization difficult      The exam was otherwise normal throughout the examined colon. Impression:               - Preparation of the colon was fair.                           - Stent in the colon.                           -  Stricture in the distal sigmoid colon. Prosthesis                            placed.                           - No specimens collected. Moderate Sedation:      N/A- Per Anesthesia Care Recommendation:           - Clear liquid diet today. Continue daily MiraLAX                            long-term possibly advance diet tomorrow if doing                            well after seen by either myself or one of my                             partners                           - Continue present medications.                           - Return to GI clinic PRN.                           - Telephone GI clinic if symptomatic PRN. Procedure Code(s):        --- Professional ---                           [id number], 20, Sigmoidoscopy, flexible; with placement                            of endoscopic stent (includes pre- and                            post-dilation and guide wire passage, when                            performed) Diagnosis Code(s):        --- Professional ---                           W26.378, Other intestinal obstruction unspecified                            as to partial versus complete obstruction                           C18.7, Malignant neoplasm of sigmoid colon                           K56.609, Unspecified intestinal obstruction,                            unspecified as to partial versus complete  obstruction CPT copyright 2017 [employer]. All rights reserved. The codes documented in this report are preliminary and upon coder review may  be revised to meet current compliance requirements. Clarene Essex, MD 12/04/2017 12:47:36 PM This report has been signed electronically. Number of Addenda: 0

## 2017-12-04 NOTE — Anesthesia Postprocedure Evaluation (Signed)
Anesthesia Post Note  Patient: Mary [name]  Procedure(s) Performed: FLEXIBLE SIGMOIDOSCOPY (N/A ) COLONIC STENT PLACEMENT (N/A )     Patient location during evaluation: PACU Anesthesia Type: MAC Level of consciousness: awake Pain management: pain level controlled Vital Signs Assessment: post-procedure vital signs reviewed and stable Respiratory status: spontaneous breathing, nonlabored ventilation, respiratory function stable and patient connected to nasal cannula oxygen Cardiovascular status: stable and blood pressure returned to baseline Postop Assessment: no apparent nausea or vomiting Anesthetic complications: no    Last Vitals:  Vitals:   12/04/17 1310 12/04/17 1335  BP: (!) 142/80 (!) 157/96  Pulse: 99 95  Resp: 20 16  Temp:  36.7 C  SpO2: 95% 93%    Last Pain:  Vitals:   12/04/17 1343  TempSrc:   PainSc: 7                  Lanisa Ishler P Marykate Heuberger

## 2017-12-05 ENCOUNTER — Encounter (HOSPITAL_COMMUNITY): Payer: Self-pay | Admitting: Gastroenterology

## 2017-12-05 DIAGNOSIS — R1084 Generalized abdominal pain: Secondary | ICD-10-CM

## 2017-12-05 LAB — CBC WITH DIFFERENTIAL/PLATELET
Basophils Absolute: 0 10*3/uL (ref 0.0–0.1)
Basophils Relative: 0 %
EOS PCT: 4 %
Eosinophils Absolute: 0.2 10*3/uL (ref 0.0–0.7)
HCT: 37.6 % (ref 36.0–46.0)
Hemoglobin: 12.3 g/dL (ref 12.0–15.0)
LYMPHS ABS: 1.3 10*3/uL (ref 0.7–4.0)
Lymphocytes Relative: 22 %
MCH: 31.1 pg (ref 26.0–34.0)
MCHC: 32.7 g/dL (ref 30.0–36.0)
MCV: 94.9 fL (ref 78.0–100.0)
MONO ABS: 1.3 10*3/uL — AB (ref 0.1–1.0)
MONOS PCT: 22 %
Neutro Abs: 3.1 10*3/uL (ref 1.7–7.7)
Neutrophils Relative %: 52 %
PLATELETS: 351 10*3/uL (ref 150–400)
RBC: 3.96 MIL/uL (ref 3.87–5.11)
RDW: 16.9 % — AB (ref 11.5–15.5)
WBC: 6 10*3/uL (ref 4.0–10.5)

## 2017-12-05 LAB — COMPREHENSIVE METABOLIC PANEL
ALT: 21 U/L (ref 14–54)
AST: 29 U/L (ref 15–41)
Albumin: 2.6 g/dL — ABNORMAL LOW (ref 3.5–5.0)
Alkaline Phosphatase: 76 U/L (ref 38–126)
Anion gap: 10 (ref 5–15)
BILIRUBIN TOTAL: 0.7 mg/dL (ref 0.3–1.2)
BUN: 6 mg/dL (ref 6–20)
CO2: 25 mmol/L (ref 22–32)
Calcium: 8.5 mg/dL — ABNORMAL LOW (ref 8.9–10.3)
Chloride: 104 mmol/L (ref 101–111)
Creatinine, Ser: 0.62 mg/dL (ref 0.44–1.00)
GFR calc Af Amer: >60 mL/min (ref >60)
Glucose, Bld: 90 mg/dL (ref 65–99)
Potassium: 3.9 mmol/L (ref 3.5–5.1)
Sodium: 139 mmol/L (ref 135–145)
TOTAL PROTEIN: 5.8 g/dL — AB (ref 6.5–8.1)

## 2017-12-05 LAB — MAGNESIUM: MAGNESIUM: 1.7 mg/dL (ref 1.7–2.4)

## 2017-12-05 LAB — PHOSPHORUS: Phosphorus: 3 mg/dL (ref 2.5–4.6)

## 2017-12-05 MED ORDER — ALUM & MAG HYDROXIDE-SIMETH 200-200-20 MG/5ML PO SUSP
15.0000 mL | ORAL | Status: DC | PRN
  Administered 2017-12-05: 15 mL via ORAL
  Filled 2017-12-05: qty 30

## 2017-12-05 MED ORDER — OCUVITE-LUTEIN PO CAPS
1.0000 | ORAL_CAPSULE | Freq: Every day | ORAL | Status: DC
  Filled 2017-12-05: qty 1

## 2017-12-05 MED ORDER — POLYETHYL GLYCOL-PROPYL GLYCOL 0.4-0.3 % OP SOLN: 1.0000 [drp] | Freq: Every day | OPHTHALMIC | Status: DC | PRN

## 2017-12-05 MED ORDER — SENNA-DOCUSATE SODIUM 8.6-50 MG PO TABS: 1.0000 | ORAL_TABLET | Freq: Three times a day (TID) | ORAL | Status: DC

## 2017-12-05 MED ORDER — DOCUSATE SODIUM 100 MG PO CAPS
100.0000 mg | ORAL_CAPSULE | Freq: Two times a day (BID) | ORAL | Status: DC
  Filled 2017-12-05: qty 1

## 2017-12-05 MED ORDER — POLYETHYLENE GLYCOL 3350 17 G PO PACK
17.0000 g | PACK | Freq: Every day | ORAL | Status: DC
  Filled 2017-12-05: qty 1

## 2017-12-05 MED ORDER — LORATADINE 10 MG PO TABS
10.0000 mg | ORAL_TABLET | Freq: Every day | ORAL | Status: DC
  Administered 2017-12-05 – 2017-12-07 (×3): 10 mg via ORAL
  Filled 2017-12-05 (×3): qty 1

## 2017-12-05 MED ORDER — POLYVINYL ALCOHOL 1.4 % OP SOLN
1.0000 [drp] | Freq: Every day | OPHTHALMIC | Status: DC | PRN
  Filled 2017-12-05: qty 15

## 2017-12-05 MED ORDER — DILTIAZEM HCL ER COATED BEADS 240 MG PO CP24
240.0000 mg | ORAL_CAPSULE | Freq: Every day | ORAL | Status: DC
  Administered 2017-12-05 – 2017-12-07 (×3): 240 mg via ORAL
  Filled 2017-12-05 (×3): qty 1

## 2017-12-05 MED ORDER — PROSIGHT PO TABS
1.0000 | ORAL_TABLET | Freq: Every day | ORAL | Status: DC
  Administered 2017-12-05 – 2017-12-07 (×3): 1 via ORAL
  Filled 2017-12-05 (×3): qty 1

## 2017-12-05 MED ORDER — PANTOPRAZOLE SODIUM 40 MG PO TBEC
40.0000 mg | DELAYED_RELEASE_TABLET | Freq: Every day | ORAL | Status: DC
  Administered 2017-12-05 – 2017-12-07 (×3): 40 mg via ORAL
  Filled 2017-12-05 (×3): qty 1

## 2017-12-05 MED ORDER — OXYCODONE HCL 5 MG PO TABS: 5.0000 mg | ORAL_TABLET | Freq: Four times a day (QID) | ORAL | Status: DC | PRN

## 2017-12-05 MED ORDER — ESCITALOPRAM OXALATE 10 MG PO TABS
10.0000 mg | ORAL_TABLET | Freq: Every day | ORAL | Status: DC
  Administered 2017-12-05 – 2017-12-07 (×3): 10 mg via ORAL
  Filled 2017-12-05 (×3): qty 1

## 2017-12-05 NOTE — Progress Notes (Signed)
Report received from S. Adamson, RN. No change from initial pm assessment. Will continue to monitor and follow the POC.  

## 2017-12-05 NOTE — Progress Notes (Signed)
PROGRESS NOTE    Mary [name]  RUE:[id number] DOB: 02/07/1933 DOA: 12/01/2017 PCP: Moshe Cipro, MD   Brief Narrative:  The patient is an 84 y.o.femalewith medical history significant of Metastatic sigmoid colon Adenocarcinomawith colonic strictures status post stenting by [facility],metastases to the liver, metastasis to the lungs, left renal and left adnexal masses left hydronephrosis, history of paroxysmal atrial fibrillation, HTN, HLD and other comorbids who presented with1 week of constipation unable to pass any stool now unable to pass any flatus either she has tried to use MiraLAX and doubled her dose but no relief. Was diagnosed with a likely colonic stent failureandhypokalemia which was replete.   GI consulted and Patient had sigmoidoscopy yesterday and found to have a malignant appearing. intrinsic severe stenosis measuring 4 mm in the distal sigmoid colon and had a stent was not reversed.  Dr. May got stented this with a wall flex stent under fluoroscopic guidance without complications.  Patient was placed on a clear liquid diet and her diet has not been advanced.  She continues to have bowel movements so her MiraLAX was decreased. Will obtain Physical therapy and occupational therapy evaluations.   Assessment & Plan:   Active Problems:   Cancer of sigmoid colon ([place])   Persistent atrial fibrillation (HCC)   Essential hypertension   Hyperlipidemia   Bowel obstruction (HCC)   Colon stricture (HCC)   Colon obstruction (HCC)   Generalized abdominal pain   Palliative care by specialist  Colonic Obstruction likely 2/2 due to Tumor S/p distal sigmoid colon stenting in the malignant appearing, intrinsic severe stenosis  -CT scan shows obstruction at the level of the stent in the sigmoid colon.  -Gastroenterology was consulted and feel that her abdominal pain and distention is secondary to the colonic obstruction -Patient has history of a stricture involving the rectosigmoid  junction which is status post stent placement in December 2018 -Barium enema 12/02/17 showed partial obstruction of the level of the previously placed stent -She underwent flexible sigmoidoscopy by Dr. Watt Climes on 12/04/17 she was found to have a malignant appearing, intrinsic severe stenosis in the distal sigmoid colon inside the stent.  This was stented with a 22 mm x 9 cm wall flex stent under fluoroscopic guidance. -Was placed on a clear liquid diet yesterday and had her diet advanced to Soft Diet. -MiraLAX 3 times daily decreased to 17 g p.o. daily given her continued bowel movements. *Added back to her docusate 100 mg p.o. twice daily -C/w Metaclopamide 5 mg IV every 6 as needed for nausea -Pain control with Hydromorphone 0.5-1 mg every 4 hours as needed for severe pain; I have added her home pain regimen with Oxycodone 5 mg every 6 as needed for moderate pain. -I have instructed the nurse to try the patient's home regimen as she is now advancing her diet and if necessary then use the IV Dilaudid -PT/OT to Evaluate and Treat   Metastatic Sigmoid Colon Cancer -Followed by Oncology Dr. Burr Medico as an outpatient -Has metastasis to the liver and lungs -On palliative chemotherapy with Xeloda and Vectibix -Chemotherapy being held currently and per Oncology if she recovers well from obstruction will continue chemo as an option -IF Bowel obstruction is not able to be resolved and hospice would be reasonable and palliative has been consulted for evaluation -Palliative medicine team following and patient now DNR -CEA was 3.06 on 4/19 -Resume Home Pain control now that obstruction appears to have improved after stenting   Essential Hypertension -Antihypertensives with  Cardizem restarted; -Amlodipine and Losartan are being held currently but can be resumed in AM  -Blood pressure was 131/85 today  -We will add IV Hydralazine 10 mg q6hprn for SBP>160 or DBP>100  Hypokalemia, improved  -Patient's potassium  level this morning was 3.9 -Continue to monitor and replete as necessary -Repeat CMP in a.m.  Hyperlipidemia -Stable. -Upon further review patient is not on a Statin at home  Persistent Atrial Fibrillation -CHA2DS2VASc Score is 3 but not on anticoagulation due to risk of bleeding.   -Heart rate controlled with Cardizem at home but since was NPO was getting 2.5 mg of IV metoprolol tartrate q8h. -IV Metoprolol has now been discontinued and her home dose of Cardizem 240 mg is been reinstated. -Continue with telemetry monitoring  Thrombocytosis, improve  -Was Trending Upwards but now improved -Patient's platelet count went from 386 -> 436 -> 351 -Continue to monitor and repeat CBC in a.m.  Hypophosphatemia -Patient's Phos level was 2.4 yesterday and this AM was 3.0 -Continue to monitor and repeat Phos Level in AM and Replete as Necessary  Depression -Resume Escitalopram 10 mg p.o. daily is currently being held until she is able to take po  Seasonal Allergies/Allergic Rhinitis -Resume Fexofenadine 180 mg po Daily with Loratadine 10 mg po Pharmacy Substitution   GERD -Resume Omeprazole 40 mg p.o. daily with Pantoprazole 40 mg po Daily Pharmacy Substitution   Glaucoma -Continue with Latanoprost 0.005% ophthalmic solution 1 drop in both eyes at bedtime along with Liquifirm Tears  DVT prophylaxis: Enoxaparin 40 mg subcu daily at bedtime Code Status: DO NOT RESUSCITATE Family Communication: Discussed with Family at bedside  Disposition Plan: [facility] for continued workup and Sigmoidoscopy this AM   Consultants:   Gastroenterology [facility])  Medical Oncology  Palliative Care Medicine   Procedures:  Flexible Sigmoidoscopy Findings:      A Wallstent was found in the recto-sigmoid colon.      A malignant-appearing, intrinsic severe stenosis measuring 4 mm (inner       diameter) was found in the distal sigmoid colon inside the stent and was       non-traversed. This was  stented with a 22 mm x 9 cm WallFlex stent under       fluoroscopic guidance. We first advanced the balloon ERCP catheter with       the JAG long wire through it and under fluoroscopy guidance using dye to       confirm we were in the colonic lumen we advanced the wire and under       fluoroscopy guidance and endoscopic guidance remove the balloon catheter       and placed in the stent in the customary fashion under both fluoroscopy       and endoscopic guidance seemingly in good position with the waist in the       middle of the stent and she was passing air and lots of stool which made       further visualization difficult      The exam was otherwise normal throughout the examined colon. Impression:               - Preparation of the colon was fair.                           - Stent in the colon.                           -  Stricture in the distal sigmoid colon. Prosthesis                            placed.                           - No specimens collected.   Antimicrobials:  Anti-infectives (From admission, onward)   None     Subjective: Patient was seen and examined this and states abdominal discomfort has much improved and she is having several bowel movements.  No chest pain, shortness breath, nausea, vomiting.  States that she does have some abdominal tenderness walking when she gets up and starts moving. Daughter in law is frustrated that her pain is not under control however I explained to her that opiates would slow down her bowel gut function and that she would not go on any new narcotic drugs at home and daughter-in-law was not happy.  Patient had no other concerns or complaints at this time.  Objective: Vitals:   12/04/17 1335 12/04/17 2033 12/05/17 0521 12/05/17 1349  BP: (!) 157/96 130/73 (!) 146/92 131/85  Pulse: 95 90 91 (!) 108  Resp: 16 18 18 16   Temp: 98 F (36.7 C) 98 F (36.7 C) 97.8 F (36.6 C) 98.1 F (36.7 C)  TempSrc: Oral Oral Oral Oral  SpO2: 93% 95%  92% 97%  Weight:      Height:        Intake/Output Summary (Last 24 hours) at 12/05/2017 1443 Last data filed at 12/05/2017 1300 Gross per 24 hour  Intake 720 ml  Output -  Net 720 ml   Filed Weights   12/01/17 2025 12/04/17 1043  Weight: 80.3 kg (177 lb) 80.3 kg (177 lb)   Examination: Physical Exam:  Constitutional: Well-nourished, well-developed Caucasian female currently no acute distress appears calm and appears more comfortable today she is complaining of significant bowel movements Eyes: Anicteric.  Lids and conjunctive appear normal ENMT: External ears and nose appear normal.  Grossly normal hearing.  Mucous membranes appear moist Neck: Supple with no JVD Respiratory: Diminished to auscultation bilaterally with no appreciable wheezing, rales, rhonchi.  Unlabored breathing and patient has a normal respiratory effort. Cardiovascular: Irregularly irregular and slightly tachycardic.  No appreciable murmurs, rubs, gallops.  No lower extermity edema  Abdomen: Soft, less distended than yesterday.  Mildly tender to palpate.  Bowel sounds present GU: Deferred Musculoskeletal:  Skin: Warm and dry with no appreciable rashes or lesions on a limited skin evaluation.  No contractures, no cyanosis.  Patient has no joint deformities noted Neurologic: Cranial nerves II through XII grossly intact with no appreciable focal deficit Psychiatric: Normal mood and affect.  Intact during the entirety awake alert and oriented x3  Data Reviewed: I have personally reviewed following labs and imaging studies  CBC: Recent Labs  Lab 12/01/17 1349 12/02/17 0445 12/03/17 0525 12/04/17 0448 12/05/17 0516  WBC 6.6 5.9 5.7 5.8 6.0  NEUTROABS 3.5  --   --  3.2 3.1  HGB 12.4 11.6* 12.2 12.8 12.3  HCT 36.9 36.0 38.3 39.3 37.6  MCV 94.9 96.5 96.7 95.6 94.9  PLT 402* 386 430* 436* 355   Basic Metabolic Panel: Recent Labs  Lab 12/01/17 1349 12/02/17 0445 12/03/17 0525 12/04/17 0448 12/05/17 0516    NA 137 139 139 138 139  K 2.9* 3.2* 3.3* 3.5 3.9  CL 102 110 106 103 104  CO2 26  20* 24 26 25   GLUCOSE 124* 95 100* 102* 90  BUN 14 11 12 10 6   CREATININE 0.69 0.68 0.63 0.58 0.62  CALCIUM 9.2 8.3* 9.1 8.8* 8.5*  MG 1.7 1.8  --  1.8 1.7  PHOS  --  2.4*  --  3.1 3.0   GFR: Estimated Creatinine Clearance: 57.9 mL/min (by C-G formula based on SCr of 0.62 mg/dL). Liver Function Tests: Recent Labs  Lab 12/01/17 1349 12/02/17 0445 12/04/17 0448 12/05/17 0516  AST 24 25 20 29   ALT 32 30 27 21   ALKPHOS 103 94 88 76  BILITOT 0.7 0.6 0.8 0.7  PROT 7.0 6.2* 6.3* 5.8*  ALBUMIN 3.3* 3.0* 3.0* 2.6*   Recent Labs  Lab 12/01/17 1349  LIPASE 31   No results for input(s): AMMONIA in the last 168 hours. Coagulation Profile: No results for input(s): INR, PROTIME in the last 168 hours. Cardiac Enzymes: No results for input(s): CKTOTAL, CKMB, CKMBINDEX, TROPONINI in the last 168 hours. BNP (last 3 results) No results for input(s): PROBNP in the last 8760 hours. HbA1C: No results for input(s): HGBA1C in the last 72 hours. CBG: No results for input(s): GLUCAP in the last 168 hours. Lipid Profile: No results for input(s): CHOL, HDL, LDLCALC, TRIG, CHOLHDL, LDLDIRECT in the last 72 hours. Thyroid Function Tests: No results for input(s): TSH, T4TOTAL, FREET4, T3FREE, THYROIDAB in the last 72 hours. Anemia Panel: No results for input(s): VITAMINB12, FOLATE, FERRITIN, TIBC, IRON, RETICCTPCT in the last 72 hours. Sepsis Labs: No results for input(s): PROCALCITON, LATICACIDVEN in the last 168 hours.  Recent Results (from the past 240 hour(s))  MRSA PCR Screening     Status: Abnormal   Collection Time: 12/04/17  7:18 AM  Result Value Ref Range Status   MRSA by PCR POSITIVE (A) NEGATIVE Final    Comment:        The GeneXpert MRSA Assay (FDA approved for NASAL specimens only), is one component of a comprehensive MRSA colonization surveillance program. It is not intended to diagnose  MRSA infection nor to guide or monitor treatment for MRSA infections. RESULT CALLED TO, READ BACK BY AND VERIFIED WITH: Lise Auer [id number] @ [facility] Performed at [facility] [address], [place], [place] [address]      Radiology Studies: Dg Abd 1 View - Kub  Result Date: 12/04/2017 CLINICAL DATA:  Small bowel obstruction EXAM: ABDOMEN - 1 VIEW COMPARISON:  PE 12/02/2017, CT 12/01/2017 FINDINGS: Four low resolution intraoperative spot views of the pelvis obtained in the lateral projection. Total fluoroscopy time was 5 minutes 15 seconds. The images demonstrate catheter an or wire access across a colon stent. There is air-filled dilated colon in the pelvis. IMPRESSION: Intraoperative fluoroscopic assistance provided during colonic stent placement. Electronically Signed   By: Donavan Foil M.D.   On: 12/04/2017 17:52   Dg C-arm 1-60 Min-no Report  Result Date: 12/04/2017 Fluoroscopy was utilized by the requesting physician.  No radiographic interpretation.   Scheduled Meds: . Chlorhexidine Gluconate Cloth  6 each Topical Q0600  . enoxaparin (LOVENOX) injection  40 mg Subcutaneous QHS  . latanoprost  1 drop Both Eyes QHS  . metoprolol tartrate  2.5 mg Intravenous Q8H  . mupirocin ointment  1 application Nasal BID  . [START ON 12/06/2017] polyethylene glycol  17 g Oral Daily  . sodium phosphate  1 enema Rectal Once  . thiamine  100 mg Intravenous Daily   Continuous Infusions:   LOS: 4  days   Kerney Elbe, DO [facility] Pager [phone]  If 7PM-7AM, please contact night-coverage [email or web address] Password [facility] 12/05/2017, 2:43 PM

## 2017-12-05 NOTE — Progress Notes (Signed)
   12/05/17 1547  Clinical Encounter Type  Visited With Patient and family together  Visit Type Initial;Spiritual support  Spiritual Encounters  Spiritual Needs Prayer   Rounding on Palliative Patients.  Patient was sitting up on the side of the bed and daughter in law present.  Patient is hoping to go home tomorrow states she is feeling well.  Shared some about her journey with cancer and is appreciative that she feels ok now.  Has good support system with family and her faith community.  Will follow and support as needed. Chaplain Katherene Ponto

## 2017-12-05 NOTE — Progress Notes (Signed)
Mary [name] 10:40 AM  Subjective: The patient is doing much better and had no obvious problem from her procedure and her case discussed with the nurse and her daughter-in-law and she is tolerating clear liquids and has less pain and is moving her bowels too much  Objective: Vital signs stable afebrile no acute distress abdomen is soft occasional bowel sounds nontender no guarding or rebound labs okay  Assessment: Metastatic colon cancer with obstructed stent status post restenting  Plan: Will decrease MiraLAX and allow soft solids and call my partner this weekend if any question or problem and I'm happy to see back as an outpatient and again the warnings of stent obstruction or perforation was rediscussed with the patient and her family and again we discussed surgical options for palliation  [facility]  Pager [phone] After 5PM or if no answer call [phone]

## 2017-12-05 NOTE — Care Management Note (Signed)
Case Management Note  Patient Details  Name: Mary [name] MRN: [id number] Date of Birth: 02/07/1933  Subjective/Objective: PT cons-await recc. Patient has used [facility] in past-she would like to use again if needed.[facility]([place] office)-Sally aware of PT cons in am. I have faxed demographic sheet w/confirmation to [phone]-they can start service on Monday If [place] needed. Will need HHC orders, & face to face if [place] needed.                  Action/Plan:d/c home w/HHC.   Expected Discharge Date:  (unknown)               Expected Discharge Plan:  [place]  In-House Referral:     Discharge planning Services  CM Consult  Post Acute Care Choice:  (Has rw) Choice offered to:  Patient  DME Arranged:    DME Agency:     HH Arranged:    [place] Agency:     Status of Service:  In process, will continue to follow  If discussed at Long Length of Stay Meetings, dates discussed:    Additional Comments:  Dessa Phi, RN 12/05/2017, 3:19 PM

## 2017-12-06 LAB — CBC WITH DIFFERENTIAL/PLATELET
BASOS ABS: 0 10*3/uL (ref 0.0–0.1)
BASOS PCT: 0 %
EOS PCT: 2 %
Eosinophils Absolute: 0.1 10*3/uL (ref 0.0–0.7)
HEMATOCRIT: 37.7 % (ref 36.0–46.0)
Hemoglobin: 12.2 g/dL (ref 12.0–15.0)
Lymphocytes Relative: 19 %
Lymphs Abs: 1.4 10*3/uL (ref 0.7–4.0)
MCH: 31.1 pg (ref 26.0–34.0)
MCHC: 32.4 g/dL (ref 30.0–36.0)
MCV: 96.2 fL (ref 78.0–100.0)
MONO ABS: 1.3 10*3/uL — AB (ref 0.1–1.0)
Monocytes Relative: 17 %
NEUTROS ABS: 4.6 10*3/uL (ref 1.7–7.7)
Neutrophils Relative %: 62 %
PLATELETS: 416 10*3/uL — AB (ref 150–400)
RBC: 3.92 MIL/uL (ref 3.87–5.11)
RDW: 17.1 % — AB (ref 11.5–15.5)
WBC: 7.4 10*3/uL (ref 4.0–10.5)

## 2017-12-06 LAB — COMPREHENSIVE METABOLIC PANEL
ALBUMIN: 2.7 g/dL — AB (ref 3.5–5.0)
ALT: 18 U/L (ref 14–54)
AST: 19 U/L (ref 15–41)
Alkaline Phosphatase: 75 U/L (ref 38–126)
Anion gap: 9 (ref 5–15)
CHLORIDE: 108 mmol/L (ref 101–111)
CO2: 26 mmol/L (ref 22–32)
Calcium: 8.5 mg/dL — ABNORMAL LOW (ref 8.9–10.3)
Creatinine, Ser: 0.76 mg/dL (ref 0.44–1.00)
GFR calc Af Amer: >60 mL/min (ref >60)
GFR calc non Af Amer: >60 mL/min (ref >60)
GLUCOSE: 124 mg/dL — AB (ref 65–99)
Potassium: 2.7 mmol/L — CL (ref 3.5–5.1)
Sodium: 143 mmol/L (ref 135–145)
Total Bilirubin: 0.5 mg/dL (ref 0.3–1.2)
Total Protein: 6 g/dL — ABNORMAL LOW (ref 6.5–8.1)

## 2017-12-06 LAB — PHOSPHORUS: Phosphorus: 3.4 mg/dL (ref 2.5–4.6)

## 2017-12-06 LAB — MAGNESIUM: Magnesium: 1.6 mg/dL — ABNORMAL LOW (ref 1.7–2.4)

## 2017-12-06 MED ORDER — MAGNESIUM SULFATE 2 GM/50ML IV SOLN
2.0000 g | Freq: Once | INTRAVENOUS | Status: AC
Start: 2017-12-06 — End: 2017-12-06
  Administered 2017-12-06: 2 g via INTRAVENOUS
  Filled 2017-12-06: qty 50

## 2017-12-06 MED ORDER — POTASSIUM CHLORIDE 10 MEQ/100ML IV SOLN
10.0000 meq | INTRAVENOUS | Status: AC
  Administered 2017-12-06 (×3): 10 meq via INTRAVENOUS
  Filled 2017-12-06 (×3): qty 100

## 2017-12-06 MED ORDER — POTASSIUM CHLORIDE CRYS ER 20 MEQ PO TBCR
40.0000 meq | EXTENDED_RELEASE_TABLET | Freq: Two times a day (BID) | ORAL | Status: DC
  Administered 2017-12-06 – 2017-12-07 (×3): 40 meq via ORAL
  Filled 2017-12-06 (×3): qty 2

## 2017-12-06 MED ORDER — VITAMIN B-1 100 MG PO TABS
100.0000 mg | ORAL_TABLET | Freq: Every day | ORAL | Status: DC
  Administered 2017-12-06 – 2017-12-07 (×2): 100 mg via ORAL
  Filled 2017-12-06 (×2): qty 1

## 2017-12-06 NOTE — Evaluation (Signed)
Physical Therapy Evaluation Patient Details Name: Mary [name] MRN: [id number] DOB: 02/07/1933 Today's Date: 12/06/2017   History of Present Illness  84yo female with 1 week history of not being able to pass stool or flatus, abdominal distension, and nausea/vomiting. Diagnosed with colon obstruction possibly secondary to colonic stent failure. PMH AAA, hx colon and kidney cancer with lung and liver mets, HTN, HOH, blind R eye, macular degeneration, hx colonic stent placement, A-fib   Clinical Impression   Patient asleep, but easily woken and willing to participate in PT today; daughter was present during session and assisted in providing PLOF and equipment history. Mary [name] is able to perform functional bed mobility, transfers, and gait approximately 374f with no device and S without difficulty, but does demonstrate mild functional strength and balance deficits this session. She was left in bed with all needs met, daughter present. She will continue to benefit from skilled PT services in the acute setting as well as skilled HHPT services moving forward to further address functional deficits and reduce fall risk.     Follow Up Recommendations Home health PT    Equipment Recommendations  None recommended by PT(appears to have all necessary DME )    Recommendations for Other Services       Precautions / Restrictions Precautions Precautions: None Restrictions Weight Bearing Restrictions: No      Mobility  Bed Mobility Overal bed mobility: Modified Independent                Transfers Overall transfer level: Modified independent   Transfers: Sit to/from Stand Sit to Stand: Modified independent (Device/Increase time)            Ambulation/Gait Ambulation/Gait assistance: Supervision Ambulation Distance (Feet): 300 Feet Assistive device: None Gait Pattern/deviations: Step-through pattern;Decreased step length - right;Decreased step length - left;Decreased stride  length;Drifts right/left;Narrow base of support     General Gait Details: mild unsteadiness with gait with no device, narrow BOS; able to correct without additional support from PT today   Stairs            Wheelchair Mobility    Modified Rankin (Stroke Patients Only)       Balance Overall balance assessment: Mild deficits observed, not formally tested                                           Pertinent Vitals/Pain Pain Assessment: No/denies pain    Home Living Family/patient expects to be discharged to:: Private residence Living Arrangements: Alone Available Help at Discharge: Family;Available PRN/intermittently Type of Home: House Home Access: Stairs to enter Entrance Stairs-Rails: None Entrance Stairs-Number of Steps: 1 step  Home Layout: One level Home Equipment: Walker - 2 wheels;Wheelchair - manual Additional Comments: pt has accessible home due to hx of her spouse (now deceased) having CVA and w/c bound    Prior Function Level of Independence: Independent         Comments: is blind in right eye, macular degen     Hand Dominance        Extremity/Trunk Assessment        Lower Extremity Assessment Lower Extremity Assessment: Generalized weakness    Cervical / Trunk Assessment Cervical / Trunk Assessment: Normal  Communication   Communication: HOH  Cognition Arousal/Alertness: Awake/alert Behavior During Therapy: WFL for tasks assessed/performed Overall Cognitive Status: Within Functional Limits for tasks assessed  General Comments      Exercises     Assessment/Plan    PT Assessment Patient needs continued PT services  PT Problem List Decreased strength;Decreased safety awareness;Decreased balance;Decreased coordination       PT Treatment Interventions DME instruction;Therapeutic activities;Gait training;Therapeutic exercise;Patient/family education;Stair  training;Balance training;Functional mobility training;Neuromuscular re-education    PT Goals (Current goals can be found in the Care Plan section)  Acute Rehab PT Goals Patient Stated Goal: to go home  PT Goal Formulation: With patient/family Time For Goal Achievement: 12/20/17 Potential to Achieve Goals: Good    Frequency Min 3X/week   Barriers to discharge        Co-evaluation               AM-PAC PT "6 Clicks" Daily Activity  Outcome Measure Difficulty turning over in bed (including adjusting bedclothes, sheets and blankets)?: None Difficulty moving from lying on back to sitting on the side of the bed? : None Difficulty sitting down on and standing up from a chair with arms (e.g., wheelchair, bedside commode, etc,.)?: None Help needed moving to and from a bed to chair (including a wheelchair)?: None Help needed walking in hospital room?: A Little Help needed climbing 3-5 steps with a railing? : A Little 6 Click Score: 22    End of Session   Activity Tolerance: Patient tolerated treatment well Patient left: in bed;with family/visitor present;with call bell/phone within reach   PT Visit Diagnosis: Muscle weakness (generalized) (M62.81);Unsteadiness on feet (R26.81)    Time: 9702-6378 PT Time Calculation (min) (ACUTE ONLY): 20 min   Charges:   PT Evaluation $PT Eval Low Complexity: 1 Low     PT G Codes:        Deniece Ree PT, DPT, CBIS  Supplemental Physical Therapist [place]   Pager [phone]

## 2017-12-06 NOTE — Progress Notes (Signed)
PHARMACIST - PHYSICIAN COMMUNICATION  DR:   TRH  CONCERNING: IV to Oral Route Change Policy  RECOMMENDATION: This patient is receiving thiamine by the intravenous route.  Based on criteria approved by the Pharmacy and Therapeutics Committee, the intravenous medication(s) is/are being converted to the equivalent oral dose form(s).   DESCRIPTION: These criteria include:  The patient is eating (either orally or via tube) and/or has been taking other orally administered medications for a least 24 hours  The patient has no evidence of active gastrointestinal bleeding or impaired GI absorption (gastrectomy, short bowel, patient on TNA or NPO).  If you have questions about this conversion, please contact the Pharmacy Department  []   [phone] )  [facility] []   [phone] )  [facility] []   [phone] )  [facility] []   [phone] )  [facility] [x]   [phone] )  [place], PharmD, BCPS.   12/06/2017 9:20 AM

## 2017-12-06 NOTE — Discharge Summary (Signed)
Physician Discharge Summary  Mary [name] CHE:[id number] DOB: 02/07/1933 DOA: 12/01/2017  PCP: Moshe Cipro, MD  Admit date: 12/01/2017 Discharge date: 12/07/2017  Admitted From: Home Disposition: Home with [facility] PT  Recommendations for Outpatient Follow-up:  1. Follow up with PCP in 1-2 weeks 2. Of gastroenterology Dr. Clarene Essex in 1 to 2 weeks for outpatient evaluation 3. Follow-up with Medical Oncology Dr. Burr Medico as an outpatient for further discussion about chemotherapy 4. Palliative Care follow as an outpatient 5. Please obtain CMP/CBC, Mag, Phos in one week 6. Please follow up on the following pending results:  Home Health: Yes Equipment/Devices: None recommended by physical therapy as she appears to have all the necessary DME  Discharge Condition: Stable CODE STATUS: DO NOT RESUSCITATE Diet recommendation: Soft Diet  Brief/Interim Summary: The patient is an 84 y.o.femalewith medical history significant of Metastatic sigmoid colon Adenocarcinomawith colonic strictures status post stenting by [facility],metastases to the liver, metastasis to the lungs, left renal and left adnexal masses left hydronephrosis, history of paroxysmal atrial fibrillation, HTN, HLD and other comorbids who presented with1 week of constipation unable to pass any stool now unable to pass any flatus either she has tried to use MiraLAX and doubled her dose but no relief. Was diagnosed with a likely colonic stent failureandhypokalemia which was replete.   GI consulted and Patient had sigmoidoscopy and found to have a malignant appearing. intrinsic severe stenosis measuring 4 mm in the distal sigmoid colon. Dr. Watt Climes stented this with a wall flex stent under fluoroscopic guidance without complications.  Patient was placed on a clear liquid diet and her diet has now been advanced to Soft.  She continues to have bowel movements so her MiraLAX was decreased. PT/OT evaluated and recommended Home Health  PT.  Patient had several bowel movements overnight 4/26/-4/27 and this yesterday her blood work showed that she had electrolyte abnormalities so they were replete.  The patient's electrolytes are improved this morning she will be discharged home with home health.  She is to follow-up with Gastroenterology, Oncology, as well as Primary Care Physician at discharge.  Discharge Diagnoses:  Active Problems:   Cancer of sigmoid colon ([facility])   Persistent atrial fibrillation (HCC)   Essential hypertension   Hyperlipidemia   Bowel obstruction (HCC)   Colon stricture (HCC)   Colon obstruction (HCC)   Generalized abdominal pain   Palliative care by specialist  Colonic Obstruction likely 2/2 due to Tumor S/p distal sigmoid colon stenting in the malignant appearing, intrinsic severe stenosis  -CT scan showed obstruction at the level of the stent in the sigmoid colon.  -Gastroenterology was consulted and feel that her abdominal pain and distention is secondary to the colonic obstruction -Patient has history of a stricture involving the rectosigmoid junction which is status post stent placement in December 2018 -Barium enema 12/02/17 showed partial obstruction of the level of the previously placed stent -She underwent flexible sigmoidoscopy by Dr. Watt Climes on 12/04/17 she was found to have a malignant appearing, intrinsic severe stenosis in the distal sigmoid colon inside the stent. This was stented with a 22 mm x 9 cm wall flex stent under fluoroscopic guidance. -Was placed on a clear liquid diet and had her diet advanced to Soft Diet is tolerating that well -MiraLAX 3 times daily decreased to 17 g p.o. daily given her continued bowel movements. *Added back to her docusate 100 mg p.o. twice daily however patient continues to have several bowel movements so stopped the docusate. -C/w Metaclopamide  5 mg IV every 6 as needed for nausea while hospitalized -Pain control with Hydromorphone 0.5-1 mg every 4 hours as  needed for severe pain while hospitalized -I have added her home pain regimen with Oxycodone 5 mg every 6 as needed for moderate pain she will continue this at discharge -PT/OT to Evaluate and Treat recommending home health -Patient continued to have bowel movement yesterday and because of it had electrolyte abnormalities; these were repleted and patient improved and was deemed medically stable to be discharged home  Metastatic Sigmoid Colon Cancer -Followed by Oncology Dr. Burr Medico as an outpatient and will need to follow-up at discharge -Has metastasis to the liver and lungs -On palliative chemotherapy with Xeloda and Vectibix -Chemotherapy held currently and per Oncology if she recovers well from obstruction will continue chemo as an option we will need to discuss restarting chemotherapy with Dr. Burr Medico -Palliative medicine team following and patient now DNR will have palliative fall at discharge -CEA was 3.06 on 4/19 -Resume Home Pain control now that obstruction appears to have improved after stenting   Essential Hypertension -Antihypertensives with Cardizem restarted; -Amlodipine and Losartan can be resumed at discharge -Blood pressure was 146/99 today  -We will add IV Hydralazine 10 mg q6hprn for SBP>160 or DBP>100 while hospitalized  Hypokalemia -Patient's potassium level yesterday morning was 2.7 and improved to 3.9 -Low K+ likely a resultant of the patient's multiple bowel movements yesterday and this morning -Replete with p.o. 40 mEq KCl twice daily as well as 30 mEq of IV KCl yesterday  -Continue to monitor and replete as necessary -Repeat CMP as an outpatient with PCP   Hypomagnesemia -Patient's magnesium level exam was 1.6 likely from multiple bowel movements and improved to 1.9 -Replete with IV mag sulfate 2 g yesterday -Continue to monitor and replete as necessary -Repeat magnesium level in a.m.  Hyperlipidemia -Stable. -Upon further review patient is not on a Statin  at home  Persistent Atrial Fibrillation -CHA2DS2VAScScore is 3 but not on anticoagulationdue to risk of bleeding. -Heart rate controlled with Cardizem at home but since was NPO was getting 2.5 mg of IV metoprolol tartrate q8h. -IV Metoprolol has now been discontinued and her home dose of Cardizem 240 mg is been reinstated. -Continue with telemetry monitoring  Thrombocytosis -Fluctuating -Patient's platelet count went from 386 -> 436 -> 351 -> 416 -> 422 -Continue to monitor and repeat CBC as an outpatient with PCP  Hypophosphatemia -Patient's Phos Level is now 3.5 -Continue to monitor and repeat Phos Level as an outpatient  Depression -Resumed Escitalopram 10 mg p.o. daily   Seasonal Allergies/Allergic Rhinitis -Resume Fexofenadine 180 mg po Daily   GERD -Resume Omeprazole 40 mg p.o. daily   Glaucoma -Continue with Latanoprost 0.005% ophthalmic solution 1 drop in both eyes at bedtime along with Systane   Discharge Instructions Discharge Instructions    Call MD for:  difficulty breathing, headache or visual disturbances   Complete by:  As directed    Call MD for:  extreme fatigue   Complete by:  As directed    Call MD for:  hives   Complete by:  As directed    Call MD for:  persistant dizziness or light-headedness   Complete by:  As directed    Call MD for:  persistant nausea and vomiting   Complete by:  As directed    Call MD for:  redness, tenderness, or signs of infection (pain, swelling, redness, odor or green/yellow discharge around incision site)   Complete  by:  As directed    Call MD for:  severe uncontrolled pain   Complete by:  As directed    Call MD for:  temperature >100.4   Complete by:  As directed    Diet - low sodium heart healthy   Complete by:  As directed    Discharge instructions   Complete by:  As directed    Follow-up with primary care physician, oncologist Dr. Burr Medico, as well as gastroenterologist Dr. Watt Climes.  Take all medications as  prescribed.  If symptoms change or worsen please return to the emergency room for evaluation.   Increase activity slowly   Complete by:  As directed      Allergies as of 12/07/2017      Reactions   Benazepril    COUGH   Tylenol [acetaminophen]    Anxiety   Bactrim [sulfamethoxazole-trimethoprim] Nausea And Vomiting      Medication List    STOP taking these medications   clindamycin 1 % gel Commonly known as:  CLINDAGEL   docusate sodium 100 MG capsule Commonly known as:  COLACE   lactulose 20 g packet Commonly known as:  CEPHULAC   metoprolol tartrate 25 MG tablet Commonly known as:  LOPRESSOR     TAKE these medications   amLODipine 5 MG tablet Commonly known as:  NORVASC Take 1 tablet (5 mg total) by mouth daily.   capecitabine 500 MG tablet Commonly known as:  XELODA Take 4 tablets ( 2000 mg ) in  AM ;  3 tablets ( 1500 mg ) in  PM for  7 days on ,  Off 7 days.   diltiazem 240 MG 24 hr capsule Commonly known as:  CARDIZEM CD Take 1 capsule (240 mg total) by mouth daily.   DULCOLAX PO Take 2 tablets by mouth 2 (two) times daily.   bisacodyl 10 MG suppository Commonly known as:  DULCOLAX Place 10 mg rectally as needed for moderate constipation.   escitalopram 10 MG tablet Commonly known as:  LEXAPRO Take 10 mg by mouth daily.   fexofenadine 180 MG tablet Commonly known as:  ALLEGRA Take 180 mg by mouth daily.   furosemide 40 MG tablet Commonly known as:  LASIX Take 1 tablet (40 mg total) by mouth daily.   latanoprost 0.005 % ophthalmic solution Commonly known as:  XALATAN Place 1 drop into both eyes at bedtime.   losartan 100 MG tablet Commonly known as:  COZAAR Take 1 tablet (100 mg total) by mouth daily.   omeprazole 40 MG capsule Commonly known as:  PRILOSEC Take 40 mg by mouth daily.   ondansetron 4 MG disintegrating tablet Commonly known as:  ZOFRAN ODT Take 1 tablet (4 mg total) by mouth every 8 (eight) hours as needed for nausea or  vomiting.   oxyCODONE 5 MG immediate release tablet Commonly known as:  Oxy IR/ROXICODONE Take 1 tablet (5 mg total) by mouth every 6 (six) hours as needed for moderate pain or severe pain.   polyethylene glycol packet Commonly known as:  MIRALAX / GLYCOLAX Take 17 g by mouth daily as needed for moderate constipation.   Potassium Chloride ER 20 MEQ Tbcr Take 20 mEq by mouth daily.   sennosides-docusate sodium 8.6-50 MG tablet Commonly known as:  SENOKOT-S Take 1-2 tablets by mouth 3 (three) times daily.   SYSTANE 0.4-0.3 % Soln Generic drug:  Polyethyl Glycol-Propyl Glycol Apply 1 drop to eye daily as needed (dry eyes).   urea 10 % cream  Commonly known as:  CARMOL Apply topically as needed.   VITEYES AREDS ADVANCED Caps Take 1 capsule by mouth daily.       Allergies  Allergen Reactions  . Benazepril     COUGH   . Tylenol [Acetaminophen]     Anxiety   . Bactrim [Sulfamethoxazole-Trimethoprim] Nausea And Vomiting   Consultations:  Gastroenterology  Medical Oncology  Palliative Care Medicine   Procedures/Studies: Dg Abd 1 View - Kub  Result Date: 12/04/2017 CLINICAL DATA:  Small bowel obstruction EXAM: ABDOMEN - 1 VIEW COMPARISON:  PE 12/02/2017, CT 12/01/2017 FINDINGS: Four low resolution intraoperative spot views of the pelvis obtained in the lateral projection. Total fluoroscopy time was 5 minutes 15 seconds. The images demonstrate catheter an or wire access across a colon stent. There is air-filled dilated colon in the pelvis. IMPRESSION: Intraoperative fluoroscopic assistance provided during colonic stent placement. Electronically Signed   By: Donavan Foil M.D.   On: 12/04/2017 17:52   Ct Abdomen Pelvis W Contrast  Result Date: 12/01/2017 CLINICAL DATA:  Abdominal pain, nausea vomiting. No bowel movement for 1 week. EXAM: CT ABDOMEN AND PELVIS WITH CONTRAST TECHNIQUE: Multidetector CT imaging of the abdomen and pelvis was performed using the standard  protocol following bolus administration of intravenous contrast. CONTRAST:  134mL ISOVUE-300 IOPAMIDOL (ISOVUE-300) INJECTION 61% COMPARISON:  11/07/2017 FINDINGS: Lower chest: Known bilateral metastatic pulmonary nodules. The largest in the right lung base measuring 1.1 cm, stable. The largest in the left lung base measuring 1.4 cm, decreased from 2.2 cm on most recent body CT, image 26/63, sequence 4. Hepatobiliary: Known hepatic metastases, 1 in the dome of the liver and 1 in the lateral aspect of the left lobe of the liver are more indistinct and appears somewhat smaller. Cholelithiasis. No evidence of acute cholecystitis. Pancreas: Unremarkable. No pancreatic ductal dilatation or surrounding inflammatory changes. Spleen: Normal in size without focal abnormality. Adrenals/Urinary Tract: Normal appearance of the adrenal glands and right kidney. Ill-defined 2.2 x 2.4 cm mass in the left renal hilum, stable. No evidence of hydronephrosis. Stomach/Bowel: There is diffuse dilation of the colon which demonstrates featureless appearance and liquid contents to the level of the distal sigmoid colon stent. Vascular/Lymphatic: Aortic atherosclerosis. No enlarged abdominal or pelvic lymph nodes. Reproductive: Unchanged appearance of left adnexal mass. Normal right adnexa. Postcholecystectomy. Other: No abdominal wall hernia or abnormality. No abdominopelvic ascites. Musculoskeletal: No acute or significant osseous findings. Multilevel osteoarthritic changes. IMPRESSION: Probable obstruction at the level of the sigmoid colon stent with diffuse dilation of the upstream colon. The small bowel is fluid-filled but demonstrates normal caliber. Somewhat decreased in size known hepatic metastatic disease. Stable in size known left renal mass. Stable left adnexal mass. Decreased in size left lung base pulmonary metastatic nodule. Electronically Signed   By: Fidela Salisbury M.D.   On: 12/01/2017 17:32   Ct Abdomen Pelvis W  Contrast  Result Date: 11/07/2017 CLINICAL DATA:  Abdominal pain.  History of colon cancer. EXAM: CT ABDOMEN AND PELVIS WITH CONTRAST TECHNIQUE: Multidetector CT imaging of the abdomen and pelvis was performed using the standard protocol following bolus administration of intravenous contrast. CONTRAST:  19mL ISOVUE-300 IOPAMIDOL (ISOVUE-300) INJECTION 61% COMPARISON:  10/22/2017 FINDINGS: LOWER CHEST: 2.2 cm nodule at the left lung base. This is unchanged in size. A right basilar nodule measuring 10 mm is also unchanged in size. HEPATOBILIARY: Unchanged appearance of 2 hepatic metastases, 1 at the hepatic dome and 1 at the tip of the left hepatic lobe. Cholelithiasis without acute  inflammation. PANCREAS: Normal parenchymal contours without ductal dilatation. No peripancreatic fluid collection. SPLEEN: Normal. ADRENALS/URINARY TRACT: --Adrenal glands: Normal. --Right kidney/ureter: No hydronephrosis, nephroureterolithiasis, perinephric stranding or solid renal mass. --Left kidney/ureter: Unchanged appearance of 2.2 cm left perinephric mass. --Urinary bladder: Normal for degree of distention STOMACH/BOWEL: --Stomach/Duodenum: No hiatal hernia or other gastric abnormality. Normal duodenal course. --Small bowel: No dilatation or inflammation. --Colon: There is a stent within the sigmoid colon, unchanged. The remainder of the colon is unremarkable. --Appendix: Normal. VASCULAR/LYMPHATIC: Atherosclerotic calcification is present within the non-aneurysmal abdominal aorta, without hemodynamically significant stenosis. The portal vein, splenic vein, superior mesenteric vein and IVC are patent. No abdominal or pelvic lymphadenopathy. REPRODUCTIVE: Unchanged appearance of left adnexal mass measuring 3.3 x 3.0 cm. MUSCULOSKELETAL. Multilevel degenerative disc disease and facet arthrosis. No bony spinal canal stenosis. OTHER: None. IMPRESSION: 1. No acute abdominal or pelvic abnormality. 2. Unchanged appearance of stent  within the distal colon. No evidence of acute obstruction. 3. Unchanged appearance of bibasilar pulmonary metastases, hepatic metastases and left perinephric mass. 4. Unchanged size of indeterminate lesion of the left adnexa. 5.  Aortic Atherosclerosis (ICD10-I70.0). Electronically Signed   By: Ulyses Jarred M.D.   On: 11/07/2017 18:44   Dg Abd 2 Views  Result Date: 11/28/2017 CLINICAL DATA:  Colon cancer.  Abdominal distention EXAM: ABDOMEN - 2 VIEW COMPARISON:  67/59/1638 FINDINGS: Metallic stent noted in the sigmoid colon as seen on prior CT. Mild gaseous distention of large and small bowel above the stent concerning for a degree of bowel obstruction. No free air organomegaly. No acute bony abnormality. IMPRESSION: Stent within the sigmoid colon. Gaseous distention of bowel above the stent concerning for a degree of bowel obstruction. Electronically Signed   By: Rolm Baptise M.D.   On: 11/28/2017 10:04   Dg C-arm 1-60 Min-no Report  Result Date: 12/04/2017 Fluoroscopy was utilized by the requesting physician.  No radiographic interpretation.   Dg Colon W/water Raelene Bott Cm  Result Date: 12/02/2017 CLINICAL DATA:  84 year old female with colon cancer post sigmoid colon stent placement. Question obstruction? Subsequent encounter. EXAM: COLON WITH WATER SOLUTION CONTRAST COMPARISON:  12/01/2017 CT. Fluoroscopic time: 1 minutes and 24 seconds. 45.4 mGy. FINDINGS: Diluted water-soluble contrast was slowly instilled. Irregular narrowing of contrast as it traverses through the sigmoid colon stent. Colon proximal to the stent is dilated, predominantly fluid-filled and containing stool. Once contrast was placed into remainder of sigmoid colon patient was slightly uncomfortable and exam terminated and contrast drain. IMPRESSION: Irregular narrowing of contrast as it traverses through the sigmoid colon stent suggestive of tumor in this region. Although this does not cause complete obstruction, this contributes to  partial obstruction as the colon proximal to the stent is dilated and predominantly fluid-filled. Complete colon was not imaged as patient was uncomfortable after contrast traversed beyond the stent and study was terminated at this point. Electronically Signed   By: Genia Del M.D.   On: 12/02/2017 13:28   Flexible Sigmoidoscopy Findings: A Wallstent was found in the recto-sigmoid colon. A malignant-appearing, intrinsic severe stenosis measuring 4 mm (inner  diameter) was found in the distal sigmoid colon inside the stent and was  non-traversed. This was stented with a 22 mm x 9 cm WallFlex stent under  fluoroscopic guidance. We first advanced the balloon ERCP catheter with  the JAG long wire through it and under fluoroscopy guidance using dye to  confirm we were in the colonic lumen we advanced the wire and under  fluoroscopy guidance and endoscopic  guidance remove the balloon catheter  and placed in the stent in the customary fashion under both fluoroscopy  and endoscopic guidance seemingly in good position with the waist in the  middle of the stent and she was passing air and lots of stool which made  further visualization difficult The exam was otherwise normal throughout the examined colon. Impression: - Preparation of the colon was fair. - Stent in the colon. - Stricture in the distal sigmoid colon. Prosthesis  placed. - No specimens collected.  Subjective: Seen and examined and was walking the halls with no issues. Pain was minimal and abdomen was slightly tender. Tolerating food without any issues movements improved. No other concerns or complaints and ready to go home.  Discharge Exam: Vitals:   12/06/17 2002 12/07/17 0509  BP: 131/75 (!) 146/99  Pulse: 76 96  Resp: 17 17  Temp: 98.4 F (36.9  C) 98 F (36.7 C)  SpO2: 99% 99%   Vitals:   12/06/17 0500 12/06/17 1352 12/06/17 2002 12/07/17 0509  BP: 133/89 130/64 131/75 (!) 146/99  Pulse: 90 70 76 96  Resp: 18 16 17 17   Temp: 97.9 F (36.6 C) 98.2 F (36.8 C) 98.4 F (36.9 C) 98 F (36.7 C)  TempSrc:  Oral    SpO2: 97% 97% 99% 99%  Weight:      Height:       General: Pt is alert, awake, not in acute distress Cardiovascular: Irregularly Irregular, S1/S2 +, no rubs, no gallops Respiratory: Diminished bilaterally, no wheezing, no rhonchi Abdominal: Soft, mildly tender, ND, bowel sounds + Extremities: no edema, no cyanosis  The results of significant diagnostics from this hospitalization (including imaging, microbiology, ancillary and laboratory) are listed below for reference.    Microbiology: Recent Results (from the past 240 hour(s))  MRSA PCR Screening     Status: Abnormal   Collection Time: 12/04/17  7:18 AM  Result Value Ref Range Status   MRSA by PCR POSITIVE (A) NEGATIVE Final    Comment:        The GeneXpert MRSA Assay (FDA approved for NASAL specimens only), is one component of a comprehensive MRSA colonization surveillance program. It is not intended to diagnose MRSA infection nor to guide or monitor treatment for MRSA infections. RESULT CALLED TO, READ BACK BY AND VERIFIED WITH: Lise Auer [id number] @ [facility] Performed at [facility] [address], [place], [place] [address]     Labs: BNP (last 3 results) No results for input(s): BNP in the last 8760 hours. Basic Metabolic Panel: Recent Labs  Lab 12/02/17 0445 12/03/17 0525 12/04/17 0448 12/05/17 0516 12/06/17 1123 12/07/17 0528  NA 139 139 138 139 143 144  K 3.2* 3.3* 3.5 3.9 2.7* 3.9  CL 110 106 103 104 108 110  CO2 20* 24 26 25 26 27   GLUCOSE 95 100* 102* 90 124* 106*  BUN 11 12 10 6  <5* 5*  CREATININE 0.68 0.63 0.58 0.62 0.76 0.78  CALCIUM 8.3* 9.1 8.8* 8.5* 8.5* 8.5*  MG 1.8  --  1.8 1.7  1.6* 1.9  PHOS 2.4*  --  3.1 3.0 3.4 3.5   Liver Function Tests: Recent Labs  Lab 12/02/17 0445 12/04/17 0448 12/05/17 0516 12/06/17 1123 12/07/17 0528  AST 25 20 29 19 19   ALT 30 27 21 18 17   ALKPHOS 94 88 76 75 78  BILITOT 0.6 0.8 0.7 0.5 0.5  PROT 6.2* 6.3* 5.8* 6.0* 5.9*  ALBUMIN 3.0* 3.0*  2.6* 2.7* 2.8*   Recent Labs  Lab 12/01/17 1349  LIPASE 31   No results for input(s): AMMONIA in the last 168 hours. CBC: Recent Labs  Lab 12/01/17 1349  12/03/17 0525 12/04/17 0448 12/05/17 0516 12/06/17 1123 12/07/17 0528  WBC 6.6   < > 5.7 5.8 6.0 7.4 10.5  NEUTROABS 3.5  --   --  3.2 3.1 4.6 6.5  HGB 12.4   < > 12.2 12.8 12.3 12.2 12.1  HCT 36.9   < > 38.3 39.3 37.6 37.7 37.5  MCV 94.9   < > 96.7 95.6 94.9 96.2 96.4  PLT 402*   < > 430* 436* 351 416* 422*   < > = values in this interval not displayed.   Cardiac Enzymes: No results for input(s): CKTOTAL, CKMB, CKMBINDEX, TROPONINI in the last 168 hours. BNP: Invalid input(s): POCBNP CBG: No results for input(s): GLUCAP in the last 168 hours. D-Dimer No results for input(s): DDIMER in the last 72 hours. Hgb A1c No results for input(s): HGBA1C in the last 72 hours. Lipid Profile No results for input(s): CHOL, HDL, LDLCALC, TRIG, CHOLHDL, LDLDIRECT in the last 72 hours. Thyroid function studies No results for input(s): TSH, T4TOTAL, T3FREE, THYROIDAB in the last 72 hours.  Invalid input(s): FREET3 Anemia work up No results for input(s): VITAMINB12, FOLATE, FERRITIN, TIBC, IRON, RETICCTPCT in the last 72 hours. Urinalysis    Component Value Date/Time   COLORURINE YELLOW 12/01/2017 [place] 12/01/2017 1840   LABSPEC 1.039 (H) 12/01/2017 1840   PHURINE 6.0 12/01/2017 1840   GLUCOSEU NEGATIVE 12/01/2017 1840   HGBUR NEGATIVE 12/01/2017 1840   BILIRUBINUR NEGATIVE 12/01/2017 1840   KETONESUR NEGATIVE 12/01/2017 1840   PROTEINUR NEGATIVE 12/01/2017 1840   NITRITE NEGATIVE 12/01/2017 1840    LEUKOCYTESUR NEGATIVE 12/01/2017 1840   Sepsis Labs Invalid input(s): PROCALCITONIN,  WBC,  LACTICIDVEN Microbiology Recent Results (from the past 240 hour(s))  MRSA PCR Screening     Status: Abnormal   Collection Time: 12/04/17  7:18 AM  Result Value Ref Range Status   MRSA by PCR POSITIVE (A) NEGATIVE Final    Comment:        The GeneXpert MRSA Assay (FDA approved for NASAL specimens only), is one component of a comprehensive MRSA colonization surveillance program. It is not intended to diagnose MRSA infection nor to guide or monitor treatment for MRSA infections. RESULT CALLED TO, READ BACK BY AND VERIFIED WITH: Lise Auer [id number] @ [facility] Performed at [facility] [address], [place], [place] [address]    Time coordinating discharge: 35 minutes  SIGNED:  Kerney Elbe, DO [facility] 12/07/2017, 11:18 AM Pager [phone]  If 7PM-7AM, please contact night-coverage [email or web address] Password [email or web address]

## 2017-12-06 NOTE — Progress Notes (Signed)
PROGRESS NOTE    Mary [name]  GGY:[id number] DOB: 02/07/1933 DOA: 12/01/2017 PCP: Moshe Cipro, MD   Brief Narrative:  The patient is an 84 y.o.femalewith medical history significant of Metastatic sigmoid colon Adenocarcinomawith colonic strictures status post stenting by [facility],metastases to the liver, metastasis to the lungs, left renal and left adnexal masses left hydronephrosis, history of paroxysmal atrial fibrillation, HTN, HLD and other comorbids who presented with1 week of constipation unable to pass any stool now unable to pass any flatus either she has tried to use MiraLAX and doubled her dose but no relief. Was diagnosed with a likely colonic stent failureandhypokalemia which was replete.   GI consulted and Patient had sigmoidoscopy and found to have a malignant appearing. intrinsic severe stenosis measuring 4 mm in the distal sigmoid colon. Dr. Watt Climes stented this with a wall flex stent under fluoroscopic guidance without complications.  Patient was placed on a clear liquid diet and her diet has now been advanced to Soft.  She continues to have bowel movements so her MiraLAX was decreased. PT/OT evaluated and recommended Home Health PT.  Patient had several bowel movements overnight and this a.m. his blood work showed that she had electrolyte abnormalities so they will repeat.  The patient's electrolytes are improved in the morning she will be discharged home with home health.  Assessment & Plan:   Active Problems:   Cancer of sigmoid colon ([place])   Persistent atrial fibrillation (HCC)   Essential hypertension   Hyperlipidemia   Bowel obstruction (HCC)   Colon stricture (HCC)   Colon obstruction (HCC)   Generalized abdominal pain   Palliative care by specialist  Colonic Obstruction likely 2/2 due to Tumor S/p distal sigmoid colon stenting in the malignant appearing, intrinsic severe stenosis  -CT scan showed obstruction at the level of the stent in the sigmoid  colon.  -Gastroenterology was consulted and feel that her abdominal pain and distention is secondary to the colonic obstruction -Patient has history of a stricture involving the rectosigmoid junction which is status post stent placement in December 2018 -Barium enema 12/02/17 showed partial obstruction of the level of the previously placed stent -She underwent flexible sigmoidoscopy by Dr. Watt Climes on 12/04/17 she was found to have a malignant appearing, intrinsic severe stenosis in the distal sigmoid colon inside the stent. This was stented with a 22 mm x 9 cm wall flex stent under fluoroscopic guidance. -Was placed on a clear liquid diet and had her diet advanced to Soft Diet is tolerating that well -MiraLAX 3 times daily decreased to 17 g p.o. daily given her continued bowel movements. *Added back to her docusate 100 mg p.o. twice daily however patient continues to have several bowel movements so we will cut back and stop the docusate. -C/w Metaclopamide 5 mg IV every 6 as needed for nausea -Pain control with Hydromorphone 0.5-1 mg every 4 hours as needed for severe pain;  -I have added her home pain regimen with Oxycodone 5 mg every 6 as needed for moderate pain. -I have instructed the nurse to try the patient's home regimen as she is now advancing her diet and if necessary then use the IV Dilaudid -PT/OT to Evaluate and Treat recommending home health -Patient continued to have bowel movement overnight and because of it had a left leg abnormalities; will replete these and observe the patient overnight and discharge in the morning if she is improved  Metastatic Sigmoid Colon Cancer -Followed by Oncology Dr. Burr Medico as an outpatient -Has  metastasis to the liver and lungs -On palliative chemotherapy with Xeloda and Vectibix -Chemotherapy being held currently and per Oncology if she recovers well from obstruction will continue chemo as an option -Palliative medicine team following and patient now DNR will  have palliative fall at discharge -CEA was 3.06 on 4/19 -Resume Home Pain control now that obstruction appears to have improved after stenting   Essential Hypertension -Antihypertensives with Cardizem restarted; - -Amlodipine and Losartan are being held currently but can be resumed in AM  -Blood pressure was 130/64 today  -We will add IV Hydralazine 10 mg q6hprn for SBP>160 or DBP>100  Hypokalemia -Patient's potassium level this morning was 2.7 -This is likely a resultant of the patient's multiple bowel movements yesterday and this morning -Replete with p.o. 40 mEq KCl twice daily as well as 30 mEq of IV KCl -Continue to monitor and replete as necessary -Repeat CMP in AM and if potassium is improved patient to be discharged home.  Hypomagnesemia -Patient's magnesium level exam was 1.6 likely from multiple bowel movements -Replete with IV mag sulfate 2 g -Continue to monitor and replete as necessary -Repeat magnesium level in a.m.  Hyperlipidemia -Stable. -Upon further review patient is not on a Statin at home  Persistent Atrial Fibrillation -CHA2DS2VASc Score is 3 but not on anticoagulation due to risk of bleeding.   -Heart rate controlled with Cardizem at home but since was NPO was getting 2.5 mg of IV metoprolol tartrate q8h. -IV Metoprolol has now been discontinued and her home dose of Cardizem 240 mg is been reinstated. -Continue with telemetry monitoring  Thrombocytosis, -Fluctuating -Patient's platelet count went from 386 -> 436 -> 351 -> 416 -Continue to monitor and repeat CBC in a.m.  Hypophosphatemia -Patient's Phos Level is now 3.4 -Continue to monitor and repeat Phos Level in AM and Replete as Necessary  Depression -Resumed Escitalopram 10 mg p.o. daily   Seasonal Allergies/Allergic Rhinitis -Resume Fexofenadine 180 mg po Daily with Loratadine 10 mg po Pharmacy Substitution   GERD -Resume Omeprazole 40 mg p.o. daily with Pantoprazole 40 mg po Daily Pharmacy  Substitution   Glaucoma -Continue with Latanoprost 0.005% ophthalmic solution 1 drop in both eyes at bedtime along with Liquifirm Tears  DVT prophylaxis: Enoxaparin 40 mg subcu daily at bedtime Code Status: DO NOT RESUSCITATE Family Communication: Discussed with Family at bedside  Disposition Plan: Discharge the next 24 hours if electrolytes are improved  Consultants:   Gastroenterology [facility])  Medical Oncology  Palliative Care Medicine   Procedures:  Flexible Sigmoidoscopy Findings:      A Wallstent was found in the recto-sigmoid colon.      A malignant-appearing, intrinsic severe stenosis measuring 4 mm (inner       diameter) was found in the distal sigmoid colon inside the stent and was       non-traversed. This was stented with a 22 mm x 9 cm WallFlex stent under       fluoroscopic guidance. We first advanced the balloon ERCP catheter with       the JAG long wire through it and under fluoroscopy guidance using dye to       confirm we were in the colonic lumen we advanced the wire and under       fluoroscopy guidance and endoscopic guidance remove the balloon catheter       and placed in the stent in the customary fashion under both fluoroscopy       and endoscopic guidance seemingly in good  position with the waist in the       middle of the stent and she was passing air and lots of stool which made       further visualization difficult      The exam was otherwise normal throughout the examined colon. Impression:               - Preparation of the colon was fair.                           - Stent in the colon.                           - Stricture in the distal sigmoid colon. Prosthesis                            placed.                           - No specimens collected.   Antimicrobials:  Anti-infectives (From admission, onward)   None     Subjective: Patient was seen and examined this a.m. and she stated that her abdominal pain had improved significantly.  Last  pain medication administered to her was yesterday morning at 6 AM.  Patient complained of multiple bowel movement overnight and this morning.  Family was concerned with her being dehydrated she is tolerating soft diet well now.  No chest pain, shortness breath, nausea, vomiting.  No other complaints or concerns at this time.  Objective: Vitals:   12/05/17 1349 12/05/17 2045 12/06/17 0500 12/06/17 1352  BP: 131/85 131/73 133/89 130/64  Pulse: (!) 108 79 90 70  Resp: 16 18 18 16   Temp: 98.1 F (36.7 C) 97.8 F (36.6 C) 97.9 F (36.6 C) 98.2 F (36.8 C)  TempSrc: Oral Oral  Oral  SpO2: 97% 97% 97% 97%  Weight:      Height:        Intake/Output Summary (Last 24 hours) at 12/06/2017 1610 Last data filed at 12/06/2017 0500 Gross per 24 hour  Intake 240 ml  Output -  Net 240 ml   Filed Weights   12/01/17 2025 12/04/17 1043  Weight: 80.3 kg (177 lb) 80.3 kg (177 lb)   Examination: Physical Exam:  Constitutional: Well-nourished, well-developed Caucasian female is currently in no acute distress. Stating she continues to have multiple bowel movements overnight and this morning Eyes: Anicteric. Lids and conjunctive are normal ENMT: External ears and nose appear normal.  Grossly normal hearing.  Mucous members appear moist Neck: Supple with no JVD Respiratory: Diminished to auscultation bilaterally.  No appreciable wheezing, rales, rhonchi.  Unlabored breathing and patient has normal respiratory effort. Cardiovascular: Irregularly irregular.  No appreciable murmurs, rubs, gallops.  No lower extremity edema Abdomen: Soft, nontender, nondistended.  Bowel sounds present all 4 quadrants GU: Deferred Musculoskeletal: No contractures or cyanosis.  No joint deformities noted Skin: Skin is warm and dry with no appreciable rashes or lesions on limited skin evaluation. Neurologic: Crown nerves II through XII grossly intact with no appreciable focal deficits Psychiatric: Normal pleasant mood and  affect.  Intact judgment insight.  She is awake alert and oriented x3  Data Reviewed: I have personally reviewed following labs and imaging studies  CBC: Recent Labs  Lab 12/01/17 1349 12/02/17 0445 12/03/17 0525 12/04/17 0448 12/05/17  2952 12/06/17 1123  WBC 6.6 5.9 5.7 5.8 6.0 7.4  NEUTROABS 3.5  --   --  3.2 3.1 4.6  HGB 12.4 11.6* 12.2 12.8 12.3 12.2  HCT 36.9 36.0 38.3 39.3 37.6 37.7  MCV 94.9 96.5 96.7 95.6 94.9 96.2  PLT 402* 386 430* 436* 351 841*   Basic Metabolic Panel: Recent Labs  Lab 12/01/17 1349 12/02/17 0445 12/03/17 0525 12/04/17 0448 12/05/17 0516 12/06/17 1123  NA 137 139 139 138 139 143  K 2.9* 3.2* 3.3* 3.5 3.9 2.7*  CL 102 110 106 103 104 108  CO2 26 20* 24 26 25 26   GLUCOSE 124* 95 100* 102* 90 124*  BUN 14 11 12 10 6  <5*  CREATININE 0.69 0.68 0.63 0.58 0.62 0.76  CALCIUM 9.2 8.3* 9.1 8.8* 8.5* 8.5*  MG 1.7 1.8  --  1.8 1.7 1.6*  PHOS  --  2.4*  --  3.1 3.0 3.4   GFR: Estimated Creatinine Clearance: 57.9 mL/min (by C-G formula based on SCr of 0.76 mg/dL). Liver Function Tests: Recent Labs  Lab 12/01/17 1349 12/02/17 0445 12/04/17 0448 12/05/17 0516 12/06/17 1123  AST 24 25 20 29 19   ALT 32 30 27 21 18   ALKPHOS 103 94 88 76 75  BILITOT 0.7 0.6 0.8 0.7 0.5  PROT 7.0 6.2* 6.3* 5.8* 6.0*  ALBUMIN 3.3* 3.0* 3.0* 2.6* 2.7*   Recent Labs  Lab 12/01/17 1349  LIPASE 31   No results for input(s): AMMONIA in the last 168 hours. Coagulation Profile: No results for input(s): INR, PROTIME in the last 168 hours. Cardiac Enzymes: No results for input(s): CKTOTAL, CKMB, CKMBINDEX, TROPONINI in the last 168 hours. BNP (last 3 results) No results for input(s): PROBNP in the last 8760 hours. HbA1C: No results for input(s): HGBA1C in the last 72 hours. CBG: No results for input(s): GLUCAP in the last 168 hours. Lipid Profile: No results for input(s): CHOL, HDL, LDLCALC, TRIG, CHOLHDL, LDLDIRECT in the last 72 hours. Thyroid Function  Tests: No results for input(s): TSH, T4TOTAL, FREET4, T3FREE, THYROIDAB in the last 72 hours. Anemia Panel: No results for input(s): VITAMINB12, FOLATE, FERRITIN, TIBC, IRON, RETICCTPCT in the last 72 hours. Sepsis Labs: No results for input(s): PROCALCITON, LATICACIDVEN in the last 168 hours.  Recent Results (from the past 240 hour(s))  MRSA PCR Screening     Status: Abnormal   Collection Time: 12/04/17  7:18 AM  Result Value Ref Range Status   MRSA by PCR POSITIVE (A) NEGATIVE Final    Comment:        The GeneXpert MRSA Assay (FDA approved for NASAL specimens only), is one component of a comprehensive MRSA colonization surveillance program. It is not intended to diagnose MRSA infection nor to guide or monitor treatment for MRSA infections. RESULT CALLED TO, READ BACK BY AND VERIFIED WITH: Lise Auer [id number] @ [place] Performed at [facility] [address], [place], [place] [address]     Radiology Studies: No results found. Scheduled Meds: . Chlorhexidine Gluconate Cloth  6 each Topical Q0600  . diltiazem  240 mg Oral Daily  . docusate sodium  100 mg Oral BID  . enoxaparin (LOVENOX) injection  40 mg Subcutaneous QHS  . escitalopram  10 mg Oral Daily  . latanoprost  1 drop Both Eyes QHS  . loratadine  10 mg Oral Daily  . multivitamin  1 tablet Oral Daily  . mupirocin ointment  1 application Nasal BID  .  pantoprazole  40 mg Oral Daily  . polyethylene glycol  17 g Oral Daily  . potassium chloride  40 mEq Oral BID  . sodium phosphate  1 enema Rectal Once  . thiamine  100 mg Oral Daily   Continuous Infusions: . magnesium sulfate 1 - 4 g bolus IVPB    . potassium chloride      LOS: 5 days   Kerney Elbe, DO [facility] Pager [phone]  If 7PM-7AM, please contact night-coverage [email or web address] Password [email or web address] 12/06/2017, 4:10 PM

## 2017-12-07 ENCOUNTER — Encounter (HOSPITAL_COMMUNITY): Payer: Self-pay

## 2017-12-07 LAB — CBC WITH DIFFERENTIAL/PLATELET
BASOS PCT: 0 %
Basophils Absolute: 0 10*3/uL (ref 0.0–0.1)
EOS ABS: 0.5 10*3/uL (ref 0.0–0.7)
Eosinophils Relative: 5 %
HCT: 37.5 % (ref 36.0–46.0)
HEMOGLOBIN: 12.1 g/dL (ref 12.0–15.0)
Lymphocytes Relative: 17 %
Lymphs Abs: 1.8 10*3/uL (ref 0.7–4.0)
MCH: 31.1 pg (ref 26.0–34.0)
MCHC: 32.3 g/dL (ref 30.0–36.0)
MCV: 96.4 fL (ref 78.0–100.0)
MONO ABS: 1.7 10*3/uL — AB (ref 0.1–1.0)
MONOS PCT: 16 %
NEUTROS ABS: 6.5 10*3/uL (ref 1.7–7.7)
NEUTROS PCT: 62 %
Platelets: 422 10*3/uL — ABNORMAL HIGH (ref 150–400)
RBC: 3.89 MIL/uL (ref 3.87–5.11)
RDW: 17.2 % — AB (ref 11.5–15.5)
WBC: 10.5 10*3/uL (ref 4.0–10.5)

## 2017-12-07 LAB — COMPREHENSIVE METABOLIC PANEL
ALBUMIN: 2.8 g/dL — AB (ref 3.5–5.0)
ALT: 17 U/L (ref 14–54)
ANION GAP: 7 (ref 5–15)
AST: 19 U/L (ref 15–41)
Alkaline Phosphatase: 78 U/L (ref 38–126)
BUN: 5 mg/dL — AB (ref 6–20)
CHLORIDE: 110 mmol/L (ref 101–111)
CO2: 27 mmol/L (ref 22–32)
Calcium: 8.5 mg/dL — ABNORMAL LOW (ref 8.9–10.3)
Creatinine, Ser: 0.78 mg/dL (ref 0.44–1.00)
GFR calc Af Amer: >60 mL/min (ref >60)
GFR calc non Af Amer: >60 mL/min (ref >60)
GLUCOSE: 106 mg/dL — AB (ref 65–99)
Potassium: 3.9 mmol/L (ref 3.5–5.1)
SODIUM: 144 mmol/L (ref 135–145)
Total Bilirubin: 0.5 mg/dL (ref 0.3–1.2)
Total Protein: 5.9 g/dL — ABNORMAL LOW (ref 6.5–8.1)

## 2017-12-07 LAB — PHOSPHORUS: Phosphorus: 3.5 mg/dL (ref 2.5–4.6)

## 2017-12-07 LAB — MAGNESIUM: Magnesium: 1.9 mg/dL (ref 1.7–2.4)

## 2017-12-07 MED ORDER — POTASSIUM CHLORIDE ER 20 MEQ PO TBCR: 20.0000 meq | EXTENDED_RELEASE_TABLET | Freq: Every day | ORAL | 0 refills | Status: AC

## 2017-12-07 NOTE — Care Management Note (Signed)
Case Management Note  Patient Details  Name: Mary [name] MRN: [id number] Date of Birth: 02/07/1933  Subjective/Objective:     Metastatic colon cancer                Action/Plan: see NCM previous notes Contacted [facility] to make aware of dc home today. Faxed orders to [facility]. Waiting DC summary. Will leave note for NCM to fax dc summary to agency.    Expected Discharge Date:  12/07/17               Expected Discharge Plan:  [place]  In-House Referral:  NA  Discharge planning Services  CM Consult  Post Acute Care Choice:  Home Health(Has rw) Choice offered to:  Patient  DME Arranged:  N/A DME Agency:  NA  HH Arranged:  PT HH Agency:  [facility]  Status of Service:  Completed, signed off  If discussed at [place] of Stay Meetings, dates discussed:    Additional Comments:  Erenest Rasher, RN 12/07/2017, 4:35 PM

## 2017-12-07 NOTE — Evaluation (Signed)
Occupational Therapy Evaluation Patient Details Name: Mary [name] MRN: [id number] DOB: 02/07/1933 Today's Date: 12/07/2017    History of Present Illness 84yo female with 1 week history of not being able to pass stool or flatus, abdominal distension, and nausea/vomiting. Diagnosed with colon obstruction possibly secondary to colonic stent failure. PMH AAA, hx colon and kidney cancer with lung and liver mets, HTN, HOH, blind R eye, macular degeneration, hx colonic stent placement, A-fib    Clinical Impression   Pt mod I with ADL activity.     Follow Up Recommendations  No OT follow up    Equipment Recommendations  None recommended by OT    Recommendations for Other Services       Precautions / Restrictions Precautions Precautions: None      Mobility Bed Mobility Overal bed mobility: Modified Independent                Transfers Overall transfer level: Modified independent   Transfers: Sit to/from Stand;Stand Pivot Transfers Sit to Stand: Modified independent (Device/Increase time)              Balance Overall balance assessment: Mild deficits observed, not formally tested                                         ADL either performed or assessed with clinical judgement   ADL Overall ADL's : At baseline                                       General ADL Comments: Educated pt on safety with tub transfers, non skid mat in and out of tub.  Pt states friend will be present when she takes a shower     Vision Patient Visual Report: No change from baseline              Pertinent Vitals/Pain Pain Assessment: No/denies pain     Hand Dominance     Extremity/Trunk Assessment Upper Extremity Assessment Upper Extremity Assessment: Overall WFL for tasks assessed           Communication Communication Communication: HOH   Cognition Arousal/Alertness: Awake/alert Behavior During Therapy: WFL for tasks  assessed/performed Overall Cognitive Status: Within Functional Limits for tasks assessed                                                Home Living Family/patient expects to be discharged to:: Private residence Living Arrangements: Alone Available Help at Discharge: Family;Available PRN/intermittently Type of Home: House Home Access: Stairs to enter [employer] of Steps: 1 step  Entrance Stairs-Rails: None Home Layout: One level     Bathroom Shower/Tub: Teacher, early years/pre: Standard     Home Equipment: Environmental consultant - 2 wheels;Wheelchair - manual   Additional Comments: pt has accessible home due to hx of her spouse (now deceased) having CVA and w/c bound      Prior Functioning/Environment Level of Independence: Independent        Comments: is blind in right eye, macular degen        OT Problem List:        OT Treatment/Interventions:  OT Goals(Current goals can be found in the care plan section) Acute Rehab OT Goals Patient Stated Goal: to go home   OT Frequency:     Barriers to D/C:               AM-PAC PT "6 Clicks" Daily Activity     Outcome Measure Help from another person eating meals?: None Help from another person taking care of personal grooming?: None Help from another person toileting, which includes using toliet, bedpan, or urinal?: None Help from another person bathing (including washing, rinsing, drying)?: None Help from another person to put on and taking off regular upper body clothing?: None Help from another person to put on and taking off regular lower body clothing?: None 6 Click Score: 24   End of Session Nurse Communication: Mobility status  Activity Tolerance: Patient tolerated treatment well Patient left: in chair;with call bell/phone within reach                   Time: 5631-4970 OT Time Calculation (min): 13 min Charges:  OT General Charges $OT Visit: 1 Visit OT Evaluation $OT  Eval Low Complexity: 1 Low G-Codes:     Kari Baars, [place]  Payton Mccallum D 12/07/2017, 10:13 AM

## 2017-12-08 ENCOUNTER — Telehealth: Payer: Self-pay

## 2017-12-08 NOTE — Telephone Encounter (Signed)
Spoke with patient's daughter in-law per Dr. Burr Medico alright to cancel her appointment on 12/12/17 and keep the ones on 12/26/17.  Appointments cancelled and patient's daughter in law in agreement with plan.

## 2017-12-08 NOTE — Telephone Encounter (Signed)
-----   Message from Truitt Merle, MD sent at 12/08/2017  1:02 PM EDT ----- She was recently hospitalized for bowel obstruction, got discharged after stent exchange.  Please call patient, to see if she still wants to keep her appointment on May 3.  I am okay to cancel it, and see her on 5/17 (scheduled), so she has more time to recover. Thanks   Krista Blue

## 2017-12-08 NOTE — Progress Notes (Signed)
Faxed today w/confirmation d/c summary to [facility] fax#[phone].

## 2017-12-11 ENCOUNTER — Other Ambulatory Visit: Payer: Medicare Other

## 2017-12-11 ENCOUNTER — Ambulatory Visit: Payer: Medicare Other

## 2017-12-12 ENCOUNTER — Other Ambulatory Visit: Payer: Medicare Other

## 2017-12-12 ENCOUNTER — Ambulatory Visit: Payer: Medicare Other

## 2017-12-22 NOTE — Progress Notes (Signed)
[phone] [phone]  Clinic Follow Up Note   Patient Care Team: Moshe Cipro, MD as PCP - General (Internal Medicine) Alexis Frock, MD as Consulting Physician (Urology) Michael Boston, MD as Consulting Physician (General Surgery) Toma Deiters, MD as Referring Physician (Gastroenterology)   Date of Service:  12/26/2017  CHIEF COMPLAINTS:  Follow up Metastatic sigmoid colon Adenocarcinoma  Oncology History   Cancer Staging Cancer of left colon [facility]) Staging form: Colon and Rectum, AJCC 8th Edition - Clinical stage from 09/04/2016: Stage IVB (cTX, cNX, pM1b) - Signed by Truitt Merle, MD on 11/12/2016       Cancer of sigmoid colon ([place])   09/02/2016 Procedure    Colonoscopy Diverticulosis (scattered). Pt has about 10 cm structure starting at 20 cm with abnormal mucosa. Biopsies taken.       09/02/2016 Pathology Results    MODERATELY DIFFERENTIATED ADENOCARCINOMA OF COLON AT 20 CM      09/04/2016 Initial Diagnosis    Cancer of left colon ([place])      09/06/2016 Imaging    CT CAP w Contrast 1. No acute abdominopelvic process.  2. Heterogenous hypodense area involving the medial edge of the left kidney with multiple small soft tissue like stranding involving the left pararenal space. Findings are suspicious for malignancy of the left kidney with small pararenal metastases. MRI abdomen with contrast should be considered to further evaluate.  3. Vague hypodensity involving the lateral segment of the liver. This can also be reevaluated with MRI.  4. Partially visualized 5 mm posterior right lower lobe nodular density. CT chest without contrast follow up is recommended.  5. Complex multilobular left ovarian lesion may reflect a cluster of cysts.      10/10/2016 Procedure    Operative Report by Dr. Tresa Moore on 10/18/16 Procedure: 1) Cystoscopy, left retrograde pyelogram interpretation. 2) Left diagnostic uteroscopy. 3) Insertion of  left uteral stent; 5 x 24 Polaris with tether. Findings: 1) Unremarkable bladder. 2) Unremarkable left retrograde pyelogram. 3) No evidence of intraluminal urothelial neoplasm whatsoever with insepction of the left kidney and ureter times several. 4) Successful placement of left ureteral stent, proximal in renal pelvis and distal in urinary bladder.      10/15/2016 Imaging    MRI abdomen pelvis 1. There are 2 enhancing hepatic lesions highly worrisome for metastatic disease, likely metastatic colon cancer. 2. The lesion of concern in the anterior suprahilar lip of the left kidney is also enhancing, worrisome for neoplasm. This has a somewhat atypical appearance for renal cell carcinoma and could reflect a metastasis as well. 3. New left-sided hydroureter with delayed contrast excretion consistent with distal ureteral obstruction. Specific etiology uncertain. 4. Complex cystic and solid left adnexal lesion could reflect cystic neoplasm, although does not appear aggressive or appear to reflect the cause of the distal left ureteral obstruction. 5. Sigmoid colon wall thickening could be related to reported newly diagnosed colon cancer.      11/06/2016 PET scan    PET 11/06/2016 IMPRESSION: 1. Hypermetabolic pulmonary and hepatic lesions, most consistent with metastatic colon cancer, with the hypermetabolic primary located in the sigmoid colon. 2. Left renal and left adnexal lesions, better seen and evaluated on 10/15/2016. 3. Persistent moderate left hydronephrosis. 4. Aortic atherosclerosis (ICD10-170.0).      11/11/2016 Pathology Results    Liver, needle/core biopsy, left lobe - METASTATIC ADENOCARCINOMA. The histologic features are consistent with a primary gastrointestinal adenocarcinoma.      05/19/2017 Imaging  CT Abdomen Pelvis, 05/19/2017 IMPRESSION: 1. Worsening pulmonary and hepatic metastatic disease. Primary sigmoid lesion is stable to minimally enlarged. 2. Stable  to slight enlargement of a left perinephric mass, likely a metastasis. Renal cell carcinoma is not excluded. Associated pelvocaliectasis versus mild hydronephrosis. 3. Borderline left periaortic lymph nodes, slightly enlarged. 4. Cholelithiasis. 5.  Aortic atherosclerosis (ICD10-170.0). 6. Cystic and solid lesion in the left adnexa, as on prior exams, likely ovarian in origin.      06/06/2017 -  Chemotherapy    IV antibody Vectibix every [redacted] weeks along with oral chemo Xeloda for 1 week on and 1 week off starting 06/06/17.   For cycle 2 she had 2033m Xeloda for 10 days and then 10 days off. Return to 1 week on and 1 week off with starting with Cycle 3. Held after 07/24/17 due to hospitalization for SBO. Restarted Xeloda and Vectibix on 09/04/17      08/07/2017 - 08/16/2017 Hospital Admission    Admit date: 08/07/17 Admission diagnosis: Bowel Obstruction  Additional comments: She was admitted to the hospital on 08/07/17 after [facility] due to a bowel obstruction.  She had a sigmoid colon stent placement on 08/11/17. Bowel obstruction resolved.  CT scan in hospital showed great partial response to chemotherapy. She will continue chemotherapy given good overall tolerance.        08/07/2017 Imaging    CT AP W Contrast 08/07/17 IMPRESSION: Low colonic obstruction secondary to a rectosigmoid stricture at the site of the patient's prior colonic mass, which is no longer evident on CT. Mild upstream colonic wall thickening raises the possibility of superimposed colitis.  Improving hepatic metastases, as above. Left perirenal metastasis, decreased. Visualized pulmonary metastases at the lung bases are grossly unchanged.  3.5 cm mixed cystic/solid left ovarian mass is mildly decreased.       08/11/2017 Procedure    By Dr. MWatt Climes Flexible Sigmoidoscopy 08/11/17 IMPRESSION:   - Preparation of the colon was fair. - Likely malignant partially obstructing  tumor in the recto-sigmoid colon versus necrotic scarring. Injected with contrast proximal to obstruction to confirm proper position. Prosthesis placed. - Stool in the rectum. - No specimens collected.       10/22/2017 Imaging    CT CAP W Contrast 10/22/17 IMPRESSION: 1. Significant interval improvement and liver metastasis. 2. The left perinephric mass is slightly decreased in size in the interval. 3. No significant interval change in the appearance of multiple pulmonary metastasis. 4. Stable appearance of mediastinal adenopathy. 5. Gallstones 6.  Aortic Atherosclerosis (ICD10-I70.0). 7. Colonic stent remains in place without evidence for bowel obstruction.      12/01/2017 - 12/07/2017 Hospital Admission    Admit date: 12/01/17 Admission diagnosis: Bowel Obstruction and abdominal pain Additional comments: had flexible sigmoidoscopy by Dr. MWatt Climeson 12/04/17. Chemo was held during this time.       12/04/2017 Procedure    Flexible Sigmoidoscopy 12/04/17 IMPRESSION - Preparation of the colon was fair. - Stent in the colon. - Stricture in the distal sigmoid colon. Prosthesis placed. - No specimens collected.      HISTORY OF PRESENTING ILLNESS (10/25/2016):  Mary [id number].[id number]. female is here because of adenocarcinoma of the colon and a left renal mass. The pt noticed worsening episodes of abdominal cramping and nausea with bowel movements in July 2017. She had an abdominal [place]aper[place], which was unremarkable. A colonoscopy was then performed at the patient's request, which found a mass about 20 cm  from the anal verge. Biopsy showed moderately differentiated adenocarcinoma. MRI done, with a lesion noticed in the left kidney near the pelvis, a 11 mm hepatic lesion, 4 mm lung nodule, and possible cystic left ovarian mass. Pt saw Dr. Johney Maine for information on abdominal surgery for her colon, which she declined. She also saw Dr. Lorna Dibble, who is considering a nephrectomy, but is planning a  sternoscopy to better characterize the kidney lesion. She presents today for continued treatment.    She presents today with her son and daughter-in-law. The symptoms started in late July 2017. She had some vomiting, abdominal pain, and constipation, but not bleeding. Her PCP never found any anemia. Initially she lost her appetite, but now she is taking multiple vitamins and supplements, so her appetite is back. Her bowel has improved, but she still has abdominal pain that comes and goes, lasting minutes. The pain is tolerable with aleve. She did not tolerate Tramadol well; nausea and vomiting. She has some occasional left sides back pain. Denies weight loss, or any other concerns. She is able to do everything she needs to around the house.   She had a partial hysterectomy, and a lumpectomy. She has a history of HTN, atrial fibrillation, high cholesterol. She has some hearing loss and can not see out of her right eye. Not a diabetic, but her son and her mother are. No family history of colon cancer. Her sister had breast cancer in her 22's and her brother had lung cancer in his 39's. Never smoker. Doesn't drink alcohol. 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 She has two sons; one lives an hour away, and the other lives in [place]. She lives alone.   CURRENT THERAPY:   IV antibody Vectibix every [redacted] weeks along with oral chemo Xeloda for 1 week on and 1 week off starting 06/06/17. For cycle 2 she had 2037m Xeloda for 10 days and then 10 days off. Return to 1 week on and 1 week off with starting with Cycle 3. Held after 07/24/17 due to hospitalization for SBO. Restarted Xeloda and Vectibix on 09/04/17. Due to skin toxicities we reduced Xeloda to 2000 mg in the AM and 15054min the PM 1 week on and 1 week off on 10/31/17. All treatment held since 4/191/19 due to recurrent bowel obstruction. Restart treatment on 12/26/17.   INTERVAL HISTORY:    AnJISSELLE POTHs here for a follow up post hospitalization. Since  last visit she did not gain relief from bowel obstruction, so she was admitted to hospital 12/01/17 - 12/07/17. She underwent flexible sigmoidoscopy and had stent replaced by Dr. MaWatt Climesn 12/04/17.   She presents to the clinic today accompanied by her daughter-in-law. She notes she is feeling better since her hospitalization. She notes she is eating better now. She is not currently on pain medication. She has only taken 3 aleve in the past 2 weeks. She notes she is drinking more water. She noticed she has seen Dr. GrJohney Maines outpatient and does not plan to return to him at this time. She notes she has enough Xeloda for 3 cycles. Her daughter-in-law notes they would like to change her appointments back to Thursdays.    On review of symptoms, pt notes her bowel movements are better and she takes miralax to help prevent constipation. She notes her energy has improved and is up moving around more than 50% of the time. She has been outside as well. She notes she has fragile nails and are sensitive.  MEDICAL HISTORY:  Past Medical History:  Diagnosis Date  . Aneurysm of ascending aorta ([facility]) 12/03/2016   3.7 cm by echo 10/2016  . Colon cancer [facility])    dx via biospy from colonoscopy-- malignant ---  currently having work-up done w/ dr gross (general surgeon) and coordinate surgery w/ urologsit for renal tumor  . Essential hypertension 10/31/2016  . GERD (gastroesophageal reflux disease)   . Glaucoma   . Hiatal hernia   . HOH (hard of hearing)    bilateral even w/ hearing aids  . Hyperlipidemia   . Kidney tumor    left side  . Legally blind in right eye, as defined in [place]    due to macular degeneration  . Macular degeneration   . Nausea    intermittant due to colon mass  . Ovarian cyst   . Persistent atrial fibrillation ([place]) 10/31/2016   on ASA only  . PONV (postoperative nausea and vomiting)   . Wears glasses   . Wears hearing aid    bilateral    SURGICAL HISTORY: Past Surgical History:    Procedure Laterality Date  . BREAST CYST EXCISION  1990   benign  . COLONIC STENT PLACEMENT N/A 12/04/2017   Procedure: COLONIC STENT PLACEMENT;  Surgeon: Clarene Essex, MD;  Location: [facility];  Service: Endoscopy;  Laterality: N/A;  . COLONOSCOPY  09/04/2016  . CYSTOSCOPY WITH RETROGRADE PYELOGRAM, URETEROSCOPY AND STENT PLACEMENT Left 10/10/2016   Procedure: CYSTOSCOPY WITH RETROGRADE PYELOGRAM, DIAGNOSTIC URETEROSCOPY  AND STENT PLACEMENT;  Surgeon: Alexis Frock, MD;  Location: [facility];  Service: Urology;  Laterality: Left;  . ESOPHAGOGASTRODUODENOSCOPY  11/07/2014  . FLEXIBLE SIGMOIDOSCOPY N/A 08/11/2017   Procedure: FLEXIBLE SIGMOIDOSCOPY;  Surgeon: Clarene Essex, MD;  Location: [facility];  Service: Endoscopy;  Laterality: N/A;  With Fluoroscopy for colonic stent placement for colonic stricture  . FLEXIBLE SIGMOIDOSCOPY N/A 12/04/2017   Procedure: FLEXIBLE SIGMOIDOSCOPY;  Surgeon: Clarene Essex, MD;  Location: [facility];  Service: Endoscopy;  Laterality: N/A;  . NASAL SINUS SURGERY    . TONSILLECTOMY      SOCIAL HISTORY: Social History   Socioeconomic History  . Marital status: Widowed    Spouse name: Not on file  . Number of children: Not on file  . Years of education: Not on file  . Highest education level: Not on file  Occupational History  . Not on file  Social Needs  . Financial resource strain: Not on file  . Food insecurity:    Worry: Not on file    Inability: Not on file  . Transportation needs:    Medical: Not on file    Non-medical: Not on file  Tobacco Use  . Smoking status: Never Smoker  . Smokeless tobacco: Never Used  Substance and Sexual Activity  . Alcohol use: No  . Drug use: No  . Sexual activity: Not on file  Lifestyle  . Physical activity:    Days per week: Not on file    Minutes per session: Not on file  . Stress: Not on file  Relationships  . Social connections:    Talks on phone: Not on file    Gets together: Not  on file    Attends religious service: Not on file    Active member of club or organization: Not on file    Attends meetings of clubs or organizations: Not on file    Relationship status: Not on file  . Intimate partner violence:    Fear  of current or ex partner: Not on file    Emotionally abused: Not on file    Physically abused: Not on file    Forced sexual activity: Not on file  Other Topics Concern  . Not on file  Social History Narrative  . Not on file    FAMILY HISTORY: Family History  Problem Relation Age of Onset  . Cancer Sister 64       breast cancer   . Cancer Brother 78       lung cancer   . Diabetes Son   . High blood pressure Son   . Peripheral Artery Disease Mother   . High blood pressure Mother     ALLERGIES:  is allergic to benazepril; tylenol [acetaminophen]; and bactrim [sulfamethoxazole-trimethoprim].  MEDICATIONS:  Current Outpatient Medications  Medication Sig Dispense Refill  . amLODipine (NORVASC) 5 MG tablet Take 1 tablet (5 mg total) by mouth daily. 90 tablet 1  . capecitabine (XELODA) 500 MG tablet Take 4 tablets ( 2000 mg ) in  AM ;  3 tablets ( 1500 mg ) in  PM for  7 days on ,  Off 7 days. 98 tablet 2  . diltiazem (CARDIZEM CD) 240 MG 24 hr capsule Take 1 capsule (240 mg total) by mouth daily. 90 capsule 1  . escitalopram (LEXAPRO) 10 MG tablet Take 10 mg by mouth daily.    . fexofenadine (ALLEGRA) 180 MG tablet Take 180 mg by mouth daily.    . furosemide (LASIX) 40 MG tablet Take 1 tablet (40 mg total) by mouth daily. 90 tablet 1  . latanoprost (XALATAN) 0.005 % ophthalmic solution Place 1 drop into both eyes at bedtime.    Marland Kitchen losartan (COZAAR) 100 MG tablet Take 1 tablet (100 mg total) by mouth daily. 90 tablet 0  . Multiple Vitamins-Minerals (VITEYES AREDS ADVANCED) CAPS Take 1 capsule by mouth daily.    Marland Kitchen omeprazole (PRILOSEC) 40 MG capsule Take 40 mg by mouth daily.    . ondansetron (ZOFRAN ODT) 4 MG disintegrating tablet Take 1 tablet (4  mg total) by mouth every 8 (eight) hours as needed for nausea or vomiting. 30 tablet 0  . oxyCODONE (OXY IR/ROXICODONE) 5 MG immediate release tablet Take 1 tablet (5 mg total) by mouth every 6 (six) hours as needed for moderate pain or severe pain. 90 tablet 0  . Polyethyl Glycol-Propyl Glycol (SYSTANE) 0.4-0.3 % SOLN Apply 1 drop to eye daily as needed (dry eyes).     . polyethylene glycol (MIRALAX / GLYCOLAX) packet Take 17 g by mouth daily as needed for moderate constipation. 30 each 0  . Potassium Chloride ER 20 MEQ TBCR Take 20 mEq by mouth daily. 30 tablet 0  . sennosides-docusate sodium (SENOKOT-S) 8.6-50 MG tablet Take 1-2 tablets by mouth 3 (three) times daily.    . urea (CARMOL) 10 % cream Apply topically as needed. 71 g 0  . Bisacodyl (DULCOLAX PO) Take 2 tablets by mouth 2 (two) times daily.    . bisacodyl (DULCOLAX) 10 MG suppository Place 10 mg rectally as needed for moderate constipation.     Current Facility-Administered Medications  Medication Dose Route Frequency Provider Last Rate Last Dose  . sodium chloride flush (NS) 0.9 % injection 3 mL  3 mL Intravenous PRN Skeet Latch, MD       Facility-Administered Medications Ordered in Other Visits  Medication Dose Route Frequency Provider Last Rate Last Dose  . 0.9 %  sodium chloride infusion  Intravenous Once Truitt Merle, MD      . heparin lock flush 100 unit/mL  500 Units Intracatheter Once PRN Truitt Merle, MD      . panitumumab (VECTIBIX) 500 mg in sodium chloride 0.9 % 100 mL chemo infusion  6 mg/kg (Treatment Plan Recorded) Intravenous Once Truitt Merle, MD      . sodium chloride flush (NS) 0.9 % injection 10 mL  10 mL Intracatheter PRN Truitt Merle, MD        REVIEW OF SYSTEMS:   Constitutional: Denies fevers, chills or abnormal  (+) improved energy Eyes: Denies blurriness of vision, double vision or watery eyes Ears, nose, mouth, throat, and face: Denies mucositis or sore throat Respiratory: Denies cough, dyspnea or  wheezes Cardiovascular: Denies palpitation, chest discomfort  Gastrointestinal:  Denies heartburn (+) improved bowel movement Skin: Denies rashes (+) fragile nails  Lymphatics: Denies new lymphadenopathy or easy bruising MSK: negative Neurological:Denies numbness, tingling or new weaknesses Behavioral/Psych: Mood is stable, no new changes  All other systems were reviewed with the patient and are negative.  PHYSICAL EXAMINATION:  ECOG PERFORMANCE STATUS: 1 - Symptomatic but completely ambulatory   Vitals:   12/26/17 0806  BP: (!) 147/73  Pulse: 79  Resp: 17  Temp: 97.9 F (36.6 C)  TempSrc: Oral  SpO2: 100%  Weight: 173 lb 14.4 oz (78.9 kg)  Height: 5' 6"  (1.676 m)      GENERAL:alert, no distress and comfortable SKIN: skin color, texture, turgor are normal  EYES: normal, conjunctiva are pink and non-injected, sclera clear OROPHARYNX:no exudate, no erythema and lips, buccal mucosa, and tongue normal  NECK: supple, thyroid normal size, non-tender, without nodularity LYMPH:  no palpable lymphadenopathy in the cervical, axillary or inguinal LUNGS: clear to auscultation and percussion with normal breathing effort HEART: regular rate & rhythm and no murmurs and (+) symmetrical lower extremity edema  ABDOMEN:abdomen soft, non-tender (+) active bowel sounds Musculoskeletal:no cyanosis of digits and no clubbing  PSYCH: alert & oriented x 3 with fluent speech NEURO: no focal motor/sensory deficits   LABORATORY DATA:  I have reviewed the data as listed CBC Latest Ref Rng & Units 12/26/2017 12/07/2017 12/06/2017  WBC 3.9 - 10.3 K/uL 7.2 10.5 7.4  Hemoglobin 11.6 - 15.9 g/dL 11.7 12.1 12.2  Hematocrit 34.8 - 46.6 % 36.6 37.5 37.7  Platelets 145 - 400 K/uL 387 422(H) 416(H)   CMP Latest Ref Rng & Units 12/26/2017 12/07/2017 12/06/2017  Glucose 70 - 140 mg/dL 101 106(H) 124(H)  BUN 7 - 26 mg/dL 18 5(L) <5(L)  Creatinine 0.60 - 1.10 mg/dL 1.22(H) 0.78 0.76  Sodium 136 - 145 mmol/L 138  144 143  Potassium 3.5 - 5.1 mmol/L 4.5 3.9 2.7(LL)  Chloride 98 - 109 mmol/L 107 110 108  CO2 22 - 29 mmol/L 23 27 26   Calcium 8.4 - 10.4 mg/dL 9.5 8.5(L) 8.5(L)  Total Protein 6.4 - 8.3 g/dL 7.8 5.9(L) 6.0(L)  Total Bilirubin 0.2 - 1.2 mg/dL 0.4 0.5 0.5  Alkaline Phos 40 - 150 U/L 92 78 75  AST 5 - 34 U/L 20 19 19   ALT 0 - 55 U/L 14 17 18    PATHOLOGY: [facility] 11/11/16   Diagnosis 11/11/16 Liver, needle/core biopsy, left lobe - METASTATIC ADENOCARCINOMA. - SEE COMMENT. Microscopic Comment The histologic features are consistent with a primary gastrointestinal adenocarcinoma. Dr. Vicente Males has reviewed the case and concurs with this interpretation. There is likely sufficient tumor present for additional studies, if requested. Enid Cutter MD Pathologist,  Electronic Signature (Case signed 11/12/2016)  Diagnosis 09/04/2016 MODERATELY DIFFERENTIATED ADENOCARCINOMA OF COLON AT 20 CM Tumor Site: Colon at 20 cm Tumor Type: Adenocarcinoma Tumore Size: Unknown  Grade: Moderately differentiated   RADIOGRAPHIC STUDIES: I have personally reviewed the radiological images as listed and agreed with the findings in the report.  DG Abdomen 11/28/17 IMPRESSION: Stent within the sigmoid colon. Gaseous distention of bowel above the stent concerning for a degree of bowel obstruction.  CT CAP W Contrast 10/22/17 IMPRESSION: 1. Significant interval improvement and liver metastasis. 2. The left perinephric mass is slightly decreased in size in the interval. 3. No significant interval change in the appearance of multiple pulmonary metastasis. 4. Stable appearance of mediastinal adenopathy. 5. Gallstones 6.  Aortic Atherosclerosis (ICD10-I70.0). 7. Colonic stent remains in place without evidence for bowel obstruction.  CT AP W Contrast 08/07/17 IMPRESSION: Low colonic obstruction secondary to a rectosigmoid stricture at the site of the patient's prior colonic mass, which is no longer  evident on CT. Mild upstream colonic wall thickening raises the possibility of superimposed colitis.Improving hepatic metastases, as above. Left perirenal metastasis, decreased. Visualized pulmonary metastases at the lung bases are grossly unchanged. 3.5 cm mixed cystic/solid left ovarian mass is mildly decreased.  CT Chest 06/06/17  IMPRESSION: 1. There are multiple new and enlarging pulmonary nodules throughout the lungs bilaterally. Additionally, a few of the nodules have decreased in size when compared to prior. 2. Interval increase in size of precarinal lymph node. 3. Interval increase in size of hepatic lesions. 4. Aortic Atherosclerosis (ICD10-I70.0).  CT Abdomen Pelvis, 05/19/2017 IMPRESSION: 1. Worsening pulmonary and hepatic metastatic disease. Primary sigmoid lesion is stable to minimally enlarged. 2. Stable to slight enlargement of a left perinephric mass, likely a metastasis. Renal cell carcinoma is not excluded. Associated pelvocaliectasis versus mild hydronephrosis. 3. Borderline left periaortic lymph nodes, slightly enlarged. 4. Cholelithiasis. 5.  Aortic atherosclerosis (ICD10-170.0). 6. Cystic and solid lesion in the left adnexa, as on prior exams, likely ovarian in origin.  PET 11/06/2016 IMPRESSION: 1. Hypermetabolic pulmonary and hepatic lesions, most consistent with metastatic colon cancer, with the hypermetabolic primary located in the sigmoid colon. 2. Left renal and left adnexal lesions, better seen and evaluated on 10/15/2016. 3. Persistent moderate left hydronephrosis. 4. Aortic atherosclerosis (ICD10-170.0).  MRI Abdomen Pelvis w/wo Contrast 10/15/2016 IMPRESSION: 1. There are 2 enhancing hepatic lesions highly worrisome for metastatic disease, likely metastatic colon cancer. 2. The lesion of concern in the anterior suprahilar lip of the left kidney is also enhancing, worrisome for neoplasm. This has a somewhat atypical appearance for renal cell  carcinoma and could reflect a metastasis as well. 3. New left-sided hydroureter with delayed contrast excretion consistent with distal ureteral obstruction. Specific etiology uncertain. 4. Complex cystic and solid left adnexal lesion could reflect cystic neoplasm, although does not appear aggressive or appear to reflect the cause of the distal left ureteral obstruction. 5. Sigmoid colon wall thickening could be related to reported newly diagnosed colon cancer.  CT CAP w Contrast 09/06/2016 (done outside) IMPRESSION: 1. No acute abdominopelvic process.  2. Heterogenous hypodense area involving the medial edge of the left kidney with multiple small soft tissue like stranding involving the left pararenal space. Findings are suspicious for malignancy of the left kidney with small pararenal metastases. MRI abdomen with contrast should be considered to further evaluate.  3. Vague hypodensity involving the lateral segment of the liver. This can also be reevaluated with MRI.  4. Partially visualized 5 mm posterior right lower lobe  nodular density. CT chest without contrast follow up is recommended.  5. Complex multilobular left ovarian lesion may reflect a cluster of cysts.   PROCEDURES  Flexible Sigmoidoscopy 12/04/17 IMPRESSION - Preparation of the colon was fair. - Stent in the colon. - Stricture in the distal sigmoid colon. Prosthesis placed. - No specimens collected.   Flexible Sigmoidoscopy 08/11/17 IMPRESSION:   - Preparation of the colon was fair. - Likely malignant partially obstructing tumor in the recto-sigmoid colon versus necrotic scarring. Injected with contrast proximal to obstruction to confirm proper position. Prosthesis placed. - Stool in the rectum. - No specimens collected.   Echocardiogram 11/03/16 - Left ventricle: The cavity size was normal. Wall thickness was   increased in a pattern of mild LVH. Indeterminate diastolic   function (atrial fibrillation). Systolic  function was normal. The   estimated ejection fraction was in the range of 60% to 65%.   Operative Report by Dr. Tresa Moore on 10/10/16 Procedure: 1) Cystoscopy, left retrograde pyelogram interpretation. 2) Left diagnostic uteroscopy. 3) Insertion of left uteral stent; 5 x 24 Polaris with tether. Findings: 1) Unremarkable bladder. 2) Unremarkable left retrograde pyelogram. 3) No evidence of intraluminal urothelial neoplasm whatsoever with inspection of the left kidney and ureter times several. 4) Successful placement of left ureteral stent, proximal in renal pelvis and distal in urinary bladder.  Colonoscopy 09/02/2016 FINDINGS: Diverticulosis (scattered). Pt has about 10 cm structure starting at 20 cm with abnormal mucosa. The colonoscopy scope was not able to advance due to structure, and upper endoscopy scope was used and it was advanced through the stricture to see ileocecal valve. Biopsies at the stricture were taken.   ASSESSMENT & PLAN:  Emilina is a 84 y.o. female   1. Adenocarcinoma of left colon, sigmoid colon, TxNxM1 with liver and lung mets, stage IV, KRAS/NRS wild type, MSI-stable  -I previously discussed the imaging, colonoscopy and biopsy results with the patient and her family in detail.  -She has seen Drs. Manny and Gross, surgical resections were discussed.  -PET scan on 11/06/16 showed hypermetabolic pulmonary and hepatic lesions consistent with metastatic colon cancer with the primary located in the sigmoid colon. I reviewed the images with pt and her family members in person  -US biopsy on 11/11/16 of the left lobe of the liver revealed metastatic adenocarcinoma. The features are consistent with a primary gastrointestinal adenocarcinoma. I discussed with pt  -We previously discussed that her metastatic cancer is incurable at this stage due to the multiorgan metastasis, and her advanced age. The goal of therapy is palliative. -We discussed the indication for surgery, including  significant bleeding, obstruction, or perforation. She does have mild constipation and intermittent abdominal cramps, but no significant bowel obstruction. Patient understands the surgery is palliative. She prefers to avoid surgery at this point.  -We reviewed [facility] test results, which showed MSI- stable, no KRAS/NRAS or BRAF mutations. Based on this, she is not a candidate for immunotherapy, but she will likely benefit from EGFR inhibitor, giving the left side colon cancer. --The [facility] results showed she is not a candidate for immunotherapy, but she is a candidate for EGFR antibody therapy. -We discussed that the prognosis of left side colon cancer without KRAS/NRAS and BRAF mutations is much better than right side colon cancer, especially with EGFR antibody treatment. -We discussed the options of systemic therapy. Due to her advanced age, she is not a candidate for intensive chemotherapy. I recommend that panitumumab, an EGFR inhibitor with low intensity chemo (5-FU  infusion or Xeloda to control her cancer. -We discussed alternative management options with palliative care alone, which will be also appropriate given her advanced age and medical comorbidities. -She has recently developed worsening abdominal pain, constipation and nausea, concerning for bowel obstruction from her sigmoid colon cancer. I recommended she increase Miralax and Colace.  - I recommended low residual, low fiber diet, she is tolerating well, her nausea and abdominal discomfort has improved -I reviewed her CT abdomen and pelvis from 05/19/17, which showed disease progression in liver and lung, the primary sigmoid colon mass is stable, no imaging evidence of bowel obstruction. -On 06/06/17 she started first line Vectibix every [redacted] weeks along with oral chemo Xeloda for 1 week on and 1 week off. She has tolerated well overall, except mild skin rash -She was hospitalized on 08/07/17 for bowel obstruction. She had a  sigmoid colon stent placement on 08/11/17 to resolve this. She has recovered well  -Her 08/07/17 CT AP showed great partial response to chemotherapy, no new mets. Given her excellent tolerance to treatment and good response I previously discussed continuing with chemotherapy.  Certainly given her advanced age, if she has more side effects, will likely stop her treatment and switch to comfort care.  We previously had a long conversation about this, and discussed the goal of care again.  Pt finally decided to continue with treatment  -CT CAP from 10/22/17 revealed continue interval improvement in liver and lung metastasis, no new lesions. I discussed these results with the pt today and reviewed the images in person. She is responding well to treatment and I recommend her to continue. She agreed to proceed with treatment.  -Repeat CT scan in 3 months  -She is having a good response to treatment, but does have skin toxicities. Starting 10/31/17 we reduced Xeloda to 2016m in the AM and 15064min PM to reduce her hand foot syndrome.  -She went to ED on 11/07/17 due to significant abdominal pain and had a CT scan which showed no obstruction.  Her pain was probably related to her constipation. I previously discussed increasing her laxative use especially due to her pain medication use. -She has developed worsening abdominal pain and constipation..  I ordered an XR of abdomen to rule out obstruction. XR revealed partial bowel obstruction. Her labs indicated her potassium is 2.9. I will hold IV Vectibix since 11/28/17 and gave IVF with Potassium infusion. Xeloda will be held also.  -I previously instructed patient to change diet to liquids only, such as Carnation breakfast. -I previously called in lactulose every 4 hours until she had a good bowel movement or diarrhea. She was to increase Miralax afterwards -She was admitted to hospital on 12/01/17 and had sigmoidoscopy on 12/04/17 by Dr. MaWatt Climeso place stent.  -If  endoscopy procedure is not able to resolve the obstruction, then surgery with diverting colostomy may be the only option. She is open to that.  -If she recovers well from the obstruction, continuing chemo is an option.  -she understands her cancer is not curable, she values quality of life more, does not want to prolong her life if she has poor quality of life.  -If there is no good options to resolve her bowel obstruction, hospice then would be reasonable.  -Palliative care consult has been request -I reviewed her latest CT scan from 12/01/17 which showed her bowel obstruction as well as good response to treatment in her liver and lung mets.  -I discussed she may have recurrent  bowel obstruction in the future, and he can follow-up with Dr. Watt Climes closely.  Will watch his symptoms, especially constipation and abdominal pain closely.  We also discussed the surgical option of diverting colostomy, she previously saw Dr. Johney Maine, she would like to hold on surgery as long as she can. -I recommend she continue with her systemic therapy given she is responding well. Pt agreed to continue treatment.  She will restart today (12/26/17) -I will scan her again in 2 months. -Pt requested to makes appointments on Thursdays.  -F/u in 2 weeks    2. Constipation, abdominal pain, partial bowel obstruction -Presented to ED on 11/07/17 for significant lower abdominal pain, CT showed no obstruction, improved with return of BMs 2 days later.  -I reviewed laxative use. If she does not have BM for 3 days she should take Miralax 1.5 dose or up to 3-4 times a day until she had BM. If this does not work she can also use Senokot-S.  -She can use Colace 2 tabs BID, but I explained laxities will help her better.  -Pt notes Magnesium citrate caused significant bloating and she does not want to take it again.  --She is currently taking oxycodone 3-4 times a day. I refilled on (11/13/17) -Will continue to monitor.  -She has worsening  abdominal pain from constipation today. She is using 2 cups of Miralax BID. I suspect this could be due to her increased opiate use from the pain. I ordered a XR for 11/28/17 to rule out obstruction. If there is no obstruction I recommend using Lactulose or Magnesium Citrate and a liquid diet for now with ensure or other supplement. I also give Dr. Watt Climes a call to see what he recommends.  -XR revealed partial bowel obstruction. I held treatment since 11/28/17 and could not reach Dr. Watt Climes due to his office being closed. I also called [facility] who I was also unable to reach. She will get IV fluids today and I instructed her to only have a liquid diet. I sent in lactulose laxitive for her to use as instructed and I informed her to go to the ED if she develops worsening pain or vomiting, which are symptoms that her obstruction is becoming worse.  -She was admitted to hospital on 12/01/17 for her bowel obstruction and had sigmoidoscopy by Dr. Watt Climes on 12/04/17 for stent placement.    3. Left renal mass and left hydronephrosis -Possible renal cell carcinoma, metastatic lesion from colon cancer is also a possibility. -She has been seen by [facility], Dr. Tresa Moore, and had left ureter stent placement on 10/10/2016.  -no signs of hydronephrosis on 10/22/17 CT scan, and a left renal mass is stable.  4. Complex cyst of left ovary -will monitor. Likely benign -If she undergoes abdominal surgery, may remove it -She declines surgery for now -08/07/17 CT shows mild decrease in size of cyst -10/22/17 CT shows stable cyst   5. Atrial Fibrillation -The patient has a history of atrial fibrillation. -Her cardiologist wants her to undergo cardio inversion and anticoagulation. -I previously advised the patient that even if she undergoes cardio inversion, her Afib might return. Also, her anticoagulation medication might make her bleeding worse. -The patient should continue taking baby aspirin.  6. HTN -I previously advised the  patient to continue medications and follow up with primary care physician  7. Nutrition -We previously discussed palliative chemotherapy will make her fatigued, nauseous, and loss of appetite. -I previously stressed the importance of a healthy diet with  plenty of protein. -I previously referred to Nutrition.  -Her eating has improved.  8.Goal of care discussion  -We previously discussed the incurable nature of her cancer, and the overall poor prognosis, especially if she does not have good response to chemotherapy or progress on chemo -The patient understands the goal of care is palliative. -I previously recommended DNR/DNI, she will think about it   9. Skin cracks on finger tips, Facial Acne type rash, secondary to chemo and panitumuamb  -Continue hydrocortisone and clindamycin gel for acneiform rash, which is very mild overall. -Eucerin, oatmeal bath, po benadryl PRN and Claritin for body itching -For her hands I previously suggested a vinegar and water soak along with daily neosporin around her nails. I previously recommend she use lotion after she washes her hands and advices her to wear cotton glove to protect her skin. -Her previous Vectibix rash is resolved.  -previously prescribed Urea cream for skin dryness and cracking on her hands. I advised her to wear gloves when doing things with her hands -Her facial acne has improved while off treatment.   10.  Partial bowel obstruction, s/p sigmoid stent placement on 08/11/17 and placed on 12/04/17 -Hospitalized on 08/07/17, and had stent placed on 08/11/17.  -She now had recurrent bowel obstruction as of 11/28/17. She was given lactulose and put on a liquid diet on 11/28/17.  -Without much relief she was admitted to the hospital on 12/01/17 for her bowel obstruction and abdominal pain.  -She had a flexible sigmoidoscopy by Dr. Watt Climes on 12/04/17 and had stent placed.  -If endoscopy procedure is not able to resolve the obstruction, then surgery  with diverting colostomy may be the only option. She is open to that.  -She will see Dr. Watt Climes in 4 months to monitor her stents, or sooner if needed     Plan:  -restart treatment today with Panitumumab, and Xeloda 2000 mg in the morning, 1500 mg in the everning, 7 days on 7 days off, -Lab, flush, f/u and panitumumab in 2 and 4 weeks on Thursdays   All questions were answered. The patient knows to call the clinic with any problems, questions or concerns. I spent a total of 25 minutes for her visit, more than 50% face-to-face counseling.  This document serves as a record of services personally performed by Truitt Merle, MD. It was created on her behalf by Joslyn Devon, a trained medical scribe. The creation of this record is based on the scribe's personal observations and the provider's statements to them.   I have reviewed the above documentation for accuracy and completeness, and I agree with the above.     Truitt Merle, MD 12/26/2017 9:34 AM

## 2017-12-26 ENCOUNTER — Encounter: Payer: Self-pay | Admitting: Hematology

## 2017-12-26 ENCOUNTER — Inpatient Hospital Stay: Payer: Medicare Other

## 2017-12-26 ENCOUNTER — Telehealth: Payer: Self-pay | Admitting: Hematology

## 2017-12-26 ENCOUNTER — Ambulatory Visit: Payer: Medicare Other

## 2017-12-26 ENCOUNTER — Inpatient Hospital Stay (HOSPITAL_BASED_OUTPATIENT_CLINIC_OR_DEPARTMENT_OTHER): Payer: Medicare Other | Admitting: Hematology

## 2017-12-26 ENCOUNTER — Inpatient Hospital Stay: Payer: Medicare Other | Attending: Hematology

## 2017-12-26 ENCOUNTER — Other Ambulatory Visit: Payer: Medicare Other

## 2017-12-26 ENCOUNTER — Ambulatory Visit: Payer: Medicare Other | Admitting: Hematology

## 2017-12-26 VITALS — BP 110/64

## 2017-12-26 VITALS — BP 147/73 | HR 79 | Temp 97.9°F | Resp 17 | Ht 66.0 in | Wt 173.9 lb

## 2017-12-26 DIAGNOSIS — I10 Essential (primary) hypertension: Secondary | ICD-10-CM | POA: Diagnosis not present

## 2017-12-26 DIAGNOSIS — Z7189 Other specified counseling: Secondary | ICD-10-CM | POA: Diagnosis not present

## 2017-12-26 DIAGNOSIS — I4891 Unspecified atrial fibrillation: Secondary | ICD-10-CM | POA: Diagnosis not present

## 2017-12-26 DIAGNOSIS — Z5112 Encounter for antineoplastic immunotherapy: Secondary | ICD-10-CM | POA: Diagnosis not present

## 2017-12-26 DIAGNOSIS — C186 Malignant neoplasm of descending colon: Secondary | ICD-10-CM

## 2017-12-26 DIAGNOSIS — C7800 Secondary malignant neoplasm of unspecified lung: Secondary | ICD-10-CM

## 2017-12-26 DIAGNOSIS — C187 Malignant neoplasm of sigmoid colon: Secondary | ICD-10-CM | POA: Diagnosis not present

## 2017-12-26 DIAGNOSIS — C787 Secondary malignant neoplasm of liver and intrahepatic bile duct: Secondary | ICD-10-CM

## 2017-12-26 LAB — CBC WITH DIFFERENTIAL (CANCER CENTER ONLY)
Basophils Absolute: 0 10*3/uL (ref 0.0–0.1)
Basophils Relative: 0 %
EOS PCT: 5 %
Eosinophils Absolute: 0.4 10*3/uL (ref 0.0–0.5)
HEMATOCRIT: 36.6 % (ref 34.8–46.6)
Hemoglobin: 11.7 g/dL (ref 11.6–15.9)
LYMPHS ABS: 2 10*3/uL (ref 0.9–3.3)
LYMPHS PCT: 28 %
MCH: 31.2 pg (ref 25.1–34.0)
MCHC: 32 g/dL (ref 31.5–36.0)
MCV: 97.6 fL (ref 79.5–101.0)
MONO ABS: 1.2 10*3/uL — AB (ref 0.1–0.9)
Monocytes Relative: 17 %
NEUTROS ABS: 3.6 10*3/uL (ref 1.5–6.5)
Neutrophils Relative %: 50 %
Platelet Count: 387 10*3/uL (ref 145–400)
RBC: 3.75 MIL/uL (ref 3.70–5.45)
RDW: 16.9 % — AB (ref 11.2–14.5)
WBC Count: 7.2 10*3/uL (ref 3.9–10.3)

## 2017-12-26 LAB — CMP (CANCER CENTER ONLY)
ALBUMIN: 3.5 g/dL (ref 3.5–5.0)
ALT: 14 U/L (ref 0–55)
ANION GAP: 8 (ref 3–11)
AST: 20 U/L (ref 5–34)
Alkaline Phosphatase: 92 U/L (ref 40–150)
BUN: 18 mg/dL (ref 7–26)
CHLORIDE: 107 mmol/L (ref 98–109)
CO2: 23 mmol/L (ref 22–29)
Calcium: 9.5 mg/dL (ref 8.4–10.4)
Creatinine: 1.22 mg/dL — ABNORMAL HIGH (ref 0.60–1.10)
GFR, Est AFR Am: 47 mL/min — ABNORMAL LOW (ref >60)
GFR, Estimated: 40 mL/min — ABNORMAL LOW (ref >60)
GLUCOSE: 101 mg/dL (ref 70–140)
POTASSIUM: 4.5 mmol/L (ref 3.5–5.1)
SODIUM: 138 mmol/L (ref 136–145)
TOTAL PROTEIN: 7.8 g/dL (ref 6.4–8.3)
Total Bilirubin: 0.4 mg/dL (ref 0.2–1.2)

## 2017-12-26 LAB — CEA (IN HOUSE-CHCC): CEA (CHCC-In House): 2.75 ng/mL (ref 0.00–5.00)

## 2017-12-26 MED ORDER — SODIUM CHLORIDE 0.9 % IV SOLN
Freq: Once | INTRAVENOUS | Status: AC
  Administered 2017-12-26: 10:00:00 via INTRAVENOUS

## 2017-12-26 MED ORDER — SODIUM CHLORIDE 0.9 % IV SOLN
6.0000 mg/kg | Freq: Once | INTRAVENOUS | Status: AC
  Administered 2017-12-26: 500 mg via INTRAVENOUS
  Filled 2017-12-26: qty 20

## 2017-12-26 MED ORDER — SODIUM CHLORIDE 0.9% FLUSH
10.0000 mL | INTRAVENOUS | Status: DC | PRN
  Filled 2017-12-26: qty 10

## 2017-12-26 MED ORDER — SODIUM CHLORIDE 0.9 % IV SOLN
Freq: Once | INTRAVENOUS | Status: AC
  Administered 2017-12-26: 09:00:00 via INTRAVENOUS

## 2017-12-26 MED ORDER — HEPARIN SOD (PORK) LOCK FLUSH 100 UNIT/ML IV SOLN
500.0000 [IU] | Freq: Once | INTRAVENOUS | Status: DC | PRN
  Filled 2017-12-26: qty 5

## 2017-12-26 NOTE — Patient Instructions (Signed)
[facility] Discharge Instructions for Patients Receiving Chemotherapy  Today you received the following chemotherapy agents: Vectibix.  To help prevent nausea and vomiting after your treatment, we encourage you to take your nausea medication as directed.   If you develop nausea and vomiting that is not controlled by your nausea medication, call the clinic.   BELOW ARE SYMPTOMS THAT SHOULD BE REPORTED IMMEDIATELY:  *FEVER GREATER THAN 100.5 F  *CHILLS WITH OR WITHOUT FEVER  NAUSEA AND VOMITING THAT IS NOT CONTROLLED WITH YOUR NAUSEA MEDICATION  *UNUSUAL SHORTNESS OF BREATH  *UNUSUAL BRUISING OR BLEEDING  TENDERNESS IN MOUTH AND THROAT WITH OR WITHOUT PRESENCE OF ULCERS  *URINARY PROBLEMS  *BOWEL PROBLEMS  UNUSUAL RASH Items with * indicate a potential emergency and should be followed up as soon as possible.  Feel free to call the clinic should you have any questions or concerns. The clinic phone number is [phone].  Please show the CHEMO ALERT CARD at check-in to the Emergency Department and triage nurse.   

## 2017-12-26 NOTE — Telephone Encounter (Signed)
Scheduled appt per 5/17 los - gave patient AVS and calender per los.

## 2018-01-08 NOTE — Progress Notes (Signed)
[place]  [phone] [phone]  Clinic Follow Up Note   Patient Care Team: Moshe Cipro, MD as PCP - General (Internal Medicine) Alexis Frock, MD as Consulting Physician (Urology) Michael Boston, MD as Consulting Physician (General Surgery) Toma Deiters, MD as Referring Physician (Gastroenterology)   Date of Service:  01/09/2018  CHIEF COMPLAINTS:  Follow up Metastatic sigmoid colon Adenocarcinoma  Oncology History   Cancer Staging Cancer of left colon [facility]) Staging form: Colon and Rectum, AJCC 8th Edition - Clinical stage from 09/04/2016: Stage IVB (cTX, cNX, pM1b) - Signed by Truitt Merle, MD on 11/12/2016       Cancer of sigmoid colon ([place])   09/02/2016 Procedure    Colonoscopy Diverticulosis (scattered). Pt has about 10 cm structure starting at 20 cm with abnormal mucosa. Biopsies taken.       09/02/2016 Pathology Results    MODERATELY DIFFERENTIATED ADENOCARCINOMA OF COLON AT 20 CM      09/04/2016 Initial Diagnosis    Cancer of left colon ([place])      09/06/2016 Imaging    CT CAP w Contrast 1. No acute abdominopelvic process.  2. Heterogenous hypodense area involving the medial edge of the left kidney with multiple small soft tissue like stranding involving the left pararenal space. Findings are suspicious for malignancy of the left kidney with small pararenal metastases. MRI abdomen with contrast should be considered to further evaluate.  3. Vague hypodensity involving the lateral segment of the liver. This can also be reevaluated with MRI.  4. Partially visualized 5 mm posterior right lower lobe nodular density. CT chest without contrast follow up is recommended.  5. Complex multilobular left ovarian lesion may reflect a cluster of cysts.      10/10/2016 Procedure    Operative Report by Dr. Tresa Moore on 10/18/16 Procedure: 1) Cystoscopy, left retrograde pyelogram interpretation. 2) Left diagnostic uteroscopy. 3) Insertion of  left uteral stent; 5 x 24 Polaris with tether. Findings: 1) Unremarkable bladder. 2) Unremarkable left retrograde pyelogram. 3) No evidence of intraluminal urothelial neoplasm whatsoever with insepction of the left kidney and ureter times several. 4) Successful placement of left ureteral stent, proximal in renal pelvis and distal in urinary bladder.      10/15/2016 Imaging    MRI abdomen pelvis 1. There are 2 enhancing hepatic lesions highly worrisome for metastatic disease, likely metastatic colon cancer. 2. The lesion of concern in the anterior suprahilar lip of the left kidney is also enhancing, worrisome for neoplasm. This has a somewhat atypical appearance for renal cell carcinoma and could reflect a metastasis as well. 3. New left-sided hydroureter with delayed contrast excretion consistent with distal ureteral obstruction. Specific etiology uncertain. 4. Complex cystic and solid left adnexal lesion could reflect cystic neoplasm, although does not appear aggressive or appear to reflect the cause of the distal left ureteral obstruction. 5. Sigmoid colon wall thickening could be related to reported newly diagnosed colon cancer.      11/06/2016 PET scan    PET 11/06/2016 IMPRESSION: 1. Hypermetabolic pulmonary and hepatic lesions, most consistent with metastatic colon cancer, with the hypermetabolic primary located in the sigmoid colon. 2. Left renal and left adnexal lesions, better seen and evaluated on 10/15/2016. 3. Persistent moderate left hydronephrosis. 4. Aortic atherosclerosis (ICD10-170.0).      11/11/2016 Pathology Results    Liver, needle/core biopsy, left lobe - METASTATIC ADENOCARCINOMA. The histologic features are consistent with a primary gastrointestinal adenocarcinoma.      05/19/2017 Imaging  CT Abdomen Pelvis, 05/19/2017 IMPRESSION: 1. Worsening pulmonary and hepatic metastatic disease. Primary sigmoid lesion is stable to minimally enlarged. 2. Stable  to slight enlargement of a left perinephric mass, likely a metastasis. Renal cell carcinoma is not excluded. Associated pelvocaliectasis versus mild hydronephrosis. 3. Borderline left periaortic lymph nodes, slightly enlarged. 4. Cholelithiasis. 5.  Aortic atherosclerosis (ICD10-170.0). 6. Cystic and solid lesion in the left adnexa, as on prior exams, likely ovarian in origin.      06/06/2017 -  Chemotherapy    IV antibody Vectibix every [redacted] weeks along with oral chemo Xeloda for 1 week on and 1 week off starting 06/06/17.   For cycle 2 she had 2029m Xeloda for 10 days and then 10 days off. Return to 1 week on and 1 week off with starting with Cycle 3. Held after 07/24/17 due to hospitalization for SBO. Restarted Xeloda and Vectibix on 09/04/17      08/07/2017 - 08/16/2017 Hospital Admission    Admit date: 08/07/17 Admission diagnosis: Bowel Obstruction  Additional comments: She was admitted to the hospital on 08/07/17 [facility] due to a bowel obstruction.  She had a sigmoid colon stent placement on 08/11/17. Bowel obstruction resolved.  CT scan in hospital showed great partial response to chemotherapy. She will continue chemotherapy given good overall tolerance.        08/07/2017 Imaging    CT AP W Contrast 08/07/17 IMPRESSION: Low colonic obstruction secondary to a rectosigmoid stricture at the site of the patient's prior colonic mass, which is no longer evident on CT. Mild upstream colonic wall thickening raises the possibility of superimposed colitis.  Improving hepatic metastases, as above. Left perirenal metastasis, decreased. Visualized pulmonary metastases at the lung bases are grossly unchanged.  3.5 cm mixed cystic/solid left ovarian mass is mildly decreased.       08/11/2017 Procedure    By Dr. MWatt Climes Flexible Sigmoidoscopy 08/11/17 IMPRESSION:   - Preparation of the colon was fair. - Likely malignant partially obstructing  tumor in the recto-sigmoid colon versus necrotic scarring. Injected with contrast proximal to obstruction to confirm proper position. Prosthesis placed. - Stool in the rectum. - No specimens collected.       10/22/2017 Imaging    CT CAP W Contrast 10/22/17 IMPRESSION: 1. Significant interval improvement and liver metastasis. 2. The left perinephric mass is slightly decreased in size in the interval. 3. No significant interval change in the appearance of multiple pulmonary metastasis. 4. Stable appearance of mediastinal adenopathy. 5. Gallstones 6.  Aortic Atherosclerosis (ICD10-I70.0). 7. Colonic stent remains in place without evidence for bowel obstruction.      12/01/2017 - 12/07/2017 Hospital Admission    Admit date: 12/01/17 Admission diagnosis: Bowel Obstruction and abdominal pain Additional comments: had flexible sigmoidoscopy by Dr. MWatt Climeson 12/04/17. Chemo was held during this time.       12/04/2017 Procedure    Flexible Sigmoidoscopy 12/04/17 IMPRESSION - Preparation of the colon was fair. - Stent in the colon. - Stricture in the distal sigmoid colon. Prosthesis placed. - No specimens collected.      HISTORY OF PRESENTING ILLNESS (10/25/2016):  ASULTANA TIERNEY89y.o. female is here because of adenocarcinoma of the colon and a left renal mass. The pt noticed worsening episodes of abdominal cramping and nausea with bowel movements in July 2017. She had an abdominal UK[place]aperformed, which was unremarkable. A colonoscopy was then performed at the patient's request, which found a mass about 20 cm  from the anal verge. Biopsy showed moderately differentiated adenocarcinoma. MRI done, with a lesion noticed in the left kidney near the pelvis, a 11 mm hepatic lesion, 4 mm lung nodule, and possible cystic left ovarian mass. Pt saw Dr. Johney Maine for information on abdominal surgery for her colon, which she declined. She also saw Dr. Lorna Dibble, who is considering a nephrectomy, but is planning a  sternoscopy to better characterize the kidney lesion. She presents today for continued treatment.    She presents today with her son and daughter-in-law. The symptoms started in late July 2017. She had some vomiting, abdominal pain, and constipation, but not bleeding. Her PCP never found any anemia. Initially she lost her appetite, but now she is taking multiple vitamins and supplements, so her appetite is back. Her bowel has improved, but she still has abdominal pain that comes and goes, lasting minutes. The pain is tolerable with aleve. She did not tolerate Tramadol well; nausea and vomiting. She has some occasional left sides back pain. Denies weight loss, or any other concerns. She is able to do everything she needs to around the house.   She had a partial hysterectomy, and a lumpectomy. She has a history of HTN, atrial fibrillation, high cholesterol. She has some hearing loss and can not see out of her right eye. Not a diabetic, but her son and her mother are. No family history of colon cancer. Her sister had breast cancer in her 41's and her brother had lung cancer in his 75's. Never smoker. Doesn't drink alcohol. 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 She has two sons; one lives an hour away, and the other lives in [place]. She lives alone.   CURRENT THERAPY:   IV antibody Vectibix every [redacted] weeks along with oral chemo Xeloda for 1 week on and 1 week off starting 06/06/17. Due to skin toxicities we reduced Xeloda to 2000 mg in the AM and 1549m in the PM 1 week on and 1 week off on 10/31/17. She had multiple treatment break due to hospitalization. Restarted treatment on 12/26/17. Chemo break to start on 01/16/18.  INTERVAL HISTORY:   AEMILYGRACE GROTHEis here for a follow up and treatment. She presents to the clinic today accompanied by her daughter-in-law. She notes she is doing well. She denies having pain. She notes she is able to do everything she wants to do. She is barely taking antiemetics. She  notes her last treatment went fine except the nurses had a hard time getting her needle in. She notes she does not want a port but will think about it.      MEDICAL HISTORY:  Past Medical History:  Diagnosis Date  . Aneurysm of ascending aorta ([facility]) 12/03/2016   3.7 cm by echo 10/2016  . Colon cancer ([facility]    dx via biospy from colonoscopy-- malignant ---  currently having work-up done w/ dr gross (general surgeon) and coordinate surgery w/ urologsit for renal tumor  . Essential hypertension 10/31/2016  . GERD (gastroesophageal reflux disease)   . Glaucoma   . Hiatal hernia   . HOH (hard of hearing)    bilateral even w/ hearing aids  . Hyperlipidemia   . Kidney tumor    left side  . Legally blind in right [place], [place]   due to macular degeneration  . Macular degeneration   . Nausea    intermittant due to colon mass  . Ovarian cyst   . [place]istent [place]  10/31/2016   on ASA only  . PONV (postoperative nausea and vomiting)   . Wears glasses   . Wears hearing aid    bilateral    SURGICAL HISTORY: Past Surgical History:  Procedure Laterality Date  . BREAST CYST EXCISION  1990   benign  . COLONIC STENT PLACEMENT N/A 12/04/2017   Procedure: COLONIC STENT PLACEMENT;  Surgeon: Clarene Essex, MD;  Location: [facility];  Service: Endoscopy;  Laterality: N/A;  . COLONOSCOPY  09/04/2016  . CYSTOSCOPY WITH RETROGRADE PYELOGRAM, URETEROSCOPY AND STENT PLACEMENT Left 10/10/2016   Procedure: CYSTOSCOPY WITH RETROGRADE PYELOGRAM, DIAGNOSTIC URETEROSCOPY  AND STENT PLACEMENT;  Surgeon: Alexis Frock, MD;  Location: [facility];  Service: Urology;  Laterality: Left;  . ESOPHAGOGASTRODUODENOSCOPY  11/07/2014  . FLEXIBLE SIGMOIDOSCOPY N/A 08/11/2017   Procedure: FLEXIBLE SIGMOIDOSCOPY;  Surgeon: Clarene Essex, MD;  Location: [facility];  Service: Endoscopy;  Laterality: N/A;  With Fluoroscopy for colonic stent placement for colonic stricture  .  FLEXIBLE SIGMOIDOSCOPY N/A 12/04/2017   Procedure: FLEXIBLE SIGMOIDOSCOPY;  Surgeon: Clarene Essex, MD;  Location: [facility];  Service: Endoscopy;  Laterality: N/A;  . NASAL SINUS SURGERY    . TONSILLECTOMY      SOCIAL HISTORY: Social History   Socioeconomic History  . Marital status: Widowed    Spouse name: Not on file  . Number of children: Not on file  . Years of education: Not on file  . Highest education level: Not on file  Occupational History  . Not on file  Social Needs  . Financial resource strain: Not on file  . Food insecurity:    Worry: Not on file    Inability: Not on file  . Transportation needs:    Medical: Not on file    Non-medical: Not on file  Tobacco Use  . Smoking status: Never Smoker  . Smokeless tobacco: Never Used  Substance and Sexual Activity  . Alcohol use: No  . Drug use: No  . Sexual activity: Not on file  Lifestyle  . Physical activity:    Days per week: Not on file    Minutes per session: Not on file  . Stress: Not on file  Relationships  . Social connections:    Talks on phone: Not on file    Gets together: Not on file    Attends religious service: Not on file    Active member of club or organization: Not on file    Attends meetings of clubs or organizations: Not on file    Relationship status: Not on file  . Intimate partner violence:    Fear of current or ex partner: Not on file    Emotionally abused: Not on file    Physically abused: Not on file    Forced sexual activity: Not on file  Other Topics Concern  . Not on file  Social History Narrative  . Not on file    FAMILY HISTORY: Family History  Problem Relation Age of Onset  . Cancer Sister 22       breast cancer   . Cancer Brother 43       lung cancer   . Diabetes Son   . High blood pressure Son   . Peripheral Artery Disease Mother   . High blood pressure Mother     ALLERGIES:  is allergic to benazepril; tylenol [acetaminophen]; and bactrim  [sulfamethoxazole-trimethoprim].  MEDICATIONS:  Current Outpatient Medications  Medication Sig Dispense Refill  . amLODipine (NORVASC) 5 MG tablet Take  1 tablet (5 mg total) by mouth daily. 90 tablet 1  . Bisacodyl (DULCOLAX PO) Take 2 tablets by mouth 2 (two) times daily.    . bisacodyl (DULCOLAX) 10 MG suppository Place 10 mg rectally as needed for moderate constipation.    . capecitabine (XELODA) 500 MG tablet Take 4 tablets ( 2000 mg ) in  AM ;  3 tablets ( 1500 mg ) in  PM for  7 days on ,  Off 7 days. 98 tablet 2  . diltiazem (CARDIZEM CD) 240 MG 24 hr capsule Take 1 capsule (240 mg total) by mouth daily. 90 capsule 1  . escitalopram (LEXAPRO) 10 MG tablet Take 10 mg by mouth daily.    . fexofenadine (ALLEGRA) 180 MG tablet Take 180 mg by mouth daily.    Marland Kitchen latanoprost (XALATAN) 0.005 % ophthalmic solution Place 1 drop into both eyes at bedtime.    Marland Kitchen losartan (COZAAR) 100 MG tablet Take 1 tablet (100 mg total) by mouth daily. 90 tablet 0  . Multiple Vitamins-Minerals (VITEYES AREDS ADVANCED) CAPS Take 1 capsule by mouth daily.    Marland Kitchen omeprazole (PRILOSEC) 40 MG capsule Take 40 mg by mouth daily.    . ondansetron (ZOFRAN ODT) 4 MG disintegrating tablet Take 1 tablet (4 mg total) by mouth every 8 (eight) hours as needed for nausea or vomiting. 30 tablet 0  . oxyCODONE (OXY IR/ROXICODONE) 5 MG immediate release tablet Take 1 tablet (5 mg total) by mouth every 6 (six) hours as needed for moderate pain or severe pain. 90 tablet 0  . Polyethyl Glycol-Propyl Glycol (SYSTANE) 0.4-0.3 % SOLN Apply 1 drop to eye daily as needed (dry eyes).     . polyethylene glycol (MIRALAX / GLYCOLAX) packet Take 17 g by mouth daily as needed for moderate constipation. 30 each 0  . Potassium Chloride ER 20 MEQ TBCR Take 20 mEq by mouth daily. 30 tablet 0  . sennosides-docusate sodium (SENOKOT-S) 8.6-50 MG tablet Take 1-2 tablets by mouth 3 (three) times daily.    . urea (CARMOL) 10 % cream Apply topically as needed.  71 g 0   Current Facility-Administered Medications  Medication Dose Route Frequency Provider Last Rate Last Dose  . sodium chloride flush (NS) 0.9 % injection 3 mL  3 mL Intravenous PRN Skeet Latch, MD        REVIEW OF SYSTEMS:   Constitutional: Denies fevers, chills or abnormal  (+) improved energy Eyes: Denies blurriness of vision, double vision or watery eyes Ears, nose, mouth, throat, and face: Denies mucositis or sore throat Respiratory: Denies cough, dyspnea or wheezes Cardiovascular: Denies palpitation, chest discomfort  Gastrointestinal:  Denies heartburn   Skin: Denies rashes  Lymphatics: Denies new lymphadenopathy or easy bruising MSK: negative Neurological:Denies numbness, tingling or new weaknesses Behavioral/Psych: Mood is stable, no new changes  All other systems were reviewed with the patient and are negative.  PHYSICAL EXAMINATION:  ECOG PERFORMANCE STATUS: 1 - Symptomatic but completely ambulatory   Vitals:   01/09/18 0850  BP: 118/70  Pulse: 78  Resp: 18  Temp: 98.3 F (36.8 C)  TempSrc: Oral  SpO2: 99%  Weight: 168 lb 9.6 oz (76.5 kg)  Height: 5' 6"  (1.676 m)     GENERAL:alert, no distress and comfortable SKIN: skin color, texture, turgor are normal  EYES: normal, conjunctiva are pink and non-injected, sclera clear OROPHARYNX:no exudate, no erythema and lips, buccal mucosa, and tongue normal  NECK: supple, thyroid normal size, non-tender, without nodularity LYMPH:  no palpable lymphadenopathy in the cervical, axillary or inguinal LUNGS: clear to auscultation and percussion with normal breathing effort HEART: regular rate & rhythm and no murmurs and (+) symmetrical lower extremity edema, resolved ABDOMEN:abdomen soft, non-tender (+) active bowel sounds Musculoskeletal:no cyanosis of digits and no clubbing  PSYCH: alert & oriented x 3 with fluent speech NEURO: no focal motor/sensory deficits   LABORATORY DATA:  I have reviewed the data as  listed CBC Latest Ref Rng & Units 01/09/2018 12/26/2017 12/07/2017  WBC 3.9 - 10.3 K/uL 9.2 7.2 10.5  Hemoglobin 11.6 - 15.9 g/dL 11.9 11.7 12.1  Hematocrit 34.8 - 46.6 % 37.3 36.6 37.5  Platelets 145 - 400 K/uL 337 387 422(H)   CMP Latest Ref Rng & Units 01/09/2018 12/26/2017 12/07/2017  Glucose 70 - 140 mg/dL 78 101 106(H)  BUN 7 - 26 mg/dL 16 18 5(L)  Creatinine 0.60 - 1.10 mg/dL 0.87 1.22(H) 0.78  Sodium 136 - 145 mmol/L 137 138 144  Potassium 3.5 - 5.1 mmol/L 3.9 4.5 3.9  Chloride 98 - 109 mmol/L 105 107 110  CO2 22 - 29 mmol/L 23 23 27   Calcium 8.4 - 10.4 mg/dL 9.1 9.5 8.5(L)  Total Protein 6.4 - 8.3 g/dL 7.4 7.8 5.9(L)  Total Bilirubin 0.2 - 1.2 mg/dL 0.6 0.4 0.5  Alkaline Phos 40 - 150 U/L 88 92 78  AST 5 - 34 U/L 18 20 19   ALT 0 - 55 U/L 13 14 17    PATHOLOGY: [facility] 11/11/16   Diagnosis 11/11/16 Liver, needle/core biopsy, left lobe - METASTATIC ADENOCARCINOMA. - SEE COMMENT. Microscopic Comment The histologic features are consistent with a primary gastrointestinal adenocarcinoma. Dr. Vicente Males has reviewed the case and concurs with this interpretation. There is likely sufficient tumor present for additional studies, if requested. Enid Cutter MD Pathologist, Electronic Signature (Case signed 11/12/2016)  Diagnosis 09/04/2016 MODERATELY DIFFERENTIATED ADENOCARCINOMA OF COLON AT 20 CM Tumor Site: Colon at 20 cm Tumor Type: Adenocarcinoma Tumore Size: Unknown  Grade: Moderately differentiated   RADIOGRAPHIC STUDIES: I have personally reviewed the radiological images as listed and agreed with the findings in the report.  DG Abdomen 11/28/17 IMPRESSION: Stent within the sigmoid colon. Gaseous distention of bowel above the stent concerning for a degree of bowel obstruction.  CT CAP W Contrast 10/22/17 IMPRESSION: 1. Significant interval improvement and liver metastasis. 2. The left perinephric mass is slightly decreased in size in the interval. 3. No  significant interval change in the appearance of multiple pulmonary metastasis. 4. Stable appearance of mediastinal adenopathy. 5. Gallstones 6.  Aortic Atherosclerosis (ICD10-I70.0). 7. Colonic stent remains in place without evidence for bowel obstruction.  CT AP W Contrast 08/07/17 IMPRESSION: Low colonic obstruction secondary to a rectosigmoid stricture at the site of the patient's prior colonic mass, which is no longer evident on CT. Mild upstream colonic wall thickening raises the possibility of superimposed colitis.Improving hepatic metastases, as above. Left perirenal metastasis, decreased. Visualized pulmonary metastases at the lung bases are grossly unchanged. 3.5 cm mixed cystic/solid left ovarian mass is mildly decreased.  CT Chest 06/06/17  IMPRESSION: 1. There are multiple new and enlarging pulmonary nodules throughout the lungs bilaterally. Additionally, a few of the nodules have decreased in size when compared to prior. 2. Interval increase in size of precarinal lymph node. 3. Interval increase in size of hepatic lesions. 4. Aortic Atherosclerosis (ICD10-I70.0).  CT Abdomen Pelvis, 05/19/2017 IMPRESSION: 1. Worsening pulmonary and hepatic metastatic disease. Primary sigmoid lesion is stable to minimally enlarged. 2. Stable  to slight enlargement of a left perinephric mass, likely a metastasis. Renal cell carcinoma is not excluded. Associated pelvocaliectasis versus mild hydronephrosis. 3. Borderline left periaortic lymph nodes, slightly enlarged. 4. Cholelithiasis. 5.  Aortic atherosclerosis (ICD10-170.0). 6. Cystic and solid lesion in the left adnexa, as on prior exams, likely ovarian in origin.  PET 11/06/2016 IMPRESSION: 1. Hypermetabolic pulmonary and hepatic lesions, most consistent with metastatic colon cancer, with the hypermetabolic primary located in the sigmoid colon. 2. Left renal and left adnexal lesions, better seen and evaluated on 10/15/2016. 3.  Persistent moderate left hydronephrosis. 4. Aortic atherosclerosis (ICD10-170.0).  MRI Abdomen Pelvis w/wo Contrast 10/15/2016 IMPRESSION: 1. There are 2 enhancing hepatic lesions highly worrisome for metastatic disease, likely metastatic colon cancer. 2. The lesion of concern in the anterior suprahilar lip of the left kidney is also enhancing, worrisome for neoplasm. This has a somewhat atypical appearance for renal cell carcinoma and could reflect a metastasis as well. 3. New left-sided hydroureter with delayed contrast excretion consistent with distal ureteral obstruction. Specific etiology uncertain. 4. Complex cystic and solid left adnexal lesion could reflect cystic neoplasm, although does not appear aggressive or appear to reflect the cause of the distal left ureteral obstruction. 5. Sigmoid colon wall thickening could be related to reported newly diagnosed colon cancer.  CT CAP w Contrast 09/06/2016 (done outside) IMPRESSION: 1. No acute abdominopelvic process.  2. Heterogenous hypodense area involving the medial edge of the left kidney with multiple small soft tissue like stranding involving the left pararenal space. Findings are suspicious for malignancy of the left kidney with small pararenal metastases. MRI abdomen with contrast should be considered to further evaluate.  3. Vague hypodensity involving the lateral segment of the liver. This can also be reevaluated with MRI.  4. Partially visualized 5 mm posterior right lower lobe nodular density. CT chest without contrast follow up is recommended.  5. Complex multilobular left ovarian lesion may reflect a cluster of cysts.   PROCEDURES  Flexible Sigmoidoscopy 12/04/17 IMPRESSION - Preparation of the colon was fair. - Stent in the colon. - Stricture in the distal sigmoid colon. Prosthesis placed. - No specimens collected.   Flexible Sigmoidoscopy 08/11/17 IMPRESSION:   - Preparation of the colon was fair. - Likely  malignant partially obstructing tumor in the recto-sigmoid colon versus necrotic scarring. Injected with contrast proximal to obstruction to confirm proper position. Prosthesis placed. - Stool in the rectum. - No specimens collected.   Echocardiogram 11/03/16 - Left ventricle: The cavity size was normal. Wall thickness was   increased in a pattern of mild LVH. Indeterminate diastolic   function (atrial fibrillation). Systolic function was normal. The   estimated ejection fraction was in the range of 60% to 65%.   Operative Report by Dr. Tresa Moore on 10/10/16 Procedure: 1) Cystoscopy, left retrograde pyelogram interpretation. 2) Left diagnostic uteroscopy. 3) Insertion of left uteral stent; 5 x 24 Polaris with tether. Findings: 1) Unremarkable bladder. 2) Unremarkable left retrograde pyelogram. 3) No evidence of intraluminal urothelial neoplasm whatsoever with inspection of the left kidney and ureter times several. 4) Successful placement of left ureteral stent, proximal in renal pelvis and distal in urinary bladder.  Colonoscopy 09/02/2016 FINDINGS: Diverticulosis (scattered). Pt has about 10 cm structure starting at 20 cm with abnormal mucosa. The colonoscopy scope was not able to advance due to structure, and upper endoscopy scope was used and it was advanced through the stricture to see ileocecal valve. Biopsies at the stricture were taken.   ASSESSMENT & PLAN:  Chantia is a 84 y.o. female   1. Adenocarcinoma of left colon, sigmoid colon, TxNxM1 with liver and lung mets, stage IV, KRAS/NRS wild type, MSI-stable  -We previously discussed her incurable nature of metastatic colon cancer, and the atypical of therapy. -He has been on first-line chemotherapy with chemotherapy and Panitumumab, tolerating very well overall, and has had partial response. -However she has developed recurrent bowel obstruction secondary to the sigmoid colon mass, status post colonic stent placement twice by Dr.  Watt Climes. -I reviewed her latest CT scan from 12/01/17 which showed her bowel obstruction as well as good response to treatment in her liver and lung mets.  -I discussed she may have recurrent bowel obstruction in the future, and he can follow-up with Dr. Watt Climes closely. Will watch her symptoms, especially constipation and abdominal pain closely. We also discussed the surgical option of diverting colostomy, she previously saw Dr. Johney Maine, she would like to hold on surgery as long as she can.  -I recommend she continue with her systemic therapy given she is responding well. Pt agreed to continue treatment.  She restarted on 12/26/17 -Labs reviewed, CBC and CMP overall in normal limits. Cr and potassium normalized. Labs adequate to proceed with Vectibix today and Xeloda this week.  -Patient be feeling well since he chronically stands placement, and would like to take a chemo break, she is thinking about stopping chemo  -She will proceed with a chemo break in June and return in mid- July with a scan.  -She will decide after next scan to continue with treatment or stop and proceed with observation.  -F/un in 6 weeks.    2. Constipation, abdominal pain, partial bowel obstruction  -Presented to ED on 11/07/17 for significant lower abdominal pain, CT showed no obstruction, improved with return of BMs 2 days later.  -I reviewed laxative use. If she does not have BM for 3 days she should take Miralax 1.5 dose or up to 3-4 times a day until she had BM. If this does not work she can also use Senokot-S.  -She can use Colace 2 tabs BID, but I explained laxities will help her better.  -Pt notes Magnesium citrate caused significant bloating and she does not want to take it again.  --She is currently taking oxycodone 3-4 times a day. I refilled on (11/13/17) -Will continue to monitor.  -She has worsening abdominal pain from constipation on previously. She is using 2 cups of Miralax BID. I suspected this could be due to her  increased opiate use from the pain. -XR from 11/28/17 revealed partial bowel obstruction. I held treatment since 11/28/17. As I could not reach her GI, she was given IV fluids, lactulose laxative for her to use as instructed and I informed her to go to the ED if she developed worsening pain or vomiting, which are symptoms that her obstruction is becoming worse.  -She was admitted to hospital on 12/01/17 for her bowel obstruction and had sigmoidoscopy by Dr. Watt Climes on 12/04/17 for stent placement.  -Her pain is currently resolved and her bowels are doing well.    3. Left renal mass and left hydronephrosis -Possible renal cell carcinoma, metastatic lesion from colon cancer is also a possibility. -She has been seen by [facility], Dr. Tresa Moore, and had left ureter stent placement on 10/10/2016.  -no signs of hydronephrosis on 10/22/17 CT scan, and a left renal mass is stable.  4. Complex cyst of left ovary -will monitor. Likely benign -If she undergoes abdominal surgery,  may remove it -She declines surgery for now -08/07/17 CT shows mild decrease in size of cyst -10/22/17 CT shows stable cyst   5. Atrial Fibrillation -The patient has a history of atrial fibrillation. -Her cardiologist wants her to undergo cardio inversion and anticoagulation. -I previously advised the patient that even if she undergoes cardio inversion, her Afib might return. Also, her anticoagulation medication might make her bleeding worse. -The patient should continue taking baby aspirin.  6. HTN -I previously advised the patient to continue medications and follow up with primary care physician  7. Nutrition -We previously discussed palliative chemotherapy will make her fatigued, nauseous, and loss of appetite. -I previously stressed the importance of a healthy diet with plenty of protein. -I previously referred to Nutrition.  -Her eating has improved.  8.Goal of care discussion  -We previously discussed the incurable nature of her  cancer, and the overall poor prognosis, especially if she does not have good response to chemotherapy or progress on chemo -The patient understands the goal of care is palliative. -I previously recommended DNR/DNI, she will think about it   9. Skin cracks on finger tips, Facial Acne type rash, secondary to chemo and panitumuamb  -Continue hydrocortisone and clindamycin gel for acneiform rash, which is very mild overall. -Eucerin, oatmeal bath, po benadryl PRN and Claritin for body itching -For her hands I previously suggested a vinegar and water soak along with daily neosporin around her nails. I previously recommend she use lotion after she washes her hands and advices her to wear cotton glove to protect her skin. -Her previous Vectibix rash is resolved.  -previously prescribed Urea cream for skin dryness and cracking on her hands. I advised her to wear gloves when doing things with her hands -Her facial acne has improved while off treatment.  -Acne is overall controlled. Continue with urea cream.   10.  Partial bowel obstruction, s/p sigmoid stent placement on 08/11/17 and placed on 12/04/17 -Hospitalized on 08/07/17, and had stent placed on 08/11/17.  -She now had recurrent bowel obstruction as of 11/28/17. She was given lactulose and put on a liquid diet on 11/28/17.  -Without much relief she was admitted to the hospital on 12/01/17 for her bowel obstruction and abdominal pain.  -She had a flexible sigmoidoscopy by Dr. Watt Climes on 12/04/17 and had stent placed.  -If endoscopy procedure is not able to resolve the obstruction, then surgery with diverting colostomy may be the only option. She is open to that.  -She will see Dr. Watt Climes in 4 months to monitor her stents, or sooner if needed   -she feels well after the stent placement, no pain or constipation    Plan:  -Labs reviewed and adequate to proceed with Vectibix and Xeloda today, pt wants to have chemo break after this cycle   -Cancel future  treatments scheduled -Lab, flush, and CT scan in am and f/u and panitumumab around noon/early pm in 6 weeks     All questions were answered. The patient knows to call the clinic with any problems, questions or concerns. I spent a total of 25 minutes for her visit, more than 50% face-to-face counseling.  Oneal Deputy, am acting as scribe for Truitt Merle, MD.   I have reviewed the above documentation for accuracy and completeness, and I agree with the above.      Truitt Merle, MD 01/09/2018

## 2018-01-09 ENCOUNTER — Inpatient Hospital Stay (HOSPITAL_BASED_OUTPATIENT_CLINIC_OR_DEPARTMENT_OTHER): Payer: Medicare Other | Admitting: Hematology

## 2018-01-09 ENCOUNTER — Inpatient Hospital Stay: Payer: Medicare Other

## 2018-01-09 ENCOUNTER — Telehealth: Payer: Self-pay

## 2018-01-09 ENCOUNTER — Encounter: Payer: Self-pay | Admitting: Hematology

## 2018-01-09 VITALS — BP 118/70 | HR 78 | Temp 98.3°F | Resp 18 | Ht 66.0 in | Wt 168.6 lb

## 2018-01-09 DIAGNOSIS — C186 Malignant neoplasm of descending colon: Secondary | ICD-10-CM

## 2018-01-09 DIAGNOSIS — C187 Malignant neoplasm of sigmoid colon: Secondary | ICD-10-CM

## 2018-01-09 DIAGNOSIS — I10 Essential (primary) hypertension: Secondary | ICD-10-CM | POA: Diagnosis not present

## 2018-01-09 DIAGNOSIS — I4891 Unspecified atrial fibrillation: Secondary | ICD-10-CM | POA: Diagnosis not present

## 2018-01-09 DIAGNOSIS — Z7189 Other specified counseling: Secondary | ICD-10-CM | POA: Diagnosis not present

## 2018-01-09 DIAGNOSIS — C787 Secondary malignant neoplasm of liver and intrahepatic bile duct: Secondary | ICD-10-CM

## 2018-01-09 DIAGNOSIS — Z5112 Encounter for antineoplastic immunotherapy: Secondary | ICD-10-CM | POA: Diagnosis not present

## 2018-01-09 DIAGNOSIS — C7800 Secondary malignant neoplasm of unspecified lung: Secondary | ICD-10-CM | POA: Diagnosis not present

## 2018-01-09 LAB — CMP (CANCER CENTER ONLY)
ALK PHOS: 88 U/L (ref 40–150)
ALT: 13 U/L (ref 0–55)
ANION GAP: 9 (ref 3–11)
AST: 18 U/L (ref 5–34)
Albumin: 3.4 g/dL — ABNORMAL LOW (ref 3.5–5.0)
BILIRUBIN TOTAL: 0.6 mg/dL (ref 0.2–1.2)
BUN: 16 mg/dL (ref 7–26)
CALCIUM: 9.1 mg/dL (ref 8.4–10.4)
CO2: 23 mmol/L (ref 22–29)
Chloride: 105 mmol/L (ref 98–109)
Creatinine: 0.87 mg/dL (ref 0.60–1.10)
GFR, Estimated: >60 mL/min (ref >60)
Glucose, Bld: 78 mg/dL (ref 70–140)
Potassium: 3.9 mmol/L (ref 3.5–5.1)
SODIUM: 137 mmol/L (ref 136–145)
TOTAL PROTEIN: 7.4 g/dL (ref 6.4–8.3)

## 2018-01-09 LAB — CBC WITH DIFFERENTIAL (CANCER CENTER ONLY)
BASOS ABS: 0 10*3/uL (ref 0.0–0.1)
Basophils Relative: 0 %
EOS PCT: 2 %
Eosinophils Absolute: 0.2 10*3/uL (ref 0.0–0.5)
HEMATOCRIT: 37.3 % (ref 34.8–46.6)
Hemoglobin: 11.9 g/dL (ref 11.6–15.9)
LYMPHS ABS: 1.8 10*3/uL (ref 0.9–3.3)
LYMPHS PCT: 19 %
MCH: 31.2 pg (ref 25.1–34.0)
MCHC: 31.9 g/dL (ref 31.5–36.0)
MCV: 97.6 fL (ref 79.5–101.0)
Monocytes Absolute: 1.2 10*3/uL — ABNORMAL HIGH (ref 0.1–0.9)
Monocytes Relative: 13 %
NEUTROS ABS: 6 10*3/uL (ref 1.5–6.5)
Neutrophils Relative %: 66 %
PLATELETS: 337 10*3/uL (ref 145–400)
RBC: 3.82 MIL/uL (ref 3.70–5.45)
RDW: 16.7 % — ABNORMAL HIGH (ref 11.2–14.5)
WBC Count: 9.2 10*3/uL (ref 3.9–10.3)

## 2018-01-09 MED ORDER — SODIUM CHLORIDE 0.9 % IV SOLN
6.0000 mg/kg | Freq: Once | INTRAVENOUS | Status: AC
  Administered 2018-01-09: 500 mg via INTRAVENOUS
  Filled 2018-01-09: qty 20

## 2018-01-09 MED ORDER — SODIUM CHLORIDE 0.9 % IV SOLN
Freq: Once | INTRAVENOUS | Status: AC
  Administered 2018-01-09: 10:00:00 via INTRAVENOUS

## 2018-01-09 NOTE — Telephone Encounter (Signed)
Printed avs and calender of upcoming appointment. Per 5/31 los 

## 2018-01-09 NOTE — Telephone Encounter (Signed)
Per R/S patient requested appointments to  be split due to long days are heavy on patient. Per 5/31 los

## 2018-01-09 NOTE — Patient Instructions (Signed)
[facility] Discharge Instructions for Patients Receiving Chemotherapy  Today you received the following chemotherapy agents: Vectibix.  To help prevent nausea and vomiting after your treatment, we encourage you to take your nausea medication as directed.   If you develop nausea and vomiting that is not controlled by your nausea medication, call the clinic.   BELOW ARE SYMPTOMS THAT SHOULD BE REPORTED IMMEDIATELY:  *FEVER GREATER THAN 100.5 F  *CHILLS WITH OR WITHOUT FEVER  NAUSEA AND VOMITING THAT IS NOT CONTROLLED WITH YOUR NAUSEA MEDICATION  *UNUSUAL SHORTNESS OF BREATH  *UNUSUAL BRUISING OR BLEEDING  TENDERNESS IN MOUTH AND THROAT WITH OR WITHOUT PRESENCE OF ULCERS  *URINARY PROBLEMS  *BOWEL PROBLEMS  UNUSUAL RASH Items with * indicate a potential emergency and should be followed up as soon as possible.  Feel free to call the clinic should you have any questions or concerns. The clinic phone number is [phone].  Please show the CHEMO ALERT CARD at check-in to the Emergency Department and triage nurse.   

## 2018-01-22 ENCOUNTER — Ambulatory Visit: Payer: Medicare Other | Admitting: Hematology

## 2018-01-22 ENCOUNTER — Ambulatory Visit: Payer: Medicare Other

## 2018-01-22 ENCOUNTER — Other Ambulatory Visit: Payer: Medicare Other

## 2018-02-18 ENCOUNTER — Encounter (HOSPITAL_COMMUNITY): Payer: Self-pay

## 2018-02-18 ENCOUNTER — Ambulatory Visit (HOSPITAL_COMMUNITY)
Admission: RE | Admit: 2018-02-18 | Discharge: 2018-02-18 | Disposition: A | Discharge status: Home / Self Care | Payer: Medicare Other | Source: Ambulatory Visit | Attending: Hematology | Admitting: Hematology

## 2018-02-18 ENCOUNTER — Inpatient Hospital Stay: Payer: Medicare Other | Attending: Hematology

## 2018-02-18 DIAGNOSIS — K5900 Constipation, unspecified: Secondary | ICD-10-CM | POA: Insufficient documentation

## 2018-02-18 DIAGNOSIS — I4891 Unspecified atrial fibrillation: Secondary | ICD-10-CM | POA: Diagnosis not present

## 2018-02-18 DIAGNOSIS — Z5112 Encounter for antineoplastic immunotherapy: Secondary | ICD-10-CM | POA: Insufficient documentation

## 2018-02-18 DIAGNOSIS — I10 Essential (primary) hypertension: Secondary | ICD-10-CM | POA: Insufficient documentation

## 2018-02-18 DIAGNOSIS — C229 Malignant neoplasm of liver, not specified as primary or secondary: Secondary | ICD-10-CM | POA: Diagnosis not present

## 2018-02-18 DIAGNOSIS — C7802 Secondary malignant neoplasm of left lung: Secondary | ICD-10-CM | POA: Insufficient documentation

## 2018-02-18 DIAGNOSIS — C7801 Secondary malignant neoplasm of right lung: Secondary | ICD-10-CM | POA: Insufficient documentation

## 2018-02-18 DIAGNOSIS — Z5111 Encounter for antineoplastic chemotherapy: Secondary | ICD-10-CM | POA: Insufficient documentation

## 2018-02-18 DIAGNOSIS — C787 Secondary malignant neoplasm of liver and intrahepatic bile duct: Secondary | ICD-10-CM | POA: Diagnosis not present

## 2018-02-18 DIAGNOSIS — I700 Atherosclerosis of aorta: Secondary | ICD-10-CM | POA: Diagnosis not present

## 2018-02-18 DIAGNOSIS — R110 Nausea: Secondary | ICD-10-CM | POA: Diagnosis not present

## 2018-02-18 DIAGNOSIS — R109 Unspecified abdominal pain: Secondary | ICD-10-CM | POA: Diagnosis not present

## 2018-02-18 DIAGNOSIS — C7800 Secondary malignant neoplasm of unspecified lung: Secondary | ICD-10-CM | POA: Diagnosis not present

## 2018-02-18 DIAGNOSIS — Z7189 Other specified counseling: Secondary | ICD-10-CM | POA: Insufficient documentation

## 2018-02-18 DIAGNOSIS — C187 Malignant neoplasm of sigmoid colon: Secondary | ICD-10-CM | POA: Insufficient documentation

## 2018-02-18 DIAGNOSIS — C186 Malignant neoplasm of descending colon: Secondary | ICD-10-CM

## 2018-02-18 DIAGNOSIS — Z7901 Long term (current) use of anticoagulants: Secondary | ICD-10-CM | POA: Diagnosis not present

## 2018-02-18 LAB — CMP (CANCER CENTER ONLY)
ALK PHOS: 101 U/L (ref 38–126)
ALT: 15 U/L (ref 0–44)
AST: 15 U/L (ref 15–41)
Albumin: 3.7 g/dL (ref 3.5–5.0)
Anion gap: 10 (ref 5–15)
BILIRUBIN TOTAL: 0.5 mg/dL (ref 0.3–1.2)
BUN: 15 mg/dL (ref 8–23)
CALCIUM: 9.7 mg/dL (ref 8.9–10.3)
CO2: 25 mmol/L (ref 22–32)
CREATININE: 1.08 mg/dL — AB (ref 0.44–1.00)
Chloride: 104 mmol/L (ref 98–111)
GFR, EST NON AFRICAN AMERICAN: 46 mL/min — AB (ref >60)
GFR, Est AFR Am: 54 mL/min — ABNORMAL LOW (ref >60)
Glucose, Bld: 91 mg/dL (ref 70–99)
Potassium: 4.5 mmol/L (ref 3.5–5.1)
SODIUM: 139 mmol/L (ref 135–145)
TOTAL PROTEIN: 8.7 g/dL — AB (ref 6.5–8.1)

## 2018-02-18 LAB — CBC WITH DIFFERENTIAL (CANCER CENTER ONLY)
Basophils Absolute: 0.1 10*3/uL (ref 0.0–0.1)
Basophils Relative: 1 %
Eosinophils Absolute: 0.2 10*3/uL (ref 0.0–0.5)
Eosinophils Relative: 2 %
HEMATOCRIT: 38 % (ref 34.8–46.6)
HEMOGLOBIN: 12.5 g/dL (ref 11.6–15.9)
LYMPHS ABS: 1.6 10*3/uL (ref 0.9–3.3)
LYMPHS PCT: 17 %
MCH: 30.3 pg (ref 25.1–34.0)
MCHC: 32.8 g/dL (ref 31.5–36.0)
MCV: 92.5 fL (ref 79.5–101.0)
MONOS PCT: 12 %
Monocytes Absolute: 1.1 10*3/uL — ABNORMAL HIGH (ref 0.1–0.9)
NEUTROS ABS: 6.5 10*3/uL (ref 1.5–6.5)
NEUTROS PCT: 68 %
Platelet Count: 393 10*3/uL (ref 145–400)
RBC: 4.11 MIL/uL (ref 3.70–5.45)
RDW: 16.2 % — ABNORMAL HIGH (ref 11.2–14.5)
WBC Count: 9.5 10*3/uL (ref 3.9–10.3)

## 2018-02-18 LAB — CEA (IN HOUSE-CHCC): CEA (CHCC-In House): 3.46 ng/mL (ref 0.00–5.00)

## 2018-02-18 LAB — MAGNESIUM: MAGNESIUM: 2.3 mg/dL (ref 1.7–2.4)

## 2018-02-18 MED ORDER — IOPAMIDOL (ISOVUE-300) INJECTION 61%
100.0000 mL | Freq: Once | INTRAVENOUS | Status: AC | PRN
  Administered 2018-02-18: 100 mL via INTRAVENOUS

## 2018-02-18 MED ORDER — IOPAMIDOL (ISOVUE-300) INJECTION 61%
INTRAVENOUS | Status: AC
  Filled 2018-02-18: qty 100

## 2018-02-19 NOTE — Progress Notes (Signed)
[place]  [phone] [phone]  Clinic Follow Up Note   Patient Care Team: Moshe Cipro, MD as PCP - General (Internal Medicine) Alexis Frock, MD as Consulting Physician (Urology) Michael Boston, MD as Consulting Physician (General Surgery) Toma Deiters, MD as Referring Physician (Gastroenterology)   Date of Service:  02/20/2018  CHIEF COMPLAINTS:  Follow up Metastatic sigmoid colon Adenocarcinoma  Oncology History   Cancer Staging Cancer of left colon [facility] Staging form: Colon and Rectum, AJCC 8th Edition - Clinical stage from 09/04/2016: Stage IVB (cTX, cNX, pM1b) - Signed by Truitt Merle, MD on 11/12/2016       Cancer of sigmoid colon ([place])   09/02/2016 Procedure    Colonoscopy Diverticulosis (scattered). Pt has about 10 cm structure starting at 20 cm with abnormal mucosa. Biopsies taken.       09/02/2016 Pathology Results    MODERATELY DIFFERENTIATED ADENOCARCINOMA OF COLON AT 20 CM      09/04/2016 Initial Diagnosis    Cancer of left colon ([place])      09/06/2016 Imaging    CT CAP w Contrast 1. No acute abdominopelvic process.  2. Heterogenous hypodense area involving the medial edge of the left kidney with multiple small soft tissue like stranding involving the left pararenal space. Findings are suspicious for malignancy of the left kidney with small pararenal metastases. MRI abdomen with contrast should be considered to further evaluate.  3. Vague hypodensity involving the lateral segment of the liver. This can also be reevaluated with MRI.  4. Partially visualized 5 mm posterior right lower lobe nodular density. CT chest without contrast follow up is recommended.  5. Complex multilobular left ovarian lesion may reflect a cluster of cysts.      10/10/2016 Procedure    Operative Report by Dr. Tresa Moore on 10/18/16 Procedure: 1) Cystoscopy, left retrograde pyelogram interpretation. 2) Left diagnostic uteroscopy. 3) Insertion of  left uteral stent; 5 x 24 Polaris with tether. Findings: 1) Unremarkable bladder. 2) Unremarkable left retrograde pyelogram. 3) No evidence of intraluminal urothelial neoplasm whatsoever with insepction of the left kidney and ureter times several. 4) Successful placement of left ureteral stent, proximal in renal pelvis and distal in urinary bladder.      10/15/2016 Imaging    MRI abdomen pelvis 1. There are 2 enhancing hepatic lesions highly worrisome for metastatic disease, likely metastatic colon cancer. 2. The lesion of concern in the anterior suprahilar lip of the left kidney is also enhancing, worrisome for neoplasm. This has a somewhat atypical appearance for renal cell carcinoma and could reflect a metastasis as well. 3. New left-sided hydroureter with delayed contrast excretion consistent with distal ureteral obstruction. Specific etiology uncertain. 4. Complex cystic and solid left adnexal lesion could reflect cystic neoplasm, although does not appear aggressive or appear to reflect the cause of the distal left ureteral obstruction. 5. Sigmoid colon wall thickening could be related to reported newly diagnosed colon cancer.      11/06/2016 PET scan    PET 11/06/2016 IMPRESSION: 1. Hypermetabolic pulmonary and hepatic lesions, most consistent with metastatic colon cancer, with the hypermetabolic primary located in the sigmoid colon. 2. Left renal and left adnexal lesions, better seen and evaluated on 10/15/2016. 3. Persistent moderate left hydronephrosis. 4. Aortic atherosclerosis (ICD10-170.0).      11/11/2016 Pathology Results    Liver, needle/core biopsy, left lobe - METASTATIC ADENOCARCINOMA. The histologic features are consistent with a primary gastrointestinal adenocarcinoma.      05/19/2017 Imaging  CT Abdomen Pelvis, 05/19/2017 IMPRESSION: 1. Worsening pulmonary and hepatic metastatic disease. Primary sigmoid lesion is stable to minimally enlarged. 2. Stable  to slight enlargement of a left perinephric mass, likely a metastasis. Renal cell carcinoma is not excluded. Associated pelvocaliectasis versus mild hydronephrosis. 3. Borderline left periaortic lymph nodes, slightly enlarged. 4. Cholelithiasis. 5.  Aortic atherosclerosis (ICD10-170.0). 6. Cystic and solid lesion in the left adnexa, as on prior exams, likely ovarian in origin.      06/06/2017 -  Chemotherapy    IV antibody Vectibix every [redacted] weeks along with oral chemo Xeloda for 1 week on and 1 week off starting 06/06/17. Due to skin toxicities we reduced Xeloda to 2000 mg in the AM and 1536m in the PM 1 week on and 1 week off on 10/31/17. She had multiple treatment break due to hospitalization. Restarted treatment on 12/26/17. Chemo break 01/16/18-02/20/18      08/07/2017 - 08/16/2017 Hospital Admission    Admit date: 08/07/17 Admission diagnosis: Bowel Obstruction  Additional comments: She was admitted to the hospital on 08/07/17 after [facility] due to a bowel obstruction.  She had a sigmoid colon stent placement on 08/11/17. Bowel obstruction resolved.  CT scan in hospital showed great partial response to chemotherapy. She will continue chemotherapy given good overall tolerance.        08/07/2017 Imaging    CT AP W Contrast 08/07/17 IMPRESSION: Low colonic obstruction secondary to a rectosigmoid stricture at the site of the patient's prior colonic mass, which is no longer evident on CT. Mild upstream colonic wall thickening raises the possibility of superimposed colitis.  Improving hepatic metastases, as above. Left perirenal metastasis, decreased. Visualized pulmonary metastases at the lung bases are grossly unchanged.  3.5 cm mixed cystic/solid left ovarian mass is mildly decreased.       08/11/2017 Procedure    By Dr. MWatt Climes Flexible Sigmoidoscopy 08/11/17 IMPRESSION:   - Preparation of the colon was fair. - Likely malignant partially  obstructing tumor in the recto-sigmoid colon versus necrotic scarring. Injected with contrast proximal to obstruction to confirm proper position. Prosthesis placed. - Stool in the rectum. - No specimens collected.       10/22/2017 Imaging    CT CAP W Contrast 10/22/17 IMPRESSION: 1. Significant interval improvement and liver metastasis. 2. The left perinephric mass is slightly decreased in size in the interval. 3. No significant interval change in the appearance of multiple pulmonary metastasis. 4. Stable appearance of mediastinal adenopathy. 5. Gallstones 6.  Aortic Atherosclerosis (ICD10-I70.0). 7. Colonic stent remains in place without evidence for bowel obstruction.      12/01/2017 - 12/07/2017 Hospital Admission    Admit date: 12/01/17 Admission diagnosis: Bowel Obstruction and abdominal pain Additional comments: had flexible sigmoidoscopy by Dr. MWatt Climeson 12/04/17. Chemo was held during this time.       12/04/2017 Procedure    Flexible Sigmoidoscopy 12/04/17 IMPRESSION - Preparation of the colon was fair. - Stent in the colon. - Stricture in the distal sigmoid colon. Prosthesis placed. - No specimens collected.      02/18/2018 Imaging    CT CAP W Contrast 02/18/18 IMPRESSION: CT CHEST IMPRESSION 1. Bilateral pulmonary metastasis. Compared to the 12/01/2017 abdominal CT, mixed response to therapy. Compared to the 10/22/2017 chest CT, overall slight progression. 2. Similar thoracic adenopathy. 3.  Aortic Atherosclerosis (ICD10-I70.0). CT ABDOMEN AND PELVIS IMPRESSION 1. Since 12/01/2017, similar hepatic metastatic burden. Left renal/perirenal lesion is similar to minimally decreased in size. 2.  Sigmoid colonic stent in place with resolution of colonic obstruction. 3. Similar mild soft tissue fullness within both ovaries.      02/19/2018 -  Chemotherapy    The patient had palonosetron (ALOXI) injection 0.25 mg, 0.25 mg, Intravenous,  Once, 1 of 4 cycles Administration:  0.25 mg (02/20/2018) irinotecan (CAMPTOSAR) 260 mg in dextrose 5 % 500 mL chemo infusion, 140 mg/m2 = 260 mg (100 % of original dose 140 mg/m2), Intravenous,  Once, 1 of 4 cycles Dose modification: 120 mg/m2 (original dose 140 mg/m2, Cycle 1, Reason: Provider Judgment), 140 mg/m2 (original dose 140 mg/m2, Cycle 1, Reason: Provider Judgment, Comment: old age ) Administration: 260 mg (02/20/2018) panitumumab (VECTIBIX) 460 mg in sodium chloride 0.9 % 100 mL chemo infusion, 6 mg/kg = 460 mg, Intravenous,  Once, 0 of 3 cycles  for chemotherapy treatment.       HISTORY OF PRESENTING ILLNESS (10/25/2016):  Mary [name] 84 y.o. female is here because of adenocarcinoma of the colon and a left renal mass. The pt noticed worsening episodes of abdominal cramping and nausea with bowel movements in July 2017. She had an abdominal US performed, which was unremarkable. A colonoscopy was then performed at the patient's request, which found a mass about 20 cm from the anal verge. Biopsy showed moderately differentiated adenocarcinoma. MRI done, with a lesion noticed in the left kidney near the pelvis, a 11 mm hepatic lesion, 4 mm lung nodule, and possible cystic left ovarian mass. Pt saw Dr. Johney Maine for information on abdominal surgery for her colon, which she declined. She also saw Dr. Lorna Dibble, who is considering a nephrectomy, but is planning a sternoscopy to better characterize the kidney lesion. She presents today for continued treatment.    She presents today with her son and daughter-in-law. The symptoms started in late July 2017. She had some vomiting, abdominal pain, and constipation, but not bleeding. Her PCP never found any anemia. Initially she lost her appetite, but now she is taking multiple vitamins and supplements, so her appetite is back. Her bowel has improved, but she still has abdominal pain that comes and goes, lasting minutes. The pain is tolerable with aleve. She did not tolerate Tramadol well; nausea  and vomiting. She has some occasional left sides back pain. Denies weight loss, or any other concerns. She is able to do everything she needs to around the house.   She had a partial hysterectomy, and a lumpectomy. She has a history of HTN, atrial fibrillation, high cholesterol. She has some hearing loss and can not see out of her right eye. Not a diabetic, but her son and her mother are. No family history of colon cancer. Her sister had breast cancer in her 91's and her brother had lung cancer in his 84's. Never smoker. Doesn't drink alcohol. 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 She has two sons; one lives an hour away, and the other lives in [place]. She lives alone.   CURRENT THERAPY:  IV antibody Vectibix every [redacted] weeks along with oral chemo Xeloda for 1 week on and 1 week off starting 06/06/17. Due to skin toxicities we reduced Xeloda to 2000 mg in the AM and 1558m in the PM 1 week on and 1 week off on 10/31/17. She had multiple treatment break due to hospitalization. Restarted treatment on 12/26/17. Chemo break 01/16/18-02/20/18, Will change to second line Irinotecan and Victibix every 2 weeks on 02/20/2018  INTERVAL HISTORY:   AHEATHERLY STENNERis here for a follow up  after chemo break and discuss her recent scan. She presents to the clinic today accompanied by her daughter-in-law.She notes she has been doing well but with more abdominal pain and nausea. She plans to see Dr. Watt Climes soon. She denies constipation but notes hard stool that is slow too pass. She has been taking Senakot-S 2-3 tabs daily and Miralax. She did not tolerate Magnesium citrate. She still takes Miralax 30 minutes after each meal daily. She is fine to proceed with treatment.   Her son is not recovering well from thyroid surgery.     MEDICAL HISTORY:  Past Medical History:  Diagnosis Date  . Aneurysm of ascending aorta ([facility]) 12/03/2016   3.7 cm by echo 10/2016  . Colon cancer [facility])    dx via biospy from colonoscopy-- malignant  ---  currently having work-up done w/ dr gross (general surgeon) and coordinate surgery w/ urologsit for renal tumor  . Essential hypertension 10/31/2016  . GERD (gastroesophageal reflux disease)   . Glaucoma   . Hiatal hernia   . HOH (hard of hearing)    bilateral even w/ hearing aids  . Hyperlipidemia   . Kidney tumor    left side  . Legally blind in right eye, as defined in [place]    due to macular degeneration  . Macular degeneration   . Nausea    intermittant due to colon mass  . Ovarian cyst   . Persistent atrial fibrillation ([place]) 10/31/2016   on ASA only  . PONV (postoperative nausea and vomiting)   . Wears glasses   . Wears hearing aid    bilateral    SURGICAL HISTORY: Past Surgical History:  Procedure Laterality Date  . BREAST CYST EXCISION  1990   benign  . COLONIC STENT PLACEMENT N/A 12/04/2017   Procedure: COLONIC STENT PLACEMENT;  Surgeon: Clarene Essex, MD;  Location: [facility];  Service: Endoscopy;  Laterality: N/A;  . COLONOSCOPY  09/04/2016  . CYSTOSCOPY WITH RETROGRADE PYELOGRAM, URETEROSCOPY AND STENT PLACEMENT Left 10/10/2016   Procedure: CYSTOSCOPY WITH RETROGRADE PYELOGRAM, DIAGNOSTIC URETEROSCOPY  AND STENT PLACEMENT;  Surgeon: Alexis Frock, MD;  Location: [facility];  Service: Urology;  Laterality: Left;  . ESOPHAGOGASTRODUODENOSCOPY  11/07/2014  . FLEXIBLE SIGMOIDOSCOPY N/A 08/11/2017   Procedure: FLEXIBLE SIGMOIDOSCOPY;  Surgeon: Clarene Essex, MD;  Location: [facility];  Service: Endoscopy;  Laterality: N/A;  With Fluoroscopy for colonic stent placement for colonic stricture  . FLEXIBLE SIGMOIDOSCOPY N/A 12/04/2017   Procedure: FLEXIBLE SIGMOIDOSCOPY;  Surgeon: Clarene Essex, MD;  Location: [facility];  Service: Endoscopy;  Laterality: N/A;  . NASAL SINUS SURGERY    . TONSILLECTOMY      SOCIAL HISTORY: Social History   Socioeconomic History  . Marital status: Widowed    Spouse name: Not on file  . Number of children: Not on file    . Years of education: Not on file  . Highest education level: Not on file  Occupational History  . Not on file  Social Needs  . Financial resource strain: Not on file  . Food insecurity:    Worry: Not on file    Inability: Not on file  . Transportation needs:    Medical: Not on file    Non-medical: Not on file  Tobacco Use  . Smoking status: Never Smoker  . Smokeless tobacco: Never Used  Substance and Sexual Activity  . Alcohol use: No  . Drug use: No  . Sexual activity: Not on file  Lifestyle  . Physical  activity:    Days per week: Not on file    Minutes per session: Not on file  . Stress: Not on file  Relationships  . Social connections:    Talks on phone: Not on file    Gets together: Not on file    Attends religious service: Not on file    Active member of club or organization: Not on file    Attends meetings of clubs or organizations: Not on file    Relationship status: Not on file  . Intimate partner violence:    Fear of current or ex partner: Not on file    Emotionally abused: Not on file    Physically abused: Not on file    Forced sexual activity: Not on file  Other Topics Concern  . Not on file  Social History Narrative  . Not on file    FAMILY HISTORY: Family History  Problem Relation Age of Onset  . Cancer Sister 6       breast cancer   . Cancer Brother 43       lung cancer   . Diabetes Son   . High blood pressure Son   . Peripheral Artery Disease Mother   . High blood pressure Mother     ALLERGIES:  is allergic to benazepril; tylenol [acetaminophen]; and bactrim [sulfamethoxazole-trimethoprim].  MEDICATIONS:  Current Outpatient Medications  Medication Sig Dispense Refill  . amLODipine (NORVASC) 5 MG tablet Take 1 tablet (5 mg total) by mouth daily. 90 tablet 1  . Bisacodyl (DULCOLAX PO) Take 2 tablets by mouth 2 (two) times daily.    . bisacodyl (DULCOLAX) 10 MG suppository Place 10 mg rectally as needed for moderate constipation.    .  capecitabine (XELODA) 500 MG tablet Take 4 tablets ( 2000 mg ) in  AM ;  3 tablets ( 1500 mg ) in  PM for  7 days on ,  Off 7 days. 98 tablet 2  . diltiazem (CARDIZEM CD) 240 MG 24 hr capsule Take 1 capsule (240 mg total) by mouth daily. 90 capsule 1  . escitalopram (LEXAPRO) 10 MG tablet Take 10 mg by mouth daily.    . fexofenadine (ALLEGRA) 180 MG tablet Take 180 mg by mouth daily.    Marland Kitchen latanoprost (XALATAN) 0.005 % ophthalmic solution Place 1 drop into both eyes at bedtime.    Marland Kitchen losartan (COZAAR) 100 MG tablet Take 1 tablet (100 mg total) by mouth daily. 90 tablet 0  . Multiple Vitamins-Minerals (VITEYES AREDS ADVANCED) CAPS Take 1 capsule by mouth daily.    Marland Kitchen omeprazole (PRILOSEC) 40 MG capsule Take 40 mg by mouth daily.    . ondansetron (ZOFRAN ODT) 4 MG disintegrating tablet Take 1 tablet (4 mg total) by mouth every 8 (eight) hours as needed for nausea or vomiting. 30 tablet 0  . oxyCODONE (OXY IR/ROXICODONE) 5 MG immediate release tablet Take 1 tablet (5 mg total) by mouth every 6 (six) hours as needed for moderate pain or severe pain. 90 tablet 0  . Polyethyl Glycol-Propyl Glycol (SYSTANE) 0.4-0.3 % SOLN Apply 1 drop to eye daily as needed (dry eyes).     . polyethylene glycol (MIRALAX / GLYCOLAX) packet Take 17 g by mouth daily as needed for moderate constipation. 30 each 0  . Potassium Chloride ER 20 MEQ TBCR Take 20 mEq by mouth daily. 30 tablet 0  . sennosides-docusate sodium (SENOKOT-S) 8.6-50 MG tablet Take 1-2 tablets by mouth 3 (three) times daily.    Marland Kitchen  urea (CARMOL) 10 % cream Apply topically as needed. 71 g 0  . prochlorperazine (COMPAZINE) 10 MG tablet Take 1 tablet (10 mg total) by mouth every 6 (six) hours as needed for nausea or vomiting. 30 tablet 0   Current Facility-Administered Medications  Medication Dose Route Frequency Provider Last Rate Last Dose  . sodium chloride flush (NS) 0.9 % injection 3 mL  3 mL Intravenous PRN Skeet Latch, MD        REVIEW OF SYSTEMS:    Constitutional: Denies fevers, chills or abnormal   Eyes: Denies blurriness of vision, double vision or watery eyes Ears, nose, mouth, throat, and face: Denies mucositis or sore throat Respiratory: Denies cough, dyspnea or wheezes Cardiovascular: Denies palpitation, chest discomfort  Gastrointestinal:  Denies heartburn  (+) right abdominal tenderness and pain (+) hard stool  Skin: Denies rashes  Lymphatics: Denies new lymphadenopathy or easy bruising MSK: negative Neurological:Denies numbness, tingling or new weaknesses Behavioral/Psych: Mood is stable, no new changes  All other systems were reviewed with the patient and are negative.  PHYSICAL EXAMINATION:  ECOG PERFORMANCE STATUS: 1 - Symptomatic but completely ambulatory   Vitals:   02/20/18 0953  BP: 137/78  Pulse: 79  Resp: 18  Temp: 98.1 F (36.7 C)  TempSrc: Oral  SpO2: 99%  Weight: 172 lb 6.4 oz (78.2 kg)  Height: _0  (1.676 m)     GENERAL:alert, no distress and comfortable SKIN: skin color, texture, turgor are normal  EYES: normal, conjunctiva are pink and non-injected, sclera clear OROPHARYNX:no exudate, no erythema and lips, buccal mucosa, and tongue normal  NECK: supple, thyroid normal size, non-tender, without nodularity LYMPH:  no palpable lymphadenopathy in the cervical, axillary or inguinal LUNGS: clear to auscultation and percussion with normal breathing effort HEART: regular rate & rhythm and no murmurs and   ABDOMEN:abdomen soft, mild tenderness at LLQ and right side of abdomen,  (+) active bowel sounds Musculoskeletal:no cyanosis of digits and no clubbing  PSYCH: alert & oriented x 3 with fluent speech NEURO: no focal motor/sensory deficits   LABORATORY DATA:  I have reviewed the data as listed CBC Latest Ref Rng & Units 02/18/2018 01/09/2018 12/26/2017  WBC 3.9 - 10.3 K/uL 9.5 9.2 7.2  Hemoglobin 11.6 - 15.9 g/dL 12.5 11.9 11.7  Hematocrit 34.8 - 46.6 % 38.0 37.3 36.6  Platelets 145 - 400 K/uL  393 337 387   CMP Latest Ref Rng & Units 02/18/2018 01/09/2018 12/26/2017  Glucose 70 - 99 mg/dL 91 78 101  BUN 8 - 23 mg/dL _1 Creatinine 0.44 - 1.00 mg/dL 1.08(H) 0.87 1.22(H)  Sodium 135 - 145 mmol/L 139 137 138  Potassium 3.5 - 5.1 mmol/L 4.5 3.9 4.5  Chloride 98 - 111 mmol/L 104 105 107  CO2 22 - 32 mmol/L _2 Calcium 8.9 - 10.3 mg/dL 9.7 9.1 9.5  Total Protein 6.5 - 8.1 g/dL 8.7(H) 7.4 7.8  Total Bilirubin 0.3 - 1.2 mg/dL 0.5 0.6 0.4  Alkaline Phos 38 - 126 U/L 101 88 92  AST 15 - 41 U/L _3 ALT 0 - 44 U/L _4 PATHOLOGY: [facility] 11/11/16   Diagnosis 11/11/16 Liver, needle/core biopsy, left lobe - METASTATIC ADENOCARCINOMA. - SEE COMMENT. Microscopic Comment The histologic features are consistent with a primary gastrointestinal adenocarcinoma. Dr. Vicente Males has reviewed the case and concurs with this interpretation. There is likely sufficient tumor present for additional studies, if requested. Enid Cutter MD  Pathologist, Electronic Signature (Case signed 11/12/2016)  Diagnosis 09/04/2016 MODERATELY DIFFERENTIATED ADENOCARCINOMA OF COLON AT 20 CM Tumor Site: Colon at 20 cm Tumor Type: Adenocarcinoma Tumore Size: Unknown  Grade: Moderately differentiated   RADIOGRAPHIC STUDIES: I have personally reviewed the radiological images as listed and agreed with the findings in the report.   CT CAP W Contrast 02/18/18 IMPRESSION: CT CHEST IMPRESSION 1. Bilateral pulmonary metastasis. Compared to the 12/01/2017 abdominal CT, mixed response to therapy. Compared to the 10/22/2017 chest CT, overall slight progression. 2. Similar thoracic adenopathy. 3.  Aortic Atherosclerosis (ICD10-I70.0). CT ABDOMEN AND PELVIS IMPRESSION 1. Since 12/01/2017, similar hepatic metastatic burden. Left renal/perirenal lesion is similar to minimally decreased in size. 2. Sigmoid colonic stent in place with resolution of colonic obstruction. 3. Similar mild soft  tissue fullness within both ovaries.   DG Abdomen 11/28/17 IMPRESSION: Stent within the sigmoid colon. Gaseous distention of bowel above the stent concerning for a degree of bowel obstruction.  CT CAP W Contrast 10/22/17 IMPRESSION: 1. Significant interval improvement and liver metastasis. 2. The left perinephric mass is slightly decreased in size in the interval. 3. No significant interval change in the appearance of multiple pulmonary metastasis. 4. Stable appearance of mediastinal adenopathy. 5. Gallstones 6.  Aortic Atherosclerosis (ICD10-I70.0). 7. Colonic stent remains in place without evidence for bowel obstruction.  CT AP W Contrast 08/07/17 IMPRESSION: Low colonic obstruction secondary to a rectosigmoid stricture at the site of the patient's prior colonic mass, which is no longer evident on CT. Mild upstream colonic wall thickening raises the possibility of superimposed colitis.Improving hepatic metastases, as above. Left perirenal metastasis, decreased. Visualized pulmonary metastases at the lung bases are grossly unchanged. 3.5 cm mixed cystic/solid left ovarian mass is mildly decreased.  CT Chest 06/06/17  IMPRESSION: 1. There are multiple new and enlarging pulmonary nodules throughout the lungs bilaterally. Additionally, a few of the nodules have decreased in size when compared to prior. 2. Interval increase in size of precarinal lymph node. 3. Interval increase in size of hepatic lesions. 4. Aortic Atherosclerosis (ICD10-I70.0).  CT Abdomen Pelvis, 05/19/2017 IMPRESSION: 1. Worsening pulmonary and hepatic metastatic disease. Primary sigmoid lesion is stable to minimally enlarged. 2. Stable to slight enlargement of a left perinephric mass, likely a metastasis. Renal cell carcinoma is not excluded. Associated pelvocaliectasis versus mild hydronephrosis. 3. Borderline left periaortic lymph nodes, slightly enlarged. 4. Cholelithiasis. 5.  Aortic atherosclerosis  (ICD10-170.0). 6. Cystic and solid lesion in the left adnexa, as on prior exams, likely ovarian in origin.  PET 11/06/2016 IMPRESSION: 1. Hypermetabolic pulmonary and hepatic lesions, most consistent with metastatic colon cancer, with the hypermetabolic primary located in the sigmoid colon. 2. Left renal and left adnexal lesions, better seen and evaluated on 10/15/2016. 3. Persistent moderate left hydronephrosis. 4. Aortic atherosclerosis (ICD10-170.0).  MRI Abdomen Pelvis w/wo Contrast 10/15/2016 IMPRESSION: 1. There are 2 enhancing hepatic lesions highly worrisome for metastatic disease, likely metastatic colon cancer. 2. The lesion of concern in the anterior suprahilar lip of the left kidney is also enhancing, worrisome for neoplasm. This has a somewhat atypical appearance for renal cell carcinoma and could reflect a metastasis as well. 3. New left-sided hydroureter with delayed contrast excretion consistent with distal ureteral obstruction. Specific etiology uncertain. 4. Complex cystic and solid left adnexal lesion could reflect cystic neoplasm, although does not appear aggressive or appear to reflect the cause of the distal left ureteral obstruction. 5. Sigmoid colon wall thickening could be related to reported newly diagnosed colon cancer.  CT CAP  w Contrast 09/06/2016 (done outside) IMPRESSION: 1. No acute abdominopelvic process.  2. Heterogenous hypodense area involving the medial edge of the left kidney with multiple small soft tissue like stranding involving the left pararenal space. Findings are suspicious for malignancy of the left kidney with small pararenal metastases. MRI abdomen with contrast should be considered to further evaluate.  3. Vague hypodensity involving the lateral segment of the liver. This can also be reevaluated with MRI.  4. Partially visualized 5 mm posterior right lower lobe nodular density. CT chest without contrast follow up is recommended.  5.  Complex multilobular left ovarian lesion may reflect a cluster of cysts.   PROCEDURES  Flexible Sigmoidoscopy 12/04/17 IMPRESSION - Preparation of the colon was fair. - Stent in the colon. - Stricture in the distal sigmoid colon. Prosthesis placed. - No specimens collected.   Flexible Sigmoidoscopy 08/11/17 IMPRESSION:   - Preparation of the colon was fair. - Likely malignant partially obstructing tumor in the recto-sigmoid colon versus necrotic scarring. Injected with contrast proximal to obstruction to confirm proper position. Prosthesis placed. - Stool in the rectum. - No specimens collected.   Echocardiogram 11/03/16 - Left ventricle: The cavity size was normal. Wall thickness was   increased in a pattern of mild LVH. Indeterminate diastolic   function (atrial fibrillation). Systolic function was normal. The   estimated ejection fraction was in the range of 60% to 65%.   Operative Report by Dr. Tresa Moore on 10/10/16 Procedure: 1) Cystoscopy, left retrograde pyelogram interpretation. 2) Left diagnostic uteroscopy. 3) Insertion of left uteral stent; 5 x 24 Polaris with tether. Findings: 1) Unremarkable bladder. 2) Unremarkable left retrograde pyelogram. 3) No evidence of intraluminal urothelial neoplasm whatsoever with inspection of the left kidney and ureter times several. 4) Successful placement of left ureteral stent, proximal in renal pelvis and distal in urinary bladder.  Colonoscopy 09/02/2016 FINDINGS: Diverticulosis (scattered). Pt has about 10 cm structure starting at 20 cm with abnormal mucosa. The colonoscopy scope was not able to advance due to structure, and upper endoscopy scope was used and it was advanced through the stricture to see ileocecal valve. Biopsies at the stricture were taken.   ASSESSMENT & PLAN:  Mary [name] is a 84 y.o. female   1. Adenocarcinoma of left colon, sigmoid colon, TxNxM1 with liver and lung mets, stage IV, KRAS/NRS wild type, MSI-stable  -We  previously discussed her incurable nature of metastatic colon cancer, and the atypical of therapy. -She has been on first-line chemotherapy with chemotherapy and Panitumumab, tolerating very well overall, and has had partial response. -However she developed recurrent bowel obstruction secondary to the sigmoid colon mass, status post colonic stent placement twice by Dr. Watt Climes. -I previously reviewed her CT scan from 12/01/17 which showed her bowel obstruction as well as good response to treatment in her liver and lung mets.  -I previously discussed she may have recurrent bowel obstruction in the future, and he can follow-up with Dr. Watt Climes closely. Will watch her symptoms, especially constipation and abdominal pain closely. We also discussed the surgical option of diverting colostomy, she previously saw Dr. Johney Maine, she would like to hold on surgery as long as she can.  -I recommended she continue with her systemic therapy given she is responding well. Pt agreed to continue treatment.  She restarted on 12/26/17. She took a chemo break after 01/16/18 then changed treatment.  -We discussed her CT CAP from 02/18/18 which showed stent adequately placed and no bowel obstruction. B/l lung mets have slight progression  and her liver mets has slight decrease, overall stable disease.  -She now has mild disease progression in the lungs, and has had a recurrent biopsy option it is a primary sigmoid colon site, she has been on low-dose chemo overall, which I feel is not adequately controlled disease.  I discussed the option of changing treatment to intravenous chemo, such as in undertaken, continue current Xeloda and vectibix, versus palliative care and hospice.  She overall is elderly, does not want have side effects from chemo, is reluctant to take intensive chemotherapy such as FOLFOX or FOLFIRI.  A lengthy discussion, she agrees to change Xarelto to irinotecan, every 2 weeks, I will continue vectibix given her overall stable  disease.  Not a candidate for Avastin due to the recurrent bowel obstruction  --Chemotherapy consent: Side effects including but does not not limited to, fatigue, nausea, vomiting, diarrhea, hair loss, neuropathy, fluid retention, renal and kidney dysfunction, neutropenic fever, needed for blood transfusion, bleeding, were discussed with patient in great detail. She agrees to proceed. -We discussed port placement.  He wants to try peripheral vein first. If she is tolerating Irinotecan well, she is willing to get PAC placed.  -Labs reviewed and adequate to proceed with treatment.  -F/u in 2 weeks   2. Constipation, abdominal pain -I previously reviewed laxative use. She will take a daily dose of Miralax up to 3-4 times a day and if she does not have a BM for 3-4 days she will also use Senokot-S. -Pt notes Magnesium citrate caused significant bloating and she does not want to take it again.  -She is currently on oxycodone 3-4 times a day  -Due to recurrent bowel obstructions, on 12/04/17 Dr. Watt Climes did a stent placement.  -Her pain has returned and her stool has hardened recently. I recommend she increase her Senakot-S to 2-3 tabs BID and continue Miralax 30 minutes postprandial.    3. Left renal mass and left hydronephrosis -Possible renal cell carcinoma, metastatic lesion from colon cancer is also a possibility. -She has been seen by [facility], Dr. Tresa Moore, and had left ureter stent placement on 10/10/2016.  -no signs of hydronephrosis on 02/18/18 CT scan, and a left renal mass is likely decreased but overall similar.   4. Complex cyst of left ovary -will monitor. Likely benign -If she undergoes abdominal surgery, may remove it -She declines surgery for now -08/07/17 CT shows mild decrease in size of cyst -02/18/18 CT shows overall stable cyst   5. Atrial Fibrillation -The patient has a history of atrial fibrillation. -Her cardiologist wants her to undergo cardio inversion and anticoagulation. -I  previously advised the patient that even if she undergoes cardio inversion, her Afib might return. Also, her anticoagulation medication might make her bleeding worse. -The patient should continue taking baby aspirin.  6. HTN -I previously advised the patient to continue medications and follow up with primary care physician -BP normal in clinic lately  7. Nutrition -We previously discussed palliative chemotherapy will make her fatigued, nauseous, and loss of appetite. -I previously stressed the importance of a healthy diet with plenty of protein. -I previously referred to Nutrition.  -She has been gaining weight lately  8.Goal of care discussion  -We previously discussed the incurable nature of her cancer, and the overall poor prognosis, especially if she does not have good response to chemotherapy or progress on chemo -The patient understands the goal of care is palliative. -I previously recommended DNR/DNI, she will think about it   9. Partial  bowel obstruction, s/p sigmoid stent placement on 08/11/17 and again on 12/04/17 -Hospitalized on 08/07/17, and had stent placed on 08/11/17.  -She now had recurrent bowel obstruction as of 11/28/17. She was given lactulose and put on a liquid diet on 11/28/17.  -Without much relief she was admitted to the hospital on 12/01/17 for her bowel obstruction and abdominal pain.  -She had a flexible sigmoidoscopy by Dr. Watt Climes on 12/04/17 and had stent placed.  -If endoscopy procedure is not able to resolve the obstruction, then surgery with diverting colostomy may be the only option. She is open to that.  -She will see Dr. Watt Climes in 4 months to monitor her stents, or sooner if needed   -Her pain and hardened stool has returned, she will increase laxatives and if not improved or resolved, she will follow up with Dr. Watt Climes.    Plan:  -Labs reviewed and adequate to proceed with treatment  -will change Xeloda to irinotecan, and continue vectibix, starting today.   Reduce irinotecan to 140 mg/m for first cycle, if she tolerates well, will increase to 131m/m2 for subsequent cycles  -Lab, f/u and chemo irinotecan in 2, 4 and 6 weeks    All questions were answered. The patient knows to call the clinic with any problems, questions or concerns. I spent a total of 30 minutes for her visit, more than 50% face-to-face counseling.  IOneal Deputy am acting as scribe for YTruitt Merle MD.   I have reviewed the above documentation for accuracy and completeness, and I agree with the above.       YTruitt Merle MD 02/20/2018

## 2018-02-20 ENCOUNTER — Telehealth: Payer: Self-pay | Admitting: Hematology

## 2018-02-20 ENCOUNTER — Other Ambulatory Visit: Payer: Medicare Other

## 2018-02-20 ENCOUNTER — Ambulatory Visit: Payer: Medicare Other | Admitting: Hematology

## 2018-02-20 ENCOUNTER — Inpatient Hospital Stay (HOSPITAL_BASED_OUTPATIENT_CLINIC_OR_DEPARTMENT_OTHER): Payer: Medicare Other | Admitting: Hematology

## 2018-02-20 ENCOUNTER — Inpatient Hospital Stay: Payer: Medicare Other

## 2018-02-20 ENCOUNTER — Encounter: Payer: Self-pay | Admitting: Hematology

## 2018-02-20 ENCOUNTER — Ambulatory Visit: Payer: Medicare Other

## 2018-02-20 ENCOUNTER — Ambulatory Visit (HOSPITAL_COMMUNITY): Payer: Medicare Other

## 2018-02-20 VITALS — BP 137/78 | HR 79 | Temp 98.1°F | Resp 18 | Ht 66.0 in | Wt 172.4 lb

## 2018-02-20 DIAGNOSIS — Z7901 Long term (current) use of anticoagulants: Secondary | ICD-10-CM

## 2018-02-20 DIAGNOSIS — I10 Essential (primary) hypertension: Secondary | ICD-10-CM

## 2018-02-20 DIAGNOSIS — R109 Unspecified abdominal pain: Secondary | ICD-10-CM

## 2018-02-20 DIAGNOSIS — Z5111 Encounter for antineoplastic chemotherapy: Secondary | ICD-10-CM | POA: Diagnosis not present

## 2018-02-20 DIAGNOSIS — I4891 Unspecified atrial fibrillation: Secondary | ICD-10-CM | POA: Diagnosis not present

## 2018-02-20 DIAGNOSIS — R110 Nausea: Secondary | ICD-10-CM

## 2018-02-20 DIAGNOSIS — C7801 Secondary malignant neoplasm of right lung: Secondary | ICD-10-CM | POA: Diagnosis not present

## 2018-02-20 DIAGNOSIS — C187 Malignant neoplasm of sigmoid colon: Secondary | ICD-10-CM

## 2018-02-20 DIAGNOSIS — C7802 Secondary malignant neoplasm of left lung: Secondary | ICD-10-CM

## 2018-02-20 DIAGNOSIS — K5900 Constipation, unspecified: Secondary | ICD-10-CM | POA: Diagnosis not present

## 2018-02-20 DIAGNOSIS — Z5112 Encounter for antineoplastic immunotherapy: Secondary | ICD-10-CM | POA: Diagnosis not present

## 2018-02-20 DIAGNOSIS — Z7189 Other specified counseling: Secondary | ICD-10-CM

## 2018-02-20 DIAGNOSIS — C186 Malignant neoplasm of descending colon: Secondary | ICD-10-CM

## 2018-02-20 DIAGNOSIS — C787 Secondary malignant neoplasm of liver and intrahepatic bile duct: Secondary | ICD-10-CM

## 2018-02-20 MED ORDER — DEXAMETHASONE SODIUM PHOSPHATE 10 MG/ML IJ SOLN
10.0000 mg | Freq: Once | INTRAMUSCULAR | Status: AC
Start: 2018-02-20 — End: 2018-02-20
  Administered 2018-02-20: 10 mg via INTRAVENOUS

## 2018-02-20 MED ORDER — PALONOSETRON HCL INJECTION 0.25 MG/5ML
0.2500 mg | Freq: Once | INTRAVENOUS | Status: AC
  Administered 2018-02-20: 0.25 mg via INTRAVENOUS

## 2018-02-20 MED ORDER — PROCHLORPERAZINE MALEATE 10 MG PO TABS: 10.0000 mg | ORAL_TABLET | Freq: Four times a day (QID) | ORAL | 0 refills | Status: AC | PRN

## 2018-02-20 MED ORDER — PALONOSETRON HCL INJECTION 0.25 MG/5ML
INTRAVENOUS | Status: AC
  Filled 2018-02-20: qty 5

## 2018-02-20 MED ORDER — ATROPINE SULFATE 1 MG/ML IJ SOLN
0.5000 mg | Freq: Once | INTRAMUSCULAR | Status: AC | PRN
  Administered 2018-02-20: 0.5 mg via INTRAVENOUS

## 2018-02-20 MED ORDER — DEXAMETHASONE SODIUM PHOSPHATE 10 MG/ML IJ SOLN
INTRAMUSCULAR | Status: AC
  Filled 2018-02-20: qty 1

## 2018-02-20 MED ORDER — ATROPINE SULFATE 1 MG/ML IJ SOLN
INTRAMUSCULAR | Status: AC
  Filled 2018-02-20: qty 1

## 2018-02-20 MED ORDER — IRINOTECAN HCL CHEMO INJECTION 100 MG/5ML
140.0000 mg/m2 | Freq: Once | INTRAVENOUS | Status: AC
  Administered 2018-02-20: 260 mg via INTRAVENOUS
  Filled 2018-02-20: qty 13

## 2018-02-20 MED ORDER — SODIUM CHLORIDE 0.9 % IV SOLN: Freq: Once | INTRAVENOUS | Status: AC

## 2018-02-20 MED ORDER — PANITUMUMAB CHEMO INJECTION 100 MG/5ML
6.0000 mg/kg | Freq: Once | INTRAVENOUS | Status: AC
  Administered 2018-02-20: 500 mg via INTRAVENOUS
  Filled 2018-02-20: qty 5

## 2018-02-20 MED ORDER — SODIUM CHLORIDE 0.9 % IV SOLN
Freq: Once | INTRAVENOUS | Status: AC
  Administered 2018-02-20: 11:00:00 via INTRAVENOUS

## 2018-02-20 NOTE — Telephone Encounter (Signed)
Appointments added per 7/12 los. Schedule printed and was given to patient while in infusion

## 2018-02-20 NOTE — Progress Notes (Signed)
DISCONTINUE ON PATHWAY REGIMEN - Colorectal     A cycle is every 21 days:     Capecitabine   **Always confirm dose/schedule in your pharmacy ordering system**  REASON: Other Reason PRIOR TREATMENT: [id number]: Capecitabine 1,000 mg/m2 PO BID for 14 Days, q21 Days TREATMENT RESPONSE: Stable Disease (SD)  START OFF PATHWAY REGIMEN - Colorectal   [id number]:Irinotecan 180 mg/m2 q14 Days:   A cycle is every 14 days:     Irinotecan   **Always confirm dose/schedule in your pharmacy ordering system**  Patient Characteristics: Metastatic Colorectal, Second Line, KRAS/NRAS Wild-Type, BRAF Wild-Type/Unknown, Prior Anti-EGFR Therapy, Bevacizumab Ineligible Current evidence of distant metastases<= Yes AJCC T Category: TX AJCC N Category: NX AJCC M Category: M1b AJCC 8 Stage Grouping: IVB BRAF Mutation Status: Wild Type (no mutation) KRAS/NRAS Mutation Status: Wild Type (no mutation) Line of therapy: Second Line  Intent of Therapy: Non-Curative / Palliative Intent, Discussed with Patient

## 2018-02-20 NOTE — Patient Instructions (Addendum)
[facility] Discharge Instructions for Patients Receiving Chemotherapy  Today you received the following chemotherapy agents:  VECTIBIX and IRINOTECAN.  To help prevent nausea and vomiting after your treatment, we encourage you to take your nausea medication as directed.   If you develop nausea and vomiting that is not controlled by your nausea medication, call the clinic.   BELOW ARE SYMPTOMS THAT SHOULD BE REPORTED IMMEDIATELY:  *FEVER GREATER THAN 100.5 F  *CHILLS WITH OR WITHOUT FEVER  NAUSEA AND VOMITING THAT IS NOT CONTROLLED WITH YOUR NAUSEA MEDICATION  *UNUSUAL SHORTNESS OF BREATH  *UNUSUAL BRUISING OR BLEEDING  TENDERNESS IN MOUTH AND THROAT WITH OR WITHOUT PRESENCE OF ULCERS  *URINARY PROBLEMS  *BOWEL PROBLEMS  UNUSUAL RASH Items with * indicate a potential emergency and should be followed up as soon as possible.  Feel free to call the clinic should you have any questions or concerns. The clinic phone number is [phone].  Please show the [place] at check-in to the Emergency Department and triage nurse.  Irinotecan injection What is this medicine? IRINOTECAN (ir in oh TEE kan ) is a chemotherapy drug. It is used to treat colon and rectal cancer. This medicine may be used for other purposes; ask your health care provider or pharmacist if you have questions. COMMON BRAND NAME(S): Camptosar What should I tell my health care provider before I take this medicine? They need to know if you have any of these conditions: -blood disorders -dehydration -diarrhea -infection (especially a virus infection such as chickenpox, cold sores, or herpes) -liver disease -low blood counts, like low white cell, platelet, or red cell counts -recent or ongoing radiation therapy -an unusual or allergic reaction to irinotecan, sorbitol, other chemotherapy, other medicines, foods, dyes, or preservatives -pregnant or trying to get  pregnant -breast-feeding How should I use this medicine? This drug is given as an infusion into a vein. It is administered in a hospital or clinic by a specially trained health care professional. Talk to your pediatrician regarding the use of this medicine in children. Special care may be needed. Overdosage: If you think you have taken too much of this medicine contact a poison control center or emergency room at once. NOTE: This medicine is only for you. Do not share this medicine with others. What if I miss a dose? It is important not to miss your dose. Call your doctor or health care professional if you are unable to keep an appointment. What may interact with this medicine? Do not take this medicine with any of the following medications: -atazanavir -certain medicines for fungal infections like itraconazole and ketoconazole -St. John's Wort This medicine may also interact with the following medications: -dexamethasone -diuretics -laxatives -medicines for seizures like carbamazepine, mephobarbital, phenobarbital, phenytoin, primidone -medicines to increase blood counts like filgrastim, pegfilgrastim, sargramostim -prochlorperazine -vaccines This list may not describe all possible interactions. Give your health care provider a list of all the medicines, herbs, non-prescription drugs, or dietary supplements you use. Also tell them if you smoke, drink alcohol, or use illegal drugs. Some items may interact with your medicine. What should I watch for while using this medicine? Your condition will be monitored carefully while you are receiving this medicine. You will need important blood work done while you are taking this medicine. This drug may make you feel generally unwell. This is not uncommon, as chemotherapy can affect healthy cells as well as cancer cells. Report any side effects. Continue your course of treatment even  though you feel ill unless your doctor tells you to stop. In some  cases, you may be given additional medicines to help with side effects. Follow all directions for their use. You may get drowsy or dizzy. Do not drive, use machinery, or do anything that needs mental alertness until you know how this medicine affects you. Do not stand or sit up quickly, especially if you are an older patient. This reduces the risk of dizzy or fainting spells. Call your doctor or health care professional for advice if you get a fever, chills or sore throat, or other symptoms of a cold or flu. Do not treat yourself. This drug decreases your body's ability to fight infections. Try to avoid being around people who are sick. This medicine may increase your risk to bruise or bleed. Call your doctor or health care professional if you notice any unusual bleeding. Be careful brushing and flossing your teeth or using a toothpick because you may get an infection or bleed more easily. If you have any dental work done, tell your dentist you are receiving this medicine. Avoid taking products that contain aspirin, acetaminophen, ibuprofen, naproxen, or ketoprofen unless instructed by your doctor. These medicines may hide a fever. Do not become pregnant while taking this medicine. Women should inform their doctor if they wish to become pregnant or think they might be pregnant. There is a potential for serious side effects to an unborn child. Talk to your health care professional or pharmacist for more information. Do not breast-feed an infant while taking this medicine. What side effects may I notice from receiving this medicine? Side effects that you should report to your doctor or health care professional as soon as possible: -allergic reactions like skin rash, itching or hives, swelling of the face, lips, or tongue -low blood counts - this medicine may decrease the number of white blood cells, red blood cells and platelets. You may be at increased risk for infections and bleeding. -signs of infection  - fever or chills, cough, sore throat, pain or difficulty passing urine -signs of decreased platelets or bleeding - bruising, pinpoint red spots on the skin, black, tarry stools, blood in the urine -signs of decreased red blood cells - unusually weak or tired, fainting spells, lightheadedness -breathing problems -chest pain -diarrhea -feeling faint or lightheaded, falls -flushing, runny nose, sweating during infusion -mouth sores or pain -pain, swelling, redness or irritation where injected -pain, swelling, warmth in the leg -pain, tingling, numbness in the hands or feet -problems with balance, talking, walking -stomach cramps, pain -trouble passing urine or change in the amount of urine -vomiting as to be unable to hold down drinks or food -yellowing of the eyes or skin Side effects that usually do not require medical attention (report to your doctor or health care professional if they continue or are bothersome): -constipation -hair loss -headache -loss of appetite -nausea, vomiting -stomach upset This list may not describe all possible side effects. Call your doctor for medical advice about side effects. You may report side effects to FDA at 1-800-FDA-1088. Where should I keep my medicine? This drug is given in a hospital or clinic and will not be stored at home. NOTE: This sheet is a summary. It may not cover all possible information. If you have questions about this medicine, talk to your doctor, pharmacist, or health care provider.  2018 [employer]vier/Gold Standard (2013-01-25 16:29:32)

## 2018-02-20 NOTE — Telephone Encounter (Signed)
Unable to complete 7/12 los due to infusion/MD appointments not available/  Patient added to infusion log and IB message sent to Dr. Burr Medico regarding moving treatment from 7/26 to 7/29 per daughter in laws request

## 2018-03-04 NOTE — Progress Notes (Signed)
[place]  [phone] [phone]  Clinic Follow Up Note   Patient Care Team: Moshe Cipro, MD as PCP - General (Internal Medicine) Alexis Frock, MD as Consulting Physician (Urology) Michael Boston, MD as Consulting Physician (General Surgery) Toma Deiters, MD as Referring Physician (Gastroenterology)   Date of Service:  03/09/2018  CHIEF COMPLAINTS:  Follow up Metastatic sigmoid colon Adenocarcinoma  Oncology History   Cancer Staging Cancer of left colon [facility]) Staging form: Colon and Rectum, AJCC 8th Edition - Clinical stage from 09/04/2016: Stage IVB (cTX, cNX, pM1b) - Signed by Truitt Merle, MD on 11/12/2016       Cancer of sigmoid colon ([place])   09/02/2016 Procedure    Colonoscopy Diverticulosis (scattered). Pt has about 10 cm structure starting at 20 cm with abnormal mucosa. Biopsies taken.       09/02/2016 Pathology Results    MODERATELY DIFFERENTIATED ADENOCARCINOMA OF COLON AT 20 CM      09/04/2016 Initial Diagnosis    Cancer of left colon ([place])      09/06/2016 Imaging    CT CAP w Contrast 1. No acute abdominopelvic process.  2. Heterogenous hypodense area involving the medial edge of the left kidney with multiple small soft tissue like stranding involving the left pararenal space. Findings are suspicious for malignancy of the left kidney with small pararenal metastases. MRI abdomen with contrast should be considered to further evaluate.  3. Vague hypodensity involving the lateral segment of the liver. This can also be reevaluated with MRI.  4. Partially visualized 5 mm posterior right lower lobe nodular density. CT chest without contrast follow up is recommended.  5. Complex multilobular left ovarian lesion may reflect a cluster of cysts.      10/10/2016 Procedure    Operative Report by Dr. Tresa Moore on 10/18/16 Procedure: 1) Cystoscopy, left retrograde pyelogram interpretation. 2) Left diagnostic uteroscopy. 3) Insertion of  left uteral stent; 5 x 24 Polaris with tether. Findings: 1) Unremarkable bladder. 2) Unremarkable left retrograde pyelogram. 3) No evidence of intraluminal urothelial neoplasm whatsoever with insepction of the left kidney and ureter times several. 4) Successful placement of left ureteral stent, proximal in renal pelvis and distal in urinary bladder.      10/15/2016 Imaging    MRI abdomen pelvis 1. There are 2 enhancing hepatic lesions highly worrisome for metastatic disease, likely metastatic colon cancer. 2. The lesion of concern in the anterior suprahilar lip of the left kidney is also enhancing, worrisome for neoplasm. This has a somewhat atypical appearance for renal cell carcinoma and could reflect a metastasis as well. 3. New left-sided hydroureter with delayed contrast excretion consistent with distal ureteral obstruction. Specific etiology uncertain. 4. Complex cystic and solid left adnexal lesion could reflect cystic neoplasm, although does not appear aggressive or appear to reflect the cause of the distal left ureteral obstruction. 5. Sigmoid colon wall thickening could be related to reported newly diagnosed colon cancer.      11/06/2016 PET scan    PET 11/06/2016 IMPRESSION: 1. Hypermetabolic pulmonary and hepatic lesions, most consistent with metastatic colon cancer, with the hypermetabolic primary located in the sigmoid colon. 2. Left renal and left adnexal lesions, better seen and evaluated on 10/15/2016. 3. Persistent moderate left hydronephrosis. 4. Aortic atherosclerosis (ICD10-170.0).      11/11/2016 Pathology Results    Liver, needle/core biopsy, left lobe - METASTATIC ADENOCARCINOMA. The histologic features are consistent with a primary gastrointestinal adenocarcinoma.      05/19/2017 Imaging  CT Abdomen Pelvis, 05/19/2017 IMPRESSION: 1. Worsening pulmonary and hepatic metastatic disease. Primary sigmoid lesion is stable to minimally enlarged. 2. Stable  to slight enlargement of a left perinephric mass, likely a metastasis. Renal cell carcinoma is not excluded. Associated pelvocaliectasis versus mild hydronephrosis. 3. Borderline left periaortic lymph nodes, slightly enlarged. 4. Cholelithiasis. 5.  Aortic atherosclerosis (ICD10-170.0). 6. Cystic and solid lesion in the left adnexa, as on prior exams, likely ovarian in origin.      06/06/2017 - 01/16/2018 Chemotherapy    IV antibody Vectibix every [redacted] weeks along with oral chemo Xeloda for 1 week on and 1 week off starting 06/06/17. Due to skin toxicities we reduced Xeloda to 2000 mg in the AM and 1558m in the PM 1 week on and 1 week off on 10/31/17. She had multiple treatment break due to hospitalization. Restarted treatment on 12/26/17. Chemo break 01/16/18-02/20/18      08/07/2017 - 08/16/2017 Hospital Admission    Admit date: 08/07/17 Admission diagnosis: Bowel Obstruction  Additional comments: She was admitted to the hospital on 08/07/17 after [facility] due to a bowel obstruction.  She had a sigmoid colon stent placement on 08/11/17. Bowel obstruction resolved.  CT scan in hospital showed great partial response to chemotherapy. She will continue chemotherapy given good overall tolerance.        08/07/2017 Imaging    CT AP W Contrast 08/07/17 IMPRESSION: Low colonic obstruction secondary to a rectosigmoid stricture at the site of the patient's prior colonic mass, which is no longer evident on CT. Mild upstream colonic wall thickening raises the possibility of superimposed colitis.  Improving hepatic metastases, as above. Left perirenal metastasis, decreased. Visualized pulmonary metastases at the lung bases are grossly unchanged.  3.5 cm mixed cystic/solid left ovarian mass is mildly decreased.       08/11/2017 Procedure    By Dr. MWatt Climes Flexible Sigmoidoscopy 08/11/17 IMPRESSION:   - Preparation of the colon was fair. - Likely malignant partially  obstructing tumor in the recto-sigmoid colon versus necrotic scarring. Injected with contrast proximal to obstruction to confirm proper position. Prosthesis placed. - Stool in the rectum. - No specimens collected.       10/22/2017 Imaging    CT CAP W Contrast 10/22/17 IMPRESSION: 1. Significant interval improvement and liver metastasis. 2. The left perinephric mass is slightly decreased in size in the interval. 3. No significant interval change in the appearance of multiple pulmonary metastasis. 4. Stable appearance of mediastinal adenopathy. 5. Gallstones 6.  Aortic Atherosclerosis (ICD10-I70.0). 7. Colonic stent remains in place without evidence for bowel obstruction.      12/01/2017 - 12/07/2017 Hospital Admission    Admit date: 12/01/17 Admission diagnosis: Bowel Obstruction and abdominal pain Additional comments: had flexible sigmoidoscopy by Dr. MWatt Climeson 12/04/17. Chemo was held during this time.       12/04/2017 Procedure    Flexible Sigmoidoscopy 12/04/17 IMPRESSION - Preparation of the colon was fair. - Stent in the colon. - Stricture in the distal sigmoid colon. Prosthesis placed. - No specimens collected.      02/18/2018 Imaging    CT CAP W Contrast 02/18/18 IMPRESSION: CT CHEST IMPRESSION 1. Bilateral pulmonary metastasis. Compared to the 12/01/2017 abdominal CT, mixed response to therapy. Compared to the 10/22/2017 chest CT, overall slight progression. 2. Similar thoracic adenopathy. 3.  Aortic Atherosclerosis (ICD10-I70.0). CT ABDOMEN AND PELVIS IMPRESSION 1. Since 12/01/2017, similar hepatic metastatic burden. Left renal/perirenal lesion is similar to minimally decreased in size. 2.  Sigmoid colonic stent in place with resolution of colonic obstruction. 3. Similar mild soft tissue fullness within both ovaries.      02/20/2018 -  Chemotherapy    second line Irinotecan and Victibix every 2 weeks on 02/20/2018       HISTORY OF PRESENTING ILLNESS  (10/25/2016):  Mary [name] 84 y.o. female is here because of adenocarcinoma of the colon and a left renal mass. The pt noticed worsening episodes of abdominal cramping and nausea with bowel movements in July 2017. She had an abdominal US performed, which was unremarkable. A colonoscopy was then performed at the patient's request, which found a mass about 20 cm from the anal verge. Biopsy showed moderately differentiated adenocarcinoma. MRI done, with a lesion noticed in the left kidney near the pelvis, a 11 mm hepatic lesion, 4 mm lung nodule, and possible cystic left ovarian mass. Pt saw Dr. Johney Maine for information on abdominal surgery for her colon, which she declined. She also saw Dr. Lorna Dibble, who is considering a nephrectomy, but is planning a sternoscopy to better characterize the kidney lesion. She presents today for continued treatment.    She presents today with her son and daughter-in-law. The symptoms started in late July 2017. She had some vomiting, abdominal pain, and constipation, but not bleeding. Her PCP never found any anemia. Initially she lost her appetite, but now she is taking multiple vitamins and supplements, so her appetite is back. Her bowel has improved, but she still has abdominal pain that comes and goes, lasting minutes. The pain is tolerable with aleve. She did not tolerate Tramadol well; nausea and vomiting. She has some occasional left sides back pain. Denies weight loss, or any other concerns. She is able to do everything she needs to around the house.   She had a partial hysterectomy, and a lumpectomy. She has a history of HTN, atrial fibrillation, high cholesterol. She has some hearing loss and can not see out of her right eye. Not a diabetic, but her son and her mother are. No family history of colon cancer. Her sister had breast cancer in her 44's and her brother had lung cancer in his 18's. Never smoker. Doesn't drink alcohol. 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 She has  two sons; one lives an hour away, and the other lives in [place]. She lives alone.   CURRENT THERAPY:  Second line Irinotecan and Victibix every 2 weeks started on 02/20/2018  INTERVAL HISTORY:   SHAVELLE RUNKEL is here for a follow up and cycle 2 second-line treatment. She presents to the clinic today accompanied by her daughter-in-law. She notes she managed her last cycle chemo with nausea and fatigue. She was able to do all what she needs with naps and much slower. Her fatigue improved after 3 days. She had some diarrhea but was mild enough for her to continue her regular stool softener. She is able to drink enough water. She notes she has not had any pain and has not needed to take pain medication.   Her daughter-in-law notes she has gotten better. But notes she will always remain concerned even if patient is doing well.  She notes her son was hospitalized for 2 weeks in a row.     MEDICAL HISTORY:  Past Medical History:  Diagnosis Date  . Aneurysm of ascending aorta ([facility]) 12/03/2016   3.7 cm by echo 10/2016  . Colon cancer ([place])    dx via biospy from colonoscopy-- malignant ---  currently having work-up  done w/ dr gross (general surgeon) and coordinate surgery w/ urologsit for renal tumor  . Essential hypertension 10/31/2016  . GERD (gastroesophageal reflux disease)   . Glaucoma   . Hiatal hernia   . HOH (hard of hearing)    bilateral even w/ hearing aids  . Hyperlipidemia   . Kidney tumor    left side  . Legally blind in right eye, as defined in [place]    due to macular degeneration  . Macular degeneration   . Nausea    intermittant due to colon mass  . Ovarian cyst   . Persistent atrial fibrillation ([place]) 10/31/2016   on ASA only  . PONV (postoperative nausea and vomiting)   . Wears glasses   . Wears hearing aid    bilateral    SURGICAL HISTORY: Past Surgical History:  Procedure Laterality Date  . BREAST CYST EXCISION  1990   benign  . COLONIC STENT PLACEMENT N/A  12/04/2017   Procedure: COLONIC STENT PLACEMENT;  Surgeon: Clarene Essex, MD;  Location: [facility];  Service: Endoscopy;  Laterality: N/A;  . COLONOSCOPY  09/04/2016  . CYSTOSCOPY WITH RETROGRADE PYELOGRAM, URETEROSCOPY AND STENT PLACEMENT Left 10/10/2016   Procedure: CYSTOSCOPY WITH RETROGRADE PYELOGRAM, DIAGNOSTIC URETEROSCOPY  AND STENT PLACEMENT;  Surgeon: Alexis Frock, MD;  Location: [facility];  Service: Urology;  Laterality: Left;  . ESOPHAGOGASTRODUODENOSCOPY  11/07/2014  . FLEXIBLE SIGMOIDOSCOPY N/A 08/11/2017   Procedure: FLEXIBLE SIGMOIDOSCOPY;  Surgeon: Clarene Essex, MD;  Location: [facility];  Service: Endoscopy;  Laterality: N/A;  With Fluoroscopy for colonic stent placement for colonic stricture  . FLEXIBLE SIGMOIDOSCOPY N/A 12/04/2017   Procedure: FLEXIBLE SIGMOIDOSCOPY;  Surgeon: Clarene Essex, MD;  Location: [facility];  Service: Endoscopy;  Laterality: N/A;  . NASAL SINUS SURGERY    . TONSILLECTOMY      SOCIAL HISTORY: Social History   Socioeconomic History  . Marital status: Widowed    Spouse name: Not on file  . Number of children: Not on file  . Years of education: Not on file  . Highest education level: Not on file  Occupational History  . Not on file  Social Needs  . Financial resource strain: Not on file  . Food insecurity:    Worry: Not on file    Inability: Not on file  . Transportation needs:    Medical: Not on file    Non-medical: Not on file  Tobacco Use  . Smoking status: Never Smoker  . Smokeless tobacco: Never Used  Substance and Sexual Activity  . Alcohol use: No  . Drug use: No  . Sexual activity: Not on file  Lifestyle  . Physical activity:    Days per week: Not on file    Minutes per session: Not on file  . Stress: Not on file  Relationships  . Social connections:    Talks on phone: Not on file    Gets together: Not on file    Attends religious service: Not on file    Active member of club or organization: Not on  file    Attends meetings of clubs or organizations: Not on file    Relationship status: Not on file  . Intimate partner violence:    Fear of current or ex partner: Not on file    Emotionally abused: Not on file    Physically abused: Not on file    Forced sexual activity: Not on file  Other Topics Concern  . Not on file  Social  History Narrative  . Not on file    FAMILY HISTORY: Family History  Problem Relation Age of Onset  . Cancer Sister 13       breast cancer   . Cancer Brother 66       lung cancer   . Diabetes Son   . High blood pressure Son   . Peripheral Artery Disease Mother   . High blood pressure Mother     ALLERGIES:  is allergic to benazepril; tylenol [acetaminophen]; and bactrim [sulfamethoxazole-trimethoprim].  MEDICATIONS:  Current Outpatient Medications  Medication Sig Dispense Refill  . amLODipine (NORVASC) 5 MG tablet Take 1 tablet (5 mg total) by mouth daily. 90 tablet 1  . Bisacodyl (DULCOLAX PO) Take 2 tablets by mouth 2 (two) times daily.    . bisacodyl (DULCOLAX) 10 MG suppository Place 10 mg rectally as needed for moderate constipation.    . capecitabine (XELODA) 500 MG tablet Take 4 tablets ( 2000 mg ) in  AM ;  3 tablets ( 1500 mg ) in  PM for  7 days on ,  Off 7 days. 98 tablet 2  . diltiazem (CARDIZEM CD) 240 MG 24 hr capsule Take 1 capsule (240 mg total) by mouth daily. 90 capsule 1  . escitalopram (LEXAPRO) 10 MG tablet Take 10 mg by mouth daily.    . fexofenadine (ALLEGRA) 180 MG tablet Take 180 mg by mouth daily.    Marland Kitchen latanoprost (XALATAN) 0.005 % ophthalmic solution Place 1 drop into both eyes at bedtime.    Marland Kitchen losartan (COZAAR) 100 MG tablet Take 1 tablet (100 mg total) by mouth daily. 90 tablet 0  . Multiple Vitamins-Minerals (VITEYES AREDS ADVANCED) CAPS Take 1 capsule by mouth daily.    Marland Kitchen omeprazole (PRILOSEC) 40 MG capsule Take 40 mg by mouth daily.    . ondansetron (ZOFRAN ODT) 4 MG disintegrating tablet Take 1 tablet (4 mg total) by  mouth every 8 (eight) hours as needed for nausea or vomiting. 30 tablet 0  . oxyCODONE (OXY IR/ROXICODONE) 5 MG immediate release tablet Take 1 tablet (5 mg total) by mouth every 6 (six) hours as needed for moderate pain or severe pain. 90 tablet 0  . Polyethyl Glycol-Propyl Glycol (SYSTANE) 0.4-0.3 % SOLN Apply 1 drop to eye daily as needed (dry eyes).     . polyethylene glycol (MIRALAX / GLYCOLAX) packet Take 17 g by mouth daily as needed for moderate constipation. 30 each 0  . Potassium Chloride ER 20 MEQ TBCR Take 20 mEq by mouth daily. 30 tablet 0  . prochlorperazine (COMPAZINE) 10 MG tablet Take 1 tablet (10 mg total) by mouth every 6 (six) hours as needed for nausea or vomiting. 30 tablet 0  . sennosides-docusate sodium (SENOKOT-S) 8.6-50 MG tablet Take 1-2 tablets by mouth 3 (three) times daily.    . urea (CARMOL) 10 % cream Apply topically as needed. 71 g 0   Current Facility-Administered Medications  Medication Dose Route Frequency Provider Last Rate Last Dose  . sodium chloride flush (NS) 0.9 % injection 3 mL  3 mL Intravenous PRN Skeet Latch, MD        REVIEW OF SYSTEMS:   Constitutional: Denies fevers, chills or abnormal  Eyes: Denies blurriness of vision, double vision or watery eyes Ears, nose, mouth, throat, and face: Denies mucositis or sore throat Respiratory: Denies cough, dyspnea or wheezes Cardiovascular: Denies palpitation, chest discomfort  Gastrointestinal:  Denies heartburn (+) hard stool, improved with mild diarrhea  Skin: Denies rashes  Lymphatics: Denies new lymphadenopathy or easy bruising MSK: negative Neurological:Denies numbness, tingling or new weaknesses Behavioral/Psych: Mood is stable, no new changes  All other systems were reviewed with the patient and are negative.  PHYSICAL EXAMINATION:  ECOG PERFORMANCE STATUS: 1 - Symptomatic but completely ambulatory   Vitals:   03/09/18 1116  BP: 134/67  Pulse: 72  Resp: 16  Temp: 98.4 F (36.9 C)    TempSrc: Oral  SpO2: 98%  Weight: 173 lb 14.4 oz (78.9 kg)  Height: _0  (1.676 m)     GENERAL:alert, no distress and comfortable SKIN: skin color, texture, turgor are normal  EYES: normal, conjunctiva are pink and non-injected, sclera clear OROPHARYNX:no exudate, no erythema and lips, buccal mucosa, and tongue normal  NECK: supple, thyroid normal size, non-tender, without nodularity LYMPH:  no palpable lymphadenopathy in the cervical, axillary or inguinal LUNGS: clear to auscultation and percussion with normal breathing effort HEART: regular rate & rhythm and no murmurs and   ABDOMEN:abdomen soft, mild tenderness at LLQ and right side of abdomen,  (+) active bowel sounds Musculoskeletal:no cyanosis of digits and no clubbing  PSYCH: alert & oriented x 3 with fluent speech NEURO: no focal motor/sensory deficits   LABORATORY DATA:  I have reviewed the data as listed CBC Latest Ref Rng & Units 03/09/2018 02/18/2018 01/09/2018  WBC 3.9 - 10.3 K/uL 7.5 9.5 9.2  Hemoglobin 11.6 - 15.9 g/dL 11.3(L) 12.5 11.9  Hematocrit 34.8 - 46.6 % 35.4 38.0 37.3  Platelets 145 - 400 K/uL 347 393 337   CMP Latest Ref Rng & Units 03/09/2018 02/18/2018 01/09/2018  Glucose 70 - 99 mg/dL 91 91 78  BUN 8 - 23 mg/dL _1 Creatinine 0.44 - 1.00 mg/dL 0.84 1.08(H) 0.87  Sodium 135 - 145 mmol/L 139 139 137  Potassium 3.5 - 5.1 mmol/L 3.7 4.5 3.9  Chloride 98 - 111 mmol/L 105 104 105  CO2 22 - 32 mmol/L _2 Calcium 8.9 - 10.3 mg/dL 9.5 9.7 9.1  Total Protein 6.5 - 8.1 g/dL 7.4 8.7(H) 7.4  Total Bilirubin 0.3 - 1.2 mg/dL 0.3 0.5 0.6  Alkaline Phos 38 - 126 U/L 86 101 88  AST 15 - 41 U/L 14(L) 15 18  ALT 0 - 44 U/L _3 PATHOLOGY: [facility] 11/11/16   Diagnosis 11/11/16 Liver, needle/core biopsy, left lobe - METASTATIC ADENOCARCINOMA. - SEE COMMENT. Microscopic Comment The histologic features are consistent with a primary gastrointestinal adenocarcinoma. Dr. Vicente Males has  reviewed the case and concurs with this interpretation. There is likely sufficient tumor present for additional studies, if requested. Enid Cutter MD Pathologist, Electronic Signature (Case signed 11/12/2016)  Diagnosis 09/04/2016 MODERATELY DIFFERENTIATED ADENOCARCINOMA OF COLON AT 20 CM Tumor Site: Colon at 20 cm Tumor Type: Adenocarcinoma Tumore Size: Unknown  Grade: Moderately differentiated   RADIOGRAPHIC STUDIES: I have personally reviewed the radiological images as listed and agreed with the findings in the report.   CT CAP W Contrast 02/18/18 IMPRESSION: CT CHEST IMPRESSION 1. Bilateral pulmonary metastasis. Compared to the 12/01/2017 abdominal CT, mixed response to therapy. Compared to the 10/22/2017 chest CT, overall slight progression. 2. Similar thoracic adenopathy. 3.  Aortic Atherosclerosis (ICD10-I70.0). CT ABDOMEN AND PELVIS IMPRESSION 1. Since 12/01/2017, similar hepatic metastatic burden. Left renal/perirenal lesion is similar to minimally decreased in size. 2. Sigmoid colonic stent in place with resolution of colonic obstruction. 3. Similar mild soft tissue fullness within both ovaries.   DG Abdomen 11/28/17 IMPRESSION:  Stent within the sigmoid colon. Gaseous distention of bowel above the stent concerning for a degree of bowel obstruction.  CT CAP W Contrast 10/22/17 IMPRESSION: 1. Significant interval improvement and liver metastasis. 2. The left perinephric mass is slightly decreased in size in the interval. 3. No significant interval change in the appearance of multiple pulmonary metastasis. 4. Stable appearance of mediastinal adenopathy. 5. Gallstones 6.  Aortic Atherosclerosis (ICD10-I70.0). 7. Colonic stent remains in place without evidence for bowel obstruction.  CT AP W Contrast 08/07/17 IMPRESSION: Low colonic obstruction secondary to a rectosigmoid stricture at the site of the patient's prior colonic mass, which is no longer evident on CT.  Mild upstream colonic wall thickening raises the possibility of superimposed colitis.Improving hepatic metastases, as above. Left perirenal metastasis, decreased. Visualized pulmonary metastases at the lung bases are grossly unchanged. 3.5 cm mixed cystic/solid left ovarian mass is mildly decreased.  CT Chest 06/06/17  IMPRESSION: 1. There are multiple new and enlarging pulmonary nodules throughout the lungs bilaterally. Additionally, a few of the nodules have decreased in size when compared to prior. 2. Interval increase in size of precarinal lymph node. 3. Interval increase in size of hepatic lesions. 4. Aortic Atherosclerosis (ICD10-I70.0).  CT Abdomen Pelvis, 05/19/2017 IMPRESSION: 1. Worsening pulmonary and hepatic metastatic disease. Primary sigmoid lesion is stable to minimally enlarged. 2. Stable to slight enlargement of a left perinephric mass, likely a metastasis. Renal cell carcinoma is not excluded. Associated pelvocaliectasis versus mild hydronephrosis. 3. Borderline left periaortic lymph nodes, slightly enlarged. 4. Cholelithiasis. 5.  Aortic atherosclerosis (ICD10-170.0). 6. Cystic and solid lesion in the left adnexa, as on prior exams, likely ovarian in origin.  PET 11/06/2016 IMPRESSION: 1. Hypermetabolic pulmonary and hepatic lesions, most consistent with metastatic colon cancer, with the hypermetabolic primary located in the sigmoid colon. 2. Left renal and left adnexal lesions, better seen and evaluated on 10/15/2016. 3. Persistent moderate left hydronephrosis. 4. Aortic atherosclerosis (ICD10-170.0).  MRI Abdomen Pelvis w/wo Contrast 10/15/2016 IMPRESSION: 1. There are 2 enhancing hepatic lesions highly worrisome for metastatic disease, likely metastatic colon cancer. 2. The lesion of concern in the anterior suprahilar lip of the left kidney is also enhancing, worrisome for neoplasm. This has a somewhat atypical appearance for renal cell carcinoma and  could reflect a metastasis as well. 3. New left-sided hydroureter with delayed contrast excretion consistent with distal ureteral obstruction. Specific etiology uncertain. 4. Complex cystic and solid left adnexal lesion could reflect cystic neoplasm, although does not appear aggressive or appear to reflect the cause of the distal left ureteral obstruction. 5. Sigmoid colon wall thickening could be related to reported newly diagnosed colon cancer.  CT CAP w Contrast 09/06/2016 (done outside) IMPRESSION: 1. No acute abdominopelvic process.  2. Heterogenous hypodense area involving the medial edge of the left kidney with multiple small soft tissue like stranding involving the left pararenal space. Findings are suspicious for malignancy of the left kidney with small pararenal metastases. MRI abdomen with contrast should be considered to further evaluate.  3. Vague hypodensity involving the lateral segment of the liver. This can also be reevaluated with MRI.  4. Partially visualized 5 mm posterior right lower lobe nodular density. CT chest without contrast follow up is recommended.  5. Complex multilobular left ovarian lesion may reflect a cluster of cysts.   PROCEDURES  Flexible Sigmoidoscopy 12/04/17 IMPRESSION - Preparation of the colon was fair. - Stent in the colon. - Stricture in the distal sigmoid colon. Prosthesis placed. - No specimens collected.  Flexible Sigmoidoscopy 08/11/17 IMPRESSION:   - Preparation of the colon was fair. - Likely malignant partially obstructing tumor in the recto-sigmoid colon versus necrotic scarring. Injected with contrast proximal to obstruction to confirm proper position. Prosthesis placed. - Stool in the rectum. - No specimens collected.   Echocardiogram 11/03/16 - Left ventricle: The cavity size was normal. Wall thickness was   increased in a pattern of mild LVH. Indeterminate diastolic   function (atrial fibrillation). Systolic function was  normal. The   estimated ejection fraction was in the range of 60% to 65%.   Operative Report by Dr. Tresa Moore on 10/10/16 Procedure: 1) Cystoscopy, left retrograde pyelogram interpretation. 2) Left diagnostic uteroscopy. 3) Insertion of left uteral stent; 5 x 24 Polaris with tether. Findings: 1) Unremarkable bladder. 2) Unremarkable left retrograde pyelogram. 3) No evidence of intraluminal urothelial neoplasm whatsoever with inspection of the left kidney and ureter times several. 4) Successful placement of left ureteral stent, proximal in renal pelvis and distal in urinary bladder.  Colonoscopy 09/02/2016 FINDINGS: Diverticulosis (scattered). Pt has about 10 cm structure starting at 20 cm with abnormal mucosa. The colonoscopy scope was not able to advance due to structure, and upper endoscopy scope was used and it was advanced through the stricture to see ileocecal valve. Biopsies at the stricture were taken.   ASSESSMENT & PLAN:  Mary [name] is a 84 y.o. female   1. Adenocarcinoma of left colon, sigmoid colon, TxNxM1 with liver and lung mets, stage IV, KRAS/NRS wild type, MSI-stable  -We previously discussed her incurable nature of metastatic colon cancer, and the atypical of therapy. -She has been on first-line chemotherapy with chemotherapy and Panitumumab, tolerating very well overall, and has had partial response. -However she developed recurrent bowel obstruction secondary to the sigmoid colon mass, status post colonic stent placement twice by Dr. Watt Climes. -I previously reviewed her CT scan from 12/01/17 which showed her bowel obstruction as well as good response to treatment in her liver and lung mets.  -I previously discussed she may have recurrent bowel obstruction in the future, and he can follow-up with Dr. Watt Climes closely. Will watch her symptoms, especially constipation and abdominal pain closely. We also discussed the surgical option of diverting colostomy, she previously saw Dr. Johney Maine, she  would like to hold on surgery as long as she can.  -I recommended she continue with her systemic therapy given she is responding well. Pt agreed to continue treatment.  She restarted on 12/26/17. She took a chemo break after 01/16/18 then changed treatment.  -We discussed her CT CAP from 02/18/18 which showed stent adequately placed and no bowel obstruction. B/l lung mets have slight progression and her liver mets has slight decrease, overall stable disease.  -She now has mild disease progression in the lungs, and has had a recurrent biopsy option it is a primary sigmoid colon site, she has been on low-dose chemo overall, which I feel is not adequately controlled disease.  I discussed the option of changing treatment to intravenous chemo, such as in undertaken, continue current Xeloda and vectibix, versus palliative care and hospice.  She overall is elderly, does not want have side effects from chemo, is reluctant to take intensive chemotherapy such as FOLFOX or FOLFIRI.  A lengthy discussion, she agrees to change Xeloda to irinotecan, every 2 weeks, I will continue vectibix given her overall stable disease. Not a candidate for Avastin due to the recurrent bowel obstruction. Side effects were discussed with patient in great detail. She agrees to proceed  to start on 02/20/18. -I reduced irinotecan to 140 mg/m for first cycle. She tolerated first cycle moderately well with nausea, fatigue and mild diarrhea. She recovered well within a week, weight is stable. She would like to continue with treatment.  I will continue her current dose at 15m/m2.  -I discussed the more treatment she gets her fatigue will likely progress. I will reduce dose as needed and stop if she is not longer able to tolerate.  -Next scan in 05/2018.  -Labs reviewed, hg at 11.3, CMP is still pending. Overall adequate to proceed with irinotecan/vectibix same dose.  -We reviewed the potential side effects from chemo again, especially neutropenia  fever and precautions.  She voiced good understanding. -F/u in 2 weeks  -Plan to repeat staging scan after cycle 5  2. Constipation, abdominal pain  -I previously reviewed laxative use. She will take a daily dose of Miralax up to 3-4 times a day and if she does not have a BM for 3-4 days she will also use Senokot-S. -Pt notes Magnesium citrate caused significant bloating and she does not want to take it again.  -She is currently on oxycodone 3-4 times a day  -Due to recurrent bowel obstructions, on 12/04/17 Dr. MWatt Climesdid a stent placement.  -I previously recommend she increase her Senakot-S to 2-3 tabs BID and continue Miralax 30 minutes postprandial.  -Her BM have become more loose (mild diarrhea) due to chemo and her pain is resolved currently and not taking pain medication.    3. Left renal mass and left hydronephrosis -Possible renal cell carcinoma, metastatic lesion from colon cancer is also a possibility. -She has been seen by [facility], Dr. MTresa Moore and had left ureter stent placement on 10/10/2016.  -no signs of hydronephrosis on 02/18/18 CT scan, and a left renal mass is likely decreased but overall similar.   4. Complex cyst of left ovary -will monitor. Likely benign -If she undergoes abdominal surgery, may remove it -She declines surgery for now -08/07/17 CT shows mild decrease in size of cyst -02/18/18 CT shows overall stable cyst   5. Atrial Fibrillation -The patient has a history of atrial fibrillation. -Her cardiologist wants her to undergo cardio inversion and anticoagulation. -I previously advised the patient that even if she undergoes cardio inversion, her Afib might return. Also, her anticoagulation medication might make her bleeding worse. -The patient should continue taking baby aspirin.  6. HTN -I previously advised the patient to continue medications and follow up with primary care physician -BP normal in clinic lately   7. Nutrition -We previously discussed palliative  chemotherapy will make her fatigued, nauseous, and loss of appetite. -I previously stressed the importance of a healthy diet with plenty of protein. -I previously referred to Nutrition.  -She has been gaining weight lately   8.Goal of care discussion  -We previously discussed the incurable nature of her cancer, and the overall poor prognosis, especially if she does not have good response to chemotherapy or progress on chemo -The patient understands the goal of care is palliative. -I previously recommended DNR/DNI, she will think about it   9. Partial bowel obstruction, s/p sigmoid stent placement on 08/11/17 and again on 12/04/17 -Hospitalized on 08/07/17, and had stent placed on 08/11/17.  -She now had recurrent bowel obstruction as of 11/28/17. She was given lactulose and put on a liquid diet on 11/28/17.  -Without much relief she was admitted to the hospital on 12/01/17 for her bowel obstruction and abdominal pain.  -She  had a flexible sigmoidoscopy by Dr. Watt Climes on 12/04/17 and had stent placed.  -If endoscopy procedure is not able to resolve the obstruction, then surgery with diverting colostomy may be the only option. She is open to that.  -She will see Dr. Watt Climes in 4 months to monitor her stents, or sooner if needed   -Her pain resolved and stool softened due to current chemotherapy. I encouraged her to watch for signs of bowel obstruction.    Plan:  -Labs reviewed and adequate to proceed with irinotecan/Vectibix, at same dose as last cycle  -Lab, f/u and irinotecan/vectibix in 2 weeks    All questions were answered. The patient knows to call the clinic with any problems, questions or concerns. I spent a total of 30 minutes for her visit, more than 50% face-to-face counseling.  Oneal Deputy, am acting as scribe for Truitt Merle, MD.   I have reviewed the above documentation for accuracy and completeness, and I agree with the above.        Truitt Merle, MD 03/09/2018

## 2018-03-06 ENCOUNTER — Other Ambulatory Visit: Payer: Medicare Other

## 2018-03-06 ENCOUNTER — Ambulatory Visit: Payer: Medicare Other

## 2018-03-09 ENCOUNTER — Inpatient Hospital Stay: Payer: Medicare Other

## 2018-03-09 ENCOUNTER — Telehealth: Payer: Self-pay

## 2018-03-09 ENCOUNTER — Ambulatory Visit: Payer: Medicare Other | Admitting: Hematology

## 2018-03-09 ENCOUNTER — Inpatient Hospital Stay (HOSPITAL_BASED_OUTPATIENT_CLINIC_OR_DEPARTMENT_OTHER): Payer: Medicare Other | Admitting: Hematology

## 2018-03-09 ENCOUNTER — Encounter: Payer: Self-pay | Admitting: Hematology

## 2018-03-09 VITALS — BP 134/67 | HR 72 | Temp 98.4°F | Resp 16 | Ht 66.0 in | Wt 173.9 lb

## 2018-03-09 DIAGNOSIS — C7801 Secondary malignant neoplasm of right lung: Secondary | ICD-10-CM | POA: Diagnosis not present

## 2018-03-09 DIAGNOSIS — C7802 Secondary malignant neoplasm of left lung: Secondary | ICD-10-CM | POA: Diagnosis not present

## 2018-03-09 DIAGNOSIS — I10 Essential (primary) hypertension: Secondary | ICD-10-CM | POA: Diagnosis not present

## 2018-03-09 DIAGNOSIS — Z7189 Other specified counseling: Secondary | ICD-10-CM | POA: Diagnosis not present

## 2018-03-09 DIAGNOSIS — C787 Secondary malignant neoplasm of liver and intrahepatic bile duct: Secondary | ICD-10-CM

## 2018-03-09 DIAGNOSIS — C187 Malignant neoplasm of sigmoid colon: Secondary | ICD-10-CM

## 2018-03-09 DIAGNOSIS — Z5112 Encounter for antineoplastic immunotherapy: Secondary | ICD-10-CM | POA: Diagnosis not present

## 2018-03-09 DIAGNOSIS — Z7982 Long term (current) use of aspirin: Secondary | ICD-10-CM

## 2018-03-09 DIAGNOSIS — C186 Malignant neoplasm of descending colon: Secondary | ICD-10-CM

## 2018-03-09 DIAGNOSIS — Z5111 Encounter for antineoplastic chemotherapy: Secondary | ICD-10-CM | POA: Diagnosis not present

## 2018-03-09 DIAGNOSIS — I4891 Unspecified atrial fibrillation: Secondary | ICD-10-CM

## 2018-03-09 LAB — CBC WITH DIFFERENTIAL (CANCER CENTER ONLY)
BASOS ABS: 0 10*3/uL (ref 0.0–0.1)
Basophils Relative: 0 %
EOS ABS: 0.4 10*3/uL (ref 0.0–0.5)
EOS PCT: 5 %
HCT: 35.4 % (ref 34.8–46.6)
Hemoglobin: 11.3 g/dL — ABNORMAL LOW (ref 11.6–15.9)
Lymphocytes Relative: 27 %
Lymphs Abs: 2 10*3/uL (ref 0.9–3.3)
MCH: 29.6 pg (ref 25.1–34.0)
MCHC: 31.9 g/dL (ref 31.5–36.0)
MCV: 92.7 fL (ref 79.5–101.0)
Monocytes Absolute: 1.4 10*3/uL — ABNORMAL HIGH (ref 0.1–0.9)
Monocytes Relative: 18 %
Neutro Abs: 3.7 10*3/uL (ref 1.5–6.5)
Neutrophils Relative %: 50 %
PLATELETS: 347 10*3/uL (ref 145–400)
RBC: 3.82 MIL/uL (ref 3.70–5.45)
RDW: 14.8 % — ABNORMAL HIGH (ref 11.2–14.5)
WBC: 7.5 10*3/uL (ref 3.9–10.3)

## 2018-03-09 LAB — CMP (CANCER CENTER ONLY)
ALT: 13 U/L (ref 0–44)
AST: 14 U/L — AB (ref 15–41)
Albumin: 3.3 g/dL — ABNORMAL LOW (ref 3.5–5.0)
Alkaline Phosphatase: 86 U/L (ref 38–126)
Anion gap: 7 (ref 5–15)
BUN: 13 mg/dL (ref 8–23)
CO2: 27 mmol/L (ref 22–32)
CREATININE: 0.84 mg/dL (ref 0.44–1.00)
Calcium: 9.5 mg/dL (ref 8.9–10.3)
Chloride: 105 mmol/L (ref 98–111)
Glucose, Bld: 91 mg/dL (ref 70–99)
POTASSIUM: 3.7 mmol/L (ref 3.5–5.1)
SODIUM: 139 mmol/L (ref 135–145)
Total Bilirubin: 0.3 mg/dL (ref 0.3–1.2)
Total Protein: 7.4 g/dL (ref 6.5–8.1)

## 2018-03-09 LAB — MAGNESIUM: Magnesium: 1.8 mg/dL (ref 1.7–2.4)

## 2018-03-09 MED ORDER — DEXAMETHASONE SODIUM PHOSPHATE 10 MG/ML IJ SOLN
10.0000 mg | Freq: Once | INTRAMUSCULAR | Status: AC
  Administered 2018-03-09: 10 mg via INTRAVENOUS

## 2018-03-09 MED ORDER — SODIUM CHLORIDE 0.9 % IV SOLN
500.0000 mg | Freq: Once | INTRAVENOUS | Status: AC
  Administered 2018-03-09: 500 mg via INTRAVENOUS
  Filled 2018-03-09: qty 20

## 2018-03-09 MED ORDER — IRINOTECAN HCL CHEMO INJECTION 100 MG/5ML
140.0000 mg/m2 | Freq: Once | INTRAVENOUS | Status: AC
  Administered 2018-03-09: 260 mg via INTRAVENOUS
  Filled 2018-03-09: qty 13

## 2018-03-09 MED ORDER — ATROPINE SULFATE 1 MG/ML IJ SOLN
0.5000 mg | Freq: Once | INTRAMUSCULAR | Status: AC | PRN
  Administered 2018-03-09: 0.5 mg via INTRAVENOUS

## 2018-03-09 MED ORDER — DEXAMETHASONE SODIUM PHOSPHATE 10 MG/ML IJ SOLN
INTRAMUSCULAR | Status: AC
  Filled 2018-03-09: qty 1

## 2018-03-09 MED ORDER — SODIUM CHLORIDE 0.9 % IV SOLN
Freq: Once | INTRAVENOUS | Status: AC
  Administered 2018-03-09: 14:00:00 via INTRAVENOUS
  Filled 2018-03-09: qty 250

## 2018-03-09 MED ORDER — PALONOSETRON HCL INJECTION 0.25 MG/5ML
INTRAVENOUS | Status: AC
  Filled 2018-03-09: qty 5

## 2018-03-09 MED ORDER — PALONOSETRON HCL INJECTION 0.25 MG/5ML
0.2500 mg | Freq: Once | INTRAVENOUS | Status: AC
  Administered 2018-03-09: 0.25 mg via INTRAVENOUS

## 2018-03-09 MED ORDER — ATROPINE SULFATE 1 MG/ML IJ SOLN
INTRAMUSCULAR | Status: AC
  Filled 2018-03-09: qty 1

## 2018-03-09 NOTE — Patient Instructions (Signed)
[facility] Discharge Instructions for Patients Receiving Chemotherapy  Today you received the following chemotherapy agents: Vectibix, Irinotecan  To help prevent nausea and vomiting after your treatment, we encourage you to take your nausea medication as directed.   If you develop nausea and vomiting that is not controlled by your nausea medication, call the clinic.   BELOW ARE SYMPTOMS THAT SHOULD BE REPORTED IMMEDIATELY:  *FEVER GREATER THAN 100.5 F  *CHILLS WITH OR WITHOUT FEVER  NAUSEA AND VOMITING THAT IS NOT CONTROLLED WITH YOUR NAUSEA MEDICATION  *UNUSUAL SHORTNESS OF BREATH  *UNUSUAL BRUISING OR BLEEDING  TENDERNESS IN MOUTH AND THROAT WITH OR WITHOUT PRESENCE OF ULCERS  *URINARY PROBLEMS  *BOWEL PROBLEMS  UNUSUAL RASH Items with * indicate a potential emergency and should be followed up as soon as possible.  Feel free to call the clinic should you have any questions or concerns. The clinic phone number is [phone].  Please show the CHEMO ALERT CARD at check-in to the Emergency Department and triage nurse.   

## 2018-03-09 NOTE — Telephone Encounter (Signed)
Printed avs and calender of upcoming appointment. Per 7/29 los. Tried Set designer and [employer] not including the infusion on it. How ever I added the infusion until I confirm with feng. Per 7/29 los

## 2018-03-09 NOTE — Telephone Encounter (Signed)
Printed avs and calender of upcoming appointment. Per 7/29 los 

## 2018-03-10 ENCOUNTER — Other Ambulatory Visit: Payer: Self-pay | Admitting: Cardiovascular Disease

## 2018-03-20 NOTE — Progress Notes (Signed)
[place]  [phone] [phone]  Clinic Follow Up Note   Patient Care Team: Moshe Cipro, MD as PCP - General (Internal Medicine) Alexis Frock, MD as Consulting Physician (Urology) Michael Boston, MD as Consulting Physician (General Surgery) Toma Deiters, MD as Referring Physician (Gastroenterology)   Date of Service:  03/23/2018  CHIEF COMPLAINTS:  Follow up Metastatic sigmoid colon Adenocarcinoma  Oncology History   Cancer Staging Cancer of left colon [facility]) Staging form: Colon and Rectum, AJCC 8th Edition - Clinical stage from 09/04/2016: Stage IVB (cTX, cNX, pM1b) - Signed by Truitt Merle, MD on 11/12/2016       Cancer of sigmoid colon ([place])   09/02/2016 Procedure    Colonoscopy Diverticulosis (scattered). Pt has about 10 cm structure starting at 20 cm with abnormal mucosa. Biopsies taken.     09/02/2016 Pathology Results    MODERATELY DIFFERENTIATED ADENOCARCINOMA OF COLON AT 20 CM    09/04/2016 Initial Diagnosis    Cancer of left colon ([place])    09/06/2016 Imaging    CT CAP w Contrast 1. No acute abdominopelvic process.  2. Heterogenous hypodense area involving the medial edge of the left kidney with multiple small soft tissue like stranding involving the left pararenal space. Findings are suspicious for malignancy of the left kidney with small pararenal metastases. MRI abdomen with contrast should be considered to further evaluate.  3. Vague hypodensity involving the lateral segment of the liver. This can also be reevaluated with MRI.  4. Partially visualized 5 mm posterior right lower lobe nodular density. CT chest without contrast follow up is recommended.  5. Complex multilobular left ovarian lesion may reflect a cluster of cysts.    10/10/2016 Procedure    Operative Report by Dr. Tresa Moore on 10/18/16 Procedure: 1) Cystoscopy, left retrograde pyelogram interpretation. 2) Left diagnostic uteroscopy. 3) Insertion of left uteral  stent; 5 x 24 Polaris with tether. Findings: 1) Unremarkable bladder. 2) Unremarkable left retrograde pyelogram. 3) No evidence of intraluminal urothelial neoplasm whatsoever with insepction of the left kidney and ureter times several. 4) Successful placement of left ureteral stent, proximal in renal pelvis and distal in urinary bladder.    10/15/2016 Imaging    MRI abdomen pelvis 1. There are 2 enhancing hepatic lesions highly worrisome for metastatic disease, likely metastatic colon cancer. 2. The lesion of concern in the anterior suprahilar lip of the left kidney is also enhancing, worrisome for neoplasm. This has a somewhat atypical appearance for renal cell carcinoma and could reflect a metastasis as well. 3. New left-sided hydroureter with delayed contrast excretion consistent with distal ureteral obstruction. Specific etiology uncertain. 4. Complex cystic and solid left adnexal lesion could reflect cystic neoplasm, although does not appear aggressive or appear to reflect the cause of the distal left ureteral obstruction. 5. Sigmoid colon wall thickening could be related to reported newly diagnosed colon cancer.    11/06/2016 PET scan    PET 11/06/2016 IMPRESSION: 1. Hypermetabolic pulmonary and hepatic lesions, most consistent with metastatic colon cancer, with the hypermetabolic primary located in the sigmoid colon. 2. Left renal and left adnexal lesions, better seen and evaluated on 10/15/2016. 3. Persistent moderate left hydronephrosis. 4. Aortic atherosclerosis (ICD10-170.0).    11/11/2016 Pathology Results    Liver, needle/core biopsy, left lobe - METASTATIC ADENOCARCINOMA. The histologic features are consistent with a primary gastrointestinal adenocarcinoma.    05/19/2017 Imaging    CT Abdomen Pelvis, 05/19/2017 IMPRESSION: 1. Worsening pulmonary and hepatic metastatic disease. Primary sigmoid lesion is  stable to minimally enlarged. 2. Stable to slight enlargement  of a left perinephric mass, likely a metastasis. Renal cell carcinoma is not excluded. Associated pelvocaliectasis versus mild hydronephrosis. 3. Borderline left periaortic lymph nodes, slightly enlarged. 4. Cholelithiasis. 5.  Aortic atherosclerosis (ICD10-170.0). 6. Cystic and solid lesion in the left adnexa, as on prior exams, likely ovarian in origin.    06/06/2017 - 01/16/2018 Chemotherapy    IV antibody Vectibix every [redacted] weeks along with oral chemo Xeloda for 1 week on and 1 week off starting 06/06/17. Due to skin toxicities we reduced Xeloda to 2000 mg in the AM and 156m in the PM 1 week on and 1 week off on 10/31/17. She had multiple treatment break due to hospitalization. Restarted treatment on 12/26/17. Chemo break 01/16/18-02/20/18    08/07/2017 - 08/16/2017 Hospital Admission    Admit date: 08/07/17 Admission diagnosis: Bowel Obstruction  Additional comments: She was admitted to the hospital on 08/07/17 after [facility] due to a bowel obstruction.  She had a sigmoid colon stent placement on 08/11/17. Bowel obstruction resolved.  CT scan in hospital showed great partial response to chemotherapy. She will continue chemotherapy given good overall tolerance.      08/07/2017 Imaging    CT AP W Contrast 08/07/17 IMPRESSION: Low colonic obstruction secondary to a rectosigmoid stricture at the site of the patient's prior colonic mass, which is no longer evident on CT. Mild upstream colonic wall thickening raises the possibility of superimposed colitis.  Improving hepatic metastases, as above. Left perirenal metastasis, decreased. Visualized pulmonary metastases at the lung bases are grossly unchanged.  3.5 cm mixed cystic/solid left ovarian mass is mildly decreased.     08/11/2017 Procedure    By Dr. MWatt Climes Flexible Sigmoidoscopy 08/11/17 IMPRESSION:   - Preparation of the colon was fair. - Likely malignant partially obstructing tumor in the  recto-sigmoid colon versus necrotic scarring. Injected with contrast proximal to obstruction to confirm proper position. Prosthesis placed. - Stool in the rectum. - No specimens collected.     10/22/2017 Imaging    CT CAP W Contrast 10/22/17 IMPRESSION: 1. Significant interval improvement and liver metastasis. 2. The left perinephric mass is slightly decreased in size in the interval. 3. No significant interval change in the appearance of multiple pulmonary metastasis. 4. Stable appearance of mediastinal adenopathy. 5. Gallstones 6.  Aortic Atherosclerosis (ICD10-I70.0). 7. Colonic stent remains in place without evidence for bowel obstruction.    12/01/2017 - 12/07/2017 Hospital Admission    Admit date: 12/01/17 Admission diagnosis: Bowel Obstruction and abdominal pain Additional comments: had flexible sigmoidoscopy by Dr. MWatt Climeson 12/04/17. Chemo was held during this time.     12/04/2017 Procedure    Flexible Sigmoidoscopy 12/04/17 IMPRESSION - Preparation of the colon was fair. - Stent in the colon. - Stricture in the distal sigmoid colon. Prosthesis placed. - No specimens collected.    02/18/2018 Imaging    CT CAP W Contrast 02/18/18 IMPRESSION: CT CHEST IMPRESSION 1. Bilateral pulmonary metastasis. Compared to the 12/01/2017 abdominal CT, mixed response to therapy. Compared to the 10/22/2017 chest CT, overall slight progression. 2. Similar thoracic adenopathy. 3.  Aortic Atherosclerosis (ICD10-I70.0). CT ABDOMEN AND PELVIS IMPRESSION 1. Since 12/01/2017, similar hepatic metastatic burden. Left renal/perirenal lesion is similar to minimally decreased in size. 2. Sigmoid colonic stent in place with resolution of colonic obstruction. 3. Similar mild soft tissue fullness within both ovaries.    02/20/2018 -  Chemotherapy    second line Irinotecan  and Victibix every 2 weeks on 02/20/2018     HISTORY OF PRESENTING ILLNESS (10/25/2016):  CHRISHA VOGEL 84 y.o. female is here  because of adenocarcinoma of the colon and a left renal mass. The pt noticed worsening episodes of abdominal cramping and nausea with bowel movements in July 2017. She had an abdominal US performed, which was unremarkable. A colonoscopy was then performed at the patient's request, which found a mass about 20 cm from the anal verge. Biopsy showed moderately differentiated adenocarcinoma. MRI done, with a lesion noticed in the left kidney near the pelvis, a 11 mm hepatic lesion, 4 mm lung nodule, and possible cystic left ovarian mass. Pt saw Dr. Johney Maine for information on abdominal surgery for her colon, which she declined. She also saw Dr. Lorna Dibble, who is considering a nephrectomy, but is planning a sternoscopy to better characterize the kidney lesion. She presents today for continued treatment.    She presents today with her son and daughter-in-law. The symptoms started in late July 2017. She had some vomiting, abdominal pain, and constipation, but not bleeding. Her PCP never found any anemia. Initially she lost her appetite, but now she is taking multiple vitamins and supplements, so her appetite is back. Her bowel has improved, but she still has abdominal pain that comes and goes, lasting minutes. The pain is tolerable with aleve. She did not tolerate Tramadol well; nausea and vomiting. She has some occasional left sides back pain. Denies weight loss, or any other concerns. She is able to do everything she needs to around the house.   She had a partial hysterectomy, and a lumpectomy. She has a history of HTN, atrial fibrillation, high cholesterol. She has some hearing loss and can not see out of her right eye. Not a diabetic, but her son and her mother are. No family history of colon cancer. Her sister had breast cancer in her 57's and her brother had lung cancer in his 11's. Never smoker. Doesn't drink alcohol. 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 She has two sons; one lives an hour away, and the other lives  in [place]. She lives alone.   CURRENT THERAPY:  Second line Irinotecan and Vectibix every 2 weeks started on 02/20/2018  INTERVAL HISTORY:   AMBERLIN UTKE is here for a follow up and cycle 3 second-line treatment. She presents to the clinic today accompanied by her daughter-in-law.  She notes she had complete hair loss.  She notes she is tolerating chemo overall well. Her abdominal pain is controlled and her BM have improved, now regular. She denies obvious diarrhea. She will watch for this. She denies nausea. She has had gained some weight. She notes rhinorrhea occasionally, clear with mild bleeding.  She feels she is able to do what she needs with adequate energy.      MEDICAL HISTORY:  Past Medical History:  Diagnosis Date  . Aneurysm of ascending aorta ([facility]) 12/03/2016   3.7 cm by echo 10/2016  . Colon cancer [facility])    dx via biospy from colonoscopy-- malignant ---  currently having work-up done w/ dr gross (general surgeon) and coordinate surgery w/ urologsit for renal tumor  . Essential hypertension 10/31/2016  . GERD (gastroesophageal reflux disease)   . Glaucoma   . Hiatal hernia   . HOH (hard of hearing)    bilateral even w/ hearing aids  . Hyperlipidemia   . Kidney tumor    left side  . Legally blind in right eye, as defined in  [place]    due to macular degeneration  . Macular degeneration   . Nausea    intermittant due to colon mass  . Ovarian cyst   . Persistent atrial fibrillation ([place]) 10/31/2016   on ASA only  . PONV (postoperative nausea and vomiting)   . Wears glasses   . Wears hearing aid    bilateral    SURGICAL HISTORY: Past Surgical History:  Procedure Laterality Date  . BREAST CYST EXCISION  1990   benign  . COLONIC STENT PLACEMENT N/A 12/04/2017   Procedure: COLONIC STENT PLACEMENT;  Surgeon: Clarene Essex, MD;  Location: [facility];  Service: Endoscopy;  Laterality: N/A;  . COLONOSCOPY  09/04/2016  . CYSTOSCOPY WITH RETROGRADE PYELOGRAM,  URETEROSCOPY AND STENT PLACEMENT Left 10/10/2016   Procedure: CYSTOSCOPY WITH RETROGRADE PYELOGRAM, DIAGNOSTIC URETEROSCOPY  AND STENT PLACEMENT;  Surgeon: Alexis Frock, MD;  Location: [facility];  Service: Urology;  Laterality: Left;  . ESOPHAGOGASTRODUODENOSCOPY  11/07/2014  . FLEXIBLE SIGMOIDOSCOPY N/A 08/11/2017   Procedure: FLEXIBLE SIGMOIDOSCOPY;  Surgeon: Clarene Essex, MD;  Location: [facility];  Service: Endoscopy;  Laterality: N/A;  With Fluoroscopy for colonic stent placement for colonic stricture  . FLEXIBLE SIGMOIDOSCOPY N/A 12/04/2017   Procedure: FLEXIBLE SIGMOIDOSCOPY;  Surgeon: Clarene Essex, MD;  Location: [facility];  Service: Endoscopy;  Laterality: N/A;  . NASAL SINUS SURGERY    . TONSILLECTOMY      SOCIAL HISTORY: Social History   Socioeconomic History  . Marital status: Widowed    Spouse name: Not on file  . Number of children: Not on file  . Years of education: Not on file  . Highest education level: Not on file  Occupational History  . Not on file  Social Needs  . Financial resource strain: Not on file  . Food insecurity:    Worry: Not on file    Inability: Not on file  . Transportation needs:    Medical: Not on file    Non-medical: Not on file  Tobacco Use  . Smoking status: Never Smoker  . Smokeless tobacco: Never Used  Substance and Sexual Activity  . Alcohol use: No  . Drug use: No  . Sexual activity: Not on file  Lifestyle  . Physical activity:    Days per week: Not on file    Minutes per session: Not on file  . Stress: Not on file  Relationships  . Social connections:    Talks on phone: Not on file    Gets together: Not on file    Attends religious service: Not on file    Active member of club or organization: Not on file    Attends meetings of clubs or organizations: Not on file    Relationship status: Not on file  . Intimate partner violence:    Fear of current or ex partner: Not on file    Emotionally abused: Not on  file    Physically abused: Not on file    Forced sexual activity: Not on file  Other Topics Concern  . Not on file  Social History Narrative  . Not on file    FAMILY HISTORY: Family History  Problem Relation Age of Onset  . Cancer Sister 32       breast cancer   . Cancer Brother 32       lung cancer   . Diabetes Son   . High blood pressure Son   . Peripheral Artery Disease Mother   . High blood pressure  Mother     ALLERGIES:  is allergic to benazepril; tylenol [acetaminophen]; and bactrim [sulfamethoxazole-trimethoprim].  MEDICATIONS:  Current Outpatient Medications  Medication Sig Dispense Refill  . amLODipine (NORVASC) 5 MG tablet Take 1 tablet (5 mg total) by mouth daily. 90 tablet 1  . Bisacodyl (DULCOLAX PO) Take 2 tablets by mouth 2 (two) times daily.    . bisacodyl (DULCOLAX) 10 MG suppository Place 10 mg rectally as needed for moderate constipation.    . capecitabine (XELODA) 500 MG tablet Take 4 tablets ( 2000 mg ) in  AM ;  3 tablets ( 1500 mg ) in  PM for  7 days on ,  Off 7 days. 98 tablet 2  . diltiazem (CARDIZEM CD) 240 MG 24 hr capsule Take 1 capsule (240 mg total) by mouth daily. 90 capsule 1  . diltiazem (CARDIZEM CD) 240 MG 24 hr capsule TAKE 1 CAPSULE BY MOUTH EVERY DAY 90 capsule 2  . escitalopram (LEXAPRO) 10 MG tablet Take 10 mg by mouth daily.    . fexofenadine (ALLEGRA) 180 MG tablet Take 180 mg by mouth daily.    Marland Kitchen latanoprost (XALATAN) 0.005 % ophthalmic solution Place 1 drop into both eyes at bedtime.    Marland Kitchen losartan (COZAAR) 100 MG tablet Take 1 tablet (100 mg total) by mouth daily. 90 tablet 0  . Multiple Vitamins-Minerals (VITEYES AREDS ADVANCED) CAPS Take 1 capsule by mouth daily.    Marland Kitchen omeprazole (PRILOSEC) 40 MG capsule Take 40 mg by mouth daily.    . ondansetron (ZOFRAN ODT) 4 MG disintegrating tablet Take 1 tablet (4 mg total) by mouth every 8 (eight) hours as needed for nausea or vomiting. 30 tablet 0  . oxyCODONE (OXY IR/ROXICODONE) 5 MG  immediate release tablet Take 1 tablet (5 mg total) by mouth every 6 (six) hours as needed for moderate pain or severe pain. 90 tablet 0  . Polyethyl Glycol-Propyl Glycol (SYSTANE) 0.4-0.3 % SOLN Apply 1 drop to eye daily as needed (dry eyes).     . polyethylene glycol (MIRALAX / GLYCOLAX) packet Take 17 g by mouth daily as needed for moderate constipation. 30 each 0  . Potassium Chloride ER 20 MEQ TBCR Take 20 mEq by mouth daily. 30 tablet 0  . prochlorperazine (COMPAZINE) 10 MG tablet Take 1 tablet (10 mg total) by mouth every 6 (six) hours as needed for nausea or vomiting. 30 tablet 0  . sennosides-docusate sodium (SENOKOT-S) 8.6-50 MG tablet Take 1-2 tablets by mouth 3 (three) times daily.    . urea (CARMOL) 10 % cream Apply topically as needed. 71 g 0   Current Facility-Administered Medications  Medication Dose Route Frequency Provider Last Rate Last Dose  . sodium chloride flush (NS) 0.9 % injection 3 mL  3 mL Intravenous PRN Skeet Latch, MD       Facility-Administered Medications Ordered in Other Visits  Medication Dose Route Frequency Provider Last Rate Last Dose  . atropine injection 0.5 mg  0.5 mg Intravenous Once PRN Truitt Merle, MD      . irinotecan (CAMPTOSAR) 260 mg in dextrose 5 % 500 mL chemo infusion  140 mg/m2 (Treatment Plan Recorded) Intravenous Once Truitt Merle, MD      . panitumumab (VECTIBIX) 500 mg in sodium chloride 0.9 % 100 mL chemo infusion  500 mg Intravenous Once Truitt Merle, MD        REVIEW OF SYSTEMS:   Constitutional: Denies fevers, chills or abnormal (+) complete hair loss (+) weight gain Eyes:  Denies blurriness of vision, double vision or watery eyes Ears, nose, mouth, throat, and face: Denies mucositis or sore throat (+) occasional rhinorrhea with mild bleeding Respiratory: Denies cough, dyspnea or wheezes Cardiovascular: Denies palpitation, chest discomfort  Gastrointestinal:  Denies heartburn (+) improved looser stool  Skin: Denies rashes  Lymphatics:  Denies new lymphadenopathy or easy bruising MSK: negative Neurological:Denies numbness, tingling or new weaknesses Behavioral/Psych: Mood is stable, no new changes  All other systems were reviewed with the patient and are negative.  PHYSICAL EXAMINATION:  ECOG PERFORMANCE STATUS: 1 - Symptomatic but completely ambulatory Vitals:   03/23/18 1300  BP: 140/63  Pulse: 76  Resp: 18  Temp: (!) 97.4 F (36.3 C)  TempSrc: Oral  SpO2: 99%  Weight: 174 lb 1.6 oz (79 kg)  Height: '5\' 6"'$  (1.676 m)     GENERAL:alert, no distress and comfortable SKIN: skin color, texture, turgor are normal  EYES: normal, conjunctiva are pink and non-injected, sclera clear OROPHARYNX:no exudate, no erythema and lips, buccal mucosa, and tongue normal  NECK: supple, thyroid normal size, non-tender, without nodularity LYMPH:  no palpable lymphadenopathy in the cervical, axillary or inguinal LUNGS: clear to auscultation and percussion with normal breathing effort HEART: regular rate & rhythm and no murmurs and   ABDOMEN:abdomen soft, mild tenderness at LLQ and right side of abdomen,  (+) active bowel sounds Musculoskeletal:no cyanosis of digits and no clubbing  PSYCH: alert & oriented x 3 with fluent speech NEURO: no focal motor/sensory deficits   LABORATORY DATA:  I have reviewed the data as listed CBC Latest Ref Rng & Units 03/23/2018 03/09/2018 02/18/2018  WBC 3.9 - 10.3 K/uL 8.8 7.5 9.5  Hemoglobin 11.6 - 15.9 g/dL 11.4(L) 11.3(L) 12.5  Hematocrit 34.8 - 46.6 % 36.0 35.4 38.0  Platelets 145 - 400 K/uL 350 347 393   CMP Latest Ref Rng & Units 03/23/2018 03/09/2018 02/18/2018  Glucose 70 - 99 mg/dL 86 91 91  BUN 8 - 23 mg/dL '10 13 15  '$ Creatinine 0.44 - 1.00 mg/dL 0.84 0.84 1.08(H)  Sodium 135 - 145 mmol/L 138 139 139  Potassium 3.5 - 5.1 mmol/L 3.9 3.7 4.5  Chloride 98 - 111 mmol/L 104 105 104  CO2 22 - 32 mmol/L '24 27 25  '$ Calcium 8.9 - 10.3 mg/dL 9.3 9.5 9.7  Total Protein 6.5 - 8.1 g/dL 7.8 7.4 8.7(H)    Total Bilirubin 0.3 - 1.2 mg/dL 0.5 0.3 0.5  Alkaline Phos 38 - 126 U/L 115 86 101  AST 15 - 41 U/L 16 14(L) 15  ALT 0 - 44 U/L '26 13 15   '$ PATHOLOGY: [facility] 11/11/16   Diagnosis 11/11/16 Liver, needle/core biopsy, left lobe - METASTATIC ADENOCARCINOMA. - SEE COMMENT. Microscopic Comment The histologic features are consistent with a primary gastrointestinal adenocarcinoma. Dr. Vicente Males has reviewed the case and concurs with this interpretation. There is likely sufficient tumor present for additional studies, if requested. Enid Cutter MD Pathologist, Electronic Signature (Case signed 11/12/2016)  Diagnosis 09/04/2016 MODERATELY DIFFERENTIATED ADENOCARCINOMA OF COLON AT 20 CM Tumor Site: Colon at 20 cm Tumor Type: Adenocarcinoma Tumore Size: Unknown  Grade: Moderately differentiated   RADIOGRAPHIC STUDIES: I have personally reviewed the radiological images as listed and agreed with the findings in the report.   CT CAP W Contrast 02/18/18 IMPRESSION: CT CHEST IMPRESSION 1. Bilateral pulmonary metastasis. Compared to the 12/01/2017 abdominal CT, mixed response to therapy. Compared to the 10/22/2017 chest CT, overall slight progression. 2. Similar thoracic adenopathy. 3.  Aortic  Atherosclerosis (ICD10-I70.0). CT ABDOMEN AND PELVIS IMPRESSION 1. Since 12/01/2017, similar hepatic metastatic burden. Left renal/perirenal lesion is similar to minimally decreased in size. 2. Sigmoid colonic stent in place with resolution of colonic obstruction. 3. Similar mild soft tissue fullness within both ovaries.   DG Abdomen 11/28/17 IMPRESSION: Stent within the sigmoid colon. Gaseous distention of bowel above the stent concerning for a degree of bowel obstruction.  CT CAP W Contrast 10/22/17 IMPRESSION: 1. Significant interval improvement and liver metastasis. 2. The left perinephric mass is slightly decreased in size in the interval. 3. No significant interval change in the  appearance of multiple pulmonary metastasis. 4. Stable appearance of mediastinal adenopathy. 5. Gallstones 6.  Aortic Atherosclerosis (ICD10-I70.0). 7. Colonic stent remains in place without evidence for bowel obstruction.  CT AP W Contrast 08/07/17 IMPRESSION: Low colonic obstruction secondary to a rectosigmoid stricture at the site of the patient's prior colonic mass, which is no longer evident on CT. Mild upstream colonic wall thickening raises the possibility of superimposed colitis.Improving hepatic metastases, as above. Left perirenal metastasis, decreased. Visualized pulmonary metastases at the lung bases are grossly unchanged. 3.5 cm mixed cystic/solid left ovarian mass is mildly decreased.  CT Chest 06/06/17  IMPRESSION: 1. There are multiple new and enlarging pulmonary nodules throughout the lungs bilaterally. Additionally, a few of the nodules have decreased in size when compared to prior. 2. Interval increase in size of precarinal lymph node. 3. Interval increase in size of hepatic lesions. 4. Aortic Atherosclerosis (ICD10-I70.0).  CT Abdomen Pelvis, 05/19/2017 IMPRESSION: 1. Worsening pulmonary and hepatic metastatic disease. Primary sigmoid lesion is stable to minimally enlarged. 2. Stable to slight enlargement of a left perinephric mass, likely a metastasis. Renal cell carcinoma is not excluded. Associated pelvocaliectasis versus mild hydronephrosis. 3. Borderline left periaortic lymph nodes, slightly enlarged. 4. Cholelithiasis. 5.  Aortic atherosclerosis (ICD10-170.0). 6. Cystic and solid lesion in the left adnexa, as on prior exams, likely ovarian in origin.  PET 11/06/2016 IMPRESSION: 1. Hypermetabolic pulmonary and hepatic lesions, most consistent with metastatic colon cancer, with the hypermetabolic primary located in the sigmoid colon. 2. Left renal and left adnexal lesions, better seen and evaluated on 10/15/2016. 3. Persistent moderate left  hydronephrosis. 4. Aortic atherosclerosis (ICD10-170.0).  MRI Abdomen Pelvis w/wo Contrast 10/15/2016 IMPRESSION: 1. There are 2 enhancing hepatic lesions highly worrisome for metastatic disease, likely metastatic colon cancer. 2. The lesion of concern in the anterior suprahilar lip of the left kidney is also enhancing, worrisome for neoplasm. This has a somewhat atypical appearance for renal cell carcinoma and could reflect a metastasis as well. 3. New left-sided hydroureter with delayed contrast excretion consistent with distal ureteral obstruction. Specific etiology uncertain. 4. Complex cystic and solid left adnexal lesion could reflect cystic neoplasm, although does not appear aggressive or appear to reflect the cause of the distal left ureteral obstruction. 5. Sigmoid colon wall thickening could be related to reported newly diagnosed colon cancer.  CT CAP w Contrast 09/06/2016 (done outside) IMPRESSION: 1. No acute abdominopelvic process.  2. Heterogenous hypodense area involving the medial edge of the left kidney with multiple small soft tissue like stranding involving the left pararenal space. Findings are suspicious for malignancy of the left kidney with small pararenal metastases. MRI abdomen with contrast should be considered to further evaluate.  3. Vague hypodensity involving the lateral segment of the liver. This can also be reevaluated with MRI.  4. Partially visualized 5 mm posterior right lower lobe nodular density. CT chest without contrast follow up  is recommended.  5. Complex multilobular left ovarian lesion may reflect a cluster of cysts.   PROCEDURES  Flexible Sigmoidoscopy 12/04/17 IMPRESSION - Preparation of the colon was fair. - Stent in the colon. - Stricture in the distal sigmoid colon. Prosthesis placed. - No specimens collected.   Flexible Sigmoidoscopy 08/11/17 IMPRESSION:   - Preparation of the colon was fair. - Likely malignant partially obstructing  tumor in the recto-sigmoid colon versus necrotic scarring. Injected with contrast proximal to obstruction to confirm proper position. Prosthesis placed. - Stool in the rectum. - No specimens collected.   Echocardiogram 11/03/16 - Left ventricle: The cavity size was normal. Wall thickness was   increased in a pattern of mild LVH. Indeterminate diastolic   function (atrial fibrillation). Systolic function was normal. The   estimated ejection fraction was in the range of 60% to 65%.   Operative Report by Dr. Tresa Moore on 10/10/16 Procedure: 1) Cystoscopy, left retrograde pyelogram interpretation. 2) Left diagnostic uteroscopy. 3) Insertion of left uteral stent; 5 x 24 Polaris with tether. Findings: 1) Unremarkable bladder. 2) Unremarkable left retrograde pyelogram. 3) No evidence of intraluminal urothelial neoplasm whatsoever with inspection of the left kidney and ureter times several. 4) Successful placement of left ureteral stent, proximal in renal pelvis and distal in urinary bladder.  Colonoscopy 09/02/2016 FINDINGS: Diverticulosis (scattered). Pt has about 10 cm structure starting at 20 cm with abnormal mucosa. The colonoscopy scope was not able to advance due to structure, and upper endoscopy scope was used and it was advanced through the stricture to see ileocecal valve. Biopsies at the stricture were taken.   ASSESSMENT & PLAN:  Mary [name] is a 84 y.o. female   1. Adenocarcinoma of left colon, sigmoid colon, TxNxM1 with liver and lung mets, stage IV, KRAS/NRS wild type, MSI-stable  -We previously discussed her incurable nature of metastatic colon cancer, and the atypical of therapy. -She has been on first-line chemotherapy with chemotherapy and Panitumumab, tolerating very well overall, and has had partial response. -However she developed recurrent bowel obstruction secondary to the sigmoid colon mass, status post colonic stent placement twice by Dr. Watt Climes. -I previously reviewed her CT  scan from 12/01/17 which showed her bowel obstruction as well as good response to treatment in her liver and lung mets.  -I previously discussed she may have recurrent bowel obstruction in the future, and he can follow-up with Dr. Watt Climes closely. Will watch her symptoms, especially constipation and abdominal pain closely. We also discussed the surgical option of diverting colostomy, she previously saw Dr. Johney Maine, she would like to hold on surgery as long as she can.  -I recommended she continue with her systemic therapy given she is responding well. Pt agreed to continue treatment.  She restarted on 12/26/17. She took a chemo break after 01/16/18 then changed treatment.  -We discussed her CT CAP from 02/18/18 which showed stent adequately placed and no bowel obstruction. B/l lung mets have slight progression and her liver mets has slight decrease, overall stable disease.  -She now had mild disease progression in the lungs, and has had a recurrent biopsy option it is a primary sigmoid colon site, she has been on low-dose chemo overall, which I feel is not adequately controlled disease. I previously discussed the option of changing treatment to intravenous chemo, such as in undertaken, continue current Xeloda and Vectibix, versus palliative care and hospice. She overall is elderly, does not want have side effects from chemo, is reluctant to take intensive chemotherapy such as FOLFOX  or FOLFIRI.  A lengthy discussion, she agreed to change Xeloda to irinotecan, every 2 weeks, I will continue Vectibix given her overall stable disease. Not a candidate for Avastin due to the recurrent bowel obstruction. Side effects were discussed with patient in great detail. She agreed to proceed to start on 02/20/18. -I reduced irinotecan to 140 mg/m for first cycle. She tolerated first cycle moderately well with nausea, fatigue and mild diarrhea. She recovered well within a week, weight is stable. She would like to continue with  treatment.  I will continue her current dose at '140mg'$ /m2. I will reduce dose as needed and stop if she is not longer able to tolerate.  -She continues to tolerate irinotecan well with complete hair loss, nail change and occasional rhinorrhea, GI issues overall resolved currently. I suggest biotin vitamin, she is interested.  -Labs revewied, mild anemia but adequate to proceed with treatment today. I suggest she take multivitamin with iron.  -Next scan in 04/2018 after cycle 5.   2. Constipation, abdominal pain  -I previously reviewed laxative use. She will take a daily dose of Miralax up to 3-4 times a day and if she does not have a BM for 3-4 days she will also use Senokot-S. -Pt notes Magnesium citrate caused significant bloating and she does not want to take it again.  -She is currently on oxycodone 3-4 times a day  -Due to recurrent bowel obstructions, on 12/04/17 Dr. Watt Climes did a stent placement.  -I previously recommend she increase her Senakot-S to 2-3 tabs BID and continue Miralax 30 minutes postprandial.  -Her BM have become more loose (mild diarrhea) and stable due to chemo and her pain is resolved currently and not taking pain medication.  -She remains stable.    3. Left renal mass and left hydronephrosis -Possible renal cell carcinoma, metastatic lesion from colon cancer is also a possibility. -She has been seen by [facility], Dr. Tresa Moore, and had left ureter stent placement on 10/10/2016.  -no signs of hydronephrosis on 02/18/18 CT scan, and a left renal mass is likely decreased but overall similar.   4. Complex cyst of left ovary -will monitor. Likely benign -If she undergoes abdominal surgery, may remove it -She declines surgery for now -08/07/17 CT shows mild decrease in size of cyst -02/18/18 CT shows overall stable cyst   5. Atrial Fibrillation -The patient has a history of atrial fibrillation. -Her cardiologist wants her to undergo cardio inversion and anticoagulation. -I previously  advised the patient that even if she undergoes cardio inversion, her Afib might return. Also, her anticoagulation medication might make her bleeding worse. -The patient should continue taking baby aspirin.  6. HTN -I previously advised the patient to continue medications and follow up with primary care physician -BP normal in clinic lately   7. Nutrition -We previously discussed palliative chemotherapy will make her fatigued, nauseous, and loss of appetite. -I previously stressed the importance of a healthy diet with plenty of protein. -I previously referred to Nutrition.  -She continues to gain weight lately   8.Goal of care discussion  -We previously discussed the incurable nature of her cancer, and the overall poor prognosis, especially if she does not have good response to chemotherapy or progress on chemo -The patient understands the goal of care is palliative. -I previously recommended DNR/DNI, she will think about it   9. Partial bowel obstruction, s/p sigmoid stent placement on 08/11/17 and again on 12/04/17 -Hospitalized on 08/07/17, and had stent placed on 08/11/17.  -  She now had recurrent bowel obstruction as of 11/28/17. She was given lactulose and put on a liquid diet on 11/28/17.  -Without much relief she was admitted to the hospital on 12/01/17 for her bowel obstruction and abdominal pain.  -She had a flexible sigmoidoscopy by Dr. Watt Climes on 12/04/17 and had stent placed.  -If endoscopy procedure is not able to resolve the obstruction, then surgery with diverting colostomy may be the only option. She is open to that.  -She will see Dr. Watt Climes in 4 months to monitor her stents, or sooner if needed -Her pain resolved and stool softened due to current chemotherapy. I previously encouraged her to watch for signs of bowel obstruction.    Plan:  -Labs reviewed and adequate to proceed cycle 3 irinotecan/Vectibix, at same dose as last cycle  -Lab, f/u and irinotecan/Vectibix in 2 weeks    -CT Scan after cycle 5 and I will see her on 9/24 with treatment. She wants to scheudle those appointments on next visit    All questions were answered. The patient knows to call the clinic with any problems, questions or concerns. I spent a total of 25 minutes for her visit, more than 50% face-to-face counseling.  Oneal Deputy, am acting as scribe for Truitt Merle, MD.   I have reviewed the above documentation for accuracy and completeness, and I agree with the above.         Truitt Merle, MD 03/23/2018

## 2018-03-23 ENCOUNTER — Encounter: Payer: Self-pay | Admitting: Hematology

## 2018-03-23 ENCOUNTER — Inpatient Hospital Stay: Payer: Medicare Other

## 2018-03-23 ENCOUNTER — Inpatient Hospital Stay (HOSPITAL_BASED_OUTPATIENT_CLINIC_OR_DEPARTMENT_OTHER): Payer: Medicare Other | Admitting: Hematology

## 2018-03-23 ENCOUNTER — Inpatient Hospital Stay: Payer: Medicare Other | Attending: Hematology

## 2018-03-23 VITALS — BP 140/63 | HR 76 | Temp 97.4°F | Resp 18 | Ht 66.0 in | Wt 174.1 lb

## 2018-03-23 DIAGNOSIS — G893 Neoplasm related pain (acute) (chronic): Secondary | ICD-10-CM | POA: Insufficient documentation

## 2018-03-23 DIAGNOSIS — Z5111 Encounter for antineoplastic chemotherapy: Secondary | ICD-10-CM | POA: Diagnosis not present

## 2018-03-23 DIAGNOSIS — L659 Nonscarring hair loss, unspecified: Secondary | ICD-10-CM | POA: Insufficient documentation

## 2018-03-23 DIAGNOSIS — Z5112 Encounter for antineoplastic immunotherapy: Secondary | ICD-10-CM | POA: Insufficient documentation

## 2018-03-23 DIAGNOSIS — C187 Malignant neoplasm of sigmoid colon: Secondary | ICD-10-CM | POA: Insufficient documentation

## 2018-03-23 DIAGNOSIS — K521 Toxic gastroenteritis and colitis: Secondary | ICD-10-CM | POA: Diagnosis not present

## 2018-03-23 DIAGNOSIS — R5383 Other fatigue: Secondary | ICD-10-CM | POA: Diagnosis not present

## 2018-03-23 DIAGNOSIS — D6481 Anemia due to antineoplastic chemotherapy: Secondary | ICD-10-CM | POA: Insufficient documentation

## 2018-03-23 DIAGNOSIS — I4891 Unspecified atrial fibrillation: Secondary | ICD-10-CM | POA: Diagnosis not present

## 2018-03-23 DIAGNOSIS — C7801 Secondary malignant neoplasm of right lung: Secondary | ICD-10-CM | POA: Diagnosis not present

## 2018-03-23 DIAGNOSIS — I10 Essential (primary) hypertension: Secondary | ICD-10-CM | POA: Insufficient documentation

## 2018-03-23 DIAGNOSIS — C7802 Secondary malignant neoplasm of left lung: Secondary | ICD-10-CM | POA: Insufficient documentation

## 2018-03-23 DIAGNOSIS — Z7189 Other specified counseling: Secondary | ICD-10-CM

## 2018-03-23 DIAGNOSIS — N2889 Other specified disorders of kidney and ureter: Secondary | ICD-10-CM | POA: Insufficient documentation

## 2018-03-23 DIAGNOSIS — C787 Secondary malignant neoplasm of liver and intrahepatic bile duct: Secondary | ICD-10-CM | POA: Diagnosis not present

## 2018-03-23 DIAGNOSIS — N83292 Other ovarian cyst, left side: Secondary | ICD-10-CM | POA: Diagnosis not present

## 2018-03-23 DIAGNOSIS — C186 Malignant neoplasm of descending colon: Secondary | ICD-10-CM

## 2018-03-23 LAB — CMP (CANCER CENTER ONLY)
ALBUMIN: 3.6 g/dL (ref 3.5–5.0)
ALT: 26 U/L (ref 0–44)
AST: 16 U/L (ref 15–41)
Alkaline Phosphatase: 115 U/L (ref 38–126)
Anion gap: 10 (ref 5–15)
BILIRUBIN TOTAL: 0.5 mg/dL (ref 0.3–1.2)
BUN: 10 mg/dL (ref 8–23)
CO2: 24 mmol/L (ref 22–32)
Calcium: 9.3 mg/dL (ref 8.9–10.3)
Chloride: 104 mmol/L (ref 98–111)
Creatinine: 0.84 mg/dL (ref 0.44–1.00)
Glucose, Bld: 86 mg/dL (ref 70–99)
POTASSIUM: 3.9 mmol/L (ref 3.5–5.1)
Sodium: 138 mmol/L (ref 135–145)
TOTAL PROTEIN: 7.8 g/dL (ref 6.5–8.1)

## 2018-03-23 LAB — MAGNESIUM: Magnesium: 1.8 mg/dL (ref 1.7–2.4)

## 2018-03-23 LAB — CBC WITH DIFFERENTIAL (CANCER CENTER ONLY)
BASOS ABS: 0.1 10*3/uL (ref 0.0–0.1)
Basophils Relative: 1 %
Eosinophils Absolute: 0.3 10*3/uL (ref 0.0–0.5)
Eosinophils Relative: 4 %
HEMATOCRIT: 36 % (ref 34.8–46.6)
Hemoglobin: 11.4 g/dL — ABNORMAL LOW (ref 11.6–15.9)
LYMPHS PCT: 23 %
Lymphs Abs: 2 10*3/uL (ref 0.9–3.3)
MCH: 29.1 pg (ref 25.1–34.0)
MCHC: 31.7 g/dL (ref 31.5–36.0)
MCV: 91.8 fL (ref 79.5–101.0)
Monocytes Absolute: 1.2 10*3/uL — ABNORMAL HIGH (ref 0.1–0.9)
Monocytes Relative: 13 %
NEUTROS ABS: 5.3 10*3/uL (ref 1.5–6.5)
NEUTROS PCT: 59 %
Platelet Count: 350 10*3/uL (ref 145–400)
RBC: 3.92 MIL/uL (ref 3.70–5.45)
RDW: 15.1 % — ABNORMAL HIGH (ref 11.2–14.5)
WBC: 8.8 10*3/uL (ref 3.9–10.3)

## 2018-03-23 LAB — CEA (IN HOUSE-CHCC): CEA (CHCC-In House): 4.26 ng/mL (ref 0.00–5.00)

## 2018-03-23 MED ORDER — SODIUM CHLORIDE 0.9 % IV SOLN
500.0000 mg | Freq: Once | INTRAVENOUS | Status: AC
  Administered 2018-03-23: 500 mg via INTRAVENOUS
  Filled 2018-03-23: qty 20

## 2018-03-23 MED ORDER — ATROPINE SULFATE 1 MG/ML IJ SOLN
0.5000 mg | Freq: Once | INTRAMUSCULAR | Status: AC | PRN
  Administered 2018-03-23: 0.5 mg via INTRAVENOUS

## 2018-03-23 MED ORDER — PALONOSETRON HCL INJECTION 0.25 MG/5ML
0.2500 mg | Freq: Once | INTRAVENOUS | Status: AC
  Administered 2018-03-23: 0.25 mg via INTRAVENOUS

## 2018-03-23 MED ORDER — DEXAMETHASONE SODIUM PHOSPHATE 10 MG/ML IJ SOLN
10.0000 mg | Freq: Once | INTRAMUSCULAR | Status: AC
  Administered 2018-03-23: 10 mg via INTRAVENOUS

## 2018-03-23 MED ORDER — SODIUM CHLORIDE 0.9 % IV SOLN
Freq: Once | INTRAVENOUS | Status: AC
  Administered 2018-03-23: 14:00:00 via INTRAVENOUS
  Filled 2018-03-23: qty 250

## 2018-03-23 MED ORDER — ATROPINE SULFATE 1 MG/ML IJ SOLN
INTRAMUSCULAR | Status: AC
  Filled 2018-03-23: qty 1

## 2018-03-23 MED ORDER — PALONOSETRON HCL INJECTION 0.25 MG/5ML
INTRAVENOUS | Status: AC
  Filled 2018-03-23: qty 5

## 2018-03-23 MED ORDER — DEXAMETHASONE SODIUM PHOSPHATE 10 MG/ML IJ SOLN
INTRAMUSCULAR | Status: AC
  Filled 2018-03-23: qty 1

## 2018-03-23 MED ORDER — IRINOTECAN HCL CHEMO INJECTION 100 MG/5ML
140.0000 mg/m2 | Freq: Once | INTRAVENOUS | Status: AC
  Administered 2018-03-23: 260 mg via INTRAVENOUS
  Filled 2018-03-23: qty 13

## 2018-04-03 ENCOUNTER — Telehealth: Payer: Self-pay | Admitting: *Deleted

## 2018-04-03 ENCOUNTER — Telehealth: Payer: Self-pay | Admitting: Hematology

## 2018-04-03 NOTE — Telephone Encounter (Signed)
PT scheduled per 8/23 sch message.  °

## 2018-04-03 NOTE — Telephone Encounter (Signed)
Spoke with daughter-in-law regarding 6 month follow up. She will discuss with patient and see how she feels about coming, patient undergoing chemo. Will call back to reschedule

## 2018-04-06 ENCOUNTER — Inpatient Hospital Stay (HOSPITAL_BASED_OUTPATIENT_CLINIC_OR_DEPARTMENT_OTHER): Payer: Medicare Other | Admitting: Hematology

## 2018-04-06 ENCOUNTER — Inpatient Hospital Stay: Payer: Medicare Other

## 2018-04-06 ENCOUNTER — Ambulatory Visit: Payer: Medicare Other | Admitting: Nurse Practitioner

## 2018-04-06 ENCOUNTER — Other Ambulatory Visit: Payer: Medicare Other

## 2018-04-06 ENCOUNTER — Ambulatory Visit: Payer: Medicare Other

## 2018-04-06 ENCOUNTER — Encounter: Payer: Self-pay | Admitting: Hematology

## 2018-04-06 VITALS — BP 128/91 | HR 66 | Temp 98.0°F | Resp 16

## 2018-04-06 DIAGNOSIS — I4891 Unspecified atrial fibrillation: Secondary | ICD-10-CM

## 2018-04-06 DIAGNOSIS — C7802 Secondary malignant neoplasm of left lung: Secondary | ICD-10-CM | POA: Diagnosis not present

## 2018-04-06 DIAGNOSIS — C187 Malignant neoplasm of sigmoid colon: Secondary | ICD-10-CM | POA: Diagnosis not present

## 2018-04-06 DIAGNOSIS — Z5111 Encounter for antineoplastic chemotherapy: Secondary | ICD-10-CM | POA: Diagnosis not present

## 2018-04-06 DIAGNOSIS — R5383 Other fatigue: Secondary | ICD-10-CM

## 2018-04-06 DIAGNOSIS — C787 Secondary malignant neoplasm of liver and intrahepatic bile duct: Secondary | ICD-10-CM

## 2018-04-06 DIAGNOSIS — C7801 Secondary malignant neoplasm of right lung: Secondary | ICD-10-CM | POA: Diagnosis not present

## 2018-04-06 DIAGNOSIS — Z7982 Long term (current) use of aspirin: Secondary | ICD-10-CM

## 2018-04-06 DIAGNOSIS — I10 Essential (primary) hypertension: Secondary | ICD-10-CM | POA: Diagnosis not present

## 2018-04-06 DIAGNOSIS — K521 Toxic gastroenteritis and colitis: Secondary | ICD-10-CM

## 2018-04-06 DIAGNOSIS — Z7189 Other specified counseling: Secondary | ICD-10-CM | POA: Diagnosis not present

## 2018-04-06 DIAGNOSIS — Z7901 Long term (current) use of anticoagulants: Secondary | ICD-10-CM | POA: Diagnosis not present

## 2018-04-06 DIAGNOSIS — Z5112 Encounter for antineoplastic immunotherapy: Secondary | ICD-10-CM | POA: Diagnosis not present

## 2018-04-06 DIAGNOSIS — C186 Malignant neoplasm of descending colon: Secondary | ICD-10-CM

## 2018-04-06 LAB — CBC WITH DIFFERENTIAL (CANCER CENTER ONLY)
BASOS ABS: 0.1 10*3/uL (ref 0.0–0.1)
BASOS PCT: 1 %
EOS ABS: 0.5 10*3/uL (ref 0.0–0.5)
EOS PCT: 7 %
HCT: 34.6 % — ABNORMAL LOW (ref 34.8–46.6)
Hemoglobin: 11.3 g/dL — ABNORMAL LOW (ref 11.6–15.9)
Lymphocytes Relative: 18 %
Lymphs Abs: 1.4 10*3/uL (ref 0.9–3.3)
MCH: 29 pg (ref 25.1–34.0)
MCHC: 32.6 g/dL (ref 31.5–36.0)
MCV: 89 fL (ref 79.5–101.0)
Monocytes Absolute: 1.2 10*3/uL — ABNORMAL HIGH (ref 0.1–0.9)
Monocytes Relative: 17 %
NEUTROS PCT: 57 %
Neutro Abs: 4.2 10*3/uL (ref 1.5–6.5)
PLATELETS: 399 10*3/uL (ref 145–400)
RBC: 3.89 MIL/uL (ref 3.70–5.45)
RDW: 16.5 % — ABNORMAL HIGH (ref 11.2–14.5)
WBC: 7.4 10*3/uL (ref 3.9–10.3)

## 2018-04-06 LAB — CMP (CANCER CENTER ONLY)
ALT: 15 U/L (ref 0–44)
AST: 13 U/L — AB (ref 15–41)
Albumin: 3.2 g/dL — ABNORMAL LOW (ref 3.5–5.0)
Alkaline Phosphatase: 95 U/L (ref 38–126)
Anion gap: 9 (ref 5–15)
BILIRUBIN TOTAL: 0.3 mg/dL (ref 0.3–1.2)
BUN: 14 mg/dL (ref 8–23)
CO2: 25 mmol/L (ref 22–32)
CREATININE: 0.94 mg/dL (ref 0.44–1.00)
Calcium: 9.3 mg/dL (ref 8.9–10.3)
Chloride: 106 mmol/L (ref 98–111)
GFR, EST NON AFRICAN AMERICAN: 55 mL/min — AB (ref >60)
GFR, Est AFR Am: >60 mL/min (ref >60)
Glucose, Bld: 107 mg/dL — ABNORMAL HIGH (ref 70–99)
POTASSIUM: 3.9 mmol/L (ref 3.5–5.1)
Sodium: 140 mmol/L (ref 135–145)
TOTAL PROTEIN: 7.2 g/dL (ref 6.5–8.1)

## 2018-04-06 LAB — MAGNESIUM: Magnesium: 1.7 mg/dL (ref 1.7–2.4)

## 2018-04-06 MED ORDER — SODIUM CHLORIDE 0.9 % IV SOLN
6.3000 mg/kg | Freq: Once | INTRAVENOUS | Status: AC
  Administered 2018-04-06: 500 mg via INTRAVENOUS
  Filled 2018-04-06: qty 20

## 2018-04-06 MED ORDER — ATROPINE SULFATE 1 MG/ML IJ SOLN
0.5000 mg | Freq: Once | INTRAMUSCULAR | Status: AC | PRN
  Administered 2018-04-06: 0.5 mg via INTRAVENOUS

## 2018-04-06 MED ORDER — IRINOTECAN HCL CHEMO INJECTION 100 MG/5ML
140.0000 mg/m2 | Freq: Once | INTRAVENOUS | Status: DC
  Filled 2018-04-06: qty 13

## 2018-04-06 MED ORDER — PALONOSETRON HCL INJECTION 0.25 MG/5ML
INTRAVENOUS | Status: AC
  Filled 2018-04-06: qty 5

## 2018-04-06 MED ORDER — SODIUM CHLORIDE 0.9 % IV SOLN
Freq: Once | INTRAVENOUS | Status: AC
  Administered 2018-04-06: 10:00:00 via INTRAVENOUS
  Filled 2018-04-06: qty 250

## 2018-04-06 MED ORDER — IRINOTECAN HCL CHEMO INJECTION 100 MG/5ML
112.0000 mg/m2 | Freq: Once | INTRAVENOUS | Status: AC
  Administered 2018-04-06: 220 mg via INTRAVENOUS
  Filled 2018-04-06: qty 11

## 2018-04-06 MED ORDER — DEXAMETHASONE SODIUM PHOSPHATE 10 MG/ML IJ SOLN
INTRAMUSCULAR | Status: AC
  Filled 2018-04-06: qty 1

## 2018-04-06 MED ORDER — PALONOSETRON HCL INJECTION 0.25 MG/5ML
0.2500 mg | Freq: Once | INTRAVENOUS | Status: AC
  Administered 2018-04-06: 0.25 mg via INTRAVENOUS

## 2018-04-06 MED ORDER — DEXAMETHASONE SODIUM PHOSPHATE 10 MG/ML IJ SOLN
10.0000 mg | Freq: Once | INTRAMUSCULAR | Status: AC
  Administered 2018-04-06: 10 mg via INTRAVENOUS

## 2018-04-06 MED ORDER — ATROPINE SULFATE 1 MG/ML IJ SOLN
INTRAMUSCULAR | Status: AC
  Filled 2018-04-06: qty 1

## 2018-04-06 NOTE — Progress Notes (Signed)
Spoke w/ Dr. Burr Medico, decrease irinotecan dose by 20%. Order updated to irinotecan 112 mg/m2.   Demetrius Charity, PharmD Oncology Pharmacist Pharmacy Phone: [phone] 04/06/2018

## 2018-04-06 NOTE — Patient Instructions (Signed)
[facility] Discharge Instructions for Patients Receiving Chemotherapy  Today you received the following chemotherapy agents: Vectibix, Irinotecan  To help prevent nausea and vomiting after your treatment, we encourage you to take your nausea medication as directed.   If you develop nausea and vomiting that is not controlled by your nausea medication, call the clinic.   BELOW ARE SYMPTOMS THAT SHOULD BE REPORTED IMMEDIATELY:  *FEVER GREATER THAN 100.5 F  *CHILLS WITH OR WITHOUT FEVER  NAUSEA AND VOMITING THAT IS NOT CONTROLLED WITH YOUR NAUSEA MEDICATION  *UNUSUAL SHORTNESS OF BREATH  *UNUSUAL BRUISING OR BLEEDING  TENDERNESS IN MOUTH AND THROAT WITH OR WITHOUT PRESENCE OF ULCERS  *URINARY PROBLEMS  *BOWEL PROBLEMS  UNUSUAL RASH Items with * indicate a potential emergency and should be followed up as soon as possible.  Feel free to call the clinic should you have any questions or concerns. The clinic phone number is [phone].  Please show the CHEMO ALERT CARD at check-in to the Emergency Department and triage nurse.   

## 2018-04-06 NOTE — Progress Notes (Signed)
[place]  [phone] [phone]  Clinic Follow Up Note   Patient Care Team: Moshe Cipro, MD as PCP - General (Internal Medicine) Alexis Frock, MD as Consulting Physician (Urology) Michael Boston, MD as Consulting Physician (General Surgery) Toma Deiters, MD as Referring Physician (Gastroenterology)   Date of Service:  04/06/2018  CHIEF COMPLAINTS:  Follow up Metastatic sigmoid colon Adenocarcinoma  Oncology History   Cancer Staging Cancer of left colon [facility]) Staging form: Colon and Rectum, AJCC 8th Edition - Clinical stage from 09/04/2016: Stage IVB (cTX, cNX, pM1b) - Signed by Truitt Merle, MD on 11/12/2016       Cancer of sigmoid colon ([place])   09/02/2016 Procedure    Colonoscopy Diverticulosis (scattered). Pt has about 10 cm structure starting at 20 cm with abnormal mucosa. Biopsies taken.     09/02/2016 Pathology Results    MODERATELY DIFFERENTIATED ADENOCARCINOMA OF COLON AT 20 CM    09/04/2016 Initial Diagnosis    Cancer of left colon ([place])    09/06/2016 Imaging    CT CAP w Contrast 1. No acute abdominopelvic process.  2. Heterogenous hypodense area involving the medial edge of the left kidney with multiple small soft tissue like stranding involving the left pararenal space. Findings are suspicious for malignancy of the left kidney with small pararenal metastases. MRI abdomen with contrast should be considered to further evaluate.  3. Vague hypodensity involving the lateral segment of the liver. This can also be reevaluated with MRI.  4. Partially visualized 5 mm posterior right lower lobe nodular density. CT chest without contrast follow up is recommended.  5. Complex multilobular left ovarian lesion may reflect a cluster of cysts.    10/10/2016 Procedure    Operative Report by Dr. Tresa Moore on 10/18/16 Procedure: 1) Cystoscopy, left retrograde pyelogram interpretation. 2) Left diagnostic uteroscopy. 3) Insertion of left uteral  stent; 5 x 24 Polaris with tether. Findings: 1) Unremarkable bladder. 2) Unremarkable left retrograde pyelogram. 3) No evidence of intraluminal urothelial neoplasm whatsoever with insepction of the left kidney and ureter times several. 4) Successful placement of left ureteral stent, proximal in renal pelvis and distal in urinary bladder.    10/15/2016 Imaging    MRI abdomen pelvis 1. There are 2 enhancing hepatic lesions highly worrisome for metastatic disease, likely metastatic colon cancer. 2. The lesion of concern in the anterior suprahilar lip of the left kidney is also enhancing, worrisome for neoplasm. This has a somewhat atypical appearance for renal cell carcinoma and could reflect a metastasis as well. 3. New left-sided hydroureter with delayed contrast excretion consistent with distal ureteral obstruction. Specific etiology uncertain. 4. Complex cystic and solid left adnexal lesion could reflect cystic neoplasm, although does not appear aggressive or appear to reflect the cause of the distal left ureteral obstruction. 5. Sigmoid colon wall thickening could be related to reported newly diagnosed colon cancer.    11/06/2016 PET scan    PET 11/06/2016 IMPRESSION: 1. Hypermetabolic pulmonary and hepatic lesions, most consistent with metastatic colon cancer, with the hypermetabolic primary located in the sigmoid colon. 2. Left renal and left adnexal lesions, better seen and evaluated on 10/15/2016. 3. Persistent moderate left hydronephrosis. 4. Aortic atherosclerosis (ICD10-170.0).    11/11/2016 Pathology Results    Liver, needle/core biopsy, left lobe - METASTATIC ADENOCARCINOMA. The histologic features are consistent with a primary gastrointestinal adenocarcinoma.    05/19/2017 Imaging    CT Abdomen Pelvis, 05/19/2017 IMPRESSION: 1. Worsening pulmonary and hepatic metastatic disease. Primary sigmoid lesion is  stable to minimally enlarged. 2. Stable to slight enlargement  of a left perinephric mass, likely a metastasis. Renal cell carcinoma is not excluded. Associated pelvocaliectasis versus mild hydronephrosis. 3. Borderline left periaortic lymph nodes, slightly enlarged. 4. Cholelithiasis. 5.  Aortic atherosclerosis (ICD10-170.0). 6. Cystic and solid lesion in the left adnexa, as on prior exams, likely ovarian in origin.    06/06/2017 - 01/16/2018 Chemotherapy    IV antibody Vectibix every [redacted] weeks along with oral chemo Xeloda for 1 week on and 1 week off starting 06/06/17. Due to skin toxicities we reduced Xeloda to 2000 mg in the AM and 156m in the PM 1 week on and 1 week off on 10/31/17. She had multiple treatment break due to hospitalization. Restarted treatment on 12/26/17. Chemo break 01/16/18-02/20/18    08/07/2017 - 08/16/2017 Hospital Admission    Admit date: 08/07/17 Admission diagnosis: Bowel Obstruction  Additional comments: She was admitted to the hospital on 08/07/17 after [facility] due to a bowel obstruction.  She had a sigmoid colon stent placement on 08/11/17. Bowel obstruction resolved.  CT scan in hospital showed great partial response to chemotherapy. She will continue chemotherapy given good overall tolerance.      08/07/2017 Imaging    CT AP W Contrast 08/07/17 IMPRESSION: Low colonic obstruction secondary to a rectosigmoid stricture at the site of the patient's prior colonic mass, which is no longer evident on CT. Mild upstream colonic wall thickening raises the possibility of superimposed colitis.  Improving hepatic metastases, as above. Left perirenal metastasis, decreased. Visualized pulmonary metastases at the lung bases are grossly unchanged.  3.5 cm mixed cystic/solid left ovarian mass is mildly decreased.     08/11/2017 Procedure    By Dr. MWatt Climes Flexible Sigmoidoscopy 08/11/17 IMPRESSION:   - Preparation of the colon was fair. - Likely malignant partially obstructing tumor in the  recto-sigmoid colon versus necrotic scarring. Injected with contrast proximal to obstruction to confirm proper position. Prosthesis placed. - Stool in the rectum. - No specimens collected.     10/22/2017 Imaging    CT CAP W Contrast 10/22/17 IMPRESSION: 1. Significant interval improvement and liver metastasis. 2. The left perinephric mass is slightly decreased in size in the interval. 3. No significant interval change in the appearance of multiple pulmonary metastasis. 4. Stable appearance of mediastinal adenopathy. 5. Gallstones 6.  Aortic Atherosclerosis (ICD10-I70.0). 7. Colonic stent remains in place without evidence for bowel obstruction.    12/01/2017 - 12/07/2017 Hospital Admission    Admit date: 12/01/17 Admission diagnosis: Bowel Obstruction and abdominal pain Additional comments: had flexible sigmoidoscopy by Dr. MWatt Climeson 12/04/17. Chemo was held during this time.     12/04/2017 Procedure    Flexible Sigmoidoscopy 12/04/17 IMPRESSION - Preparation of the colon was fair. - Stent in the colon. - Stricture in the distal sigmoid colon. Prosthesis placed. - No specimens collected.    02/18/2018 Imaging    CT CAP W Contrast 02/18/18 IMPRESSION: CT CHEST IMPRESSION 1. Bilateral pulmonary metastasis. Compared to the 12/01/2017 abdominal CT, mixed response to therapy. Compared to the 10/22/2017 chest CT, overall slight progression. 2. Similar thoracic adenopathy. 3.  Aortic Atherosclerosis (ICD10-I70.0). CT ABDOMEN AND PELVIS IMPRESSION 1. Since 12/01/2017, similar hepatic metastatic burden. Left renal/perirenal lesion is similar to minimally decreased in size. 2. Sigmoid colonic stent in place with resolution of colonic obstruction. 3. Similar mild soft tissue fullness within both ovaries.    02/20/2018 -  Chemotherapy    second line Irinotecan  and Victibix every 2 weeks on 02/20/2018     HISTORY OF PRESENTING ILLNESS (10/25/2016):  Mary [name] 84 y.o. female is here  because of adenocarcinoma of the colon and a left renal mass. The pt noticed worsening episodes of abdominal cramping and nausea with bowel movements in July 2017. She had an abdominal US performed, which was unremarkable. A colonoscopy was then performed at the patient's request, which found a mass about 20 cm from the anal verge. Biopsy showed moderately differentiated adenocarcinoma. MRI done, with a lesion noticed in the left kidney near the pelvis, a 11 mm hepatic lesion, 4 mm lung nodule, and possible cystic left ovarian mass. Pt saw Dr. Johney Maine for information on abdominal surgery for her colon, which she declined. She also saw Dr. Lorna Dibble, who is considering a nephrectomy, but is planning a sternoscopy to better characterize the kidney lesion. She presents today for continued treatment.    She presents today with her son and daughter-in-law. The symptoms started in late July 2017. She had some vomiting, abdominal pain, and constipation, but not bleeding. Her PCP never found any anemia. Initially she lost her appetite, but now she is taking multiple vitamins and supplements, so her appetite is back. Her bowel has improved, but she still has abdominal pain that comes and goes, lasting minutes. The pain is tolerable with aleve. She did not tolerate Tramadol well; nausea and vomiting. She has some occasional left sides back pain. Denies weight loss, or any other concerns. She is able to do everything she needs to around the house.   She had a partial hysterectomy, and a lumpectomy. She has a history of HTN, atrial fibrillation, high cholesterol. She has some hearing loss and can not see out of her right eye. Not a diabetic, but her son and her mother are. No family history of colon cancer. Her sister had breast cancer in her 31's and her brother had lung cancer in his 74's. Never smoker. Doesn't drink alcohol. 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 She has two sons; one lives an hour away, and the other lives  in [place]. She lives alone.   CURRENT THERAPY:  Second line Irinotecan and Vectibix every 2 weeks started on 02/20/2018  INTERVAL HISTORY:   ALEANNA MENGE is here for a follow up and cycle 4 second-line treatment. She presents to the clinic today at the infusion room alone. She is doing well except for an oral ulcer. She states that she had mild diarrhea after her last treatment, for which she used imodium. She denies using pain medications. Her appetite is fair and she is trying to eat.  Her daughter states that the pt gets quite fatigued and has not been able to do much at home lately, she is concerned about the significant side effects from chemo treatment.     MEDICAL HISTORY:  Past Medical History:  Diagnosis Date  . Aneurysm of ascending aorta ([facility]) 12/03/2016   3.7 cm by echo 10/2016  . Colon cancer [facility])    dx via biospy from colonoscopy-- malignant ---  currently having work-up done w/ dr gross (general surgeon) and coordinate surgery w/ urologsit for renal tumor  . Essential hypertension 10/31/2016  . GERD (gastroesophageal reflux disease)   . Glaucoma   . Hiatal hernia   . HOH (hard of hearing)    bilateral even w/ hearing aids  . Hyperlipidemia   . Kidney tumor    left side  . Legally blind in right eye,  as defined in [place]    due to macular degeneration  . Macular degeneration   . Nausea    intermittant due to colon mass  . Ovarian cyst   . Persistent atrial fibrillation ([place]) 10/31/2016   on ASA only  . PONV (postoperative nausea and vomiting)   . Wears glasses   . Wears hearing aid    bilateral    SURGICAL HISTORY: Past Surgical History:  Procedure Laterality Date  . BREAST CYST EXCISION  1990   benign  . COLONIC STENT PLACEMENT N/A 12/04/2017   Procedure: COLONIC STENT PLACEMENT;  Surgeon: Clarene Essex, MD;  Location: [facility];  Service: Endoscopy;  Laterality: N/A;  . COLONOSCOPY  09/04/2016  . CYSTOSCOPY WITH RETROGRADE PYELOGRAM, URETEROSCOPY AND  STENT PLACEMENT Left 10/10/2016   Procedure: CYSTOSCOPY WITH RETROGRADE PYELOGRAM, DIAGNOSTIC URETEROSCOPY  AND STENT PLACEMENT;  Surgeon: Alexis Frock, MD;  Location: [facility];  Service: Urology;  Laterality: Left;  . ESOPHAGOGASTRODUODENOSCOPY  11/07/2014  . FLEXIBLE SIGMOIDOSCOPY N/A 08/11/2017   Procedure: FLEXIBLE SIGMOIDOSCOPY;  Surgeon: Clarene Essex, MD;  Location: [facility];  Service: Endoscopy;  Laterality: N/A;  With Fluoroscopy for colonic stent placement for colonic stricture  . FLEXIBLE SIGMOIDOSCOPY N/A 12/04/2017   Procedure: FLEXIBLE SIGMOIDOSCOPY;  Surgeon: Clarene Essex, MD;  Location: [facility];  Service: Endoscopy;  Laterality: N/A;  . NASAL SINUS SURGERY    . TONSILLECTOMY      SOCIAL HISTORY: Social History   Socioeconomic History  . Marital status: Widowed    Spouse name: Not on file  . Number of children: Not on file  . Years of education: Not on file  . Highest education level: Not on file  Occupational History  . Not on file  Social Needs  . Financial resource strain: Not on file  . Food insecurity:    Worry: Not on file    Inability: Not on file  . Transportation needs:    Medical: Not on file    Non-medical: Not on file  Tobacco Use  . Smoking status: Never Smoker  . Smokeless tobacco: Never Used  Substance and Sexual Activity  . Alcohol use: No  . Drug use: No  . Sexual activity: Not on file  Lifestyle  . Physical activity:    Days per week: Not on file    Minutes per session: Not on file  . Stress: Not on file  Relationships  . Social connections:    Talks on phone: Not on file    Gets together: Not on file    Attends religious service: Not on file    Active member of club or organization: Not on file    Attends meetings of clubs or organizations: Not on file    Relationship status: Not on file  . Intimate partner violence:    Fear of current or ex partner: Not on file    Emotionally abused: Not on file     Physically abused: Not on file    Forced sexual activity: Not on file  Other Topics Concern  . Not on file  Social History Narrative  . Not on file    FAMILY HISTORY: Family History  Problem Relation Age of Onset  . Cancer Sister 63       breast cancer   . Cancer Brother 48       lung cancer   . Diabetes Son   . High blood pressure Son   . Peripheral Artery Disease Mother   .  High blood pressure Mother     ALLERGIES:  is allergic to benazepril; tylenol [acetaminophen]; and bactrim [sulfamethoxazole-trimethoprim].  MEDICATIONS:  Current Outpatient Medications  Medication Sig Dispense Refill  . amLODipine (NORVASC) 5 MG tablet Take 1 tablet (5 mg total) by mouth daily. 90 tablet 1  . Bisacodyl (DULCOLAX PO) Take 2 tablets by mouth 2 (two) times daily.    . bisacodyl (DULCOLAX) 10 MG suppository Place 10 mg rectally as needed for moderate constipation.    Marland Kitchen diltiazem (CARDIZEM CD) 240 MG 24 hr capsule Take 1 capsule (240 mg total) by mouth daily. 90 capsule 1  . diltiazem (CARDIZEM CD) 240 MG 24 hr capsule TAKE 1 CAPSULE BY MOUTH EVERY DAY 90 capsule 2  . escitalopram (LEXAPRO) 10 MG tablet Take 10 mg by mouth daily.    . fexofenadine (ALLEGRA) 180 MG tablet Take 180 mg by mouth daily.    Marland Kitchen latanoprost (XALATAN) 0.005 % ophthalmic solution Place 1 drop into both eyes at bedtime.    Marland Kitchen losartan (COZAAR) 100 MG tablet Take 1 tablet (100 mg total) by mouth daily. 90 tablet 0  . Multiple Vitamins-Minerals (VITEYES AREDS ADVANCED) CAPS Take 1 capsule by mouth daily.    Marland Kitchen omeprazole (PRILOSEC) 40 MG capsule Take 40 mg by mouth daily.    . ondansetron (ZOFRAN ODT) 4 MG disintegrating tablet Take 1 tablet (4 mg total) by mouth every 8 (eight) hours as needed for nausea or vomiting. 30 tablet 0  . oxyCODONE (OXY IR/ROXICODONE) 5 MG immediate release tablet Take 1 tablet (5 mg total) by mouth every 6 (six) hours as needed for moderate pain or severe pain. 90 tablet 0  . Polyethyl  Glycol-Propyl Glycol (SYSTANE) 0.4-0.3 % SOLN Apply 1 drop to eye daily as needed (dry eyes).     . polyethylene glycol (MIRALAX / GLYCOLAX) packet Take 17 g by mouth daily as needed for moderate constipation. 30 each 0  . Potassium Chloride ER 20 MEQ TBCR Take 20 mEq by mouth daily. 30 tablet 0  . prochlorperazine (COMPAZINE) 10 MG tablet Take 1 tablet (10 mg total) by mouth every 6 (six) hours as needed for nausea or vomiting. 30 tablet 0  . sennosides-docusate sodium (SENOKOT-S) 8.6-50 MG tablet Take 1-2 tablets by mouth 3 (three) times daily.    . urea (CARMOL) 10 % cream Apply topically as needed. 71 g 0   Current Facility-Administered Medications  Medication Dose Route Frequency Provider Last Rate Last Dose  . sodium chloride flush (NS) 0.9 % injection 3 mL  3 mL Intravenous PRN Skeet Latch, MD        REVIEW OF SYSTEMS:   Constitutional: Denies fevers, chills or abnormal (+) complete hair loss  Eyes: Denies blurriness of vision, double vision or watery eyes Ears, nose, mouth, throat, and face: Denies mucositis or sore throat (+) oral ulcer Respiratory: Denies cough, dyspnea or wheezes Cardiovascular: Denies palpitation, chest discomfort  Gastrointestinal:  Denies heartburn (+) improved Bms Skin: Denies rashes  Lymphatics: Denies new lymphadenopathy or easy bruising MSK: negative Neurological:Denies numbness, tingling or new weaknesses Behavioral/Psych: Mood is stable, no new changes  All other systems were reviewed with the patient and are negative.  PHYSICAL EXAMINATION:  ECOG PERFORMANCE STATUS: 1 - Symptomatic but completely ambulatory  Vitals: BP 128/91 Pulse 66 RR 16 GENERAL:alert, no distress and comfortable SKIN: skin color, texture, turgor are normal  EYES: normal, conjunctiva are pink and non-injected, sclera clear OROPHARYNX:no exudate, no erythema and lips, buccal mucosa, and tongue  normal  NECK: supple, thyroid normal size, non-tender, without  nodularity LYMPH:  no palpable lymphadenopathy in the cervical, axillary or inguinal LUNGS: clear to auscultation and percussion with normal breathing effort HEART: regular rate & rhythm and no murmurs and   ABDOMEN:abdomen soft, mild tenderness at LLQ and right side of abdomen,  (+) active bowel sounds Musculoskeletal:no cyanosis of digits and no clubbing  PSYCH: alert & oriented x 3 with fluent speech NEURO: no focal motor/sensory deficits   LABORATORY DATA:  I have reviewed the data as listed CBC Latest Ref Rng & Units 04/06/2018 03/23/2018 03/09/2018  WBC 3.9 - 10.3 K/uL 7.4 8.8 7.5  Hemoglobin 11.6 - 15.9 g/dL 11.3(L) 11.4(L) 11.3(L)  Hematocrit 34.8 - 46.6 % 34.6(L) 36.0 35.4  Platelets 145 - 400 K/uL 399 350 347   CMP Latest Ref Rng & Units 04/06/2018 03/23/2018 03/09/2018  Glucose 70 - 99 mg/dL 107(H) 86 91  BUN 8 - 23 mg/dL _0 Creatinine 0.44 - 1.00 mg/dL 0.94 0.84 0.84  Sodium 135 - 145 mmol/L 140 138 139  Potassium 3.5 - 5.1 mmol/L 3.9 3.9 3.7  Chloride 98 - 111 mmol/L 106 104 105  CO2 22 - 32 mmol/L _1 Calcium 8.9 - 10.3 mg/dL 9.3 9.3 9.5  Total Protein 6.5 - 8.1 g/dL 7.2 7.8 7.4  Total Bilirubin 0.3 - 1.2 mg/dL 0.3 0.5 0.3  Alkaline Phos 38 - 126 U/L 95 115 86  AST 15 - 41 U/L 13(L) 16 14(L)  ALT 0 - 44 U/L _2 PATHOLOGY: [facility] 11/11/16   Diagnosis 11/11/16 Liver, needle/core biopsy, left lobe - METASTATIC ADENOCARCINOMA. - SEE COMMENT. Microscopic Comment The histologic features are consistent with a primary gastrointestinal adenocarcinoma. Dr. Vicente Males has reviewed the case and concurs with this interpretation. There is likely sufficient tumor present for additional studies, if requested. Enid Cutter MD Pathologist, Electronic Signature (Case signed 11/12/2016)  Diagnosis 09/04/2016 MODERATELY DIFFERENTIATED ADENOCARCINOMA OF COLON AT 20 CM Tumor Site: Colon at 20 cm Tumor Type: Adenocarcinoma Tumore Size: Unknown  Grade:  Moderately differentiated   RADIOGRAPHIC STUDIES: I have personally reviewed the radiological images as listed and agreed with the findings in the report.   CT CAP W Contrast 02/18/18 IMPRESSION: CT CHEST IMPRESSION 1. Bilateral pulmonary metastasis. Compared to the 12/01/2017 abdominal CT, mixed response to therapy. Compared to the 10/22/2017 chest CT, overall slight progression. 2. Similar thoracic adenopathy. 3.  Aortic Atherosclerosis (ICD10-I70.0). CT ABDOMEN AND PELVIS IMPRESSION 1. Since 12/01/2017, similar hepatic metastatic burden. Left renal/perirenal lesion is similar to minimally decreased in size. 2. Sigmoid colonic stent in place with resolution of colonic obstruction. 3. Similar mild soft tissue fullness within both ovaries.   DG Abdomen 11/28/17 IMPRESSION: Stent within the sigmoid colon. Gaseous distention of bowel above the stent concerning for a degree of bowel obstruction.  CT CAP W Contrast 10/22/17 IMPRESSION: 1. Significant interval improvement and liver metastasis. 2. The left perinephric mass is slightly decreased in size in the interval. 3. No significant interval change in the appearance of multiple pulmonary metastasis. 4. Stable appearance of mediastinal adenopathy. 5. Gallstones 6.  Aortic Atherosclerosis (ICD10-I70.0). 7. Colonic stent remains in place without evidence for bowel obstruction.  CT AP W Contrast 08/07/17 IMPRESSION: Low colonic obstruction secondary to a rectosigmoid stricture at the site of the patient's prior colonic mass, which is no longer evident on CT. Mild upstream colonic wall thickening raises the possibility of superimposed colitis.Improving hepatic metastases,  as above. Left perirenal metastasis, decreased. Visualized pulmonary metastases at the lung bases are grossly unchanged. 3.5 cm mixed cystic/solid left ovarian mass is mildly decreased.  CT Chest 06/06/17  IMPRESSION: 1. There are multiple new and enlarging  pulmonary nodules throughout the lungs bilaterally. Additionally, a few of the nodules have decreased in size when compared to prior. 2. Interval increase in size of precarinal lymph node. 3. Interval increase in size of hepatic lesions. 4. Aortic Atherosclerosis (ICD10-I70.0).  CT Abdomen Pelvis, 05/19/2017 IMPRESSION: 1. Worsening pulmonary and hepatic metastatic disease. Primary sigmoid lesion is stable to minimally enlarged. 2. Stable to slight enlargement of a left perinephric mass, likely a metastasis. Renal cell carcinoma is not excluded. Associated pelvocaliectasis versus mild hydronephrosis. 3. Borderline left periaortic lymph nodes, slightly enlarged. 4. Cholelithiasis. 5.  Aortic atherosclerosis (ICD10-170.0). 6. Cystic and solid lesion in the left adnexa, as on prior exams, likely ovarian in origin.  PET 11/06/2016 IMPRESSION: 1. Hypermetabolic pulmonary and hepatic lesions, most consistent with metastatic colon cancer, with the hypermetabolic primary located in the sigmoid colon. 2. Left renal and left adnexal lesions, better seen and evaluated on 10/15/2016. 3. Persistent moderate left hydronephrosis. 4. Aortic atherosclerosis (ICD10-170.0).  MRI Abdomen Pelvis w/wo Contrast 10/15/2016 IMPRESSION: 1. There are 2 enhancing hepatic lesions highly worrisome for metastatic disease, likely metastatic colon cancer. 2. The lesion of concern in the anterior suprahilar lip of the left kidney is also enhancing, worrisome for neoplasm. This has a somewhat atypical appearance for renal cell carcinoma and could reflect a metastasis as well. 3. New left-sided hydroureter with delayed contrast excretion consistent with distal ureteral obstruction. Specific etiology uncertain. 4. Complex cystic and solid left adnexal lesion could reflect cystic neoplasm, although does not appear aggressive or appear to reflect the cause of the distal left ureteral obstruction. 5. Sigmoid colon wall  thickening could be related to reported newly diagnosed colon cancer.  CT CAP w Contrast 09/06/2016 (done outside) IMPRESSION: 1. No acute abdominopelvic process.  2. Heterogenous hypodense area involving the medial edge of the left kidney with multiple small soft tissue like stranding involving the left pararenal space. Findings are suspicious for malignancy of the left kidney with small pararenal metastases. MRI abdomen with contrast should be considered to further evaluate.  3. Vague hypodensity involving the lateral segment of the liver. This can also be reevaluated with MRI.  4. Partially visualized 5 mm posterior right lower lobe nodular density. CT chest without contrast follow up is recommended.  5. Complex multilobular left ovarian lesion may reflect a cluster of cysts.   PROCEDURES  Flexible Sigmoidoscopy 12/04/17 IMPRESSION - Preparation of the colon was fair. - Stent in the colon. - Stricture in the distal sigmoid colon. Prosthesis placed. - No specimens collected.   Flexible Sigmoidoscopy 08/11/17 IMPRESSION:   - Preparation of the colon was fair. - Likely malignant partially obstructing tumor in the recto-sigmoid colon versus necrotic scarring. Injected with contrast proximal to obstruction to confirm proper position. Prosthesis placed. - Stool in the rectum. - No specimens collected.   Echocardiogram 11/03/16 - Left ventricle: The cavity size was normal. Wall thickness was   increased in a pattern of mild LVH. Indeterminate diastolic   function (atrial fibrillation). Systolic function was normal. The   estimated ejection fraction was in the range of 60% to 65%.   Operative Report by Dr. Tresa Moore on 10/10/16 Procedure: 1) Cystoscopy, left retrograde pyelogram interpretation. 2) Left diagnostic uteroscopy. 3) Insertion of left uteral stent; 5 x 24 Polaris  with tether. Findings: 1) Unremarkable bladder. 2) Unremarkable left retrograde pyelogram. 3) No evidence of  intraluminal urothelial neoplasm whatsoever with inspection of the left kidney and ureter times several. 4) Successful placement of left ureteral stent, proximal in renal pelvis and distal in urinary bladder.  Colonoscopy 09/02/2016 FINDINGS: Diverticulosis (scattered). Pt has about 10 cm structure starting at 20 cm with abnormal mucosa. The colonoscopy scope was not able to advance due to structure, and upper endoscopy scope was used and it was advanced through the stricture to see ileocecal valve. Biopsies at the stricture were taken.   ASSESSMENT & PLAN:  Mary [name] is a 84 y.o. female   1. Adenocarcinoma of left colon, sigmoid colon, TxNxM1 with liver and lung mets, stage IV, KRAS/NRS wild type, MSI-stable  -We previously discussed her incurable nature of metastatic colon cancer, and the atypical of therapy. -She has been on first-line chemotherapy with chemotherapy and Panitumumab, tolerating very well overall, and has had partial response. -However she developed recurrent bowel obstruction secondary to the sigmoid colon mass, status post colonic stent placement twice by Dr. Watt Climes. -I previously reviewed her CT scan from 12/01/17 which showed her bowel obstruction as well as good response to treatment in her liver and lung mets.  -I previously discussed she may have recurrent bowel obstruction in the future, and he can follow-up with Dr. Watt Climes closely. Will watch her symptoms, especially constipation and abdominal pain closely. We also discussed the surgical option of diverting colostomy, she previously saw Dr. Johney Maine, she would like to hold on surgery as long as she can.  -I recommended she continue with her systemic therapy given she is responding well. Pt agreed to continue treatment.  She restarted on 12/26/17. She took a chemo break after 01/16/18 then changed treatment.  -We discussed her CT CAP from 02/18/18 which showed stent adequately placed and no bowel obstruction. B/l lung mets have slight  progression and her liver mets has slight decrease, overall stable disease.  -She now had mild disease progression in the lungs, and has had a recurrent biopsy option it is a primary sigmoid colon site, she has been on low-dose chemo overall, which I feel is not adequately controlled disease. I previously discussed the option of changing treatment to intravenous chemo, such as in undertaken, continue current Xeloda and Vectibix, versus palliative care and hospice. She overall is elderly, does not want have side effects from chemo, is reluctant to take intensive chemotherapy such as FOLFOX or FOLFIRI.  A lengthy discussion, she agreed to change Xeloda to irinotecan, every 2 weeks, I will continue Vectibix given her overall stable disease. Not a candidate for Avastin due to the recurrent bowel obstruction. Side effects were discussed with patient in great detail. She agreed to proceed to start on 02/20/18.  -She continues to tolerate irinotecan moderate well with mild diarrhea, fatigue and an oral ulcer, GI issues overall resolved currently.  However her daughter-in-law is very concerned about her fatigue, not able to do much at home since she started chemo. -Labs revewied, mild anemia and but adequate to proceed with treatment today with reduced irinotecan dose by 20% due to her fatigue. I suggest she take multivitamin with iron.  -If she is still not able to tolerate the chemotherapy well with reduced dose, I would consider stopping treatment, will discuss with her and her family at next visit  -Next scan in 04/2018 after cycle 5.   2. Constipation, abdominal pain  -I previously reviewed laxative use. She will  take a daily dose of Miralax up to 3-4 times a day and if she does not have a BM for 3-4 days she will also use Senokot-S. -Pt notes Magnesium citrate caused significant bloating and she does not want to take it again.  -She is currently on oxycodone 3-4 times a day  -Due to recurrent bowel  obstructions, on 12/04/17 Dr. Watt Climes did a stent placement.  -I previously recommend she increase her Senakot-S to 2-3 tabs BID and continue Miralax 30 minutes postprandial.  -Her BM have become more loose (mild diarrhea) and stable due to chemo and her pain is resolved currently and not taking pain medication.  -Overall improved after she started irinotecna    3. Left renal mass and left hydronephrosis -Possible renal cell carcinoma, metastatic lesion from colon cancer is also a possibility. -She has been seen by [facility], Dr. Tresa Moore, and had left ureter stent placement on 10/10/2016.  -no signs of hydronephrosis on 02/18/18 CT scan, and a left renal mass is likely decreased but overall similar.   4. Complex cyst of left ovary -will monitor. Likely benign -If she undergoes abdominal surgery, may remove it -She declines surgery for now -08/07/17 CT shows mild decrease in size of cyst -02/18/18 CT shows overall stable cyst   5. Atrial Fibrillation -The patient has a history of atrial fibrillation. -Her cardiologist wants her to undergo cardio inversion and anticoagulation. -I previously advised the patient that even if she undergoes cardio inversion, her Afib might return. Also, her anticoagulation medication might make her bleeding worse. -The patient should continue taking baby aspirin.  6. HTN -I previously advised the patient to continue medications and follow up with primary care physician -BP at 128/91 today   7. Nutrition -We previously discussed palliative chemotherapy will make her fatigued, nauseous, and loss of appetite. -I previously stressed the importance of a healthy diet with plenty of protein. -I previously referred to Nutrition.  -She continues to gain weight lately   8.Goal of care discussion  -We previously discussed the incurable nature of her cancer, and the overall poor prognosis, especially if she does not have good response to chemotherapy or progress on chemo -The  patient understands the goal of care is palliative. -I previously recommended DNR/DNI, she will think about it   9. Partial bowel obstruction, s/p sigmoid stent placement on 08/11/17 and again on 12/04/17 -Hospitalized on 08/07/17, and had stent placed on 08/11/17.  -She now had recurrent bowel obstruction as of 11/28/17. She was given lactulose and put on a liquid diet on 11/28/17.  -Without much relief she was admitted to the hospital on 12/01/17 for her bowel obstruction and abdominal pain.  -She had a flexible sigmoidoscopy by Dr. Watt Climes on 12/04/17 and had stent placed.  -If endoscopy procedure is not able to resolve the obstruction, then surgery with diverting colostomy may be the only option. She is open to that.  -She will see Dr. Watt Climes in 4 months to monitor her stents, or sooner if needed -Her pain resolved and stool softened due to current chemotherapy. I previously encouraged her to watch for signs of bowel obstruction.    Plan:  -Labs reviewed and adequate to proceed cycle 4 irinotecan/Vectibix, at reduced irinotecan dose by 20% due to her significant fatigue, and decreased functional status at home, per her daughter-in-law -Lab, f/u and irinotecan/Vectibix in 2 weeks  -f/u in 2 weeks    All questions were answered. The patient knows to call the clinic with any problems,  questions or concerns. I spent a total of 30 minutes for her visit, more than 50% face-to-face counseling.  Dierdre Searles Dweik am acting as scribe for Dr. Truitt Merle. I have reviewed the above documentation for accuracy and completeness, and I agree with the above.          Truitt Merle, MD 04/06/2018

## 2018-04-20 ENCOUNTER — Ambulatory Visit: Payer: Medicare Other

## 2018-04-20 ENCOUNTER — Inpatient Hospital Stay (HOSPITAL_BASED_OUTPATIENT_CLINIC_OR_DEPARTMENT_OTHER): Payer: Medicare Other | Admitting: Nurse Practitioner

## 2018-04-20 ENCOUNTER — Ambulatory Visit: Payer: Medicare Other | Admitting: Nurse Practitioner

## 2018-04-20 ENCOUNTER — Inpatient Hospital Stay: Payer: Medicare Other | Attending: Hematology

## 2018-04-20 ENCOUNTER — Other Ambulatory Visit: Payer: Medicare Other

## 2018-04-20 ENCOUNTER — Inpatient Hospital Stay: Payer: Medicare Other

## 2018-04-20 ENCOUNTER — Encounter: Payer: Self-pay | Admitting: Nurse Practitioner

## 2018-04-20 ENCOUNTER — Ambulatory Visit: Payer: Medicare Other | Admitting: Hematology

## 2018-04-20 VITALS — BP 132/61 | HR 75 | Temp 97.8°F | Resp 14 | Ht 66.0 in | Wt 173.2 lb

## 2018-04-20 DIAGNOSIS — G629 Polyneuropathy, unspecified: Secondary | ICD-10-CM | POA: Diagnosis not present

## 2018-04-20 DIAGNOSIS — C187 Malignant neoplasm of sigmoid colon: Secondary | ICD-10-CM

## 2018-04-20 DIAGNOSIS — R197 Diarrhea, unspecified: Secondary | ICD-10-CM | POA: Insufficient documentation

## 2018-04-20 DIAGNOSIS — K5900 Constipation, unspecified: Secondary | ICD-10-CM

## 2018-04-20 DIAGNOSIS — C787 Secondary malignant neoplasm of liver and intrahepatic bile duct: Secondary | ICD-10-CM

## 2018-04-20 DIAGNOSIS — R5383 Other fatigue: Secondary | ICD-10-CM | POA: Diagnosis not present

## 2018-04-20 DIAGNOSIS — Z7189 Other specified counseling: Secondary | ICD-10-CM

## 2018-04-20 DIAGNOSIS — Z5112 Encounter for antineoplastic immunotherapy: Secondary | ICD-10-CM | POA: Insufficient documentation

## 2018-04-20 DIAGNOSIS — C7801 Secondary malignant neoplasm of right lung: Secondary | ICD-10-CM | POA: Insufficient documentation

## 2018-04-20 DIAGNOSIS — D490 Neoplasm of unspecified behavior of digestive system: Secondary | ICD-10-CM

## 2018-04-20 DIAGNOSIS — I4891 Unspecified atrial fibrillation: Secondary | ICD-10-CM

## 2018-04-20 DIAGNOSIS — C186 Malignant neoplasm of descending colon: Secondary | ICD-10-CM

## 2018-04-20 DIAGNOSIS — Z5111 Encounter for antineoplastic chemotherapy: Secondary | ICD-10-CM | POA: Diagnosis not present

## 2018-04-20 DIAGNOSIS — I10 Essential (primary) hypertension: Secondary | ICD-10-CM | POA: Diagnosis not present

## 2018-04-20 DIAGNOSIS — C7802 Secondary malignant neoplasm of left lung: Secondary | ICD-10-CM | POA: Insufficient documentation

## 2018-04-20 LAB — CEA (IN HOUSE-CHCC): CEA (CHCC-In House): 3.6 ng/mL (ref 0.00–5.00)

## 2018-04-20 LAB — CBC WITH DIFFERENTIAL (CANCER CENTER ONLY)
BASOS PCT: 0 %
Basophils Absolute: 0 10*3/uL (ref 0.0–0.1)
Eosinophils Absolute: 0.2 10*3/uL (ref 0.0–0.5)
Eosinophils Relative: 2 %
HEMATOCRIT: 36 % (ref 34.8–46.6)
HEMOGLOBIN: 11.4 g/dL — AB (ref 11.6–15.9)
LYMPHS PCT: 14 %
Lymphs Abs: 1.5 10*3/uL (ref 0.9–3.3)
MCH: 28.5 pg (ref 25.1–34.0)
MCHC: 31.7 g/dL (ref 31.5–36.0)
MCV: 90 fL (ref 79.5–101.0)
MONO ABS: 1.3 10*3/uL — AB (ref 0.1–0.9)
MONOS PCT: 12 %
NEUTROS ABS: 7.5 10*3/uL — AB (ref 1.5–6.5)
Neutrophils Relative %: 72 %
Platelet Count: 381 10*3/uL (ref 145–400)
RBC: 4 MIL/uL (ref 3.70–5.45)
RDW: 15.7 % — AB (ref 11.2–14.5)
WBC Count: 10.5 10*3/uL — ABNORMAL HIGH (ref 3.9–10.3)

## 2018-04-20 LAB — CMP (CANCER CENTER ONLY)
ALBUMIN: 3.5 g/dL (ref 3.5–5.0)
ALT: 16 U/L (ref 0–44)
ANION GAP: 11 (ref 5–15)
AST: 14 U/L — ABNORMAL LOW (ref 15–41)
Alkaline Phosphatase: 88 U/L (ref 38–126)
BUN: 11 mg/dL (ref 8–23)
CALCIUM: 9.6 mg/dL (ref 8.9–10.3)
CHLORIDE: 107 mmol/L (ref 98–111)
CO2: 24 mmol/L (ref 22–32)
Creatinine: 0.89 mg/dL (ref 0.44–1.00)
GFR, Est AFR Am: >60 mL/min (ref >60)
GFR, Estimated: 59 mL/min — ABNORMAL LOW (ref >60)
GLUCOSE: 86 mg/dL (ref 70–99)
POTASSIUM: 3.8 mmol/L (ref 3.5–5.1)
SODIUM: 142 mmol/L (ref 135–145)
Total Bilirubin: 0.4 mg/dL (ref 0.3–1.2)
Total Protein: 7.4 g/dL (ref 6.5–8.1)

## 2018-04-20 LAB — MAGNESIUM: MAGNESIUM: 2 mg/dL (ref 1.7–2.4)

## 2018-04-20 MED ORDER — PALONOSETRON HCL INJECTION 0.25 MG/5ML
INTRAVENOUS | Status: AC
  Filled 2018-04-20: qty 5

## 2018-04-20 MED ORDER — SODIUM CHLORIDE 0.9 % IV SOLN
6.3000 mg/kg | Freq: Once | INTRAVENOUS | Status: AC
  Administered 2018-04-20: 500 mg via INTRAVENOUS
  Filled 2018-04-20: qty 5

## 2018-04-20 MED ORDER — IRINOTECAN HCL CHEMO INJECTION 100 MG/5ML
112.0000 mg/m2 | Freq: Once | INTRAVENOUS | Status: AC
  Administered 2018-04-20: 220 mg via INTRAVENOUS
  Filled 2018-04-20: qty 11

## 2018-04-20 MED ORDER — DEXAMETHASONE SODIUM PHOSPHATE 10 MG/ML IJ SOLN
INTRAMUSCULAR | Status: AC
  Filled 2018-04-20: qty 1

## 2018-04-20 MED ORDER — DEXAMETHASONE SODIUM PHOSPHATE 10 MG/ML IJ SOLN
10.0000 mg | Freq: Once | INTRAMUSCULAR | Status: AC
  Administered 2018-04-20: 10 mg via INTRAVENOUS

## 2018-04-20 MED ORDER — SODIUM CHLORIDE 0.9 % IV SOLN
Freq: Once | INTRAVENOUS | Status: AC
  Administered 2018-04-20: 12:00:00 via INTRAVENOUS
  Filled 2018-04-20: qty 250

## 2018-04-20 MED ORDER — ATROPINE SULFATE 1 MG/ML IJ SOLN
0.5000 mg | Freq: Once | INTRAMUSCULAR | Status: AC | PRN
  Administered 2018-04-20: 0.5 mg via INTRAVENOUS

## 2018-04-20 MED ORDER — PALONOSETRON HCL INJECTION 0.25 MG/5ML
0.2500 mg | Freq: Once | INTRAVENOUS | Status: AC
  Administered 2018-04-20: 0.25 mg via INTRAVENOUS

## 2018-04-20 MED ORDER — ATROPINE SULFATE 1 MG/ML IJ SOLN
INTRAMUSCULAR | Status: AC
  Filled 2018-04-20: qty 1

## 2018-04-20 NOTE — Progress Notes (Signed)
Per Dr. Burr Medico, decrease irinotecan dose to 112 mg/m2 today and for future infusions. Orders updated.   Demetrius Charity, PharmD Oncology Pharmacist Pharmacy Phone: [phone] 04/20/2018

## 2018-04-20 NOTE — Progress Notes (Signed)
[place]  [phone] [phone]  Clinic Follow up Note   Patient Care Team: Moshe Cipro, MD as PCP - General (Internal Medicine) Alexis Frock, MD as Consulting Physician (Urology) Michael Boston, MD as Consulting Physician (General Surgery) Toma Deiters, MD as Referring Physician (Gastroenterology) 04/20/2018  SUMMARY OF ONCOLOGIC HISTORY: Oncology History   Cancer Staging Cancer of left colon [facility]) Staging form: Colon and Rectum, AJCC 8th Edition - Clinical stage from 09/04/2016: Stage IVB (cTX, cNX, pM1b) - Signed by Truitt Merle, MD on 11/12/2016       Cancer of sigmoid colon ([place])   09/02/2016 Procedure    Colonoscopy Diverticulosis (scattered). Pt has about 10 cm structure starting at 20 cm with abnormal mucosa. Biopsies taken.     09/02/2016 Pathology Results    MODERATELY DIFFERENTIATED ADENOCARCINOMA OF COLON AT 20 CM    09/04/2016 Initial Diagnosis    Cancer of left colon ([place])    09/06/2016 Imaging    CT CAP w Contrast 1. No acute abdominopelvic process.  2. Heterogenous hypodense area involving the medial edge of the left kidney with multiple small soft tissue like stranding involving the left pararenal space. Findings are suspicious for malignancy of the left kidney with small pararenal metastases. MRI abdomen with contrast should be considered to further evaluate.  3. Vague hypodensity involving the lateral segment of the liver. This can also be reevaluated with MRI.  4. Partially visualized 5 mm posterior right lower lobe nodular density. CT chest without contrast follow up is recommended.  5. Complex multilobular left ovarian lesion may reflect a cluster of cysts.    10/10/2016 Procedure    Operative Report by Dr. Tresa Moore on 10/18/16 Procedure: 1) Cystoscopy, left retrograde pyelogram interpretation. 2) Left diagnostic uteroscopy. 3) Insertion of left uteral stent; 5 x 24 Polaris with tether. Findings: 1) Unremarkable  bladder. 2) Unremarkable left retrograde pyelogram. 3) No evidence of intraluminal urothelial neoplasm whatsoever with insepction of the left kidney and ureter times several. 4) Successful placement of left ureteral stent, proximal in renal pelvis and distal in urinary bladder.    10/15/2016 Imaging    MRI abdomen pelvis 1. There are 2 enhancing hepatic lesions highly worrisome for metastatic disease, likely metastatic colon cancer. 2. The lesion of concern in the anterior suprahilar lip of the left kidney is also enhancing, worrisome for neoplasm. This has a somewhat atypical appearance for renal cell carcinoma and could reflect a metastasis as well. 3. New left-sided hydroureter with delayed contrast excretion consistent with distal ureteral obstruction. Specific etiology uncertain. 4. Complex cystic and solid left adnexal lesion could reflect cystic neoplasm, although does not appear aggressive or appear to reflect the cause of the distal left ureteral obstruction. 5. Sigmoid colon wall thickening could be related to reported newly diagnosed colon cancer.    11/06/2016 PET scan    PET 11/06/2016 IMPRESSION: 1. Hypermetabolic pulmonary and hepatic lesions, most consistent with metastatic colon cancer, with the hypermetabolic primary located in the sigmoid colon. 2. Left renal and left adnexal lesions, better seen and evaluated on 10/15/2016. 3. Persistent moderate left hydronephrosis. 4. Aortic atherosclerosis (ICD10-170.0).    11/11/2016 Pathology Results    Liver, needle/core biopsy, left lobe - METASTATIC ADENOCARCINOMA. The histologic features are consistent with a primary gastrointestinal adenocarcinoma.    05/19/2017 Imaging    CT Abdomen Pelvis, 05/19/2017 IMPRESSION: 1. Worsening pulmonary and hepatic metastatic disease. Primary sigmoid lesion is stable to minimally enlarged. 2. Stable to slight enlargement of a left  perinephric mass, likely a metastasis. Renal cell  carcinoma is not excluded. Associated pelvocaliectasis versus mild hydronephrosis. 3. Borderline left periaortic lymph nodes, slightly enlarged. 4. Cholelithiasis. 5.  Aortic atherosclerosis (ICD10-170.0). 6. Cystic and solid lesion in the left adnexa, as on prior exams, likely ovarian in origin.    06/06/2017 - 01/16/2018 Chemotherapy    IV antibody Vectibix every [redacted] weeks along with oral chemo Xeloda for 1 week on and 1 week off starting 06/06/17. Due to skin toxicities we reduced Xeloda to 2000 mg in the AM and 1543m in the PM 1 week on and 1 week off on 10/31/17. She had multiple treatment break due to hospitalization. Restarted treatment on 12/26/17. Chemo break 01/16/18-02/20/18    08/07/2017 - 08/16/2017 Hospital Admission    Admit date: 08/07/17 Admission diagnosis: Bowel Obstruction  Additional comments: She was admitted to the hospital on 08/07/17 after [facility] due to a bowel obstruction.  She had a sigmoid colon stent placement on 08/11/17. Bowel obstruction resolved.  CT scan in hospital showed great partial response to chemotherapy. She will continue chemotherapy given good overall tolerance.      08/07/2017 Imaging    CT AP W Contrast 08/07/17 IMPRESSION: Low colonic obstruction secondary to a rectosigmoid stricture at the site of the patient's prior colonic mass, which is no longer evident on CT. Mild upstream colonic wall thickening raises the possibility of superimposed colitis.  Improving hepatic metastases, as above. Left perirenal metastasis, decreased. Visualized pulmonary metastases at the lung bases are grossly unchanged.  3.5 cm mixed cystic/solid left ovarian mass is mildly decreased.     08/11/2017 Procedure    By Dr. MWatt Climes Flexible Sigmoidoscopy 08/11/17 IMPRESSION:   - Preparation of the colon was fair. - Likely malignant partially obstructing tumor in the recto-sigmoid colon versus necrotic scarring. Injected with contrast  proximal to obstruction to confirm proper position. Prosthesis placed. - Stool in the rectum. - No specimens collected.     10/22/2017 Imaging    CT CAP W Contrast 10/22/17 IMPRESSION: 1. Significant interval improvement and liver metastasis. 2. The left perinephric mass is slightly decreased in size in the interval. 3. No significant interval change in the appearance of multiple pulmonary metastasis. 4. Stable appearance of mediastinal adenopathy. 5. Gallstones 6.  Aortic Atherosclerosis (ICD10-I70.0). 7. Colonic stent remains in place without evidence for bowel obstruction.    12/01/2017 - 12/07/2017 Hospital Admission    Admit date: 12/01/17 Admission diagnosis: Bowel Obstruction and abdominal pain Additional comments: had flexible sigmoidoscopy by Dr. MWatt Climeson 12/04/17. Chemo was held during this time.     12/04/2017 Procedure    Flexible Sigmoidoscopy 12/04/17 IMPRESSION - Preparation of the colon was fair. - Stent in the colon. - Stricture in the distal sigmoid colon. Prosthesis placed. - No specimens collected.    02/18/2018 Imaging    CT CAP W Contrast 02/18/18 IMPRESSION: CT CHEST IMPRESSION 1. Bilateral pulmonary metastasis. Compared to the 12/01/2017 abdominal CT, mixed response to therapy. Compared to the 10/22/2017 chest CT, overall slight progression. 2. Similar thoracic adenopathy. 3.  Aortic Atherosclerosis (ICD10-I70.0). CT ABDOMEN AND PELVIS IMPRESSION 1. Since 12/01/2017, similar hepatic metastatic burden. Left renal/perirenal lesion is similar to minimally decreased in size. 2. Sigmoid colonic stent in place with resolution of colonic obstruction. 3. Similar mild soft tissue fullness within both ovaries.    02/20/2018 -  Chemotherapy    second line Irinotecan and Victibix every 2 weeks on 02/20/2018    CURRENT THERAPY:  Second line Irinotecan and Vectibix every 2 weeks started on 02/20/2018  INTERVAL HISTORY: Ms. Schools returns with her family member  for f/u and next cycle chemo as scheduled. She completed cycle 4 (dose-reduced) irinotecan and panitumumab on 8/26. She noticed slight improvement in her fatigue. She requires frequent rest but remains functional at home and leaves house occasionally for church and shopping outings. Has episodic diarrhea and nausea. She occasionally fluctuates between constipation and diarrhea. She took imodium 1 time since last cycle. No blood in stool.  Leg swelling has improved. Has occasional neuropathy without functional limitations. Denies hematuria, dysuria, fever, chills, cough, chest pain, dyspnea, mucositis, or skin rash.   MEDICAL HISTORY:  Past Medical History:  Diagnosis Date  . Aneurysm of ascending aorta (H[facility]) 12/03/2016   3.7 cm by echo 10/2016  . Colon cancer [facility])    dx via biospy from colonoscopy-- malignant ---  currently having work-up done w/ dr gross (general surgeon) and coordinate surgery w/ urologsit for renal tumor  . Essential hypertension 10/31/2016  . GERD (gastroesophageal reflux disease)   . Glaucoma   . Hiatal hernia   . HOH (hard of hearing)    bilateral even w/ hearing aids  . Hyperlipidemia   . Kidney tumor    left side  . Legally blind in right eye, as defined in [place]    due to macular degeneration  . Macular degeneration   . Nausea    intermittant due to colon mass  . Ovarian cyst   . Persistent atrial fibrillation ([place]) 10/31/2016   on ASA only  . PONV (postoperative nausea and vomiting)   . Wears glasses   . Wears hearing aid    bilateral    SURGICAL HISTORY: Past Surgical History:  Procedure Laterality Date  . BREAST CYST EXCISION  1990   benign  . COLONIC STENT PLACEMENT N/A 12/04/2017   Procedure: COLONIC STENT PLACEMENT;  Surgeon: Clarene Essex, MD;  Location: [facility];  Service: Endoscopy;  Laterality: N/A;  . COLONOSCOPY  09/04/2016  . CYSTOSCOPY WITH RETROGRADE PYELOGRAM, URETEROSCOPY AND STENT PLACEMENT Left 10/10/2016   Procedure: CYSTOSCOPY WITH  RETROGRADE PYELOGRAM, DIAGNOSTIC URETEROSCOPY  AND STENT PLACEMENT;  Surgeon: Alexis Frock, MD;  Location[facility];  Service: Urology;  Laterality: Left;  . ESOPHAGOGASTRODUODENOSCOPY  11/07/2014  . FLEXIBLE SIGMOIDOSCOPY N/A 08/11/2017   Procedure: FLEXIBLE SIGMOIDOSCOPY;  Surgeon: Clarene Essex, MD;  Location: [facility];  Service: Endoscopy;  Laterality: N/A;  With Fluoroscopy for colonic stent placement for colonic stricture  . FLEXIBLE SIGMOIDOSCOPY N/A 12/04/2017   Procedure: FLEXIBLE SIGMOIDOSCOPY;  Surgeon: Clarene Essex, MD;  Location: [facility];  Service: Endoscopy;  Laterality: N/A;  . NASAL SINUS SURGERY    . TONSILLECTOMY      I have reviewed the social history and family history with the patient and they are unchanged from previous note.  ALLERGIES:  is allergic to benazepril; tylenol [acetaminophen]; and bactrim [sulfamethoxazole-trimethoprim].  MEDICATIONS:  Current Outpatient Medications  Medication Sig Dispense Refill  . amLODipine (NORVASC) 5 MG tablet Take 1 tablet (5 mg total) by mouth daily. 90 tablet 1  . Bisacodyl (DULCOLAX PO) Take 2 tablets by mouth 2 (two) times daily.    . bisacodyl (DULCOLAX) 10 MG suppository Place 10 mg rectally as needed for moderate constipation.    Marland Kitchen diltiazem (CARDIZEM CD) 240 MG 24 hr capsule TAKE 1 CAPSULE BY MOUTH EVERY DAY 90 capsule 2  . escitalopram (LEXAPRO) 10 MG tablet Take 10 mg by mouth  daily.    . fexofenadine (ALLEGRA) 180 MG tablet Take 180 mg by mouth daily.    Marland Kitchen latanoprost (XALATAN) 0.005 % ophthalmic solution Place 1 drop into both eyes at bedtime.    Marland Kitchen losartan (COZAAR) 100 MG tablet Take 1 tablet (100 mg total) by mouth daily. 90 tablet 0  . Multiple Vitamins-Minerals (VITEYES AREDS ADVANCED) CAPS Take 1 capsule by mouth daily.    Marland Kitchen omeprazole (PRILOSEC) 40 MG capsule Take 40 mg by mouth daily.    Vladimir Faster Glycol-Propyl Glycol (SYSTANE) 0.4-0.3 % SOLN Apply 1 drop to eye daily as needed (dry  eyes).     . polyethylene glycol (MIRALAX / GLYCOLAX) packet Take 17 g by mouth daily as needed for moderate constipation. 30 each 0  . Potassium Chloride ER 20 MEQ TBCR Take 20 mEq by mouth daily. 30 tablet 0  . sennosides-docusate sodium (SENOKOT-S) 8.6-50 MG tablet Take 1-2 tablets by mouth 3 (three) times daily.    . urea (CARMOL) 10 % cream Apply topically as needed. 71 g 0  . diltiazem (CARDIZEM CD) 240 MG 24 hr capsule Take 1 capsule (240 mg total) by mouth daily. 90 capsule 1  . ondansetron (ZOFRAN ODT) 4 MG disintegrating tablet Take 1 tablet (4 mg total) by mouth every 8 (eight) hours as needed for nausea or vomiting. (Patient not taking: Reported on 04/20/2018) 30 tablet 0  . oxyCODONE (OXY IR/ROXICODONE) 5 MG immediate release tablet Take 1 tablet (5 mg total) by mouth every 6 (six) hours as needed for moderate pain or severe pain. (Patient not taking: Reported on 04/20/2018) 90 tablet 0  . prochlorperazine (COMPAZINE) 10 MG tablet Take 1 tablet (10 mg total) by mouth every 6 (six) hours as needed for nausea or vomiting. (Patient not taking: Reported on 04/20/2018) 30 tablet 0   Current Facility-Administered Medications  Medication Dose Route Frequency Provider Last Rate Last Dose  . sodium chloride flush (NS) 0.9 % injection 3 mL  3 mL Intravenous PRN Skeet Latch, MD        PHYSICAL EXAMINATION: ECOG PERFORMANCE STATUS: 1   Vitals:   04/20/18 1054  BP: 132/61  Pulse: 75  Resp: 14  Temp: 97.8 F (36.6 C)  SpO2: 99%   Filed Weights   04/20/18 1054  Weight: 173 lb 3.2 oz (78.6 kg)    GENERAL:alert, no distress and comfortable SKIN: skin color, texture, turgor are normal, no rashes or significant lesions EYES:  sclera clear OROPHARYNX:no thrush or ulcers  LYMPH:  no palpable cervical or supraclavicular lymphadenopathy  LUNGS: clear to auscultation with normal breathing effort HEART: regular rate & rhythm, no lower extremity edema ABDOMEN:abdomen soft, non-tender and  normal bowel sounds Musculoskeletal:no cyanosis of digits and no clubbing  NEURO: alert & oriented x 3 with fluent speech, no focal motor or sensory deficits per tuning fork exam   LABORATORY DATA:  I have reviewed the data as listed CBC Latest Ref Rng & Units 04/20/2018 04/06/2018 03/23/2018  WBC 3.9 - 10.3 K/uL 10.5(H) 7.4 8.8  Hemoglobin 11.6 - 15.9 g/dL 11.4(L) 11.3(L) 11.4(L)  Hematocrit 34.8 - 46.6 % 36.0 34.6(L) 36.0  Platelets 145 - 400 K/uL 381 399 350     CMP Latest Ref Rng & Units 04/20/2018 04/06/2018 03/23/2018  Glucose 70 - 99 mg/dL 86 107(H) 86  BUN 8 - 23 mg/dL _0 Creatinine 0.44 - 1.00 mg/dL 0.89 0.94 0.84  Sodium 135 - 145 mmol/L 142 140 138  Potassium 3.5 -  5.1 mmol/L 3.8 3.9 3.9  Chloride 98 - 111 mmol/L 107 106 104  CO2 22 - 32 mmol/L _0 Calcium 8.9 - 10.3 mg/dL 9.6 9.3 9.3  Total Protein 6.5 - 8.1 g/dL 7.4 7.2 7.8  Total Bilirubin 0.3 - 1.2 mg/dL 0.4 0.3 0.5  Alkaline Phos 38 - 126 U/L 88 95 115  AST 15 - 41 U/L 14(L) 13(L) 16  ALT 0 - 44 U/L _1 PATHOLOGY: [facility] 11/11/16   Diagnosis 11/11/16 Liver, needle/core biopsy, left lobe - METASTATIC ADENOCARCINOMA. - SEE COMMENT. Microscopic Comment The histologic features are consistent with a primary gastrointestinal adenocarcinoma. Dr. Vicente Males has reviewed the case and concurs with this interpretation. There is likely sufficient tumor present for additional studies, if requested. Enid Cutter MD Pathologist, Electronic Signature (Case signed 11/12/2016)  Diagnosis 09/04/2016 MODERATELY DIFFERENTIATED ADENOCARCINOMA OF COLON AT 20 CM Tumor Site: Colon at 20 cm Tumor Type: Adenocarcinoma Tumore Size: Unknown  Grade: Moderately differentiated    RADIOGRAPHIC STUDIES: I have personally reviewed the radiological images as listed and agreed with the findings in the report. No results found.   ASSESSMENT & PLAN: Mary [name] is a 84 y.o. female   1. Adenocarcinoma of left  colon, sigmoid colon, TxNxM1 with liver and lung mets, stage IV, KRAS/NRS wild type, MSI-stable  2. Constipation, abdominal pain  3. Left renal mass and left hydronephrosis s/p left ureter stent placement 10/10/16  4. Complex cyst of left ovary  5. Atrial fibrillation  6. HTN 7. Nutrition  8. Goals of care discussion  9. Partial bowel obstruction s/p sigmoid stent placement 08/11/17 and 12/04/17   Ms. Encinas appears stable. She completed 4 cycles irinotecan and panitumumab with 20% dose reduction in irinotecan with 4th cycle. She tolerated moderately well. She continues to have mild-moderate fatigue and fluctuation in GI function but overall symptoms are stable and well managed. CBC, CMP, Mg are stable and adequate for treatment; CEA remains normal. Proceed with cycle 5 today with current dose-reduction to 112 mg/m2 irinotecan and panitumumab. Will restage after this cycle, CT CAP ordered today.   PLAN -Labs reviewed, proceed with cycle 5 irinotecan 112 mg/m2 and panitumumab -Restage after this cycle, CT CAP ordered today -Return in 2 weeks for f/u and next cycle   Orders Placed This Encounter  Procedures  . CT Abdomen Pelvis W Contrast    Standing Status:   Future    Standing Expiration Date:   04/20/2019    Order Specific Question:   If indicated for the ordered procedure, I authorize the administration of contrast media per Radiology protocol    Answer:   Yes    Order Specific Question:   Preferred imaging location?    Answer:   [facility]    Order Specific Question:   Is Oral Contrast requested for this exam?    Answer:   Yes, Per Radiology protocol    Order Specific Question:   Radiology Contrast Protocol - do NOT remove file path    Answer:   \\[email or web address]  . CT Chest W Contrast    Standing Status:   Future    Standing Expiration Date:   04/20/2019    Order Specific Question:   If indicated for the ordered procedure, I authorize the  administration of contrast media per Radiology protocol    Answer:   Yes    Order Specific Question:   Preferred imaging location?    Answer:  [facility]    Order Specific Question:   Radiology Contrast Protocol - do NOT remove file path    Answer:   \\[email or web address]    All questions were answered. The patient knows to call the clinic with any problems, questions or concerns. No barriers to learning was detected.     Alla Feeling, NP 04/20/18

## 2018-04-20 NOTE — Patient Instructions (Signed)
[facility] Discharge Instructions for Patients Receiving Chemotherapy  Today you received the following chemotherapy agents: Vectibix, Irinotecan  To help prevent nausea and vomiting after your treatment, we encourage you to take your nausea medication as directed.   If you develop nausea and vomiting that is not controlled by your nausea medication, call the clinic.   BELOW ARE SYMPTOMS THAT SHOULD BE REPORTED IMMEDIATELY:  *FEVER GREATER THAN 100.5 F  *CHILLS WITH OR WITHOUT FEVER  NAUSEA AND VOMITING THAT IS NOT CONTROLLED WITH YOUR NAUSEA MEDICATION  *UNUSUAL SHORTNESS OF BREATH  *UNUSUAL BRUISING OR BLEEDING  TENDERNESS IN MOUTH AND THROAT WITH OR WITHOUT PRESENCE OF ULCERS  *URINARY PROBLEMS  *BOWEL PROBLEMS  UNUSUAL RASH Items with * indicate a potential emergency and should be followed up as soon as possible.  Feel free to call the clinic should you have any questions or concerns. The clinic phone number is [phone].  Please show the CHEMO ALERT CARD at check-in to the Emergency Department and triage nurse.   

## 2018-04-21 ENCOUNTER — Telehealth: Payer: Self-pay | Admitting: Hematology

## 2018-04-21 NOTE — Telephone Encounter (Signed)
Spoke with daughter in law - she is aware of appt added for 10/7 per 9/9 los.

## 2018-04-25 ENCOUNTER — Other Ambulatory Visit: Payer: Self-pay | Admitting: Hematology

## 2018-04-30 ENCOUNTER — Ambulatory Visit (HOSPITAL_COMMUNITY)
Admission: RE | Admit: 2018-04-30 | Discharge: 2018-04-30 | Disposition: A | Discharge status: Home / Self Care | Payer: Medicare Other | Source: Ambulatory Visit | Attending: Nurse Practitioner | Admitting: Nurse Practitioner

## 2018-04-30 DIAGNOSIS — C7800 Secondary malignant neoplasm of unspecified lung: Secondary | ICD-10-CM | POA: Diagnosis not present

## 2018-04-30 DIAGNOSIS — C20 Malignant neoplasm of rectum: Secondary | ICD-10-CM | POA: Diagnosis not present

## 2018-04-30 DIAGNOSIS — R590 Localized enlarged lymph nodes: Secondary | ICD-10-CM | POA: Diagnosis not present

## 2018-04-30 DIAGNOSIS — N289 Disorder of kidney and ureter, unspecified: Secondary | ICD-10-CM | POA: Insufficient documentation

## 2018-04-30 DIAGNOSIS — C801 Malignant (primary) neoplasm, unspecified: Secondary | ICD-10-CM | POA: Insufficient documentation

## 2018-04-30 DIAGNOSIS — D490 Neoplasm of unspecified behavior of digestive system: Secondary | ICD-10-CM | POA: Diagnosis not present

## 2018-04-30 DIAGNOSIS — C787 Secondary malignant neoplasm of liver and intrahepatic bile duct: Secondary | ICD-10-CM | POA: Insufficient documentation

## 2018-04-30 MED ORDER — IOHEXOL 300 MG/ML  SOLN
100.0000 mL | Freq: Once | INTRAMUSCULAR | Status: AC | PRN
  Administered 2018-04-30: 100 mL via INTRAVENOUS

## 2018-05-03 NOTE — Progress Notes (Signed)
[place]  Telephone:([phone]) [phone] Fax:(336) [phone]  Clinic Follow up Note   Patient Care Team: Moshe Cipro, MD as PCP - General (Internal Medicine) Alexis Frock, MD as Consulting Physician (Urology) Michael Boston, MD as Consulting Physician (General Surgery) Toma Deiters, MD as Referring Physician (Gastroenterology) 05/04/2018  SUMMARY OF ONCOLOGIC HISTORY: Oncology History   Cancer Staging Cancer of left colon [facility]) Staging form: Colon and Rectum, AJCC 8th Edition - Clinical stage from 09/04/2016: Stage IVB (cTX, cNX, pM1b) - Signed by Truitt Merle, MD on 11/12/2016       Cancer of sigmoid colon ([name])   09/02/2016 Procedure    Colonoscopy Diverticulosis (scattered). Pt has about 10 cm structure starting at 20 cm with abnormal mucosa. Biopsies taken.     09/02/2016 Pathology Results    MODERATELY DIFFERENTIATED ADENOCARCINOMA OF COLON AT 20 CM    09/04/2016 Initial Diagnosis    Cancer of left colon ([name])    09/06/2016 Imaging    CT CAP w Contrast 1. No acute abdominopelvic process.  2. Heterogenous hypodense area involving the medial edge of the left kidney with multiple small soft tissue like stranding involving the left pararenal space. Findings are suspicious for malignancy of the left kidney with small pararenal metastases. MRI abdomen with contrast should be considered to further evaluate.  3. Vague hypodensity involving the lateral segment of the liver. This can also be reevaluated with MRI.  4. Partially visualized 5 mm posterior right lower lobe nodular density. CT chest without contrast follow up is recommended.  5. Complex multilobular left ovarian lesion may reflect a cluster of cysts.    10/10/2016 Procedure    Operative Report by Dr. Tresa Moore on 10/18/16 Procedure: 1) Cystoscopy, left retrograde pyelogram interpretation. 2) Left diagnostic uteroscopy. 3) Insertion of left uteral stent; 5 x 24 Polaris with tether. Findings: 1) Unremarkable  bladder. 2) Unremarkable left retrograde pyelogram. 3) No evidence of intraluminal urothelial neoplasm whatsoever with insepction of the left kidney and ureter times several. 4) Successful placement of left ureteral stent, proximal in renal pelvis and distal in urinary bladder.    10/15/2016 Imaging    MRI abdomen pelvis 1. There are 2 enhancing hepatic lesions highly worrisome for metastatic disease, likely metastatic colon cancer. 2. The lesion of concern in the anterior suprahilar lip of the left kidney is also enhancing, worrisome for neoplasm. This has a somewhat atypical appearance for renal cell carcinoma and could reflect a metastasis as well. 3. New left-sided hydroureter with delayed contrast excretion consistent with distal ureteral obstruction. Specific etiology uncertain. 4. Complex cystic and solid left adnexal lesion could reflect cystic neoplasm, although does not appear aggressive or appear to reflect the cause of the distal left ureteral obstruction. 5. Sigmoid colon wall thickening could be related to reported newly diagnosed colon cancer.    11/06/2016 PET scan    PET 11/06/2016 IMPRESSION: 1. Hypermetabolic pulmonary and hepatic lesions, most consistent with metastatic colon cancer, with the hypermetabolic primary located in the sigmoid colon. 2. Left renal and left adnexal lesions, better seen and evaluated on 10/15/2016. 3. Persistent moderate left hydronephrosis. 4. Aortic atherosclerosis (ICD10-170.0).    11/11/2016 Pathology Results    Liver, needle/core biopsy, left lobe - METASTATIC ADENOCARCINOMA. The histologic features are consistent with a primary gastrointestinal adenocarcinoma.    05/19/2017 Imaging    CT Abdomen Pelvis, 05/19/2017 IMPRESSION: 1. Worsening pulmonary and hepatic metastatic disease. Primary sigmoid lesion is stable to minimally enlarged. 2. Stable to slight enlargement of a left  perinephric mass, likely a metastasis. Renal cell  carcinoma is not excluded. Associated pelvocaliectasis versus mild hydronephrosis. 3. Borderline left periaortic lymph nodes, slightly enlarged. 4. Cholelithiasis. 5.  Aortic atherosclerosis (ICD10-170.0). 6. Cystic and solid lesion in the left adnexa, as on prior exams, likely ovarian in origin.    06/06/2017 - 01/16/2018 Chemotherapy    IV antibody Vectibix every [redacted] weeks along with oral chemo Xeloda for 1 week on and 1 week off starting 06/06/17. Due to skin toxicities we reduced Xeloda to 2000 mg in the AM and 1548m in the PM 1 week on and 1 week off on 10/31/17. She had multiple treatment break due to hospitalization. Restarted treatment on 12/26/17. Chemo break 01/16/18-02/20/18    08/07/2017 - 08/16/2017 Hospital Admission    Admit date: 08/07/17 Admission diagnosis: Bowel Obstruction  Additional comments: She was admitted to the hospital on 08/07/17 after [facility] due to a bowel obstruction.  She had a sigmoid colon stent placement on 08/11/17. Bowel obstruction resolved.  CT scan in hospital showed great partial response to chemotherapy. She will continue chemotherapy given good overall tolerance.      08/07/2017 Imaging    CT AP W Contrast 08/07/17 IMPRESSION: Low colonic obstruction secondary to a rectosigmoid stricture at the site of the patient's prior colonic mass, which is no longer evident on CT. Mild upstream colonic wall thickening raises the possibility of superimposed colitis.  Improving hepatic metastases, as above. Left perirenal metastasis, decreased. Visualized pulmonary metastases at the lung bases are grossly unchanged.  3.5 cm mixed cystic/solid left ovarian mass is mildly decreased.     08/11/2017 Procedure    By Dr. MWatt Climes Flexible Sigmoidoscopy 08/11/17 IMPRESSION:   - Preparation of the colon was fair. - Likely malignant partially obstructing tumor in the recto-sigmoid colon versus necrotic scarring. Injected with contrast  proximal to obstruction to confirm proper position. Prosthesis placed. - Stool in the rectum. - No specimens collected.     10/22/2017 Imaging    CT CAP W Contrast 10/22/17 IMPRESSION: 1. Significant interval improvement and liver metastasis. 2. The left perinephric mass is slightly decreased in size in the interval. 3. No significant interval change in the appearance of multiple pulmonary metastasis. 4. Stable appearance of mediastinal adenopathy. 5. Gallstones 6.  Aortic Atherosclerosis (ICD10-I70.0). 7. Colonic stent remains in place without evidence for bowel obstruction.    12/01/2017 - 12/07/2017 Hospital Admission    Admit date: 12/01/17 Admission diagnosis: Bowel Obstruction and abdominal pain Additional comments: had flexible sigmoidoscopy by Dr. MWatt Climeson 12/04/17. Chemo was held during this time.     12/04/2017 Procedure    Flexible Sigmoidoscopy 12/04/17 IMPRESSION - Preparation of the colon was fair. - Stent in the colon. - Stricture in the distal sigmoid colon. Prosthesis placed. - No specimens collected.    02/18/2018 Imaging    CT CAP W Contrast 02/18/18 IMPRESSION: CT CHEST IMPRESSION 1. Bilateral pulmonary metastasis. Compared to the 12/01/2017 abdominal CT, mixed response to therapy. Compared to the 10/22/2017 chest CT, overall slight progression. 2. Similar thoracic adenopathy. 3.  Aortic Atherosclerosis (ICD10-I70.0). CT ABDOMEN AND PELVIS IMPRESSION 1. Since 12/01/2017, similar hepatic metastatic burden. Left renal/perirenal lesion is similar to minimally decreased in size. 2. Sigmoid colonic stent in place with resolution of colonic obstruction. 3. Similar mild soft tissue fullness within both ovaries.    02/20/2018 -  Chemotherapy    second line Irinotecan and Victibix every 2 weeks on 02/20/2018     2020/04/2918  Imaging    IMPRESSION: -Stable distal colonic stent with eccentric soft tissue thickening on the right. No evidence of bowel  obstruction. -Improving pulmonary and hepatic metastases, with index lesions as above. -Improving thoracic lymphadenopathy. -Stable fullness of the bilateral ovaries. -Stable enhancing lesion in the medial interpolar left kidney.    CURRENT THERAPY:Second line Irinotecan and Vectibix every 2 weeks started on 02/20/2018  INTERVAL HISTORY: Ms. Mary [name] returns for follow up as scheduled. She completed cycle 5 irinotecan and vectibix on 04/20/18. She underwent restaging CT scan. She had increased fatigue with last cycle that she does not feel has adequately recovered. She is still able to complete ADLs and leave the house when desired. She eats and drinks well. Has periodic nausea and cramping with BMs. Does not usually require anti-emetics. Uses laxative to maintain normal BM. She has a few scattered red areas on her arms. Denies recent fever, chills, cough, chest pain, dyspnea, leg edema, new/worsening pain or neuropathy.    MEDICAL HISTORY:  Past Medical History:  Diagnosis Date  . Aneurysm of ascending aorta ([facility]) 12/03/2016   3.7 cm by echo 10/2016  . Colon cancer [facility])    dx via biospy from colonoscopy-- malignant ---  currently having work-up done w/ dr gross (general surgeon) and coordinate surgery w/ urologsit for renal tumor  . Essential hypertension 10/31/2016  . GERD (gastroesophageal reflux disease)   . Glaucoma   . Hiatal hernia   . HOH (hard of hearing)    bilateral even w/ hearing aids  . Hyperlipidemia   . Kidney tumor    left side  . Legally blind in right eye, as defined in [place]    due to macular degeneration  . Macular degeneration   . Nausea    intermittant due to colon mass  . Ovarian cyst   . Persistent atrial fibrillation ([place]) 10/31/2016   on ASA only  . PONV (postoperative nausea and vomiting)   . Wears glasses   . Wears hearing aid    bilateral    SURGICAL HISTORY: Past Surgical History:  Procedure Laterality Date  . BREAST CYST EXCISION  1990    benign  . COLONIC STENT PLACEMENT N/A 12/04/2017   Procedure: COLONIC STENT PLACEMENT;  Surgeon: Clarene Essex, MD;  Location: [facility];  Service: Endoscopy;  Laterality: N/A;  . COLONOSCOPY  09/04/2016  . CYSTOSCOPY WITH RETROGRADE PYELOGRAM, URETEROSCOPY AND STENT PLACEMENT Left 10/10/2016   Procedure: CYSTOSCOPY WITH RETROGRADE PYELOGRAM, DIAGNOSTIC URETEROSCOPY  AND STENT PLACEMENT;  Surgeon: Alexis Frock, MD;  Location: [facility];  Service: Urology;  Laterality: Left;  . ESOPHAGOGASTRODUODENOSCOPY  11/07/2014  . FLEXIBLE SIGMOIDOSCOPY N/A 08/11/2017   Procedure: FLEXIBLE SIGMOIDOSCOPY;  Surgeon: Clarene Essex, MD;  Location: [facility];  Service: Endoscopy;  Laterality: N/A;  With Fluoroscopy for colonic stent placement for colonic stricture  . FLEXIBLE SIGMOIDOSCOPY N/A 12/04/2017   Procedure: FLEXIBLE SIGMOIDOSCOPY;  Surgeon: Clarene Essex, MD;  Location: [facility];  Service: Endoscopy;  Laterality: N/A;  . NASAL SINUS SURGERY    . TONSILLECTOMY      I have reviewed the social history and family history with the patient and they are unchanged from previous note.  ALLERGIES:  is allergic to benazepril; tylenol [acetaminophen]; and bactrim [sulfamethoxazole-trimethoprim].  MEDICATIONS:  Current Outpatient Medications  Medication Sig Dispense Refill  . amLODipine (NORVASC) 5 MG tablet Take 1 tablet (5 mg total) by mouth daily. 90 tablet 1  . Bisacodyl (DULCOLAX PO) Take 2 tablets by mouth 2 (two)  times daily.    . bisacodyl (DULCOLAX) 10 MG suppository Place 10 mg rectally as needed for moderate constipation.    Marland Kitchen diltiazem (CARDIZEM CD) 240 MG 24 hr capsule Take 1 capsule (240 mg total) by mouth daily. 90 capsule 1  . escitalopram (LEXAPRO) 10 MG tablet Take 10 mg by mouth daily.    . fexofenadine (ALLEGRA) 180 MG tablet Take 180 mg by mouth daily.    Marland Kitchen latanoprost (XALATAN) 0.005 % ophthalmic solution Place 1 drop into both eyes at bedtime.    Marland Kitchen losartan (COZAAR)  100 MG tablet Take 1 tablet (100 mg total) by mouth daily. 90 tablet 0  . Multiple Vitamins-Minerals (VITEYES AREDS ADVANCED) CAPS Take 1 capsule by mouth daily.    Marland Kitchen omeprazole (PRILOSEC) 40 MG capsule Take 40 mg by mouth daily.    . ondansetron (ZOFRAN ODT) 4 MG disintegrating tablet Take 1 tablet (4 mg total) by mouth every 8 (eight) hours as needed for nausea or vomiting. 30 tablet 0  . Polyethyl Glycol-Propyl Glycol (SYSTANE) 0.4-0.3 % SOLN Apply 1 drop to eye daily as needed (dry eyes).     . polyethylene glycol (MIRALAX / GLYCOLAX) packet Take 17 g by mouth daily as needed for moderate constipation. 30 each 0  . Potassium Chloride ER 20 MEQ TBCR Take 20 mEq by mouth daily. 30 tablet 0  . sennosides-docusate sodium (SENOKOT-S) 8.6-50 MG tablet Take 1-2 tablets by mouth 3 (three) times daily.    . urea (CARMOL) 10 % cream Apply topically as needed. 71 g 0  . oxyCODONE (OXY IR/ROXICODONE) 5 MG immediate release tablet Take 1 tablet (5 mg total) by mouth every 6 (six) hours as needed for moderate pain or severe pain. (Patient not taking: Reported on 04/20/2018) 90 tablet 0  . prochlorperazine (COMPAZINE) 10 MG tablet Take 1 tablet (10 mg total) by mouth every 6 (six) hours as needed for nausea or vomiting. (Patient not taking: Reported on 04/20/2018) 30 tablet 0   Current Facility-Administered Medications  Medication Dose Route Frequency Provider Last Rate Last Dose  . sodium chloride flush (NS) 0.9 % injection 3 mL  3 mL Intravenous PRN Skeet Latch, MD        PHYSICAL EXAMINATION: ECOG PERFORMANCE STATUS: 1-2  Vitals:   05/04/18 1116  BP: (!) 147/73  Pulse: 77  Resp: 18  Temp: 98.4 F (36.9 C)  SpO2: 99%   Filed Weights   05/04/18 1116  Weight: 173 lb 6.4 oz (78.7 kg)    GENERAL:alert, no distress and comfortable SKIN: skin color, texture, turgor are normal, scattered small irregular erythematous areas to arms bilaterally  EYES:  sclera clear OROPHARYNX:no thrush or ulcers   LYMPH:  no palpable cervical, supraclavicular, or axillary lymphadenopathy LUNGS: clear to auscultation with normal breathing effort HEART: regular rate & rhythm, no lower extremity edema ABDOMEN:abdomen soft, non-tender and normal bowel sounds Musculoskeletal:no cyanosis of digits and no clubbing  NEURO: alert & oriented x 3 with fluent speech, no focal motor/sensory deficits  LABORATORY DATA:  I have reviewed the data as listed CBC Latest Ref Rng & Units 05/04/2018 04/20/2018 04/06/2018  WBC 3.9 - 10.3 K/uL 10.4(H) 10.5(H) 7.4  Hemoglobin 11.6 - 15.9 g/dL 10.7(L) 11.4(L) 11.3(L)  Hematocrit 34.8 - 46.6 % 33.9(L) 36.0 34.6(L)  Platelets 145 - 400 K/uL 354 381 399     CMP Latest Ref Rng & Units 05/04/2018 04/20/2018 04/06/2018  Glucose 70 - 99 mg/dL 92 86 107(H)  BUN 8 - 23 mg/dL  10 11 14   Creatinine 0.44 - 1.00 mg/dL 0.78 0.89 0.94  Sodium 135 - 145 mmol/L 141 142 140  Potassium 3.5 - 5.1 mmol/L 3.7 3.8 3.9  Chloride 98 - 111 mmol/L 107 107 106  CO2 22 - 32 mmol/L 26 24 25   Calcium 8.9 - 10.3 mg/dL 9.2 9.6 9.3  Total Protein 6.5 - 8.1 g/dL 7.0 7.4 7.2  Total Bilirubin 0.3 - 1.2 mg/dL 0.4 0.4 0.3  Alkaline Phos 38 - 126 U/L 82 88 95  AST 15 - 41 U/L 14(L) 14(L) 13(L)  ALT 0 - 44 U/L 14 16 15       RADIOGRAPHIC STUDIES: I have personally reviewed the radiological images as listed and agreed with the findings in the report. No results found.   ASSESSMENT & PLAN: Brailey Buescher is a84 y.o.female   1. Adenocarcinoma of left colon, sigmoid colon, TxNxM1 with liver and lung mets, stage IV, KRAS/NRS wild type, MSI-stable  2. Constipation, abdominal pain  3. Left renal mass and left hydronephrosis s/p left ureter stent placement 10/10/16; stable left renal mass on 9/20 CT without evidence of hydronephrosis 4. Complex cyst of left ovary CT 9/20 shows stable fullness of bilateral ovaries 5. Atrial fibrillation  6. HTN 7. Nutrition  8. Goals of care discussion  9. Partial bowel  obstruction s/p sigmoid stent placement 08/11/17 and 12/04/17 - stable on 9/20 CT with no evidence of obstruction  Ms. Col[place] appears stable. She completed 5 cycles irinotecan and panitumumab, she tolerated moderately well except worsening fatigue. She underwent restaging CT which I reviewed with the patient and family today. Ct shows improving pulmonary and hepatic metastases and thoracic lymphadenopathy, stable colonic stent without evidence of bowel obstruction, and stable left renal mass without hydronephrosis. No evidence of new or progressive disease. I reviewed this shows a good response to therapy. However, given her progressive fatigue, she requests 6 week treatment break to enjoy this time of year. I support her wishes. I reviewed that without chemotherapy her cancer may grow, she understands.   CBC and CMP are stable. Will cancel today and subsequent treatments. Plan to see her back in 6 weeks for f/u and to resume treatment. I will review imaging with Dr. Burr Medico when she returns to the office and will discuss with patient if MD recommends changes to the current plan.   PLAN: -Labs and imaging reviewed -Patient desires 6 weeks treatment break, will cancel today's treatment -Lab, f/u with Dr. Burr Medico, irinotecan/vectibix planned in 6 weeks -Will review restaging CT with Dr. Burr Medico upon her return, if MD has additional recommendations will contact the patient and schedule accordingly.   All questions were answered. The patient knows to call the clinic with any problems, questions or concerns. No barriers to learning was detected. I spent 20 minutes counseling the patient face to face. The total time spent in the appointment was  and more than 50% was on counseling and review of test results     Alla Feeling, NP 05/04/18

## 2018-05-04 ENCOUNTER — Inpatient Hospital Stay: Payer: Medicare Other

## 2018-05-04 ENCOUNTER — Other Ambulatory Visit: Payer: Medicare Other

## 2018-05-04 ENCOUNTER — Encounter: Payer: Self-pay | Admitting: Nurse Practitioner

## 2018-05-04 ENCOUNTER — Ambulatory Visit: Payer: Medicare Other | Admitting: Nurse Practitioner

## 2018-05-04 ENCOUNTER — Telehealth: Payer: Self-pay | Admitting: Hematology

## 2018-05-04 ENCOUNTER — Inpatient Hospital Stay (HOSPITAL_BASED_OUTPATIENT_CLINIC_OR_DEPARTMENT_OTHER): Payer: Medicare Other | Admitting: Nurse Practitioner

## 2018-05-04 ENCOUNTER — Ambulatory Visit: Payer: Medicare Other

## 2018-05-04 VITALS — BP 147/73 | HR 77 | Temp 98.4°F | Resp 18 | Ht 66.0 in | Wt 173.4 lb

## 2018-05-04 DIAGNOSIS — C187 Malignant neoplasm of sigmoid colon: Secondary | ICD-10-CM

## 2018-05-04 DIAGNOSIS — C787 Secondary malignant neoplasm of liver and intrahepatic bile duct: Secondary | ICD-10-CM | POA: Diagnosis not present

## 2018-05-04 DIAGNOSIS — C186 Malignant neoplasm of descending colon: Secondary | ICD-10-CM

## 2018-05-04 DIAGNOSIS — C7801 Secondary malignant neoplasm of right lung: Secondary | ICD-10-CM | POA: Diagnosis not present

## 2018-05-04 DIAGNOSIS — C7802 Secondary malignant neoplasm of left lung: Secondary | ICD-10-CM

## 2018-05-04 DIAGNOSIS — Z7189 Other specified counseling: Secondary | ICD-10-CM | POA: Diagnosis not present

## 2018-05-04 DIAGNOSIS — Z5111 Encounter for antineoplastic chemotherapy: Secondary | ICD-10-CM | POA: Diagnosis not present

## 2018-05-04 DIAGNOSIS — Z5112 Encounter for antineoplastic immunotherapy: Secondary | ICD-10-CM | POA: Diagnosis not present

## 2018-05-04 DIAGNOSIS — I10 Essential (primary) hypertension: Secondary | ICD-10-CM | POA: Diagnosis not present

## 2018-05-04 DIAGNOSIS — N289 Disorder of kidney and ureter, unspecified: Secondary | ICD-10-CM | POA: Diagnosis not present

## 2018-05-04 DIAGNOSIS — I4891 Unspecified atrial fibrillation: Secondary | ICD-10-CM

## 2018-05-04 LAB — CBC WITH DIFFERENTIAL (CANCER CENTER ONLY)
BASOS ABS: 0 10*3/uL (ref 0.0–0.1)
BASOS PCT: 0 %
EOS ABS: 0.4 10*3/uL (ref 0.0–0.5)
Eosinophils Relative: 4 %
HEMATOCRIT: 33.9 % — AB (ref 34.8–46.6)
HEMOGLOBIN: 10.7 g/dL — AB (ref 11.6–15.9)
Lymphocytes Relative: 20 %
Lymphs Abs: 2 10*3/uL (ref 0.9–3.3)
MCH: 28.2 pg (ref 25.1–34.0)
MCHC: 31.6 g/dL (ref 31.5–36.0)
MCV: 89.2 fL (ref 79.5–101.0)
MONO ABS: 1.5 10*3/uL — AB (ref 0.1–0.9)
Monocytes Relative: 15 %
NEUTROS PCT: 61 %
Neutro Abs: 6.4 10*3/uL (ref 1.5–6.5)
Platelet Count: 354 10*3/uL (ref 145–400)
RBC: 3.8 MIL/uL (ref 3.70–5.45)
RDW: 16.5 % — AB (ref 11.2–14.5)
WBC Count: 10.4 10*3/uL — ABNORMAL HIGH (ref 3.9–10.3)

## 2018-05-04 LAB — CMP (CANCER CENTER ONLY)
ALK PHOS: 82 U/L (ref 38–126)
ALT: 14 U/L (ref 0–44)
AST: 14 U/L — ABNORMAL LOW (ref 15–41)
Albumin: 3.3 g/dL — ABNORMAL LOW (ref 3.5–5.0)
Anion gap: 8 (ref 5–15)
BILIRUBIN TOTAL: 0.4 mg/dL (ref 0.3–1.2)
BUN: 10 mg/dL (ref 8–23)
CALCIUM: 9.2 mg/dL (ref 8.9–10.3)
CO2: 26 mmol/L (ref 22–32)
Chloride: 107 mmol/L (ref 98–111)
Creatinine: 0.78 mg/dL (ref 0.44–1.00)
GFR, Estimated: >60 mL/min (ref >60)
Glucose, Bld: 92 mg/dL (ref 70–99)
Potassium: 3.7 mmol/L (ref 3.5–5.1)
Sodium: 141 mmol/L (ref 135–145)
TOTAL PROTEIN: 7 g/dL (ref 6.5–8.1)

## 2018-05-04 LAB — MAGNESIUM: Magnesium: 1.8 mg/dL (ref 1.7–2.4)

## 2018-05-04 NOTE — Telephone Encounter (Signed)
Appts scheduled AVS/Calendar printed per 9/23 los °

## 2018-05-08 ENCOUNTER — Telehealth: Payer: Self-pay | Admitting: Nurse Practitioner

## 2018-05-08 NOTE — Telephone Encounter (Signed)
Called patient and spoke to her daughter in law to inform her that I reviewed CT with Dr. Burr Medico. MD proposed to continue vectibix only, either q2 weeks or q3 week during patient's desired 6-week treatment break. Mary [name] will discuss with Mary [name] and notify us if she wishes to schedule an infusion.

## 2018-05-12 ENCOUNTER — Telehealth: Payer: Self-pay | Admitting: *Deleted

## 2018-05-12 DIAGNOSIS — C189 Malignant neoplasm of colon, unspecified: Secondary | ICD-10-CM | POA: Diagnosis not present

## 2018-05-12 DIAGNOSIS — E559 Vitamin D deficiency, unspecified: Secondary | ICD-10-CM | POA: Diagnosis not present

## 2018-05-12 DIAGNOSIS — I482 Chronic atrial fibrillation, unspecified: Secondary | ICD-10-CM | POA: Diagnosis not present

## 2018-05-12 DIAGNOSIS — D6481 Anemia due to antineoplastic chemotherapy: Secondary | ICD-10-CM | POA: Diagnosis not present

## 2018-05-12 DIAGNOSIS — Z0000 Encounter for general adult medical examination without abnormal findings: Secondary | ICD-10-CM | POA: Diagnosis not present

## 2018-05-12 DIAGNOSIS — I10 Essential (primary) hypertension: Secondary | ICD-10-CM | POA: Diagnosis not present

## 2018-05-12 DIAGNOSIS — T451X5A Adverse effect of antineoplastic and immunosuppressive drugs, initial encounter: Secondary | ICD-10-CM | POA: Diagnosis not present

## 2018-05-12 NOTE — Telephone Encounter (Signed)
Received call from [facility] stating pt has decided to take Vectibix only in 3 wks.  Informed Cira Rue NP & will send schedule message.

## 2018-05-13 ENCOUNTER — Telehealth: Payer: Self-pay | Admitting: Hematology

## 2018-05-13 NOTE — Telephone Encounter (Signed)
Scheduled appt per 10/1 sch message - pt is aware of appt date and time

## 2018-05-18 ENCOUNTER — Other Ambulatory Visit: Payer: Medicare Other

## 2018-05-18 ENCOUNTER — Ambulatory Visit: Payer: Medicare Other

## 2018-05-18 ENCOUNTER — Ambulatory Visit: Payer: Medicare Other | Admitting: Hematology

## 2018-05-25 ENCOUNTER — Inpatient Hospital Stay: Payer: Medicare Other

## 2018-05-25 ENCOUNTER — Other Ambulatory Visit: Payer: Self-pay

## 2018-05-25 ENCOUNTER — Other Ambulatory Visit: Payer: Self-pay | Admitting: Hematology

## 2018-05-25 ENCOUNTER — Telehealth: Payer: Self-pay

## 2018-05-25 ENCOUNTER — Inpatient Hospital Stay: Payer: Medicare Other | Attending: Hematology

## 2018-05-25 VITALS — BP 133/73 | HR 79 | Temp 98.5°F | Resp 17

## 2018-05-25 DIAGNOSIS — C787 Secondary malignant neoplasm of liver and intrahepatic bile duct: Secondary | ICD-10-CM | POA: Diagnosis not present

## 2018-05-25 DIAGNOSIS — C187 Malignant neoplasm of sigmoid colon: Secondary | ICD-10-CM | POA: Diagnosis not present

## 2018-05-25 DIAGNOSIS — C7801 Secondary malignant neoplasm of right lung: Secondary | ICD-10-CM | POA: Diagnosis not present

## 2018-05-25 DIAGNOSIS — Z5112 Encounter for antineoplastic immunotherapy: Secondary | ICD-10-CM | POA: Diagnosis not present

## 2018-05-25 DIAGNOSIS — C7802 Secondary malignant neoplasm of left lung: Secondary | ICD-10-CM | POA: Diagnosis not present

## 2018-05-25 LAB — CBC WITH DIFFERENTIAL (CANCER CENTER ONLY)
ABS IMMATURE GRANULOCYTES: 0.06 10*3/uL (ref 0.00–0.07)
Basophils Absolute: 0.1 10*3/uL (ref 0.0–0.1)
Basophils Relative: 0 %
EOS PCT: 2 %
Eosinophils Absolute: 0.2 10*3/uL (ref 0.0–0.5)
HCT: 35.9 % — ABNORMAL LOW (ref 36.0–46.0)
HEMOGLOBIN: 11 g/dL — AB (ref 12.0–15.0)
Immature Granulocytes: 0 %
LYMPHS PCT: 18 %
Lymphs Abs: 2.4 10*3/uL (ref 0.7–4.0)
MCH: 28.3 pg (ref 26.0–34.0)
MCHC: 30.6 g/dL (ref 30.0–36.0)
MCV: 92.3 fL (ref 80.0–100.0)
MONO ABS: 1.4 10*3/uL — AB (ref 0.1–1.0)
Monocytes Relative: 10 %
NEUTROS ABS: 9.5 10*3/uL — AB (ref 1.7–7.7)
Neutrophils Relative %: 70 %
Platelet Count: 398 10*3/uL (ref 150–400)
RBC: 3.89 MIL/uL (ref 3.87–5.11)
RDW: 17.1 % — ABNORMAL HIGH (ref 11.5–15.5)
WBC Count: 13.7 10*3/uL — ABNORMAL HIGH (ref 4.0–10.5)
nRBC: 0 % (ref 0.0–0.2)

## 2018-05-25 LAB — MAGNESIUM: Magnesium: 2.1 mg/dL (ref 1.7–2.4)

## 2018-05-25 LAB — COMPREHENSIVE METABOLIC PANEL
ALT: 10 U/L (ref 0–44)
AST: 17 U/L (ref 15–41)
Albumin: 3.5 g/dL (ref 3.5–5.0)
Alkaline Phosphatase: 69 U/L (ref 38–126)
Anion gap: 11 (ref 5–15)
BUN: 12 mg/dL (ref 8–23)
CHLORIDE: 101 mmol/L (ref 98–111)
CO2: 25 mmol/L (ref 22–32)
Calcium: 9.2 mg/dL (ref 8.9–10.3)
Creatinine, Ser: 0.88 mg/dL (ref 0.44–1.00)
GFR calc Af Amer: >60 mL/min (ref >60)
GFR calc non Af Amer: 60 mL/min — ABNORMAL LOW (ref >60)
GLUCOSE: 82 mg/dL (ref 70–99)
POTASSIUM: 4 mmol/L (ref 3.5–5.1)
Sodium: 137 mmol/L (ref 135–145)
Total Bilirubin: 0.5 mg/dL (ref 0.3–1.2)
Total Protein: 7.5 g/dL (ref 6.5–8.1)

## 2018-05-25 MED ORDER — PALONOSETRON HCL INJECTION 0.25 MG/5ML
0.2500 mg | Freq: Once | INTRAVENOUS | Status: AC
  Administered 2018-05-25: 0.25 mg via INTRAVENOUS

## 2018-05-25 MED ORDER — SODIUM CHLORIDE 0.9 % IV SOLN
Freq: Once | INTRAVENOUS | Status: AC
  Administered 2018-05-25: 13:00:00 via INTRAVENOUS
  Filled 2018-05-25: qty 250

## 2018-05-25 MED ORDER — PALONOSETRON HCL INJECTION 0.25 MG/5ML
INTRAVENOUS | Status: AC
  Filled 2018-05-25: qty 5

## 2018-05-25 MED ORDER — IRINOTECAN HCL CHEMO INJECTION 100 MG/5ML
112.0000 mg/m2 | Freq: Once | INTRAVENOUS | Status: DC
  Filled 2018-05-25: qty 11

## 2018-05-25 MED ORDER — ATROPINE SULFATE 1 MG/ML IJ SOLN: 0.5000 mg | Freq: Once | INTRAMUSCULAR | Status: DC | PRN

## 2018-05-25 MED ORDER — ATROPINE SULFATE 1 MG/ML IJ SOLN
INTRAMUSCULAR | Status: AC
  Filled 2018-05-25: qty 1

## 2018-05-25 MED ORDER — DEXAMETHASONE SODIUM PHOSPHATE 10 MG/ML IJ SOLN
10.0000 mg | Freq: Once | INTRAMUSCULAR | Status: AC
  Administered 2018-05-25: 10 mg via INTRAVENOUS

## 2018-05-25 MED ORDER — DEXAMETHASONE SODIUM PHOSPHATE 10 MG/ML IJ SOLN
INTRAMUSCULAR | Status: AC
  Filled 2018-05-25: qty 1

## 2018-05-25 MED ORDER — SODIUM CHLORIDE 0.9 % IV SOLN
6.5000 mg/kg | Freq: Once | INTRAVENOUS | Status: AC
  Administered 2018-05-25: 500 mg via INTRAVENOUS
  Filled 2018-05-25: qty 20

## 2018-05-25 NOTE — Telephone Encounter (Signed)
Per Dr. Burr Medico okay to treat without CMP results as long as patient is eating and drinking, feeling okay.

## 2018-05-25 NOTE — Progress Notes (Signed)
Patient states that she is eating and drinking and does feel okay.  Patient and daughter made this RN aware that the patient was not supposed to receive the Irinotecan after the medications were released and received from pharmacy. Followed up with Cira Rue NP and confirmed that the patient will not receive the Irinotecan and will only receive the Vectibix today. Patient and daughter made aware and the Irinotecan was returned to pharmacy.

## 2018-05-25 NOTE — Telephone Encounter (Signed)
I spoke with NP Lacie and she held Irinotecan today. Pt received Vectibix only today.  Pt will return in 3 weeks for Irinotecan and Vectibix. I will see her on her next visit.   Truitt Merle MD

## 2018-05-25 NOTE — Telephone Encounter (Signed)
[facility] in [facility]fusion called stating the the Irinotecan was put on hold for 6 weeks.  Patient's family states she is supposed to get a scan prior to resuming.  Had scan in September.

## 2018-05-25 NOTE — Patient Instructions (Signed)
[facility] Discharge Instructions for Patients Receiving Chemotherapy  Today you received the following chemotherapy agents: Vectibix.  To help prevent nausea and vomiting after your treatment, we encourage you to take your nausea medication as directed.   If you develop nausea and vomiting that is not controlled by your nausea medication, call the clinic.   BELOW ARE SYMPTOMS THAT SHOULD BE REPORTED IMMEDIATELY:  *FEVER GREATER THAN 100.5 F  *CHILLS WITH OR WITHOUT FEVER  NAUSEA AND VOMITING THAT IS NOT CONTROLLED WITH YOUR NAUSEA MEDICATION  *UNUSUAL SHORTNESS OF BREATH  *UNUSUAL BRUISING OR BLEEDING  TENDERNESS IN MOUTH AND THROAT WITH OR WITHOUT PRESENCE OF ULCERS  *URINARY PROBLEMS  *BOWEL PROBLEMS  UNUSUAL RASH Items with * indicate a potential emergency and should be followed up as soon as possible.  Feel free to call the clinic should you have any questions or concerns. The clinic phone number is [phone].  Please show the CHEMO ALERT CARD at check-in to the Emergency Department and triage nurse.   

## 2018-05-26 ENCOUNTER — Encounter: Payer: Self-pay | Admitting: Nurse Practitioner

## 2018-05-26 LAB — CEA (IN HOUSE-CHCC): CEA (CHCC-IN HOUSE): 3.52 ng/mL (ref 0.00–5.00)

## 2018-05-26 NOTE — Progress Notes (Signed)
Patient was last seen by me 05/04/18 to review restaging CT that showed stable disease on irinotecan/vectibix. Patient requested 6 week treatment break. After further discussion with Dr. Burr Medico and subsequent telephone call to patient's family, Mary [name] agreed to receive vectibix only in the interim of her break. She informed the desk RN and a schedule message was sent. The patient came on 10/14, received vectibix only. She will return again 11/5 for lab, f/u with Dr. Burr Medico with plan to resume irinotecan/vectibix.

## 2018-05-27 DIAGNOSIS — Z85038 Personal history of other malignant neoplasm of large intestine: Secondary | ICD-10-CM | POA: Diagnosis not present

## 2018-05-27 DIAGNOSIS — H401134 Primary open-angle glaucoma, bilateral, indeterminate stage: Secondary | ICD-10-CM | POA: Diagnosis not present

## 2018-05-27 DIAGNOSIS — H353213 Exudative age-related macular degeneration, right eye, with inactive scar: Secondary | ICD-10-CM | POA: Diagnosis not present

## 2018-05-27 DIAGNOSIS — H2513 Age-related nuclear cataract, bilateral: Secondary | ICD-10-CM | POA: Diagnosis not present

## 2018-06-08 DIAGNOSIS — Z23 Encounter for immunization: Secondary | ICD-10-CM | POA: Diagnosis not present

## 2018-06-15 ENCOUNTER — Ambulatory Visit: Payer: Medicare Other

## 2018-06-15 ENCOUNTER — Ambulatory Visit: Payer: Medicare Other | Admitting: Nurse Practitioner

## 2018-06-15 ENCOUNTER — Other Ambulatory Visit: Payer: Medicare Other

## 2018-06-16 ENCOUNTER — Inpatient Hospital Stay: Payer: Medicare Other

## 2018-06-16 ENCOUNTER — Encounter: Payer: Self-pay | Admitting: Hematology

## 2018-06-16 ENCOUNTER — Telehealth: Payer: Self-pay | Admitting: Hematology

## 2018-06-16 ENCOUNTER — Ambulatory Visit (HOSPITAL_COMMUNITY)
Admission: RE | Admit: 2018-06-16 | Discharge: 2018-06-16 | Disposition: A | Discharge status: Home / Self Care | Payer: Medicare Other | Source: Ambulatory Visit | Attending: Hematology | Admitting: Hematology

## 2018-06-16 ENCOUNTER — Inpatient Hospital Stay: Payer: Medicare Other | Attending: Hematology | Admitting: Hematology

## 2018-06-16 VITALS — BP 134/70 | HR 83 | Temp 98.6°F | Resp 18 | Ht 66.0 in | Wt 178.6 lb

## 2018-06-16 DIAGNOSIS — R110 Nausea: Secondary | ICD-10-CM | POA: Diagnosis not present

## 2018-06-16 DIAGNOSIS — C186 Malignant neoplasm of descending colon: Secondary | ICD-10-CM

## 2018-06-16 DIAGNOSIS — I10 Essential (primary) hypertension: Secondary | ICD-10-CM | POA: Diagnosis not present

## 2018-06-16 DIAGNOSIS — I4891 Unspecified atrial fibrillation: Secondary | ICD-10-CM

## 2018-06-16 DIAGNOSIS — C187 Malignant neoplasm of sigmoid colon: Secondary | ICD-10-CM | POA: Insufficient documentation

## 2018-06-16 DIAGNOSIS — C787 Secondary malignant neoplasm of liver and intrahepatic bile duct: Secondary | ICD-10-CM | POA: Diagnosis not present

## 2018-06-16 DIAGNOSIS — Z5111 Encounter for antineoplastic chemotherapy: Secondary | ICD-10-CM | POA: Diagnosis not present

## 2018-06-16 DIAGNOSIS — Z7189 Other specified counseling: Secondary | ICD-10-CM

## 2018-06-16 DIAGNOSIS — Z7982 Long term (current) use of aspirin: Secondary | ICD-10-CM

## 2018-06-16 DIAGNOSIS — R109 Unspecified abdominal pain: Secondary | ICD-10-CM | POA: Diagnosis not present

## 2018-06-16 DIAGNOSIS — Z85038 Personal history of other malignant neoplasm of large intestine: Secondary | ICD-10-CM | POA: Diagnosis not present

## 2018-06-16 DIAGNOSIS — I700 Atherosclerosis of aorta: Secondary | ICD-10-CM | POA: Insufficient documentation

## 2018-06-16 DIAGNOSIS — R630 Anorexia: Secondary | ICD-10-CM | POA: Diagnosis not present

## 2018-06-16 DIAGNOSIS — Z79899 Other long term (current) drug therapy: Secondary | ICD-10-CM | POA: Diagnosis not present

## 2018-06-16 DIAGNOSIS — C7902 Secondary malignant neoplasm of left kidney and renal pelvis: Secondary | ICD-10-CM | POA: Insufficient documentation

## 2018-06-16 DIAGNOSIS — Z5112 Encounter for antineoplastic immunotherapy: Secondary | ICD-10-CM | POA: Insufficient documentation

## 2018-06-16 DIAGNOSIS — C7800 Secondary malignant neoplasm of unspecified lung: Secondary | ICD-10-CM | POA: Insufficient documentation

## 2018-06-16 LAB — CMP (CANCER CENTER ONLY)
ALBUMIN: 3.3 g/dL — AB (ref 3.5–5.0)
ALK PHOS: 87 U/L (ref 38–126)
ALT: 13 U/L (ref 0–44)
ANION GAP: 6 (ref 5–15)
AST: 15 U/L (ref 15–41)
BUN: 10 mg/dL (ref 8–23)
CHLORIDE: 105 mmol/L (ref 98–111)
CO2: 26 mmol/L (ref 22–32)
Calcium: 9.5 mg/dL (ref 8.9–10.3)
Creatinine: 0.94 mg/dL (ref 0.44–1.00)
GFR, Est AFR Am: >60 mL/min (ref >60)
GFR, Estimated: 55 mL/min — ABNORMAL LOW (ref >60)
GLUCOSE: 83 mg/dL (ref 70–99)
POTASSIUM: 3.9 mmol/L (ref 3.5–5.1)
Sodium: 137 mmol/L (ref 135–145)
Total Bilirubin: 0.4 mg/dL (ref 0.3–1.2)
Total Protein: 7.8 g/dL (ref 6.5–8.1)

## 2018-06-16 LAB — CBC WITH DIFFERENTIAL (CANCER CENTER ONLY)
ABS IMMATURE GRANULOCYTES: 0.03 10*3/uL (ref 0.00–0.07)
BASOS ABS: 0.1 10*3/uL (ref 0.0–0.1)
Basophils Relative: 1 %
Eosinophils Absolute: 0.2 10*3/uL (ref 0.0–0.5)
Eosinophils Relative: 1 %
HEMATOCRIT: 37.7 % (ref 36.0–46.0)
Hemoglobin: 11.6 g/dL — ABNORMAL LOW (ref 12.0–15.0)
IMMATURE GRANULOCYTES: 0 %
LYMPHS ABS: 2.1 10*3/uL (ref 0.7–4.0)
LYMPHS PCT: 18 %
MCH: 28.2 pg (ref 26.0–34.0)
MCHC: 30.8 g/dL (ref 30.0–36.0)
MCV: 91.5 fL (ref 80.0–100.0)
MONOS PCT: 13 %
Monocytes Absolute: 1.6 10*3/uL — ABNORMAL HIGH (ref 0.1–1.0)
NEUTROS ABS: 8.1 10*3/uL — AB (ref 1.7–7.7)
NEUTROS PCT: 67 %
PLATELETS: 453 10*3/uL — AB (ref 150–400)
RBC: 4.12 MIL/uL (ref 3.87–5.11)
RDW: 16.1 % — ABNORMAL HIGH (ref 11.5–15.5)
WBC Count: 12.1 10*3/uL — ABNORMAL HIGH (ref 4.0–10.5)
nRBC: 0 % (ref 0.0–0.2)

## 2018-06-16 LAB — MAGNESIUM: Magnesium: 2.1 mg/dL (ref 1.7–2.4)

## 2018-06-16 MED ORDER — SODIUM CHLORIDE 0.9 % IV SOLN
6.5000 mg/kg | Freq: Once | INTRAVENOUS | Status: AC
  Administered 2018-06-16: 500 mg via INTRAVENOUS
  Filled 2018-06-16: qty 20

## 2018-06-16 MED ORDER — SODIUM CHLORIDE 0.9 % IJ SOLN
INTRAMUSCULAR | Status: AC
  Filled 2018-06-16: qty 50

## 2018-06-16 MED ORDER — IOHEXOL 300 MG/ML  SOLN
100.0000 mL | Freq: Once | INTRAMUSCULAR | Status: AC | PRN
  Administered 2018-06-16: 100 mL via INTRAVENOUS

## 2018-06-16 MED ORDER — SODIUM CHLORIDE 0.9 % IV SOLN
Freq: Once | INTRAVENOUS | Status: AC
  Administered 2018-06-16: 13:00:00 via INTRAVENOUS
  Filled 2018-06-16: qty 250

## 2018-06-16 MED ORDER — OXYCODONE HCL 5 MG PO TABS: 5.0000 mg | ORAL_TABLET | Freq: Four times a day (QID) | ORAL | 0 refills | Status: DC | PRN

## 2018-06-16 MED ORDER — ONDANSETRON 4 MG PO TBDP: 4.0000 mg | ORAL_TABLET | Freq: Three times a day (TID) | ORAL | 1 refills | Status: DC | PRN

## 2018-06-16 NOTE — Patient Instructions (Signed)
[facility] Discharge Instructions for Patients Receiving Chemotherapy  Today you received the following chemotherapy agents vectibix  To help prevent nausea and vomiting after your treatment, we encourage you to take your nausea medication as directed   If you develop nausea and vomiting that is not controlled by your nausea medication, call the clinic.   BELOW ARE SYMPTOMS THAT SHOULD BE REPORTED IMMEDIATELY:  *FEVER GREATER THAN 100.5 F  *CHILLS WITH OR WITHOUT FEVER  NAUSEA AND VOMITING THAT IS NOT CONTROLLED WITH YOUR NAUSEA MEDICATION  *UNUSUAL SHORTNESS OF BREATH  *UNUSUAL BRUISING OR BLEEDING  TENDERNESS IN MOUTH AND THROAT WITH OR WITHOUT PRESENCE OF ULCERS  *URINARY PROBLEMS  *BOWEL PROBLEMS  UNUSUAL RASH Items with * indicate a potential emergency and should be followed up as soon as possible.  Feel free to call the clinic should you have any questions or concerns. The clinic phone number is [phone].  Please show the [facility] at check-in to the Emergency Department and triage nurse.

## 2018-06-16 NOTE — Progress Notes (Signed)
[place]  [phone] [phone]  Clinic Follow Up Note   Patient Care Team: Moshe Cipro, MD as PCP - General (Internal Medicine) Alexis Frock, MD as Consulting Physician (Urology) Michael Boston, MD as Consulting Physician (General Surgery) Toma Deiters, MD as Referring Physician (Gastroenterology)      Date of Service:  06/16/2018  CHIEF COMPLAINTS:  Follow up Metastatic sigmoid colon Adenocarcinoma  Oncology History   Cancer Staging Cancer of left colon [facility]) Staging form: Colon and Rectum, AJCC 8th Edition - Clinical stage from 09/04/2016: Stage IVB (cTX, cNX, pM1b) - Signed by Truitt Merle, MD on 11/12/2016       Cancer of sigmoid colon ([place])   09/02/2016 Procedure    Colonoscopy Diverticulosis (scattered). Pt has about 10 cm structure starting at 20 cm with abnormal mucosa. Biopsies taken.     09/02/2016 Pathology Results    MODERATELY DIFFERENTIATED ADENOCARCINOMA OF COLON AT 20 CM    09/04/2016 Initial Diagnosis    Cancer of left colon ([place])    09/06/2016 Imaging    CT CAP w Contrast 1. No acute abdominopelvic process.  2. Heterogenous hypodense area involving the medial edge of the left kidney with multiple small soft tissue like stranding involving the left pararenal space. Findings are suspicious for malignancy of the left kidney with small pararenal metastases. MRI abdomen with contrast should be considered to further evaluate.  3. Vague hypodensity involving the lateral segment of the liver. This can also be reevaluated with MRI.  4. Partially visualized 5 mm posterior right lower lobe nodular density. CT chest without contrast follow up is recommended.  5. Complex multilobular left ovarian lesion may reflect a cluster of cysts.    10/10/2016 Procedure    Operative Report by Dr. Tresa Moore on 10/18/16 Procedure: 1) Cystoscopy, left retrograde pyelogram interpretation. 2) Left diagnostic uteroscopy. 3) Insertion of left uteral  stent; 5 x 24 Polaris with tether. Findings: 1) Unremarkable bladder. 2) Unremarkable left retrograde pyelogram. 3) No evidence of intraluminal urothelial neoplasm whatsoever with insepction of the left kidney and ureter times several. 4) Successful placement of left ureteral stent, proximal in renal pelvis and distal in urinary bladder.    10/15/2016 Imaging    MRI abdomen pelvis 1. There are 2 enhancing hepatic lesions highly worrisome for metastatic disease, likely metastatic colon cancer. 2. The lesion of concern in the anterior suprahilar lip of the left kidney is also enhancing, worrisome for neoplasm. This has a somewhat atypical appearance for renal cell carcinoma and could reflect a metastasis as well. 3. New left-sided hydroureter with delayed contrast excretion consistent with distal ureteral obstruction. Specific etiology uncertain. 4. Complex cystic and solid left adnexal lesion could reflect cystic neoplasm, although does not appear aggressive or appear to reflect the cause of the distal left ureteral obstruction. 5. Sigmoid colon wall thickening could be related to reported newly diagnosed colon cancer.    11/06/2016 PET scan    PET 11/06/2016 IMPRESSION: 1. Hypermetabolic pulmonary and hepatic lesions, most consistent with metastatic colon cancer, with the hypermetabolic primary located in the sigmoid colon. 2. Left renal and left adnexal lesions, better seen and evaluated on 10/15/2016. 3. Persistent moderate left hydronephrosis. 4. Aortic atherosclerosis (ICD10-170.0).    11/11/2016 Pathology Results    Liver, needle/core biopsy, left lobe - METASTATIC ADENOCARCINOMA. The histologic features are consistent with a primary gastrointestinal adenocarcinoma.    05/19/2017 Imaging    CT Abdomen Pelvis, 05/19/2017 IMPRESSION: 1. Worsening pulmonary and hepatic metastatic disease. Primary  sigmoid lesion is stable to minimally enlarged. 2. Stable to slight enlargement  of a left perinephric mass, likely a metastasis. Renal cell carcinoma is not excluded. Associated pelvocaliectasis versus mild hydronephrosis. 3. Borderline left periaortic lymph nodes, slightly enlarged. 4. Cholelithiasis. 5.  Aortic atherosclerosis (ICD10-170.0). 6. Cystic and solid lesion in the left adnexa, as on prior exams, likely ovarian in origin.    06/06/2017 - 01/16/2018 Chemotherapy    IV antibody Vectibix every [redacted] weeks along with oral chemo Xeloda for 1 week on and 1 week off starting 06/06/17. Due to skin toxicities we reduced Xeloda to 2000 mg in the AM and 1530m in the PM 1 week on and 1 week off on 10/31/17. She had multiple treatment break due to hospitalization. Restarted treatment on 12/26/17. Chemo break 01/16/18-02/20/18    08/07/2017 - 08/16/2017 Hospital Admission    Admit date: 08/07/17 Admission diagnosis: Bowel Obstruction  Additional comments: She was admitted to the hospital on 08/07/17 after [facility] due to a bowel obstruction.  She had a sigmoid colon stent placement on 08/11/17. Bowel obstruction resolved.  CT scan in hospital showed great partial response to chemotherapy. She will continue chemotherapy given good overall tolerance.      08/07/2017 Imaging    CT AP W Contrast 08/07/17 IMPRESSION: Low colonic obstruction secondary to a rectosigmoid stricture at the site of the patient's prior colonic mass, which is no longer evident on CT. Mild upstream colonic wall thickening raises the possibility of superimposed colitis.  Improving hepatic metastases, as above. Left perirenal metastasis, decreased. Visualized pulmonary metastases at the lung bases are grossly unchanged.  3.5 cm mixed cystic/solid left ovarian mass is mildly decreased.     08/11/2017 Procedure    By Dr. MWatt Climes Flexible Sigmoidoscopy 08/11/17 IMPRESSION:   - Preparation of the colon was fair. - Likely malignant partially obstructing tumor in the  recto-sigmoid colon versus necrotic scarring. Injected with contrast proximal to obstruction to confirm proper position. Prosthesis placed. - Stool in the rectum. - No specimens collected.     10/22/2017 Imaging    CT CAP W Contrast 10/22/17 IMPRESSION: 1. Significant interval improvement and liver metastasis. 2. The left perinephric mass is slightly decreased in size in the interval. 3. No significant interval change in the appearance of multiple pulmonary metastasis. 4. Stable appearance of mediastinal adenopathy. 5. Gallstones 6.  Aortic Atherosclerosis (ICD10-I70.0). 7. Colonic stent remains in place without evidence for bowel obstruction.    12/01/2017 - 12/07/2017 Hospital Admission    Admit date: 12/01/17 Admission diagnosis: Bowel Obstruction and abdominal pain Additional comments: had flexible sigmoidoscopy by Dr. MWatt Climeson 12/04/17. Chemo was held during this time.     12/04/2017 Procedure    Flexible Sigmoidoscopy 12/04/17 IMPRESSION - Preparation of the colon was fair. - Stent in the colon. - Stricture in the distal sigmoid colon. Prosthesis placed. - No specimens collected.    02/18/2018 Imaging    CT CAP W Contrast 02/18/18 IMPRESSION: CT CHEST IMPRESSION 1. Bilateral pulmonary metastasis. Compared to the 12/01/2017 abdominal CT, mixed response to therapy. Compared to the 10/22/2017 chest CT, overall slight progression. 2. Similar thoracic adenopathy. 3.  Aortic Atherosclerosis (ICD10-I70.0). CT ABDOMEN AND PELVIS IMPRESSION 1. Since 12/01/2017, similar hepatic metastatic burden. Left renal/perirenal lesion is similar to minimally decreased in size. 2. Sigmoid colonic stent in place with resolution of colonic obstruction. 3. Similar mild soft tissue fullness within both ovaries.    02/20/2018 -  Chemotherapy  second line Irinotecan and Victibix every 2 weeks on 02/20/2018     2020/01/218 Imaging    IMPRESSION: -Stable distal colonic stent with eccentric soft  tissue thickening on the right. No evidence of bowel obstruction. -Improving pulmonary and hepatic metastases, with index lesions as above. -Improving thoracic lymphadenopathy. -Stable fullness of the bilateral ovaries. -Stable enhancing lesion in the medial interpolar left kidney.     HISTORY OF PRESENTING ILLNESS (10/25/2016):  Mary [name] 84 y.o. female is here because of adenocarcinoma of the colon and a left renal mass. The pt noticed worsening episodes of abdominal cramping and nausea with bowel movements in July 2017. She had an abdominal US performed, which was unremarkable. A colonoscopy was then performed at the patient's request, which found a mass about 20 cm from the anal verge. Biopsy showed moderately differentiated adenocarcinoma. MRI done, with a lesion noticed in the left kidney near the pelvis, a 11 mm hepatic lesion, 4 mm lung nodule, and possible cystic left ovarian mass. Pt saw Dr. Johney Maine for information on abdominal surgery for her colon, which she declined. She also saw Dr. Lorna Dibble, who is considering a nephrectomy, but is planning a sternoscopy to better characterize the kidney lesion. She presents today for continued treatment.    She presents today with her son and daughter-in-law. The symptoms started in late July 2017. She had some vomiting, abdominal pain, and constipation, but not bleeding. Her PCP never found any anemia. Initially she lost her appetite, but now she is taking multiple vitamins and supplements, so her appetite is back. Her bowel has improved, but she still has abdominal pain that comes and goes, lasting minutes. The pain is tolerable with aleve. She did not tolerate Tramadol well; nausea and vomiting. She has some occasional left sides back pain. Denies weight loss, or any other concerns. She is able to do everything she needs to around the house.   She had a partial hysterectomy, and a lumpectomy. She has a history of HTN, atrial fibrillation, high  cholesterol. She has some hearing loss and can not see out of her right eye. Not a diabetic, but her son and her mother are. No family history of colon cancer. Her sister had breast cancer in her 17's and her brother had lung cancer in his 28's. Never smoker. Doesn't drink alcohol. 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 She has two sons; one lives an hour away, and the other lives in [place]. She lives alone.   CURRENT THERAPY:  Second line Irinotecan and Vectibix every 2 weeks started on 02/20/2018, chemo break since 04/20/2018  INTERVAL HISTORY:   Mary [name] is here for a follow up.  She was last seen by nurse practitioner Bella Kennedy on May 04, 2018.  Due to the fatigue from chemotherapy, she decided to take a chemo break since her last treatment on April 20, 2018.  She has noticed worsening abdominal bloating and cramps and constipation in the past a few weeks, she has tried MiraLAX, and Senokot, and last bowel movement was 2 days ago.  Her abdominal pain is intermittent, she takes oxycodone.  She also has mild intermittent nausea, no vomiting.  No fever or chills.  Her appetite is also low, weight is stable.   MEDICAL HISTORY:  Past Medical History:  Diagnosis Date  . Aneurysm of ascending aorta ([facility]) 12/03/2016   3.7 cm by echo 10/2016  . Colon cancer ([place])    dx via biospy from colonoscopy-- malignant ---  currently having  work-up done w/ dr gross (general surgeon) and coordinate surgery w/ urologsit for renal tumor  . Essential hypertension 10/31/2016  . GERD (gastroesophageal reflux disease)   . Glaucoma   . Hiatal hernia   . HOH (hard of hearing)    bilateral even w/ hearing aids  . Hyperlipidemia   . Kidney tumor    left side  . Legally blind in right eye, as defined in [place]    due to macular degeneration  . Macular degeneration   . Nausea    intermittant due to colon mass  . Ovarian cyst   . Persistent atrial fibrillation 10/31/2016   on ASA only  . PONV (postoperative  nausea and vomiting)   . Wears glasses   . Wears hearing aid    bilateral    SURGICAL HISTORY: Past Surgical History:  Procedure Laterality Date  . BREAST CYST EXCISION  1990   benign  . COLONIC STENT PLACEMENT N/A 12/04/2017   Procedure: COLONIC STENT PLACEMENT;  Surgeon: Clarene Essex, MD;  Location: [facility];  Service: Endoscopy;  Laterality: N/A;  . COLONOSCOPY  09/04/2016  . CYSTOSCOPY WITH RETROGRADE PYELOGRAM, URETEROSCOPY AND STENT PLACEMENT Left 10/10/2016   Procedure: CYSTOSCOPY WITH RETROGRADE PYELOGRAM, DIAGNOSTIC URETEROSCOPY  AND STENT PLACEMENT;  Surgeon: Alexis Frock, MD;  Location: [facility];  Service: Urology;  Laterality: Left;  . ESOPHAGOGASTRODUODENOSCOPY  11/07/2014  . FLEXIBLE SIGMOIDOSCOPY N/A 08/11/2017   Procedure: FLEXIBLE SIGMOIDOSCOPY;  Surgeon: Clarene Essex, MD;  Location: [facility];  Service: Endoscopy;  Laterality: N/A;  With Fluoroscopy for colonic stent placement for colonic stricture  . FLEXIBLE SIGMOIDOSCOPY N/A 12/04/2017   Procedure: FLEXIBLE SIGMOIDOSCOPY;  Surgeon: Clarene Essex, MD;  Location: [facility];  Service: Endoscopy;  Laterality: N/A;  . NASAL SINUS SURGERY    . TONSILLECTOMY      SOCIAL HISTORY: Social History   Socioeconomic History  . Marital status: Widowed    Spouse name: Not on file  . Number of children: Not on file  . Years of education: Not on file  . Highest education level: Not on file  Occupational History  . Not on file  Social Needs  . Financial resource strain: Not on file  . Food insecurity:    Worry: Not on file    Inability: Not on file  . Transportation needs:    Medical: Not on file    Non-medical: Not on file  Tobacco Use  . Smoking status: Never Smoker  . Smokeless tobacco: Never Used  Substance and Sexual Activity  . Alcohol use: No  . Drug use: No  . Sexual activity: Not on file  Lifestyle  . Physical activity:    Days per week: Not on file    Minutes per session: Not on  file  . Stress: Not on file  Relationships  . Social connections:    Talks on phone: Not on file    Gets together: Not on file    Attends religious service: Not on file    Active member of club or organization: Not on file    Attends meetings of clubs or organizations: Not on file    Relationship status: Not on file  . Intimate partner violence:    Fear of current or ex partner: Not on file    Emotionally abused: Not on file    Physically abused: Not on file    Forced sexual activity: Not on file  Other Topics Concern  . Not on file  Social  History Narrative  . Not on file    FAMILY HISTORY: Family History  Problem Relation Age of Onset  . Cancer Sister 56       breast cancer   . Cancer Brother 20       lung cancer   . Diabetes Son   . High blood pressure Son   . Peripheral Artery Disease Mother   . High blood pressure Mother     ALLERGIES:  is allergic to benazepril; tylenol [acetaminophen]; and bactrim [sulfamethoxazole-trimethoprim].  MEDICATIONS:  Current Outpatient Medications  Medication Sig Dispense Refill  . amLODipine (NORVASC) 5 MG tablet Take 1 tablet (5 mg total) by mouth daily. 90 tablet 1  . Bisacodyl (DULCOLAX PO) Take 2 tablets by mouth 2 (two) times daily.    . bisacodyl (DULCOLAX) 10 MG suppository Place 10 mg rectally as needed for moderate constipation.    Marland Kitchen diltiazem (CARDIZEM CD) 240 MG 24 hr capsule Take 1 capsule (240 mg total) by mouth daily. 90 capsule 1  . escitalopram (LEXAPRO) 10 MG tablet Take 10 mg by mouth daily.    . fexofenadine (ALLEGRA) 180 MG tablet Take 180 mg by mouth daily.    Marland Kitchen latanoprost (XALATAN) 0.005 % ophthalmic solution Place 1 drop into both eyes at bedtime.    Marland Kitchen losartan (COZAAR) 100 MG tablet Take 1 tablet (100 mg total) by mouth daily. 90 tablet 0  . Multiple Vitamins-Minerals (VITEYES AREDS ADVANCED) CAPS Take 1 capsule by mouth daily.    Marland Kitchen omeprazole (PRILOSEC) 40 MG capsule Take 40 mg by mouth daily.    .  ondansetron (ZOFRAN ODT) 4 MG disintegrating tablet Take 1 tablet (4 mg total) by mouth every 8 (eight) hours as needed for nausea or vomiting. 90 tablet 1  . oxyCODONE (OXY IR/ROXICODONE) 5 MG immediate release tablet Take 1 tablet (5 mg total) by mouth every 6 (six) hours as needed for moderate pain or severe pain. 90 tablet 0  . Polyethyl Glycol-Propyl Glycol (SYSTANE) 0.4-0.3 % SOLN Apply 1 drop to eye daily as needed (dry eyes).     . polyethylene glycol (MIRALAX / GLYCOLAX) packet Take 17 g by mouth daily as needed for moderate constipation. 30 each 0  . Potassium Chloride ER 20 MEQ TBCR Take 20 mEq by mouth daily. 30 tablet 0  . sennosides-docusate sodium (SENOKOT-S) 8.6-50 MG tablet Take 1-2 tablets by mouth 3 (three) times daily.    . urea (CARMOL) 10 % cream Apply topically as needed. 71 g 0  . prochlorperazine (COMPAZINE) 10 MG tablet Take 1 tablet (10 mg total) by mouth every 6 (six) hours as needed for nausea or vomiting. (Patient not taking: Reported on 04/20/2018) 30 tablet 0   Current Facility-Administered Medications  Medication Dose Route Frequency Provider Last Rate Last Dose  . sodium chloride flush (NS) 0.9 % injection 3 mL  3 mL Intravenous PRN Skeet Latch, MD       Facility-Administered Medications Ordered in Other Visits  Medication Dose Route Frequency Provider Last Rate Last Dose  . sodium chloride 0.9 % injection             REVIEW OF SYSTEMS:   Constitutional: Denies fevers, chills or abnormal (+) fatigue  Eyes: Denies blurriness of vision, double vision or watery eyes Ears, nose, mouth, throat, and face: Denies mucositis or sore throat (+) oral ulcer Respiratory: Denies cough, dyspnea or wheezes Cardiovascular: Denies palpitation, chest discomfort  Gastrointestinal: see HPI  Skin: Denies rashes  Lymphatics: Denies new  lymphadenopathy or easy bruising MSK: negative Neurological:Denies numbness, tingling or new weaknesses Behavioral/Psych: Mood is stable,  no new changes  All other systems were reviewed with the patient and are negative.  PHYSICAL EXAMINATION:  ECOG PERFORMANCE STATUS: 1 - Symptomatic but completely ambulatory BP 134/70 (BP Location: Left Arm, Patient Position: Sitting)   Pulse 83   Temp 98.6 F (37 C) (Oral)   Resp 18   Ht 5' 6"  (1.676 m)   Wt 178 lb 9.6 oz (81 kg)   SpO2 99%   BMI 28.83 kg/m   GENERAL:alert, no distress and comfortable SKIN: skin color, texture, turgor are normal  EYES: normal, conjunctiva are pink and non-injected, sclera clear OROPHARYNX:no exudate, no erythema and lips, buccal mucosa, and tongue normal  NECK: supple, thyroid normal size, non-tender, without nodularity LYMPH:  no palpable lymphadenopathy in the cervical, axillary or inguinal LUNGS: clear to auscultation and percussion with normal breathing effort HEART: regular rate & rhythm and no murmurs and   ABDOMEN:abdomen soft, hyperactive bowel sound, mild diffuse tenderness, especially at LLQ and right side of abdomen, no rebound pain or ascites  Musculoskeletal:no cyanosis of digits and no clubbing  PSYCH: alert & oriented x 3 with fluent speech NEURO: no focal motor/sensory deficits   LABORATORY DATA:  I have reviewed the data as listed CBC Latest Ref Rng & Units 06/16/2018 05/25/2018 05/04/2018  WBC 4.0 - 10.5 K/uL 12.1(H) 13.7(H) 10.4(H)  Hemoglobin 12.0 - 15.0 g/dL 11.6(L) 11.0(L) 10.7(L)  Hematocrit 36.0 - 46.0 % 37.7 35.9(L) 33.9(L)  Platelets 150 - 400 K/uL 453(H) 398 354   CMP Latest Ref Rng & Units 06/16/2018 05/25/2018 05/04/2018  Glucose 70 - 99 mg/dL 83 82 92  BUN 8 - 23 mg/dL 10 12 10   Creatinine 0.44 - 1.00 mg/dL 0.94 0.88 0.78  Sodium 135 - 145 mmol/L 137 137 141  Potassium 3.5 - 5.1 mmol/L 3.9 4.0 3.7  Chloride 98 - 111 mmol/L 105 101 107  CO2 22 - 32 mmol/L 26 25 26   Calcium 8.9 - 10.3 mg/dL 9.5 9.2 9.2  Total Protein 6.5 - 8.1 g/dL 7.8 7.5 7.0  Total Bilirubin 0.3 - 1.2 mg/dL 0.4 0.5 0.4  Alkaline Phos 38 - 126  U/L 87 69 82  AST 15 - 41 U/L 15 17 14(L)  ALT 0 - 44 U/L 13 10 14    PATHOLOGY: [facility] 11/11/16   Diagnosis 11/11/16 Liver, needle/core biopsy, left lobe - METASTATIC ADENOCARCINOMA. - SEE COMMENT. Microscopic Comment The histologic features are consistent with a primary gastrointestinal adenocarcinoma. Dr. Vicente Males has reviewed the case and concurs with this interpretation. There is likely sufficient tumor present for additional studies, if requested. Enid Cutter MD Pathologist, Electronic Signature (Case signed 11/12/2016)  Diagnosis 09/04/2016 MODERATELY DIFFERENTIATED ADENOCARCINOMA OF COLON AT 20 CM Tumor Site: Colon at 20 cm Tumor Type: Adenocarcinoma Tumore Size: Unknown  Grade: Moderately differentiated   RADIOGRAPHIC STUDIES: I have personally reviewed the radiological images as listed and agreed with the findings in the report.   CT CAP W Contrast 02/18/18 IMPRESSION: CT CHEST IMPRESSION 1. Bilateral pulmonary metastasis. Compared to the 12/01/2017 abdominal CT, mixed response to therapy. Compared to the 10/22/2017 chest CT, overall slight progression. 2. Similar thoracic adenopathy. 3.  Aortic Atherosclerosis (ICD10-I70.0). CT ABDOMEN AND PELVIS IMPRESSION 1. Since 12/01/2017, similar hepatic metastatic burden. Left renal/perirenal lesion is similar to minimally decreased in size. 2. Sigmoid colonic stent in place with resolution of colonic obstruction. 3. Similar mild soft tissue fullness within  both ovaries.   DG Abdomen 11/28/17 IMPRESSION: Stent within the sigmoid colon. Gaseous distention of bowel above the stent concerning for a degree of bowel obstruction.  CT CAP W Contrast 10/22/17 IMPRESSION: 1. Significant interval improvement and liver metastasis. 2. The left perinephric mass is slightly decreased in size in the interval. 3. No significant interval change in the appearance of multiple pulmonary metastasis. 4. Stable appearance of  mediastinal adenopathy. 5. Gallstones 6.  Aortic Atherosclerosis (ICD10-I70.0). 7. Colonic stent remains in place without evidence for bowel obstruction.  CT AP W Contrast 08/07/17 IMPRESSION: Low colonic obstruction secondary to a rectosigmoid stricture at the site of the patient's prior colonic mass, which is no longer evident on CT. Mild upstream colonic wall thickening raises the possibility of superimposed colitis.Improving hepatic metastases, as above. Left perirenal metastasis, decreased. Visualized pulmonary metastases at the lung bases are grossly unchanged. 3.5 cm mixed cystic/solid left ovarian mass is mildly decreased.  CT Chest 06/06/17  IMPRESSION: 1. There are multiple new and enlarging pulmonary nodules throughout the lungs bilaterally. Additionally, a few of the nodules have decreased in size when compared to prior. 2. Interval increase in size of precarinal lymph node. 3. Interval increase in size of hepatic lesions. 4. Aortic Atherosclerosis (ICD10-I70.0).  CT Abdomen Pelvis, 05/19/2017 IMPRESSION: 1. Worsening pulmonary and hepatic metastatic disease. Primary sigmoid lesion is stable to minimally enlarged. 2. Stable to slight enlargement of a left perinephric mass, likely a metastasis. Renal cell carcinoma is not excluded. Associated pelvocaliectasis versus mild hydronephrosis. 3. Borderline left periaortic lymph nodes, slightly enlarged. 4. Cholelithiasis. 5.  Aortic atherosclerosis (ICD10-170.0). 6. Cystic and solid lesion in the left adnexa, as on prior exams, likely ovarian in origin.  PET 11/06/2016 IMPRESSION: 1. Hypermetabolic pulmonary and hepatic lesions, most consistent with metastatic colon cancer, with the hypermetabolic primary located in the sigmoid colon. 2. Left renal and left adnexal lesions, better seen and evaluated on 10/15/2016. 3. Persistent moderate left hydronephrosis. 4. Aortic atherosclerosis (ICD10-170.0).  MRI Abdomen Pelvis w/wo  Contrast 10/15/2016 IMPRESSION: 1. There are 2 enhancing hepatic lesions highly worrisome for metastatic disease, likely metastatic colon cancer. 2. The lesion of concern in the anterior suprahilar lip of the left kidney is also enhancing, worrisome for neoplasm. This has a somewhat atypical appearance for renal cell carcinoma and could reflect a metastasis as well. 3. New left-sided hydroureter with delayed contrast excretion consistent with distal ureteral obstruction. Specific etiology uncertain. 4. Complex cystic and solid left adnexal lesion could reflect cystic neoplasm, although does not appear aggressive or appear to reflect the cause of the distal left ureteral obstruction. 5. Sigmoid colon wall thickening could be related to reported newly diagnosed colon cancer.  CT CAP w Contrast 09/06/2016 (done outside) IMPRESSION: 1. No acute abdominopelvic process.  2. Heterogenous hypodense area involving the medial edge of the left kidney with multiple small soft tissue like stranding involving the left pararenal space. Findings are suspicious for malignancy of the left kidney with small pararenal metastases. MRI abdomen with contrast should be considered to further evaluate.  3. Vague hypodensity involving the lateral segment of the liver. This can also be reevaluated with MRI.  4. Partially visualized 5 mm posterior right lower lobe nodular density. CT chest without contrast follow up is recommended.  5. Complex multilobular left ovarian lesion may reflect a cluster of cysts.   PROCEDURES  Flexible Sigmoidoscopy 12/04/17 IMPRESSION - Preparation of the colon was fair. - Stent in the colon. - Stricture in the distal sigmoid colon.  Prosthesis placed. - No specimens collected.   Flexible Sigmoidoscopy 08/11/17 IMPRESSION:   - Preparation of the colon was fair. - Likely malignant partially obstructing tumor in the recto-sigmoid colon versus necrotic scarring. Injected with contrast  proximal to obstruction to confirm proper position. Prosthesis placed. - Stool in the rectum. - No specimens collected.   Echocardiogram 11/03/16 - Left ventricle: The cavity size was normal. Wall thickness was   increased in a pattern of mild LVH. Indeterminate diastolic   function (atrial fibrillation). Systolic function was normal. The   estimated ejection fraction was in the range of 60% to 65%.   Operative Report by Dr. Tresa Moore on 10/10/16 Procedure: 1) Cystoscopy, left retrograde pyelogram interpretation. 2) Left diagnostic uteroscopy. 3) Insertion of left uteral stent; 5 x 24 Polaris with tether. Findings: 1) Unremarkable bladder. 2) Unremarkable left retrograde pyelogram. 3) No evidence of intraluminal urothelial neoplasm whatsoever with inspection of the left kidney and ureter times several. 4) Successful placement of left ureteral stent, proximal in renal pelvis and distal in urinary bladder.  Colonoscopy 09/02/2016 FINDINGS: Diverticulosis (scattered). Pt has about 10 cm structure starting at 20 cm with abnormal mucosa. The colonoscopy scope was not able to advance due to structure, and upper endoscopy scope was used and it was advanced through the stricture to see ileocecal valve. Biopsies at the stricture were taken.   ASSESSMENT & PLAN:  Mionna is a 84 y.o. female   1. Adenocarcinoma of left colon, sigmoid colon, TxNxM1 with liver and lung mets, stage IV, KRAS/NRS wild type, MSI-stable  -We previously discussed her incurable nature of metastatic colon cancer, and the atypical of therapy. -She had good response to first-line chemotherapy with Xeloda and Panitumumab, tolerating very well overall. -However she developed recurrent bowel obstruction secondary to the sigmoid colon mass, status post colonic stent placement twice by Dr. Watt Climes. -Due to disease progression, her chemotherapy was changed to irinotecan and Panitumumab, she did not tolerated well, mainly with fatigue and  low appetite, chemo dose was reduced.  -He did have good response to taken, her abdominal pain was resolved, no signs of bowel obstruction when she was on chemo. -Started chemo break 2 months ago, and now has developed abdominal pain and nausea, likely partial bowel obstruction again. -will hold on chemo today, and proceed with panitumumab  -will discuss with GI and surgery regarding her bowel obstruction   2. Constipation, abdominal pain  -I previously reviewed laxative use. She will take a daily dose of Miralax up to 3-4 times a day and if she does not have a BM for 3-4 days she will also use Senokot-S. -Pt notes Magnesium citrate caused significant bloating and she does not want to take it again.  -She is currently on oxycodone 3-4 times a day  -Due to recurrent bowel obstructions, on 12/04/17 Dr. Watt Climes did a stent placement.  -I previously recommend she increase her Senakot-S to 2-3 tabs BID and continue Miralax 30 minutes postprandial.  -I also encourage her to continue low residue diet  3. Partial bowel obstruction, s/p sigmoid stent placement on 08/11/17 and again on 12/04/17 -Hospitalized on 08/07/17, and had stent placed on 08/11/17.  -She now had recurrent bowel obstruction as of 11/28/17. She was given lactulose and put on a liquid diet on 11/28/17.  -Without much relief she was admitted to the hospital on 12/01/17 for her bowel obstruction and abdominal pain.  -She had a flexible sigmoidoscopy by Dr. Watt Climes on 12/04/17 and had stent placed.  -If  endoscopy procedure is not able to resolve the obstruction, then surgery with diverting colostomy may be the only option. She is open to that.  -After 2 months chemo break, she has developed signs of bowel obstruction again.  I spoke with Dr. Perley Jain partner Dr. Ines Bloomer who recommend a CT abdomen and pelvis to further evaluate, which I ordered and will be done today   4. Left renal mass and left hydronephrosis -Possible renal cell carcinoma,  metastatic lesion from colon cancer is also a possibility. -She has been seen by [facility], Dr. Tresa Moore, and had left ureter stent placement on 10/10/2016.  -no signs of hydronephrosis on 02/18/18 CT scan, and a left renal mass is likely decreased but overall similar.   5. Complex cyst of left ovary -will monitor. Likely benign -If she undergoes abdominal surgery, may remove it -She declines surgery for now -08/07/17 CT shows mild decrease in size of cyst -02/18/18 CT shows overall stable cyst   6. Atrial Fibrillation -The patient has a history of atrial fibrillation. -Her cardiologist wants her to undergo cardio inversion and anticoagulation. -I previously advised the patient that even if she undergoes cardio inversion, her Afib might return. Also, her anticoagulation medication might make her bleeding worse. -The patient should continue taking baby aspirin.  7. HTN -I previously advised the patient to continue medications and follow up with primary care physician -BP at 128/91 today   8. Nutrition -We previously discussed palliative chemotherapy will make her fatigued, nauseous, and loss of appetite. -I previously stressed the importance of a healthy diet with plenty of protein. -I previously referred to Nutrition.  -She continues to gain weight lately   8.Goal of care discussion  -We previously discussed the incurable nature of her cancer, and the overall poor prognosis, especially if she does not have good response to chemotherapy or progress on chemo -The patient understands the goal of care is palliative. -I previously recommended DNR/DNI, she will think about it     Plan:  -I ordered a stat CT of abdomen and pelvis with contrast to evaluate her bowel obstruction due to the clinical concern -will hold on chemo and proceed with Panitumumab today  -will discuss with Dr. Watt Climes tomorrow, no no endoscopic options, will refer her to surgery  -She knows to go to emergency room if her  abdominal pain and nausea gets worse -I refilled her oxycodone and zofran today    All questions were answered. The patient knows to call the clinic with any problems, questions or concerns. I spent a total of 40 minutes for her visit, more than 50% face-to-face counseling          Truitt Merle, MD 06/16/2018   Addendum  I reviewed her CT result from today, which showed new moderate dilatation of the large bowel upstream to the sigmoid stent, consistent with developing distal large bowel obstruction.  Stable liver and lung metastasis.  I plan to with Dr. Watt Climes tomorrow and possible urgent referral to surgery. Pt was sent home after CT.   Truitt Merle

## 2018-06-16 NOTE — Telephone Encounter (Signed)
Appts scheduled patient notified and calendar/letter mailed per 11/5 los

## 2018-06-18 DIAGNOSIS — C187 Malignant neoplasm of sigmoid colon: Secondary | ICD-10-CM | POA: Diagnosis not present

## 2018-06-18 DIAGNOSIS — R933 Abnormal findings on diagnostic imaging of other parts of digestive tract: Secondary | ICD-10-CM | POA: Diagnosis not present

## 2018-06-19 ENCOUNTER — Other Ambulatory Visit: Payer: Self-pay | Admitting: Gastroenterology

## 2018-06-25 ENCOUNTER — Encounter (HOSPITAL_COMMUNITY)
Admission: RE | Disposition: A | Discharge status: Home / Self Care | Payer: Self-pay | Source: Ambulatory Visit | Attending: Gastroenterology

## 2018-06-25 ENCOUNTER — Ambulatory Visit (HOSPITAL_COMMUNITY): Payer: Medicare Other

## 2018-06-25 ENCOUNTER — Encounter (HOSPITAL_COMMUNITY): Payer: Self-pay | Admitting: *Deleted

## 2018-06-25 ENCOUNTER — Other Ambulatory Visit: Payer: Self-pay

## 2018-06-25 ENCOUNTER — Ambulatory Visit (HOSPITAL_COMMUNITY): Payer: Medicare Other | Admitting: Anesthesiology

## 2018-06-25 ENCOUNTER — Ambulatory Visit (HOSPITAL_COMMUNITY)
Admission: RE | Admit: 2018-06-25 | Discharge: 2018-06-25 | Disposition: A | Discharge status: Home / Self Care | Payer: Medicare Other | Source: Ambulatory Visit | Attending: Gastroenterology | Admitting: Gastroenterology

## 2018-06-25 DIAGNOSIS — K5669 Other partial intestinal obstruction: Secondary | ICD-10-CM | POA: Insufficient documentation

## 2018-06-25 DIAGNOSIS — K219 Gastro-esophageal reflux disease without esophagitis: Secondary | ICD-10-CM | POA: Insufficient documentation

## 2018-06-25 DIAGNOSIS — I119 Hypertensive heart disease without heart failure: Secondary | ICD-10-CM | POA: Diagnosis not present

## 2018-06-25 DIAGNOSIS — Z9689 Presence of other specified functional implants: Secondary | ICD-10-CM | POA: Diagnosis not present

## 2018-06-25 DIAGNOSIS — K449 Diaphragmatic hernia without obstruction or gangrene: Secondary | ICD-10-CM | POA: Insufficient documentation

## 2018-06-25 DIAGNOSIS — Z79899 Other long term (current) drug therapy: Secondary | ICD-10-CM | POA: Insufficient documentation

## 2018-06-25 DIAGNOSIS — Z85528 Personal history of other malignant neoplasm of kidney: Secondary | ICD-10-CM | POA: Diagnosis not present

## 2018-06-25 DIAGNOSIS — I272 Pulmonary hypertension, unspecified: Secondary | ICD-10-CM | POA: Diagnosis not present

## 2018-06-25 DIAGNOSIS — Z886 Allergy status to analgesic agent status: Secondary | ICD-10-CM | POA: Diagnosis not present

## 2018-06-25 DIAGNOSIS — Z85118 Personal history of other malignant neoplasm of bronchus and lung: Secondary | ICD-10-CM | POA: Insufficient documentation

## 2018-06-25 DIAGNOSIS — I4891 Unspecified atrial fibrillation: Secondary | ICD-10-CM | POA: Insufficient documentation

## 2018-06-25 DIAGNOSIS — Z881 Allergy status to other antibiotic agents status: Secondary | ICD-10-CM | POA: Diagnosis not present

## 2018-06-25 DIAGNOSIS — Z9889 Other specified postprocedural states: Secondary | ICD-10-CM | POA: Diagnosis not present

## 2018-06-25 DIAGNOSIS — R933 Abnormal findings on diagnostic imaging of other parts of digestive tract: Secondary | ICD-10-CM | POA: Diagnosis not present

## 2018-06-25 DIAGNOSIS — K56699 Other intestinal obstruction unspecified as to partial versus complete obstruction: Secondary | ICD-10-CM

## 2018-06-25 DIAGNOSIS — K56609 Unspecified intestinal obstruction, unspecified as to partial versus complete obstruction: Secondary | ICD-10-CM | POA: Diagnosis not present

## 2018-06-25 DIAGNOSIS — K5900 Constipation, unspecified: Secondary | ICD-10-CM | POA: Diagnosis not present

## 2018-06-25 DIAGNOSIS — C187 Malignant neoplasm of sigmoid colon: Secondary | ICD-10-CM | POA: Diagnosis not present

## 2018-06-25 DIAGNOSIS — Z79891 Long term (current) use of opiate analgesic: Secondary | ICD-10-CM | POA: Insufficient documentation

## 2018-06-25 HISTORY — PX: COLONIC STENT PLACEMENT: SHX5542

## 2018-06-25 HISTORY — PX: FLEXIBLE SIGMOIDOSCOPY: SHX5431

## 2018-06-25 SURGERY — SIGMOIDOSCOPY, FLEXIBLE
Anesthesia: Monitor Anesthesia Care

## 2018-06-25 MED ORDER — PROPOFOL 10 MG/ML IV BOLUS
INTRAVENOUS | Status: AC
  Filled 2018-06-25: qty 40

## 2018-06-25 MED ORDER — PHENYLEPHRINE 40 MCG/ML (10ML) SYRINGE FOR IV PUSH (FOR BLOOD PRESSURE SUPPORT)
PREFILLED_SYRINGE | INTRAVENOUS | Status: DC | PRN
  Administered 2018-06-25 (×4): 80 ug via INTRAVENOUS

## 2018-06-25 MED ORDER — PROPOFOL 500 MG/50ML IV EMUL
INTRAVENOUS | Status: DC | PRN
  Administered 2018-06-25: 125 ug/kg/min via INTRAVENOUS

## 2018-06-25 MED ORDER — LACTATED RINGERS IV SOLN
INTRAVENOUS | Status: DC
  Administered 2018-06-25: 10:00:00 via INTRAVENOUS

## 2018-06-25 MED ORDER — SODIUM CHLORIDE 0.9 % IV SOLN: INTRAVENOUS | Status: DC

## 2018-06-25 MED ORDER — EPHEDRINE SULFATE-NACL 50-0.9 MG/10ML-% IV SOSY
PREFILLED_SYRINGE | INTRAVENOUS | Status: DC | PRN
  Administered 2018-06-25: 10 mg via INTRAVENOUS

## 2018-06-25 MED ORDER — LIDOCAINE 2% (20 MG/ML) 5 ML SYRINGE
INTRAMUSCULAR | Status: DC | PRN
  Administered 2018-06-25: 100 mg via INTRAVENOUS

## 2018-06-25 MED ORDER — FLEET ENEMA 7-19 GM/118ML RE ENEM
ENEMA | RECTAL | Status: AC
  Filled 2018-06-25: qty 1

## 2018-06-25 MED ORDER — FLEET ENEMA 7-19 GM/118ML RE ENEM
1.0000 | ENEMA | Freq: Once | RECTAL | Status: AC
  Administered 2018-06-25: 1 via RECTAL

## 2018-06-25 NOTE — Transfer of Care (Addendum)
Immediate Anesthesia Transfer of Care Note  Patient: Mary [name]  Procedure(s) Performed: FLEXIBLE SIGMOIDOSCOPY WITH STENT PLACEMENT (N/A ) COLONIC STENT PLACEMENT (N/A )  Patient Location: PACU  Anesthesia Type:MAC  Level of Consciousness: awake, alert  and oriented  Airway & Oxygen Therapy: Patient Spontanous Breathing and Patient connected to face mask oxygen  Post-op Assessment: Report given to RN and Post -op Vital signs reviewed and stable  Post vital signs: Reviewed and stable  Last Vitals:  Vitals Value Taken Time  BP    Temp    Pulse 97 06/25/2018 11:19 AM  Resp 21 06/25/2018 11:19 AM  SpO2 99 % 06/25/2018 11:19 AM  Vitals shown include unvalidated device data.  Last Pain:  Vitals:   06/25/18 0907  TempSrc: Oral  PainSc: 4          Complications: No apparent anesthesia complications

## 2018-06-25 NOTE — Op Note (Signed)
[facility] Patient Name: Mary [name] Procedure Date: 06/25/2018 MRN: [id number] Attending MD: Clarene Essex , MD Date of Birth: 02-Jul-1934 CSN: [id number] Age: 84 Admit Type: Outpatient Procedure:                Flexible Sigmoidoscopy Indications:              Abnormal CT of the GI tract, For therapy of colonic                            obstruction from colon cancer Providers:                Clarene Essex, MD, Cleda Daub, RN, Vista Lawman,                            RN, Charolette Child, Technician, [facility],                            CRNA Referring MD:              Medicines:                Propofol total dose 200 mg IV, 100mg  IV lidocaine Complications:            No immediate complications. Estimated Blood Loss:     Estimated blood loss: none. Procedure:                Pre-Anesthesia Assessment:                           - Prior to the procedure, a History and Physical                            was performed, and patient medications and                            allergies were reviewed. The patient's tolerance of                            previous anesthesia was also reviewed. The risks                            and benefits of the procedure and the sedation                            options and risks were discussed with the patient.                            All questions were answered, and informed consent                            was obtained. Prior Anticoagulants: The patient has                            taken no previous anticoagulant or antiplatelet  agents. ASA Grade Assessment: III - A patient with                            severe systemic disease. After reviewing the risks                            and benefits, the patient was deemed in                            satisfactory condition to undergo the procedure.                           After obtaining informed consent, the scope was     passed under direct vision. The [id number]-[id number]                            ([id number]) Olympus Ultra Slim EGD was introduced                            through the anus and advanced to the the descending                            colon. The JAG Antonietta Breach was placed through the                            ultraslim and advanced under fluoroscopy guidance                            up the colon and the scope was removed making sure                            to keep the wire in the proper position and then                            the [id number] ([id number]) Olympus EGD Therapeutic                            was introduced through the rectum and advanced to                            the distal stent. The flexible sigmoidoscopy was                            somewhat difficult due to poor bowel prep. Scope In: 10:48:21 AM Scope Out: 11:08:00 AM Total Procedure Duration: 0 hours 19 minutes 39 seconds  Findings:      A Wallstent was found in the distal sigmoid colon. We were able to get       the ultraslim endoscope through the obstruction and into the descending       colon and advance wire as above      A large amount of semi-liquid stool was found in the sigmoid colon and       in the descending colon, interfering with visualization. Lavage of  the       area was performed using a small amount of sterile water, resulting in       incomplete clearance with continued poor visualization. An ERCP Antonietta Breach       was placed.      An infiltrative partially obstructing mass was found in the distal       sigmoid colon near the distal end of the stent. The mass was       circumferential. No bleeding was present. This was stented with a 22 mm       x 9 cm WallFlex stent under fluoroscopy guidance in the customary       fashion with a moderate amount of stool immediately coming through. Impression:               - Stents in the colon.                           - Stool in the sigmoid colon and in the descending                             colon.                           - Malignant partially obstructing tumor in the                            distal sigmoid colon. Prosthesis placed.                           - An ERCP Antonietta Breach was placed.                           - No specimens collected. Moderate Sedation:      Not Applicable - Patient had care per Anesthesia. Recommendation:           - Clear liquid diet for 6 hours. If doing well may                            advance to soft solids this evening                           - Continue present medications.                           - Return to GI clinic PRN.                           - Telephone GI clinic if symptomatic PRN. Procedure Code(s):        --- Professional ---                           [phone], Sigmoidoscopy, flexible; with placement of                            endoscopic stent (includes pre- and post-dilation  and guide wire passage, when performed) Diagnosis Code(s):        --- Professional ---                           C18.7, Malignant neoplasm of sigmoid colon                           K56.690, Other partial intestinal obstruction                           K56.609, Unspecified intestinal obstruction,                            unspecified as to partial versus complete                            obstruction                           R93.3, Abnormal findings on diagnostic imaging of                            other parts of digestive tract CPT copyright 2018 American Medical Association. All rights reserved. The codes documented in this report are preliminary and upon coder review may  be revised to meet current compliance requirements. Clarene Essex, MD 06/25/2018 11:29:42 AM This report has been signed electronically. Number of Addenda: 0

## 2018-06-25 NOTE — Anesthesia Postprocedure Evaluation (Signed)
Anesthesia Post Note  Patient: Mary [name]  Procedure(s) Performed: FLEXIBLE SIGMOIDOSCOPY WITH STENT PLACEMENT (N/A ) COLONIC STENT PLACEMENT (N/A )     Patient location during evaluation: Endoscopy Anesthesia Type: MAC Level of consciousness: awake and alert Pain management: pain level controlled Vital Signs Assessment: post-procedure vital signs reviewed and stable Respiratory status: spontaneous breathing, nonlabored ventilation, respiratory function stable and patient connected to nasal cannula oxygen Cardiovascular status: blood pressure returned to baseline and stable Postop Assessment: no apparent nausea or vomiting Anesthetic complications: no    Last Vitals:  Vitals:   06/25/18 1119 06/25/18 1120  BP: (!) 105/58 120/62  Pulse: 97 86  Resp: (!) 21 13  Temp:    SpO2: 99% 98%    Last Pain:  Vitals:   06/25/18 1120  TempSrc:   PainSc: 0-No pain                 Latalia Etzler L Kaimana Neuzil

## 2018-06-25 NOTE — Anesthesia Preprocedure Evaluation (Addendum)
Anesthesia Evaluation  Patient identified by MRN, date of birth, ID band Patient awake    Reviewed: Allergy & Precautions, NPO status , Patient's Chart, lab work & pertinent test results  History of Anesthesia Complications (+) PONV  Airway Mallampati: III  TM Distance: >3 FB Neck ROM: Full  Mouth opening: Limited Mouth Opening  Dental no notable dental hx. (+) Teeth Intact, Dental Advisory Given   Pulmonary neg pulmonary ROS,    Pulmonary exam normal breath sounds clear to auscultation       Cardiovascular hypertension, Pt. on medications Normal cardiovascular exam+ dysrhythmias Atrial Fibrillation  Rhythm:Regular Rate:Normal  Ascending aortic aneurysm 3.7cm  TTE 2018 - The patient was in atrial fibrillation. Normal LV size with mild LV hypertrophy. EF 60-65%. Normal RV size and systolic function. Mild pulmonary hypertension. Mild biatrial enlargement.   Neuro/Psych negative neurological ROS  negative psych ROS   GI/Hepatic Neg liver ROS, hiatal hernia, GERD  ,  Endo/Other  negative endocrine ROS  Renal/GU negative Renal ROS  negative genitourinary   Musculoskeletal negative musculoskeletal ROS (+)   Abdominal   Peds  Hematology negative hematology ROS (+)   Anesthesia Other Findings Colonic stricture, colon CA  Reproductive/Obstetrics                            Anesthesia Physical Anesthesia Plan  ASA: III  Anesthesia Plan: MAC   Post-op Pain Management:    Induction: Intravenous  PONV Risk Score and Plan: 3 and Propofol infusion and Treatment may vary due to age or medical condition  Airway Management Planned: Natural Airway  Additional Equipment:   Intra-op Plan:   Post-operative Plan:   Informed Consent: I have reviewed the patients History and Physical, chart, labs and discussed the procedure including the risks, benefits and alternatives for the proposed anesthesia  with the patient or authorized representative who has indicated his/her understanding and acceptance.   Dental advisory given  Plan Discussed with: CRNA  Anesthesia Plan Comments:         Anesthesia Quick Evaluation

## 2018-06-25 NOTE — Discharge Instructions (Signed)
Flexible Sigmoidoscopy, Care After This sheet gives you information about how to care for yourself after your procedure. Your health care provider may also give you more specific instructions. If you have problems or questions, contact your health care provider. What can I expect after the procedure? After the procedure, it is common to have:  Abdominal cramping or pain.  Bloating.  A small amount of rectal bleeding if you had a biopsy.  Follow these instructions at home:  Take over-the-counter and prescription medicines only as told by your health care provider.  Do not drive for 24 hours if you received a medicine to help you relax (sedative).  Keep all follow-up visits as told by your health care provider. This is important. Contact a health care provider if:  You have abdominal pain or cramping that gets worse or is not helped with medicine.  You continue to have small amounts of rectal bleeding after 24 hours.  You have nausea or vomiting.  You feel weak or dizzy.  You have a fever. Get help right away if:  You pass large blood clots or see a large amount of blood in the toilet after having a bowel movement.  You have nausea or vomiting for more than 24 hours after the procedure. This information is not intended to replace advice given to you by your health care provider. Make sure you discuss any questions you have with your health care provider. Document Released: 08/03/2013 Document Revised: 02/16/2016 Document Reviewed: 10/28/2015 Elsevier Interactive Patient Education  [employer]. Call if question or problem otherwise clear liquids today until 6 PM if doing well may have soft solids and use as much MiraLAX as you need to continue bowel movements and keep them soft and follow-up as needed

## 2018-06-25 NOTE — Progress Notes (Signed)
Mary [name] 10:34 AM  Subjective: Patient with decreased bowel movement since I saw her recently in the office some increased abdominal distention but not as bad as she was in the spring and no nausea vomiting or fever and no new complaints  Objective: Vital signs stable afebrile no acute distress exam please see previous assessment evaluation occasional bowel sounds slight distention nontender  Assessment: Obstructing distal colon cancer  Plan: Okay to proceed with attempts at stenting with anesthesia assistance  [facility]  Pager [phone] After 5PM or if no answer call [phone]

## 2018-06-26 ENCOUNTER — Encounter (HOSPITAL_COMMUNITY): Payer: Self-pay | Admitting: Gastroenterology

## 2018-06-29 ENCOUNTER — Inpatient Hospital Stay: Payer: Medicare Other

## 2018-06-29 ENCOUNTER — Inpatient Hospital Stay (HOSPITAL_BASED_OUTPATIENT_CLINIC_OR_DEPARTMENT_OTHER): Payer: Medicare Other | Admitting: Nurse Practitioner

## 2018-06-29 ENCOUNTER — Encounter: Payer: Self-pay | Admitting: Nurse Practitioner

## 2018-06-29 VITALS — BP 128/79 | HR 82 | Temp 98.4°F | Resp 18 | Ht 66.0 in | Wt 173.7 lb

## 2018-06-29 DIAGNOSIS — Z7982 Long term (current) use of aspirin: Secondary | ICD-10-CM | POA: Diagnosis not present

## 2018-06-29 DIAGNOSIS — C187 Malignant neoplasm of sigmoid colon: Secondary | ICD-10-CM

## 2018-06-29 DIAGNOSIS — Z5112 Encounter for antineoplastic immunotherapy: Secondary | ICD-10-CM | POA: Diagnosis not present

## 2018-06-29 DIAGNOSIS — C787 Secondary malignant neoplasm of liver and intrahepatic bile duct: Secondary | ICD-10-CM

## 2018-06-29 DIAGNOSIS — I4891 Unspecified atrial fibrillation: Secondary | ICD-10-CM | POA: Diagnosis not present

## 2018-06-29 DIAGNOSIS — Z7189 Other specified counseling: Secondary | ICD-10-CM | POA: Diagnosis not present

## 2018-06-29 DIAGNOSIS — C7800 Secondary malignant neoplasm of unspecified lung: Secondary | ICD-10-CM

## 2018-06-29 DIAGNOSIS — I10 Essential (primary) hypertension: Secondary | ICD-10-CM | POA: Diagnosis not present

## 2018-06-29 DIAGNOSIS — Z5111 Encounter for antineoplastic chemotherapy: Secondary | ICD-10-CM | POA: Diagnosis not present

## 2018-06-29 LAB — CMP (CANCER CENTER ONLY)
ALT: 22 U/L (ref 0–44)
AST: 18 U/L (ref 15–41)
Albumin: 3.1 g/dL — ABNORMAL LOW (ref 3.5–5.0)
Alkaline Phosphatase: 85 U/L (ref 38–126)
Anion gap: 10 (ref 5–15)
BILIRUBIN TOTAL: 0.3 mg/dL (ref 0.3–1.2)
BUN: 10 mg/dL (ref 8–23)
CO2: 25 mmol/L (ref 22–32)
Calcium: 8.9 mg/dL (ref 8.9–10.3)
Chloride: 107 mmol/L (ref 98–111)
Creatinine: 0.73 mg/dL (ref 0.44–1.00)
GFR, Est AFR Am: >60 mL/min (ref >60)
Glucose, Bld: 88 mg/dL (ref 70–99)
POTASSIUM: 3.6 mmol/L (ref 3.5–5.1)
Sodium: 142 mmol/L (ref 135–145)
TOTAL PROTEIN: 7.2 g/dL (ref 6.5–8.1)

## 2018-06-29 LAB — MAGNESIUM: MAGNESIUM: 1.6 mg/dL — AB (ref 1.7–2.4)

## 2018-06-29 LAB — CBC WITH DIFFERENTIAL (CANCER CENTER ONLY)
ABS IMMATURE GRANULOCYTES: 0.11 10*3/uL — AB (ref 0.00–0.07)
BASOS ABS: 0.1 10*3/uL (ref 0.0–0.1)
BASOS PCT: 1 %
EOS ABS: 0.3 10*3/uL (ref 0.0–0.5)
Eosinophils Relative: 2 %
HCT: 36.2 % (ref 36.0–46.0)
Hemoglobin: 11.3 g/dL — ABNORMAL LOW (ref 12.0–15.0)
IMMATURE GRANULOCYTES: 1 %
Lymphocytes Relative: 15 %
Lymphs Abs: 2.1 10*3/uL (ref 0.7–4.0)
MCH: 28.5 pg (ref 26.0–34.0)
MCHC: 31.2 g/dL (ref 30.0–36.0)
MCV: 91.4 fL (ref 80.0–100.0)
Monocytes Absolute: 1.4 10*3/uL — ABNORMAL HIGH (ref 0.1–1.0)
Monocytes Relative: 10 %
NEUTROS ABS: 9.8 10*3/uL — AB (ref 1.7–7.7)
NEUTROS PCT: 71 %
NRBC: 0 % (ref 0.0–0.2)
Platelet Count: 407 10*3/uL — ABNORMAL HIGH (ref 150–400)
RBC: 3.96 MIL/uL (ref 3.87–5.11)
RDW: 15.9 % — AB (ref 11.5–15.5)
WBC: 13.8 10*3/uL — AB (ref 4.0–10.5)

## 2018-06-29 LAB — CEA (IN HOUSE-CHCC): CEA (CHCC-In House): 3.2 ng/mL (ref 0.00–5.00)

## 2018-06-29 MED ORDER — ATROPINE SULFATE 1 MG/ML IJ SOLN
0.5000 mg | Freq: Once | INTRAMUSCULAR | Status: AC | PRN
  Administered 2018-06-29: 0.5 mg via INTRAVENOUS

## 2018-06-29 MED ORDER — IRINOTECAN HCL CHEMO INJECTION 100 MG/5ML
112.0000 mg/m2 | Freq: Once | INTRAVENOUS | Status: AC
  Administered 2018-06-29: 220 mg via INTRAVENOUS
  Filled 2018-06-29: qty 11

## 2018-06-29 MED ORDER — ATROPINE SULFATE 1 MG/ML IJ SOLN
INTRAMUSCULAR | Status: AC
  Filled 2018-06-29: qty 1

## 2018-06-29 MED ORDER — SODIUM CHLORIDE 0.9 % IV SOLN
Freq: Once | INTRAVENOUS | Status: AC
  Administered 2018-06-29: 11:00:00 via INTRAVENOUS
  Filled 2018-06-29: qty 250

## 2018-06-29 MED ORDER — SODIUM CHLORIDE 0.9 % IV SOLN
6.5000 mg/kg | Freq: Once | INTRAVENOUS | Status: AC
  Administered 2018-06-29: 500 mg via INTRAVENOUS
  Filled 2018-06-29: qty 20

## 2018-06-29 NOTE — Progress Notes (Signed)
[place]  [phone] [phone]  Clinic Follow up Note   Patient Care Team: Moshe Cipro, MD as PCP - General (Internal Medicine) Alexis Frock, MD as Consulting Physician (Urology) Michael Boston, MD as Consulting Physician (General Surgery) Toma Deiters, MD as Referring Physician (Gastroenterology) 06/29/2018  SUMMARY OF ONCOLOGIC HISTORY: Oncology History   Cancer Staging Cancer of left colon [facility]) Staging form: Colon and Rectum, AJCC 8th Edition - Clinical stage from 09/04/2016: Stage IVB (cTX, cNX, pM1b) - Signed by Truitt Merle, MD on 11/12/2016       Cancer of sigmoid colon ([facility])   09/02/2016 Procedure    Colonoscopy Diverticulosis (scattered). Pt has about 10 cm structure starting at 20 cm with abnormal mucosa. Biopsies taken.     09/02/2016 Pathology Results    MODERATELY DIFFERENTIATED ADENOCARCINOMA OF COLON AT 20 CM    09/04/2016 Initial Diagnosis    Cancer of left colon ([place])    09/06/2016 Imaging    CT CAP w Contrast 1. No acute abdominopelvic process.  2. Heterogenous hypodense area involving the medial edge of the left kidney with multiple small soft tissue like stranding involving the left pararenal space. Findings are suspicious for malignancy of the left kidney with small pararenal metastases. MRI abdomen with contrast should be considered to further evaluate.  3. Vague hypodensity involving the lateral segment of the liver. This can also be reevaluated with MRI.  4. Partially visualized 5 mm posterior right lower lobe nodular density. CT chest without contrast follow up is recommended.  5. Complex multilobular left ovarian lesion may reflect a cluster of cysts.    10/10/2016 Procedure    Operative Report by Dr. Tresa Moore on 10/18/16 Procedure: 1) Cystoscopy, left retrograde pyelogram interpretation. 2) Left diagnostic uteroscopy. 3) Insertion of left uteral stent; 5 x 24 Polaris with tether. Findings: 1) Unremarkable  bladder. 2) Unremarkable left retrograde pyelogram. 3) No evidence of intraluminal urothelial neoplasm whatsoever with insepction of the left kidney and ureter times several. 4) Successful placement of left ureteral stent, proximal in renal pelvis and distal in urinary bladder.    10/15/2016 Imaging    MRI abdomen pelvis 1. There are 2 enhancing hepatic lesions highly worrisome for metastatic disease, likely metastatic colon cancer. 2. The lesion of concern in the anterior suprahilar lip of the left kidney is also enhancing, worrisome for neoplasm. This has a somewhat atypical appearance for renal cell carcinoma and could reflect a metastasis as well. 3. New left-sided hydroureter with delayed contrast excretion consistent with distal ureteral obstruction. Specific etiology uncertain. 4. Complex cystic and solid left adnexal lesion could reflect cystic neoplasm, although does not appear aggressive or appear to reflect the cause of the distal left ureteral obstruction. 5. Sigmoid colon wall thickening could be related to reported newly diagnosed colon cancer.    11/06/2016 PET scan    PET 11/06/2016 IMPRESSION: 1. Hypermetabolic pulmonary and hepatic lesions, most consistent with metastatic colon cancer, with the hypermetabolic primary located in the sigmoid colon. 2. Left renal and left adnexal lesions, better seen and evaluated on 10/15/2016. 3. Persistent moderate left hydronephrosis. 4. Aortic atherosclerosis (ICD10-170.0).    11/11/2016 Pathology Results    Liver, needle/core biopsy, left lobe - METASTATIC ADENOCARCINOMA. The histologic features are consistent with a primary gastrointestinal adenocarcinoma.    05/19/2017 Imaging    CT Abdomen Pelvis, 05/19/2017 IMPRESSION: 1. Worsening pulmonary and hepatic metastatic disease. Primary sigmoid lesion is stable to minimally enlarged. 2. Stable to slight enlargement of a left  perinephric mass, likely a metastasis. Renal cell  carcinoma is not excluded. Associated pelvocaliectasis versus mild hydronephrosis. 3. Borderline left periaortic lymph nodes, slightly enlarged. 4. Cholelithiasis. 5.  Aortic atherosclerosis (ICD10-170.0). 6. Cystic and solid lesion in the left adnexa, as on prior exams, likely ovarian in origin.    06/06/2017 - 01/16/2018 Chemotherapy    IV antibody Vectibix every [redacted] weeks along with oral chemo Xeloda for 1 week on and 1 week off starting 06/06/17. Due to skin toxicities we reduced Xeloda to 2000 mg in the AM and 1546m in the PM 1 week on and 1 week off on 10/31/17. She had multiple treatment break due to hospitalization. Restarted treatment on 12/26/17. Chemo break 01/16/18-02/20/18    08/07/2017 - 08/16/2017 Hospital Admission    Admit date: 08/07/17 Admission diagnosis: Bowel Obstruction  Additional comments: She was admitted to the hospital on 08/07/17 after [facility] due to a bowel obstruction.  She had a sigmoid colon stent placement on 08/11/17. Bowel obstruction resolved.  CT scan in hospital showed great partial response to chemotherapy. She will continue chemotherapy given good overall tolerance.      08/07/2017 Imaging    CT AP W Contrast 08/07/17 IMPRESSION: Low colonic obstruction secondary to a rectosigmoid stricture at the site of the patient's prior colonic mass, which is no longer evident on CT. Mild upstream colonic wall thickening raises the possibility of superimposed colitis.  Improving hepatic metastases, as above. Left perirenal metastasis, decreased. Visualized pulmonary metastases at the lung bases are grossly unchanged.  3.5 cm mixed cystic/solid left ovarian mass is mildly decreased.     08/11/2017 Procedure    By Dr. MWatt Climes Flexible Sigmoidoscopy 08/11/17 IMPRESSION:   - Preparation of the colon was fair. - Likely malignant partially obstructing tumor in the recto-sigmoid colon versus necrotic scarring. Injected with contrast  proximal to obstruction to confirm proper position. Prosthesis placed. - Stool in the rectum. - No specimens collected.     10/22/2017 Imaging    CT CAP W Contrast 10/22/17 IMPRESSION: 1. Significant interval improvement and liver metastasis. 2. The left perinephric mass is slightly decreased in size in the interval. 3. No significant interval change in the appearance of multiple pulmonary metastasis. 4. Stable appearance of mediastinal adenopathy. 5. Gallstones 6.  Aortic Atherosclerosis (ICD10-I70.0). 7. Colonic stent remains in place without evidence for bowel obstruction.    12/01/2017 - 12/07/2017 Hospital Admission    Admit date: 12/01/17 Admission diagnosis: Bowel Obstruction and abdominal pain Additional comments: had flexible sigmoidoscopy by Dr. MWatt Climeson 12/04/17. Chemo was held during this time.     12/04/2017 Procedure    Flexible Sigmoidoscopy 12/04/17 IMPRESSION - Preparation of the colon was fair. - Stent in the colon. - Stricture in the distal sigmoid colon. Prosthesis placed. - No specimens collected.    02/18/2018 Imaging    CT CAP W Contrast 02/18/18 IMPRESSION: CT CHEST IMPRESSION 1. Bilateral pulmonary metastasis. Compared to the 12/01/2017 abdominal CT, mixed response to therapy. Compared to the 10/22/2017 chest CT, overall slight progression. 2. Similar thoracic adenopathy. 3.  Aortic Atherosclerosis (ICD10-I70.0). CT ABDOMEN AND PELVIS IMPRESSION 1. Since 12/01/2017, similar hepatic metastatic burden. Left renal/perirenal lesion is similar to minimally decreased in size. 2. Sigmoid colonic stent in place with resolution of colonic obstruction. 3. Similar mild soft tissue fullness within both ovaries.    02/20/2018 -  Chemotherapy    second line Irinotecan and Victibix every 2 weeks on 02/20/2018     06-10-202019  Imaging    IMPRESSION: -Stable distal colonic stent with eccentric soft tissue thickening on the right. No evidence of bowel  obstruction. -Improving pulmonary and hepatic metastases, with index lesions as above. -Improving thoracic lymphadenopathy. -Stable fullness of the bilateral ovaries. -Stable enhancing lesion in the medial interpolar left kidney.    CURRENT THERAPY:  Second line Irinotecan and Vectibix every 2 weeks started on 02/20/2018, chemo break since 04/20/2018; received panitumumab on 10/14 during chemo break; received panitumumab only on 11/5 due to obstructive symptoms   INTERVAL HISTORY: Ms. Tamura returns for follow up and treatment as scheduled. She was last seen on 11/5 when she presented with obstructive symptoms including abdominal pain and nausea. She received panitumumab alone. STAT CT on 11/5 with findings consistent with developing distal large bowel obstruction. She was seen by Dr. Watt Climes who proceeded with flex sigmoid and stent placement at the site of circumferential mass at distal sigmoid colon on 06/25/18.   Today she feels well. She tolerated flex sigmoid and stent very well. She has gas but denies abdominal pain. Nausea resolved. No emesis. Having 2-3 bowel movements per day; denies diarrhea or blood in stool. She feels she has recovered to baseline. Appetite and energy level are improving. She remains independent at home. Denies fever or chills. She has chronic but fluctuating night sweats, improved from last week. Denies cough, chest pain, dyspnea, leg edema, or neuropathy.    MEDICAL HISTORY:  Past Medical History:  Diagnosis Date  . Aneurysm of ascending aorta ([facility]) 12/03/2016   3.7 cm by echo 10/2016  . Colon cancer [facility])    dx via biospy from colonoscopy-- malignant ---  currently having work-up done w/ dr gross (general surgeon) and coordinate surgery w/ urologsit for renal tumor  . Essential hypertension 10/31/2016  . GERD (gastroesophageal reflux disease)   . Glaucoma   . Hiatal hernia   . HOH (hard of hearing)    bilateral even w/ hearing aids  . Hyperlipidemia   . Kidney  tumor    left side  . Legally blind in right eye, as defined in [place]    due to macular degeneration  . Macular degeneration   . Nausea    intermittant due to colon mass  . Ovarian cyst   . Persistent atrial fibrillation 10/31/2016   on ASA only  . PONV (postoperative nausea and vomiting)   . Wears glasses   . Wears hearing aid    bilateral    SURGICAL HISTORY: Past Surgical History:  Procedure Laterality Date  . BREAST CYST EXCISION  1990   benign  . COLONIC STENT PLACEMENT N/A 12/04/2017   Procedure: COLONIC STENT PLACEMENT;  Surgeon: Clarene Essex, MD;  Location: [facility];  Service: Endoscopy;  Laterality: N/A;  . COLONIC STENT PLACEMENT N/A 06/25/2018   Procedure: COLONIC STENT PLACEMENT;  Surgeon: Clarene Essex, MD;  Location: [facility];  Service: Endoscopy;  Laterality: N/A;  . COLONOSCOPY  09/04/2016  . CYSTOSCOPY WITH RETROGRADE PYELOGRAM, URETEROSCOPY AND STENT PLACEMENT Left 10/10/2016   Procedure: CYSTOSCOPY WITH RETROGRADE PYELOGRAM, DIAGNOSTIC URETEROSCOPY  AND STENT PLACEMENT;  Surgeon: Alexis Frock, MD;  Location: [facility];  Service: Urology;  Laterality: Left;  . ESOPHAGOGASTRODUODENOSCOPY  11/07/2014  . FLEXIBLE SIGMOIDOSCOPY N/A 08/11/2017   Procedure: FLEXIBLE SIGMOIDOSCOPY;  Surgeon: Clarene Essex, MD;  Location: [facility];  Service: Endoscopy;  Laterality: N/A;  With Fluoroscopy for colonic stent placement for colonic stricture  . FLEXIBLE SIGMOIDOSCOPY N/A 12/04/2017   Procedure: FLEXIBLE SIGMOIDOSCOPY;  Surgeon: Clarene Essex, MD;  Location: [facility];  Service: Endoscopy;  Laterality: N/A;  . FLEXIBLE SIGMOIDOSCOPY N/A 06/25/2018   Procedure: FLEXIBLE SIGMOIDOSCOPY WITH STENT PLACEMENT;  Surgeon: Clarene Essex, MD;  Location: [facility];  Service: Endoscopy;  Laterality: N/A;  with stent placement  . NASAL SINUS SURGERY    . TONSILLECTOMY      I have reviewed the social history and family history with the patient and they are unchanged  from previous note.  ALLERGIES:  is allergic to benazepril; tylenol [acetaminophen]; and bactrim [sulfamethoxazole-trimethoprim].  MEDICATIONS:  Current Outpatient Medications  Medication Sig Dispense Refill  . amLODipine (NORVASC) 5 MG tablet Take 1 tablet (5 mg total) by mouth daily. 90 tablet 1  . bisacodyl (BISACODYL) 5 MG EC tablet Take 10 mg by mouth 2 (two) times daily.    . bisacodyl (DULCOLAX) 10 MG suppository Place 10 mg rectally daily as needed (severe constipation.).     Marland Kitchen diltiazem (CARDIZEM CD) 240 MG 24 hr capsule Take 1 capsule (240 mg total) by mouth daily. 90 capsule 1  . escitalopram (LEXAPRO) 10 MG tablet Take 10 mg by mouth daily.    . fexofenadine (ALLEGRA) 180 MG tablet Take 180 mg by mouth daily as needed for allergies.     Marland Kitchen latanoprost (XALATAN) 0.005 % ophthalmic solution Place 1 drop into both eyes at bedtime.    Marland Kitchen losartan (COZAAR) 100 MG tablet Take 1 tablet (100 mg total) by mouth daily. 90 tablet 0  . Multiple Vitamins-Minerals (VITEYES AREDS ADVANCED) CAPS Take 1 capsule by mouth daily.    Marland Kitchen omeprazole (PRILOSEC) 40 MG capsule Take 40 mg by mouth daily.    . ondansetron (ZOFRAN ODT) 4 MG disintegrating tablet Take 1 tablet (4 mg total) by mouth every 8 (eight) hours as needed for nausea or vomiting. 90 tablet 1  . oxyCODONE (OXY IR/ROXICODONE) 5 MG immediate release tablet Take 1 tablet (5 mg total) by mouth every 6 (six) hours as needed for moderate pain or severe pain. 90 tablet 0  . Polyethyl Glycol-Propyl Glycol (SYSTANE) 0.4-0.3 % SOLN Place 1 drop into both eyes 3 (three) times daily as needed (dry eyes).     . polyethylene glycol (MIRALAX / GLYCOLAX) packet Take 17 g by mouth daily as needed for moderate constipation. 30 each 0  . Potassium Chloride ER 20 MEQ TBCR Take 20 mEq by mouth daily. 30 tablet 0  . prochlorperazine (COMPAZINE) 10 MG tablet Take 1 tablet (10 mg total) by mouth every 6 (six) hours as needed for nausea or vomiting. 30 tablet 0  .  sennosides-docusate sodium (SENOKOT-S) 8.6-50 MG tablet Take 1-2 tablets by mouth 3 (three) times daily.    . urea (CARMOL) 10 % cream Apply topically as needed. (Patient taking differently: Apply 1 application topically 3 (three) times daily as needed (for skin irritation). ) 71 g 0   Current Facility-Administered Medications  Medication Dose Route Frequency Provider Last Rate Last Dose  . sodium chloride flush (NS) 0.9 % injection 3 mL  3 mL Intravenous PRN Skeet Latch, MD        PHYSICAL EXAMINATION: ECOG PERFORMANCE STATUS: 1 - Symptomatic but completely ambulatory  Vitals:   06/29/18 0951  BP: 128/79  Pulse: 82  Resp: 18  Temp: 98.4 F (36.9 C)  SpO2: 100%   Filed Weights   06/29/18 0951  Weight: 173 lb 11.2 oz (78.8 kg)    GENERAL:alert, no distress and comfortable SKIN: no rashes or significant lesions  EYES:  sclera clear OROPHARYNX:no thrush or ulcers  LYMPH:  no palpable cervical or supraclavicular lymphadenopathy LUNGS: clear to auscultation with normal breathing effort HEART: regular rate & rhythm, no lower extremity edema ABDOMEN:abdomen soft, non-tender and normal bowel sounds Musculoskeletal:no cyanosis of digits and no clubbing  NEURO: alert & oriented x 3 with fluent speech, no focal motor/sensory deficits  LABORATORY DATA:  I have reviewed the data as listed CBC Latest Ref Rng & Units 06/29/2018 06/16/2018 05/25/2018  WBC 4.0 - 10.5 K/uL 13.8(H) 12.1(H) 13.7(H)  Hemoglobin 12.0 - 15.0 g/dL 11.3(L) 11.6(L) 11.0(L)  Hematocrit 36.0 - 46.0 % 36.2 37.7 35.9(L)  Platelets 150 - 400 K/uL 407(H) 453(H) 398     CMP Latest Ref Rng & Units 06/29/2018 06/16/2018 05/25/2018  Glucose 70 - 99 mg/dL 88 83 82  BUN 8 - 23 mg/dL 10 10 12   Creatinine 0.44 - 1.00 mg/dL 0.73 0.94 0.88  Sodium 135 - 145 mmol/L 142 137 137  Potassium 3.5 - 5.1 mmol/L 3.6 3.9 4.0  Chloride 98 - 111 mmol/L 107 105 101  CO2 22 - 32 mmol/L 25 26 25   Calcium 8.9 - 10.3 mg/dL 8.9 9.5 9.2   Total Protein 6.5 - 8.1 g/dL 7.2 7.8 7.5  Total Bilirubin 0.3 - 1.2 mg/dL 0.3 0.4 0.5  Alkaline Phos 38 - 126 U/L 85 87 69  AST 15 - 41 U/L 18 15 17   ALT 0 - 44 U/L 22 13 10       RADIOGRAPHIC STUDIES: I have personally reviewed the radiological images as listed and agreed with the findings in the report. No results found.   ASSESSMENT & PLAN: Mary [name] is a 84 y.o. female   1. Adenocarcinoma of left colon, sigmoid colon, TxNxM1 with liver and lung mets, stage IV, KRAS/NRS wild type, MSI-stable  -Mary [name] appears stable. She resumed treatment after 2 month chemo break with panitumumab only on 11/5. She developed CT evidence of distal large bowel obstruction and underwent stenting per Dr. Watt Climes on 11/14. She has recovered well, symptoms resolved.  -CBC and CMP reviewed, mild leukocytosis and thrombocytosis, but stable. Hgb stable. Mg 1.6, I recommend she begin OTC Mg supplement once daily. CEA is normal. Labs adequate to proceed with irinotecan and panitumumab today.  -She will return in 2 weeks for f/u and next cycle.   2. Constipation, abdominal pain  -secondary to cancer and recurrent bowel obstruction -currently resolved   3. Partial bowel obstruction, s/p sigmoid stent placement on 08/11/17 and again on 12/04/17 -After 2 months chemo break, she developed signs of bowel obstruction again including abdominal pain and nausea.  -CT on 11/5 compatible with developing distal large bowel obstruction, s/p flex sigmoidoscopy and stent placement on 11/14  -Symptoms resolved s/p stent   4. Left renal mass and left hydronephrosis -Possible renal cell carcinoma, metastatic lesion from colon cancer is also a possibility. -She has been seen by [facility], Dr. Tresa Moore, and had left ureter stent placement on 10/10/2016.  -no signs of hydronephrosis on 02/18/18 CT scan, and a left renal mass is likely decreased but overall similar.  -06/16/18 CT showed stable mass and no hydronephrosis   5. Complex cyst  of left ovary -will monitor. Likely benign -02/18/18 CT shows overall stable cyst  -05/01/18 CT showed stable fullness of bilateral ovaries -06/16/18 CT showed stable bilateral adnexal masses, 4.3 x3.0 on left, 3.8 x2.6 on right   6. Atrial Fibrillation -The patient has a history of atrial fibrillation. -The patient should continue  taking baby aspirin.  7. HTN -previously advised the patient to continue medications and follow up with primary care physician -BP 128/79 today, in normal range lately    8. Nutrition -previously referred to Nutrition.   8.Goal of care discussion  -The patient understands the goal of care is palliative.   Plan:  -Labs reviewed -Mg 1.6, begin OTC mag supplement 1 per day -Proceed with irinotecan and panitumumab today -F/u in 2 weeks with next cycle   All questions were answered. The patient knows to call the clinic with any problems, questions or concerns. No barriers to learning was detected. I spent 20 minutes counseling the patient face to face. The total time spent in the appointment was 25 minutes and more than 50% was on counseling and review of test results     Alla Feeling, NP 06/29/18

## 2018-06-29 NOTE — Patient Instructions (Addendum)
[place] Discharge Instructions for Patients Receiving Chemotherapy  Today you received the following chemotherapy agents panitumumab (Vectibix) and irinotecan (Camptosar)  To help prevent nausea and vomiting after your treatment, we encourage you to take your nausea medication as directed   If you develop nausea and vomiting that is not controlled by your nausea medication, call the clinic.   BELOW ARE SYMPTOMS THAT SHOULD BE REPORTED IMMEDIATELY:  *FEVER GREATER THAN 100.5 F  *CHILLS WITH OR WITHOUT FEVER  NAUSEA AND VOMITING THAT IS NOT CONTROLLED WITH YOUR NAUSEA MEDICATION  *UNUSUAL SHORTNESS OF BREATH  *UNUSUAL BRUISING OR BLEEDING  TENDERNESS IN MOUTH AND THROAT WITH OR WITHOUT PRESENCE OF ULCERS  *URINARY PROBLEMS  *BOWEL PROBLEMS  UNUSUAL RASH Items with * indicate a potential emergency and should be followed up as soon as possible.  Feel free to call the clinic should you have any questions or concerns. The clinic phone number is [phone].  Please show the [place] at check-in to the Emergency Department and triage nurse.

## 2018-07-12 NOTE — Progress Notes (Signed)
[place]  [phone] [phone]  Clinic Follow up Note   Patient Care Team: Moshe Cipro, MD as PCP - General (Internal Medicine) Alexis Frock, MD as Consulting Physician (Urology) Michael Boston, MD as Consulting Physician (General Surgery) Toma Deiters, MD as Referring Physician (Gastroenterology) 07/13/2018  Chief Complaint: F/u on colon cancer  SUMMARY OF ONCOLOGIC HISTORY: Oncology History   Cancer Staging Cancer of left colon [facility]) Staging form: Colon and Rectum, AJCC 8th Edition - Clinical stage from 09/04/2016: Stage IVB (cTX, cNX, pM1b) - Signed by Truitt Merle, MD on 11/12/2016       Cancer of sigmoid colon ([place])   09/02/2016 Procedure    Colonoscopy Diverticulosis (scattered). Pt has about 10 cm structure starting at 20 cm with abnormal mucosa. Biopsies taken.     09/02/2016 Pathology Results    MODERATELY DIFFERENTIATED ADENOCARCINOMA OF COLON AT 20 CM    09/04/2016 Initial Diagnosis    Cancer of left colon ([place])    09/06/2016 Imaging    CT CAP w Contrast 1. No acute abdominopelvic process.  2. Heterogenous hypodense area involving the medial edge of the left kidney with multiple small soft tissue like stranding involving the left pararenal space. Findings are suspicious for malignancy of the left kidney with small pararenal metastases. MRI abdomen with contrast should be considered to further evaluate.  3. Vague hypodensity involving the lateral segment of the liver. This can also be reevaluated with MRI.  4. Partially visualized 5 mm posterior right lower lobe nodular density. CT chest without contrast follow up is recommended.  5. Complex multilobular left ovarian lesion may reflect a cluster of cysts.    10/10/2016 Procedure    Operative Report by Dr. Tresa Moore on 10/18/16 Procedure: 1) Cystoscopy, left retrograde pyelogram interpretation. 2) Left diagnostic uteroscopy. 3) Insertion of left uteral stent; 5 x 24 Polaris  with tether. Findings: 1) Unremarkable bladder. 2) Unremarkable left retrograde pyelogram. 3) No evidence of intraluminal urothelial neoplasm whatsoever with insepction of the left kidney and ureter times several. 4) Successful placement of left ureteral stent, proximal in renal pelvis and distal in urinary bladder.    10/15/2016 Imaging    MRI abdomen pelvis 1. There are 2 enhancing hepatic lesions highly worrisome for metastatic disease, likely metastatic colon cancer. 2. The lesion of concern in the anterior suprahilar lip of the left kidney is also enhancing, worrisome for neoplasm. This has a somewhat atypical appearance for renal cell carcinoma and could reflect a metastasis as well. 3. New left-sided hydroureter with delayed contrast excretion consistent with distal ureteral obstruction. Specific etiology uncertain. 4. Complex cystic and solid left adnexal lesion could reflect cystic neoplasm, although does not appear aggressive or appear to reflect the cause of the distal left ureteral obstruction. 5. Sigmoid colon wall thickening could be related to reported newly diagnosed colon cancer.    11/06/2016 PET scan    PET 11/06/2016 IMPRESSION: 1. Hypermetabolic pulmonary and hepatic lesions, most consistent with metastatic colon cancer, with the hypermetabolic primary located in the sigmoid colon. 2. Left renal and left adnexal lesions, better seen and evaluated on 10/15/2016. 3. Persistent moderate left hydronephrosis. 4. Aortic atherosclerosis (ICD10-170.0).    11/11/2016 Pathology Results    Liver, needle/core biopsy, left lobe - METASTATIC ADENOCARCINOMA. The histologic features are consistent with a primary gastrointestinal adenocarcinoma.    05/19/2017 Imaging    CT Abdomen Pelvis, 05/19/2017 IMPRESSION: 1. Worsening pulmonary and hepatic metastatic disease. Primary sigmoid lesion is stable to minimally enlarged. 2.  Stable to slight enlargement of a left perinephric  mass, likely a metastasis. Renal cell carcinoma is not excluded. Associated pelvocaliectasis versus mild hydronephrosis. 3. Borderline left periaortic lymph nodes, slightly enlarged. 4. Cholelithiasis. 5.  Aortic atherosclerosis (ICD10-170.0). 6. Cystic and solid lesion in the left adnexa, as on prior exams, likely ovarian in origin.    06/06/2017 - 01/16/2018 Chemotherapy    IV antibody Vectibix every [redacted] weeks along with oral chemo Xeloda for 1 week on and 1 week off starting 06/06/17. Due to skin toxicities we reduced Xeloda to 2000 mg in the AM and 1512m in the PM 1 week on and 1 week off on 10/31/17. She had multiple treatment break due to hospitalization. Restarted treatment on 12/26/17. Chemo break 01/16/18-02/20/18    08/07/2017 - 08/16/2017 Hospital Admission    Admit date: 08/07/17 Admission diagnosis: Bowel Obstruction  Additional comments: She was admitted to the hospital on 08/07/17 after [facility] due to a bowel obstruction.  She had a sigmoid colon stent placement on 08/11/17. Bowel obstruction resolved.  CT scan in hospital showed great partial response to chemotherapy. She will continue chemotherapy given good overall tolerance.      08/07/2017 Imaging    CT AP W Contrast 08/07/17 IMPRESSION: Low colonic obstruction secondary to a rectosigmoid stricture at the site of the patient's prior colonic mass, which is no longer evident on CT. Mild upstream colonic wall thickening raises the possibility of superimposed colitis.  Improving hepatic metastases, as above. Left perirenal metastasis, decreased. Visualized pulmonary metastases at the lung bases are grossly unchanged.  3.5 cm mixed cystic/solid left ovarian mass is mildly decreased.     08/11/2017 Procedure    By Dr. MWatt Climes Flexible Sigmoidoscopy 08/11/17 IMPRESSION:   - Preparation of the colon was fair. - Likely malignant partially obstructing tumor in the recto-sigmoid colon versus  necrotic scarring. Injected with contrast proximal to obstruction to confirm proper position. Prosthesis placed. - Stool in the rectum. - No specimens collected.     10/22/2017 Imaging    CT CAP W Contrast 10/22/17 IMPRESSION: 1. Significant interval improvement and liver metastasis. 2. The left perinephric mass is slightly decreased in size in the interval. 3. No significant interval change in the appearance of multiple pulmonary metastasis. 4. Stable appearance of mediastinal adenopathy. 5. Gallstones 6.  Aortic Atherosclerosis (ICD10-I70.0). 7. Colonic stent remains in place without evidence for bowel obstruction.    12/01/2017 - 12/07/2017 Hospital Admission    Admit date: 12/01/17 Admission diagnosis: Bowel Obstruction and abdominal pain Additional comments: had flexible sigmoidoscopy by Dr. MWatt Climeson 12/04/17. Chemo was held during this time.     12/04/2017 Procedure    Flexible Sigmoidoscopy 12/04/17 IMPRESSION - Preparation of the colon was fair. - Stent in the colon. - Stricture in the distal sigmoid colon. Prosthesis placed. - No specimens collected.    02/18/2018 Imaging    CT CAP W Contrast 02/18/18 IMPRESSION: CT CHEST IMPRESSION 1. Bilateral pulmonary metastasis. Compared to the 12/01/2017 abdominal CT, mixed response to therapy. Compared to the 10/22/2017 chest CT, overall slight progression. 2. Similar thoracic adenopathy. 3.  Aortic Atherosclerosis (ICD10-I70.0). CT ABDOMEN AND PELVIS IMPRESSION 1. Since 12/01/2017, similar hepatic metastatic burden. Left renal/perirenal lesion is similar to minimally decreased in size. 2. Sigmoid colonic stent in place with resolution of colonic obstruction. 3. Similar mild soft tissue fullness within both ovaries.    02/20/2018 -  Chemotherapy    second line Irinotecan and Victibix every 2 weeks  on 02/20/2018     05/13/202019 Imaging    IMPRESSION: -Stable distal colonic stent with eccentric soft tissue thickening on the  right. No evidence of bowel obstruction. -Improving pulmonary and hepatic metastases, with index lesions as above. -Improving thoracic lymphadenopathy. -Stable fullness of the bilateral ovaries. -Stable enhancing lesion in the medial interpolar left kidney.     CURRENT THERAPY Second line Irinotecan and Vectibix every 2 weeks started on 02/20/2018   INTERVAL HISTORY: Mary [name] is a 84 y.o. female who is here for follow-up. She is here today at the infusion room with her family member. She says that she couldn't sleep after her last cycle. She is not exercising, and states that her head hurts her. She previously tried Ambien, but didn't tolerate well. She also reports feeling fatigued after last treatment, but cannot take naps. She tries to rest when she feels tired. She tires to eat well and her appetite is fair. She denies LL edema. She denies fever, nausea or diarrhea.    Pertinent positives and negatives of review of systems are listed and detailed within the above HPI.   REVIEW OF SYSTEMS:   Constitutional: Denies fevers, chills or abnormal weight loss (+) insomnia (+) fatigue  Eyes: Denies blurriness of vision Ears, nose, mouth, throat, and face: Denies mucositis or sore throat Respiratory: Denies cough, dyspnea or wheezes Cardiovascular: Denies palpitation, chest discomfort or lower extremity swelling Gastrointestinal:  Denies nausea, heartburn or change in bowel habits Skin: Denies abnormal skin rashes Lymphatics: Denies new lymphadenopathy or easy bruising Neurological:Denies numbness, tingling or new weaknesses Behavioral/Psych: Mood is stable, no new changes  All other systems were reviewed with the patient and are negative.  MEDICAL HISTORY:  Past Medical History:  Diagnosis Date  . Aneurysm of ascending aorta (H[facility]) 12/03/2016   3.7 cm by echo 10/2016  . Colon cancer [facility])    dx via biospy from colonoscopy-- malignant ---  currently having work-up done w/ dr gross  (general surgeon) and coordinate surgery w/ urologsit for renal tumor  . Essential hypertension 10/31/2016  . GERD (gastroesophageal reflux disease)   . Glaucoma   . Hiatal hernia   . HOH (hard of hearing)    bilateral even w/ hearing aids  . Hyperlipidemia   . Kidney tumor    left side  . Legally blind in right eye, as defined in [place]    due to macular degeneration  . Macular degeneration   . Nausea    intermittant due to colon mass  . Ovarian cyst   . Persistent atrial fibrillation 10/31/2016   on ASA only  . PONV (postoperative nausea and vomiting)   . Wears glasses   . Wears hearing aid    bilateral    SURGICAL HISTORY: Past Surgical History:  Procedure Laterality Date  . BREAST CYST EXCISION  1990   benign  . COLONIC STENT PLACEMENT N/A 12/04/2017   Procedure: COLONIC STENT PLACEMENT;  Surgeon: Clarene Essex, MD;  Location: [facility];  Service: Endoscopy;  Laterality: N/A;  . COLONIC STENT PLACEMENT N/A 06/25/2018   Procedure: COLONIC STENT PLACEMENT;  Surgeon: Clarene Essex, MD;  Location: [facility];  Service: Endoscopy;  Laterality: N/A;  . COLONOSCOPY  09/04/2016  . CYSTOSCOPY WITH RETROGRADE PYELOGRAM, URETEROSCOPY AND STENT PLACEMENT Left 10/10/2016   Procedure: CYSTOSCOPY WITH RETROGRADE PYELOGRAM, DIAGNOSTIC URETEROSCOPY  AND STENT PLACEMENT;  Surgeon: Alexis Frock, MD;  Location[facility];  Service: Urology;  Laterality: Left;  . ESOPHAGOGASTRODUODENOSCOPY  11/07/2014  .  FLEXIBLE SIGMOIDOSCOPY N/A 08/11/2017   Procedure: FLEXIBLE SIGMOIDOSCOPY;  Surgeon: Clarene Essex, MD;  Location: [facility];  Service: Endoscopy;  Laterality: N/A;  With Fluoroscopy for colonic stent placement for colonic stricture  . FLEXIBLE SIGMOIDOSCOPY N/A 12/04/2017   Procedure: FLEXIBLE SIGMOIDOSCOPY;  Surgeon: Clarene Essex, MD;  Location: [facility];  Service: Endoscopy;  Laterality: N/A;  . FLEXIBLE SIGMOIDOSCOPY N/A 06/25/2018   Procedure: FLEXIBLE SIGMOIDOSCOPY WITH  STENT PLACEMENT;  Surgeon: Clarene Essex, MD;  Location: [facility];  Service: Endoscopy;  Laterality: N/A;  with stent placement  . NASAL SINUS SURGERY    . TONSILLECTOMY      I have reviewed the social history and family history with the patient and they are unchanged from previous note.  ALLERGIES:  is allergic to benazepril; tylenol [acetaminophen]; and bactrim [sulfamethoxazole-trimethoprim].  MEDICATIONS:  Current Outpatient Medications  Medication Sig Dispense Refill  . amLODipine (NORVASC) 5 MG tablet Take 1 tablet (5 mg total) by mouth daily. 90 tablet 1  . bisacodyl (BISACODYL) 5 MG EC tablet Take 10 mg by mouth 2 (two) times daily.    . bisacodyl (DULCOLAX) 10 MG suppository Place 10 mg rectally daily as needed (severe constipation.).     Marland Kitchen diltiazem (CARDIZEM CD) 240 MG 24 hr capsule Take 1 capsule (240 mg total) by mouth daily. 90 capsule 1  . escitalopram (LEXAPRO) 10 MG tablet Take 10 mg by mouth daily.    . fexofenadine (ALLEGRA) 180 MG tablet Take 180 mg by mouth daily as needed for allergies.     Marland Kitchen latanoprost (XALATAN) 0.005 % ophthalmic solution Place 1 drop into both eyes at bedtime.    Marland Kitchen losartan (COZAAR) 100 MG tablet Take 1 tablet (100 mg total) by mouth daily. 90 tablet 0  . Multiple Vitamins-Minerals (VITEYES AREDS ADVANCED) CAPS Take 1 capsule by mouth daily.    Marland Kitchen omeprazole (PRILOSEC) 40 MG capsule Take 40 mg by mouth daily.    . ondansetron (ZOFRAN ODT) 4 MG disintegrating tablet Take 1 tablet (4 mg total) by mouth every 8 (eight) hours as needed for nausea or vomiting. 90 tablet 1  . oxyCODONE (OXY IR/ROXICODONE) 5 MG immediate release tablet Take 1 tablet (5 mg total) by mouth every 6 (six) hours as needed for moderate pain or severe pain. 90 tablet 0  . Polyethyl Glycol-Propyl Glycol (SYSTANE) 0.4-0.3 % SOLN Place 1 drop into both eyes 3 (three) times daily as needed (dry eyes).     . polyethylene glycol (MIRALAX / GLYCOLAX) packet Take 17 g by mouth daily as  needed for moderate constipation. 30 each 0  . Potassium Chloride ER 20 MEQ TBCR Take 20 mEq by mouth daily. 30 tablet 0  . prochlorperazine (COMPAZINE) 10 MG tablet Take 1 tablet (10 mg total) by mouth every 6 (six) hours as needed for nausea or vomiting. 30 tablet 0  . sennosides-docusate sodium (SENOKOT-S) 8.6-50 MG tablet Take 1-2 tablets by mouth 3 (three) times daily.    . urea (CARMOL) 10 % cream Apply topically as needed. (Patient taking differently: Apply 1 application topically 3 (three) times daily as needed (for skin irritation). ) 71 g 0   Current Facility-Administered Medications  Medication Dose Route Frequency Provider Last Rate Last Dose  . sodium chloride flush (NS) 0.9 % injection 3 mL  3 mL Intravenous PRN Skeet Latch, MD       Facility-Administered Medications Ordered in Other Visits  Medication Dose Route Frequency Provider Last Rate Last Dose  . atropine injection  0.5 mg  0.5 mg Intravenous Once PRN Truitt Merle, MD      . dexamethasone (DECADRON) injection 10 mg  10 mg Intravenous Once Truitt Merle, MD      . irinotecan (CAMPTOSAR) 180 mg in dextrose 5 % 500 mL chemo infusion  90 mg/m2 (Treatment Plan Recorded) Intravenous Once Truitt Merle, MD      . palonosetron Leona Carry) injection 0.25 mg  0.25 mg Intravenous Once Truitt Merle, MD      . panitumumab (VECTIBIX) 500 mg in sodium chloride 0.9 % 100 mL chemo infusion  6.5 mg/kg (Treatment Plan Recorded) Intravenous Once Truitt Merle, MD        PHYSICAL EXAMINATION: ECOG PERFORMANCE STATUS: 2 - Symptomatic, <50% confined to bed Vitals: BP 124/68 Pulse 61 RR 18 BMI 28.22 GENERAL:alert, no distress and comfortable SKIN: skin color, texture, turgor are normal, no rashes or significant lesions EYES: normal, Conjunctiva are pink and non-injected, sclera clear OROPHARYNX:no exudate, no erythema and lips, buccal mucosa, and tongue normal  NECK: supple, thyroid normal size, non-tender, without nodularity LYMPH:  no palpable  lymphadenopathy in the cervical, axillary or inguinal LUNGS: clear to auscultation and percussion with normal breathing effort HEART: regular rate & rhythm and no murmurs and no lower extremity edema ABDOMEN:abdomen soft, non-tender and normal bowel sounds Musculoskeletal:no cyanosis of digits and no clubbing  NEURO: alert & oriented x 3 with fluent speech, no focal motor/sensory deficits  LABORATORY DATA:  I have reviewed the data as listed CBC Latest Ref Rng & Units 07/13/2018 06/29/2018 06/16/2018  WBC 4.0 - 10.5 K/uL 7.7 13.8(H) 12.1(H)  Hemoglobin 12.0 - 15.0 g/dL 10.5(L) 11.3(L) 11.6(L)  Hematocrit 36.0 - 46.0 % 33.8(L) 36.2 37.7  Platelets 150 - 400 K/uL 404(H) 407(H) 453(H)     CMP Latest Ref Rng & Units 07/13/2018 06/29/2018 06/16/2018  Glucose 70 - 99 mg/dL 89 88 83  BUN 8 - 23 mg/dL 13 10 10   Creatinine 0.44 - 1.00 mg/dL 0.81 0.73 0.94  Sodium 135 - 145 mmol/L 141 142 137  Potassium 3.5 - 5.1 mmol/L 3.8 3.6 3.9  Chloride 98 - 111 mmol/L 106 107 105  CO2 22 - 32 mmol/L 25 25 26   Calcium 8.9 - 10.3 mg/dL 9.3 8.9 9.5  Total Protein 6.5 - 8.1 g/dL 7.3 7.2 7.8  Total Bilirubin 0.3 - 1.2 mg/dL 0.4 0.3 0.4  Alkaline Phos 38 - 126 U/L 75 85 87  AST 15 - 41 U/L 13(L) 18 15  ALT 0 - 44 U/L 11 22 13       RADIOGRAPHIC STUDIES: I have personally reviewed the radiological images as listed and agreed with the findings in the report. No results found.   ASSESSMENT & PLAN:  Mary [name] is a 84 y.o. female with history of  1. Adenocarcinoma of sigmoid colon, TxNxM1 with liver and lung mets, stage IV, KRAS/NRS wild type, MSI-stable  -Diagnosed 08/2016. She has progressed on first line Xeloda and Vectibix. -Currently on second line Irinotecan and Vectibix every 2 weeks. She also had multiple stent placements for partial bowel obstruction with the latest (third) being inserted on 11/14 by Dr Watt Climes. The obstruction developed after a long chemo break -Labs reviewed, CBC shoed Hg 10.5  PLT 404K.  -She has had moderate to severe fatigued from chemo, otherwise tolerating OK. I will reduce the chemo irinotecan dose to 28m/m2 today. -She also experienced insomnia after last cycle. I will prescribe Xanax   2.  Recurrent partial bowel obstruction secondary to  sigmoid colon mass, status post stent placement on 08/11/17, 12/04/2017 and 06/25/2018 -She was hospitalized several times for partial bowel obstruction from the tumor -Her symptoms has resolved since last stent placement by Dr. Watt Climes -She is open to surgery if no other options -I recommend her to continue low residue diet, and palliative chemo   3. Left renal mass  --Possible renal cell carcinoma, metastatic lesion from colon cancer is also a possibility. -She has been seen by [facility], Dr. Tresa Moore, and had left ureter stent placement on 10/10/2016.  -Last CT scan on 06/16/2018 showed stable mass  4. Complex left ovarian cyst -06/16/18 CT showed stable bilateral adnexal masses, 4.3 x3.0 on left, 3.8 x2.6 on right   5. Atrial Fibrillation - Currently on baby aspirin  6. HTN -Continue recommended meds and f/u with PCP -BP today 124/68  7. Fatigue  -Secondary to chemotherapy, which he has significantly impaired her activity level -We will further reduce chemotherapy dose today  8. Insomnia  -since last cycle chemo, some symptoms of anxiety -I will prescribe Xanax for her today  9. Goal of care discussion  -The patient understands the goal of care is palliative. -she is full code for now    Plan -I will reduce chemo dose irinotecan to 90 mg/m, continue vectibix -I will give Xanax for her insomnia -f/u in 2 weeks before next cycle chemo  No problem-specific Assessment & Plan notes found for this encounter.   No orders of the defined types were placed in this encounter.  All questions were answered. The patient knows to call the clinic with any problems, questions or concerns. No barriers to learning was  detected. I spent 20 minutes counseling the patient face to face. The total time spent in the appointment was 25 minutes and more than 50% was on counseling and review of test results  I, Noor Dweik am acting as scribe for Dr. Truitt Merle.  I have reviewed the above documentation for accuracy and completeness, and I agree with the above.      Truitt Merle, MD 07/13/2018

## 2018-07-13 ENCOUNTER — Encounter: Payer: Self-pay | Admitting: Hematology

## 2018-07-13 ENCOUNTER — Inpatient Hospital Stay (HOSPITAL_BASED_OUTPATIENT_CLINIC_OR_DEPARTMENT_OTHER): Payer: Medicare Other | Admitting: Hematology

## 2018-07-13 ENCOUNTER — Ambulatory Visit: Payer: Medicare Other

## 2018-07-13 ENCOUNTER — Inpatient Hospital Stay: Payer: Medicare Other

## 2018-07-13 ENCOUNTER — Ambulatory Visit: Payer: Medicare Other | Admitting: Nurse Practitioner

## 2018-07-13 ENCOUNTER — Inpatient Hospital Stay: Payer: Medicare Other | Attending: Hematology

## 2018-07-13 ENCOUNTER — Other Ambulatory Visit: Payer: Medicare Other

## 2018-07-13 VITALS — BP 124/68 | HR 61 | Temp 97.6°F | Resp 18 | Ht 66.0 in | Wt 174.8 lb

## 2018-07-13 DIAGNOSIS — C187 Malignant neoplasm of sigmoid colon: Secondary | ICD-10-CM | POA: Diagnosis not present

## 2018-07-13 DIAGNOSIS — C7802 Secondary malignant neoplasm of left lung: Secondary | ICD-10-CM | POA: Diagnosis not present

## 2018-07-13 DIAGNOSIS — R530 Neoplastic (malignant) related fatigue: Secondary | ICD-10-CM | POA: Insufficient documentation

## 2018-07-13 DIAGNOSIS — Z7189 Other specified counseling: Secondary | ICD-10-CM

## 2018-07-13 DIAGNOSIS — I10 Essential (primary) hypertension: Secondary | ICD-10-CM | POA: Diagnosis not present

## 2018-07-13 DIAGNOSIS — C787 Secondary malignant neoplasm of liver and intrahepatic bile duct: Secondary | ICD-10-CM

## 2018-07-13 DIAGNOSIS — Z5112 Encounter for antineoplastic immunotherapy: Secondary | ICD-10-CM | POA: Insufficient documentation

## 2018-07-13 DIAGNOSIS — I4891 Unspecified atrial fibrillation: Secondary | ICD-10-CM | POA: Diagnosis not present

## 2018-07-13 DIAGNOSIS — G4700 Insomnia, unspecified: Secondary | ICD-10-CM

## 2018-07-13 DIAGNOSIS — C7801 Secondary malignant neoplasm of right lung: Secondary | ICD-10-CM | POA: Diagnosis not present

## 2018-07-13 DIAGNOSIS — Z7982 Long term (current) use of aspirin: Secondary | ICD-10-CM | POA: Insufficient documentation

## 2018-07-13 DIAGNOSIS — C7800 Secondary malignant neoplasm of unspecified lung: Secondary | ICD-10-CM | POA: Diagnosis not present

## 2018-07-13 DIAGNOSIS — Z5111 Encounter for antineoplastic chemotherapy: Secondary | ICD-10-CM | POA: Diagnosis not present

## 2018-07-13 LAB — CMP (CANCER CENTER ONLY)
ALBUMIN: 3.3 g/dL — AB (ref 3.5–5.0)
ALT: 11 U/L (ref 0–44)
ANION GAP: 10 (ref 5–15)
AST: 13 U/L — ABNORMAL LOW (ref 15–41)
Alkaline Phosphatase: 75 U/L (ref 38–126)
BUN: 13 mg/dL (ref 8–23)
CO2: 25 mmol/L (ref 22–32)
Calcium: 9.3 mg/dL (ref 8.9–10.3)
Chloride: 106 mmol/L (ref 98–111)
Creatinine: 0.81 mg/dL (ref 0.44–1.00)
GFR, Estimated: >60 mL/min (ref >60)
GLUCOSE: 89 mg/dL (ref 70–99)
Potassium: 3.8 mmol/L (ref 3.5–5.1)
SODIUM: 141 mmol/L (ref 135–145)
Total Bilirubin: 0.4 mg/dL (ref 0.3–1.2)
Total Protein: 7.3 g/dL (ref 6.5–8.1)

## 2018-07-13 LAB — CBC WITH DIFFERENTIAL (CANCER CENTER ONLY)
Abs Immature Granulocytes: 0.03 10*3/uL (ref 0.00–0.07)
Basophils Absolute: 0 10*3/uL (ref 0.0–0.1)
Basophils Relative: 1 %
EOS ABS: 0.4 10*3/uL (ref 0.0–0.5)
Eosinophils Relative: 5 %
HEMATOCRIT: 33.8 % — AB (ref 36.0–46.0)
Hemoglobin: 10.5 g/dL — ABNORMAL LOW (ref 12.0–15.0)
Immature Granulocytes: 0 %
Lymphocytes Relative: 24 %
Lymphs Abs: 1.9 10*3/uL (ref 0.7–4.0)
MCH: 28 pg (ref 26.0–34.0)
MCHC: 31.1 g/dL (ref 30.0–36.0)
MCV: 90.1 fL (ref 80.0–100.0)
Monocytes Absolute: 1.1 10*3/uL — ABNORMAL HIGH (ref 0.1–1.0)
Monocytes Relative: 15 %
NEUTROS PCT: 55 %
Neutro Abs: 4.2 10*3/uL (ref 1.7–7.7)
PLATELETS: 404 10*3/uL — AB (ref 150–400)
RBC: 3.75 MIL/uL — ABNORMAL LOW (ref 3.87–5.11)
RDW: 14.8 % (ref 11.5–15.5)
WBC Count: 7.7 10*3/uL (ref 4.0–10.5)
nRBC: 0 % (ref 0.0–0.2)

## 2018-07-13 LAB — MAGNESIUM: Magnesium: 1.9 mg/dL (ref 1.7–2.4)

## 2018-07-13 MED ORDER — SODIUM CHLORIDE 0.9 % IV SOLN
Freq: Once | INTRAVENOUS | Status: AC
  Administered 2018-07-13: 10:00:00 via INTRAVENOUS
  Filled 2018-07-13: qty 250

## 2018-07-13 MED ORDER — PALONOSETRON HCL INJECTION 0.25 MG/5ML
0.2500 mg | Freq: Once | INTRAVENOUS | Status: AC
  Administered 2018-07-13: 0.25 mg via INTRAVENOUS

## 2018-07-13 MED ORDER — PALONOSETRON HCL INJECTION 0.25 MG/5ML
INTRAVENOUS | Status: AC
  Filled 2018-07-13: qty 5

## 2018-07-13 MED ORDER — IRINOTECAN HCL CHEMO INJECTION 100 MG/5ML
90.0000 mg/m2 | Freq: Once | INTRAVENOUS | Status: AC
  Administered 2018-07-13: 180 mg via INTRAVENOUS
  Filled 2018-07-13: qty 9

## 2018-07-13 MED ORDER — ALPRAZOLAM 0.25 MG PO TABS: 0.2500 mg | ORAL_TABLET | Freq: Every evening | ORAL | 0 refills | Status: DC | PRN

## 2018-07-13 MED ORDER — DEXAMETHASONE SODIUM PHOSPHATE 10 MG/ML IJ SOLN
INTRAMUSCULAR | Status: AC
  Filled 2018-07-13: qty 1

## 2018-07-13 MED ORDER — SODIUM CHLORIDE 0.9 % IV SOLN
6.5000 mg/kg | Freq: Once | INTRAVENOUS | Status: AC
  Administered 2018-07-13: 500 mg via INTRAVENOUS
  Filled 2018-07-13: qty 20

## 2018-07-13 MED ORDER — ATROPINE SULFATE 1 MG/ML IJ SOLN
0.5000 mg | Freq: Once | INTRAMUSCULAR | Status: AC | PRN
  Administered 2018-07-13: 0.5 mg via INTRAVENOUS

## 2018-07-13 MED ORDER — DEXAMETHASONE SODIUM PHOSPHATE 10 MG/ML IJ SOLN
10.0000 mg | Freq: Once | INTRAMUSCULAR | Status: AC
  Administered 2018-07-13: 10 mg via INTRAVENOUS

## 2018-07-13 MED ORDER — ATROPINE SULFATE 1 MG/ML IJ SOLN
INTRAMUSCULAR | Status: AC
  Filled 2018-07-13: qty 1

## 2018-07-13 NOTE — Patient Instructions (Signed)
[facility] Discharge Instructions for Patients Receiving Chemotherapy  Today you received the following chemotherapy agents: Panitumumab (Vectibix) and Irinotecan (Camptosar)  To help prevent nausea and vomiting after your treatment, we encourage you to take your nausea medication as directed. Received Aloxi during treatment today-->Take Compazine (not Zofran) for the next 3 days as needed.    If you develop nausea and vomiting that is not controlled by your nausea medication, call the clinic.   BELOW ARE SYMPTOMS THAT SHOULD BE REPORTED IMMEDIATELY:  *FEVER GREATER THAN 100.5 F  *CHILLS WITH OR WITHOUT FEVER  NAUSEA AND VOMITING THAT IS NOT CONTROLLED WITH YOUR NAUSEA MEDICATION  *UNUSUAL SHORTNESS OF BREATH  *UNUSUAL BRUISING OR BLEEDING  TENDERNESS IN MOUTH AND THROAT WITH OR WITHOUT PRESENCE OF ULCERS  *URINARY PROBLEMS  *BOWEL PROBLEMS  UNUSUAL RASH Items with * indicate a potential emergency and should be followed up as soon as possible.  Feel free to call the clinic should you have any questions or concerns. The clinic phone number is [phone].  Please show the [place] at check-in to the Emergency Department and triage nurse.

## 2018-07-26 NOTE — Progress Notes (Signed)
[place]   [phone] Fax:([phone]   Clinic Follow up Note   Patient Care Team: Moshe Cipro, MD as PCP - General (Internal Medicine) Alexis Frock, MD as Consulting Physician (Urology) Michael Boston, MD as Consulting Physician (General Surgery) Toma Deiters, MD as Referring Physician (Gastroenterology) 07/27/2018  CHIEF COMPLAINT: F/u on colon cancer    SUMMARY OF ONCOLOGIC HISTORY: Oncology History   Cancer Staging Cancer of left colon [facility] [place]) Staging form: Colon and Rectum, AJCC 8th Edition - Clinical stage from 09/04/2016: Stage IVB (cTX, cNX, pM1b) - Signed by Truitt Merle, MD on 11/12/2016       Cancer of sigmoid colon ([place])   09/02/2016 Procedure    Colonoscopy Diverticulosis (scattered). Pt has about 10 cm structure starting at 20 cm with abnormal mucosa. Biopsies taken.     09/02/2016 Pathology Results    MODERATELY DIFFERENTIATED ADENOCARCINOMA OF COLON AT 20 CM    09/04/2016 Initial Diagnosis    Cancer of left colon ([place])    09/06/2016 Imaging    CT CAP w Contrast 1. No acute abdominopelvic process.  2. Heterogenous hypodense area involving the medial edge of the left kidney with multiple small soft tissue like stranding involving the left pararenal space. Findings are suspicious for malignancy of the left kidney with small pararenal metastases. MRI abdomen with contrast should be considered to further evaluate.  3. Vague hypodensity involving the lateral segment of the liver. This can also be reevaluated with MRI.  4. Partially visualized 5 mm posterior right lower lobe nodular density. CT chest without contrast follow up is recommended.  5. Complex multilobular left ovarian lesion may reflect a cluster of cysts.    10/10/2016 Procedure    Operative Report by Dr. Tresa Moore on 10/18/16 Procedure: 1) Cystoscopy, left retrograde pyelogram interpretation. 2) Left diagnostic uteroscopy. 3) Insertion of left uteral stent; 5 x 24 Polaris  with tether. Findings: 1) Unremarkable bladder. 2) Unremarkable left retrograde pyelogram. 3) No evidence of intraluminal urothelial neoplasm whatsoever with insepction of the left kidney and ureter times several. 4) Successful placement of left ureteral stent, proximal in renal pelvis and distal in urinary bladder.    10/15/2016 Imaging    MRI abdomen pelvis 1. There are 2 enhancing hepatic lesions highly worrisome for metastatic disease, likely metastatic colon cancer. 2. The lesion of concern in the anterior suprahilar lip of the left kidney is also enhancing, worrisome for neoplasm. This has a somewhat atypical appearance for renal cell carcinoma and could reflect a metastasis as well. 3. New left-sided hydroureter with delayed contrast excretion consistent with distal ureteral obstruction. Specific etiology uncertain. 4. Complex cystic and solid left adnexal lesion could reflect cystic neoplasm, although does not appear aggressive or appear to reflect the cause of the distal left ureteral obstruction. 5. Sigmoid colon wall thickening could be related to reported newly diagnosed colon cancer.    11/06/2016 PET scan    PET 11/06/2016 IMPRESSION: 1. Hypermetabolic pulmonary and hepatic lesions, most consistent with metastatic colon cancer, with the hypermetabolic primary located in the sigmoid colon. 2. Left renal and left adnexal lesions, better seen and evaluated on 10/15/2016. 3. Persistent moderate left hydronephrosis. 4. Aortic atherosclerosis (ICD10-170.0).    11/11/2016 Pathology Results    Liver, needle/core biopsy, left lobe - METASTATIC ADENOCARCINOMA. The histologic features are consistent with a primary gastrointestinal adenocarcinoma.    05/19/2017 Imaging    CT Abdomen Pelvis, 05/19/2017 IMPRESSION: 1. Worsening pulmonary and hepatic metastatic disease. Primary sigmoid lesion is stable  to minimally enlarged. 2. Stable to slight enlargement of a left perinephric  mass, likely a metastasis. Renal cell carcinoma is not excluded. Associated pelvocaliectasis versus mild hydronephrosis. 3. Borderline left periaortic lymph nodes, slightly enlarged. 4. Cholelithiasis. 5.  Aortic atherosclerosis (ICD10-170.0). 6. Cystic and solid lesion in the left adnexa, as on prior exams, likely ovarian in origin.    06/06/2017 - 01/16/2018 Chemotherapy    IV antibody Vectibix every [redacted] weeks along with oral chemo Xeloda for 1 week on and 1 week off starting 06/06/17. Due to skin toxicities we reduced Xeloda to 2000 mg in the AM and 1551m in the PM 1 week on and 1 week off on 10/31/17. She had multiple treatment break due to hospitalization. Restarted treatment on 12/26/17. Chemo break 01/16/18-02/20/18    08/07/2017 - 08/16/2017 Hospital Admission    Admit date: 08/07/17 Admission diagnosis: Bowel Obstruction  Additional comments: She was admitted to the hospital on 08/07/17 after [facility] due to a bowel obstruction.  She had a sigmoid colon stent placement on 08/11/17. Bowel obstruction resolved.  CT scan in hospital showed great partial response to chemotherapy. She will continue chemotherapy given good overall tolerance.      08/07/2017 Imaging    CT AP W Contrast 08/07/17 IMPRESSION: Low colonic obstruction secondary to a rectosigmoid stricture at the site of the patient's prior colonic mass, which is no longer evident on CT. Mild upstream colonic wall thickening raises the possibility of superimposed colitis.  Improving hepatic metastases, as above. Left perirenal metastasis, decreased. Visualized pulmonary metastases at the lung bases are grossly unchanged.  3.5 cm mixed cystic/solid left ovarian mass is mildly decreased.     08/11/2017 Procedure    By Dr. MWatt Climes Flexible Sigmoidoscopy 08/11/17 IMPRESSION:   - Preparation of the colon was fair. - Likely malignant partially obstructing tumor in the recto-sigmoid colon versus  necrotic scarring. Injected with contrast proximal to obstruction to confirm proper position. Prosthesis placed. - Stool in the rectum. - No specimens collected.     10/22/2017 Imaging    CT CAP W Contrast 10/22/17 IMPRESSION: 1. Significant interval improvement and liver metastasis. 2. The left perinephric mass is slightly decreased in size in the interval. 3. No significant interval change in the appearance of multiple pulmonary metastasis. 4. Stable appearance of mediastinal adenopathy. 5. Gallstones 6.  Aortic Atherosclerosis (ICD10-I70.0). 7. Colonic stent remains in place without evidence for bowel obstruction.    12/01/2017 - 12/07/2017 Hospital Admission    Admit date: 12/01/17 Admission diagnosis: Bowel Obstruction and abdominal pain Additional comments: had flexible sigmoidoscopy by Dr. MWatt Climeson 12/04/17. Chemo was held during this time.     12/04/2017 Procedure    Flexible Sigmoidoscopy 12/04/17 IMPRESSION - Preparation of the colon was fair. - Stent in the colon. - Stricture in the distal sigmoid colon. Prosthesis placed. - No specimens collected.    02/18/2018 Imaging    CT CAP W Contrast 02/18/18 IMPRESSION: CT CHEST IMPRESSION 1. Bilateral pulmonary metastasis. Compared to the 12/01/2017 abdominal CT, mixed response to therapy. Compared to the 10/22/2017 chest CT, overall slight progression. 2. Similar thoracic adenopathy. 3.  Aortic Atherosclerosis (ICD10-I70.0). CT ABDOMEN AND PELVIS IMPRESSION 1. Since 12/01/2017, similar hepatic metastatic burden. Left renal/perirenal lesion is similar to minimally decreased in size. 2. Sigmoid colonic stent in place with resolution of colonic obstruction. 3. Similar mild soft tissue fullness within both ovaries.    02/20/2018 -  Chemotherapy    second line Irinotecan and  Victibix every 2 weeks on 02/20/2018     2020-01-318 Imaging    IMPRESSION: -Stable distal colonic stent with eccentric soft tissue thickening on the  right. No evidence of bowel obstruction. -Improving pulmonary and hepatic metastases, with index lesions as above. -Improving thoracic lymphadenopathy. -Stable fullness of the bilateral ovaries. -Stable enhancing lesion in the medial interpolar left kidney.      CURRENT THERAPY Second line Irinotecan and Vectibix every 2 weeks started on 02/20/2018   INTERVAL HISTORY: Mary [name] is a 84 y.o. female who is here for follow-up. Today, she is here at the infusion room with her family member. She is doing well and is able to sleep better. She managed last cycle very well and is willing to continue. Her BM are good and she uses Miralax twice a day. She denies abdominal pain. She is eating well. She denies fever.     Pertinent positives and negatives of review of systems are listed and detailed within the above HPI.  REVIEW OF SYSTEMS:   Constitutional: Denies fevers, chills or abnormal weight loss (+) sleeps better Eyes: Denies blurriness of vision Ears, nose, mouth, throat, and face: Denies mucositis or sore throat Respiratory: Denies cough, dyspnea or wheezes Cardiovascular: Denies palpitation, chest discomfort or lower extremity swelling Gastrointestinal:  Denies nausea, heartburn or change in bowel habits Skin: Denies abnormal skin rashes Lymphatics: Denies new lymphadenopathy or easy bruising Neurological:Denies numbness, tingling or new weaknesses Behavioral/Psych: Mood is stable, no new changes  All other systems were reviewed with the patient and are negative.  MEDICAL HISTORY:  Past Medical History:  Diagnosis Date  . Aneurysm of ascending aorta (HCC) 12/03/2016   3.7 cm by echo 10/2016  . Colon cancer [facility])    dx via biospy from colonoscopy-- malignant ---  currently having work-up done w/ dr gross (general surgeon) and coordinate surgery w/ urologsit for renal tumor  . Essential hypertension 10/31/2016  . GERD (gastroesophageal reflux disease)   . Glaucoma   . Hiatal  hernia   . HOH (hard of hearing)    bilateral even w/ hearing aids  . Hyperlipidemia   . Kidney tumor    left side  . Legally blind in right eye, as defined in [place]    due to macular degeneration  . Macular degeneration   . Nausea    intermittant due to colon mass  . Ovarian cyst   . Persistent atrial fibrillation 10/31/2016   on ASA only  . PONV (postoperative nausea and vomiting)   . Wears glasses   . Wears hearing aid    bilateral    SURGICAL HISTORY: Past Surgical History:  Procedure Laterality Date  . BREAST CYST EXCISION  1990   benign  . COLONIC STENT PLACEMENT N/A 12/04/2017   Procedure: COLONIC STENT PLACEMENT;  Surgeon: Clarene Essex, MD;  Location: [facility];  Service: Endoscopy;  Laterality: N/A;  . COLONIC STENT PLACEMENT N/A 06/25/2018   Procedure: COLONIC STENT PLACEMENT;  Surgeon: Clarene Essex, MD;  Location: [facility];  Service: Endoscopy;  Laterality: N/A;  . COLONOSCOPY  09/04/2016  . CYSTOSCOPY WITH RETROGRADE PYELOGRAM, URETEROSCOPY AND STENT PLACEMENT Left 10/10/2016   Procedure: CYSTOSCOPY WITH RETROGRADE PYELOGRAM, DIAGNOSTIC URETEROSCOPY  AND STENT PLACEMENT;  Surgeon: Alexis Frock, MD;  Location: [facility];  Service: Urology;  Laterality: Left;  . ESOPHAGOGASTRODUODENOSCOPY  11/07/2014  . FLEXIBLE SIGMOIDOSCOPY N/A 08/11/2017   Procedure: FLEXIBLE SIGMOIDOSCOPY;  Surgeon: Clarene Essex, MD;  Location: [facility];  Service: Endoscopy;  Laterality: N/A;  With Fluoroscopy for colonic stent placement for colonic stricture  . FLEXIBLE SIGMOIDOSCOPY N/A 12/04/2017   Procedure: FLEXIBLE SIGMOIDOSCOPY;  Surgeon: Clarene Essex, MD;  Location: [facility];  Service: Endoscopy;  Laterality: N/A;  . FLEXIBLE SIGMOIDOSCOPY N/A 06/25/2018   Procedure: FLEXIBLE SIGMOIDOSCOPY WITH STENT PLACEMENT;  Surgeon: Clarene Essex, MD;  Location: [facility];  Service: Endoscopy;  Laterality: N/A;  with stent placement  . NASAL SINUS SURGERY    . TONSILLECTOMY       I have reviewed the social history and family history with the patient and they are unchanged from previous note.  ALLERGIES:  is allergic to benazepril; tylenol [acetaminophen]; and bactrim [sulfamethoxazole-trimethoprim].  MEDICATIONS:  Current Outpatient Medications  Medication Sig Dispense Refill  . ALPRAZolam (XANAX) 0.25 MG tablet Take 1-2 tablets (0.25-0.5 mg total) by mouth at bedtime as needed for anxiety or sleep. 30 tablet 0  . amLODipine (NORVASC) 5 MG tablet Take 1 tablet (5 mg total) by mouth daily. 90 tablet 1  . bisacodyl (BISACODYL) 5 MG EC tablet Take 10 mg by mouth 2 (two) times daily.    . bisacodyl (DULCOLAX) 10 MG suppository Place 10 mg rectally daily as needed (severe constipation.).     Marland Kitchen diltiazem (CARDIZEM CD) 240 MG 24 hr capsule Take 1 capsule (240 mg total) by mouth daily. 90 capsule 1  . escitalopram (LEXAPRO) 10 MG tablet Take 10 mg by mouth daily.    . fexofenadine (ALLEGRA) 180 MG tablet Take 180 mg by mouth daily as needed for allergies.     Marland Kitchen latanoprost (XALATAN) 0.005 % ophthalmic solution Place 1 drop into both eyes at bedtime.    Marland Kitchen losartan (COZAAR) 100 MG tablet Take 1 tablet (100 mg total) by mouth daily. 90 tablet 0  . Multiple Vitamins-Minerals (VITEYES AREDS ADVANCED) CAPS Take 1 capsule by mouth daily.    Marland Kitchen omeprazole (PRILOSEC) 40 MG capsule Take 40 mg by mouth daily.    . ondansetron (ZOFRAN ODT) 4 MG disintegrating tablet Take 1 tablet (4 mg total) by mouth every 8 (eight) hours as needed for nausea or vomiting. 90 tablet 1  . oxyCODONE (OXY IR/ROXICODONE) 5 MG immediate release tablet Take 1 tablet (5 mg total) by mouth every 6 (six) hours as needed for moderate pain or severe pain. 90 tablet 0  . Polyethyl Glycol-Propyl Glycol (SYSTANE) 0.4-0.3 % SOLN Place 1 drop into both eyes 3 (three) times daily as needed (dry eyes).     . polyethylene glycol (MIRALAX / GLYCOLAX) packet Take 17 g by mouth daily as needed for moderate constipation. 30  each 0  . Potassium Chloride ER 20 MEQ TBCR Take 20 mEq by mouth daily. 30 tablet 0  . prochlorperazine (COMPAZINE) 10 MG tablet Take 1 tablet (10 mg total) by mouth every 6 (six) hours as needed for nausea or vomiting. 30 tablet 0  . sennosides-docusate sodium (SENOKOT-S) 8.6-50 MG tablet Take 1-2 tablets by mouth 3 (three) times daily.    . urea (CARMOL) 10 % cream Apply topically as needed. (Patient taking differently: Apply 1 application topically 3 (three) times daily as needed (for skin irritation). ) 71 g 0   Current Facility-Administered Medications  Medication Dose Route Frequency Provider Last Rate Last Dose  . sodium chloride flush (NS) 0.9 % injection 3 mL  3 mL Intravenous PRN Skeet Latch, MD        PHYSICAL EXAMINATION: ECOG PERFORMANCE STATUS: 2 - Symptomatic, <50% confined to bed  Vitals: BP  124/68 Pulse 61 RR 18  GENERAL:alert, no distress and comfortable SKIN: skin color, texture, turgor are normal, no rashes or significant lesions EYES: normal, Conjunctiva are pink and non-injected, sclera clear OROPHARYNX:no exudate, no erythema and lips, buccal mucosa, and tongue normal  NECK: supple, thyroid normal size, non-tender, without nodularity LYMPH:  no palpable lymphadenopathy in the cervical, axillary or inguinal LUNGS: clear to auscultation and percussion with normal breathing effort HEART: regular rate & rhythm and no murmurs and no lower extremity edema ABDOMEN:abdomen soft, non-tender and normal bowel sounds Musculoskeletal:no cyanosis of digits and no clubbing  NEURO: alert & oriented x 3 with fluent speech, no focal motor/sensory deficits  LABORATORY DATA:  I have reviewed the data as listed CBC Latest Ref Rng & Units 07/27/2018 07/13/2018 06/29/2018  WBC 4.0 - 10.5 K/uL 8.5 7.7 13.8(H)  Hemoglobin 12.0 - 15.0 g/dL 10.5(L) 10.5(L) 11.3(L)  Hematocrit 36.0 - 46.0 % 34.0(L) 33.8(L) 36.2  Platelets 150 - 400 K/uL 395 404(H) 407(H)     CMP Latest Ref Rng &  Units 07/27/2018 07/13/2018 06/29/2018  Glucose 70 - 99 mg/dL 98 89 88  BUN 8 - 23 mg/dL _0 Creatinine 0.44 - 1.00 mg/dL 0.80 0.81 0.73  Sodium 135 - 145 mmol/L 140 141 142  Potassium 3.5 - 5.1 mmol/L 3.7 3.8 3.6  Chloride 98 - 111 mmol/L 106 106 107  CO2 22 - 32 mmol/L _1 Calcium 8.9 - 10.3 mg/dL 9.2 9.3 8.9  Total Protein 6.5 - 8.1 g/dL 7.4 7.3 7.2  Total Bilirubin 0.3 - 1.2 mg/dL 0.3 0.4 0.3  Alkaline Phos 38 - 126 U/L 99 75 85  AST 15 - 41 U/L 16 13(L) 18  ALT 0 - 44 U/L _2 RADIOGRAPHIC STUDIES: I have personally reviewed the radiological images as listed and agreed with the findings in the report. No results found.   ASSESSMENT & PLAN:  Mary [name] is a 84 y.o. female with history of  1. Adenocarcinoma of sigmoid colon, TxNxM1 with liver and lung mets, stage IV, KRAS/NRS wild type, MSI-stable -Diagnosed in 08/2016. She had disease progression on first line Xeloda and Vectibix. Currently on second line Irinotecan and Vectibix every 2 weeks. She did not tolerated very well with significant fatigue, and chemo dose was reduced to 87m/m2  -She tolerates the low-dose irinotecan much better.  -She developed partial bowel obstruction after along chemo break and required multiple stent placements. -Labs reviewed, CBC showed Hg 10.5. CMP is overall WNLs. Mg 1.8. CEA pending.  Adequate for treatment, will proceed irinotecan and vectibix today, continue every 2 weeks. -Plan to repeat staging scan in February  2.  Recurrent partial bowel obstruction secondary to sigmoid colon mass, status post stent placement on 08/11/17, 12/04/2017 and 06/25/2018 -Related to her tumor. -She was treated with multiple stent placements by Dr. MWatt Climes She is open to surgery if no other option - Will monitor closely -she is on laxities, we previously discussed low residue diet.  3. Left renal mass  -Possible RCC or metastasis. -Last CT from 06/16/2018 showed stable mass.  4.  Complex left ovarian cyst -06/16/18 CT showed stable bilateral adnexal masses, 4.3 x3.0 on left, 3.8 x2.6 on right -Will monitor  5. Atrial Fibrillation - Currently on baby aspirin  6. HTN -f/u with PCP and continue recommended medications  -BP today 124/68  7. Fatigue  -Secondary to chemotherapy, which he has significantly impaired her activity level -  I reduced irinotecan dose last cycle, she tolerated much better, her fatigue has improved.  She is able to function well at home.  8. Insomnia  -since last cycle chemo, some symptoms of anxiety -I prescribed Xanax previously -She is sleeping better now.  9. Goal of care discussion  -The patient understands the goal of care is palliative. -she is full code for now   Plan  -Lab reviewed, adequate for treatment, will proceed irinotecan 90 mg/m, and vectibix  -f/u in 2 weeks before next cycle   No problem-specific Assessment & Plan notes found for this encounter.   No orders of the defined types were placed in this encounter.  All questions were answered. The patient knows to call the clinic with any problems, questions or concerns. No barriers to learning was detected.  I spent 20 minutes counseling the patient face to face. The total time spent in the appointment was 25 minutes and more than 50% was on counseling and review of test results  I, Noor Dweik am acting as scribe for Dr. Truitt Merle.  I have reviewed the above documentation for accuracy and completeness, and I agree with the above.     Truitt Merle, MD 07/27/2018

## 2018-07-27 ENCOUNTER — Inpatient Hospital Stay: Payer: Medicare Other

## 2018-07-27 ENCOUNTER — Inpatient Hospital Stay (HOSPITAL_BASED_OUTPATIENT_CLINIC_OR_DEPARTMENT_OTHER): Payer: Medicare Other | Admitting: Hematology

## 2018-07-27 VITALS — BP 105/51 | HR 62 | Temp 98.0°F | Resp 18

## 2018-07-27 DIAGNOSIS — C7800 Secondary malignant neoplasm of unspecified lung: Secondary | ICD-10-CM

## 2018-07-27 DIAGNOSIS — R530 Neoplastic (malignant) related fatigue: Secondary | ICD-10-CM

## 2018-07-27 DIAGNOSIS — I4891 Unspecified atrial fibrillation: Secondary | ICD-10-CM | POA: Diagnosis not present

## 2018-07-27 DIAGNOSIS — Z5112 Encounter for antineoplastic immunotherapy: Secondary | ICD-10-CM | POA: Diagnosis not present

## 2018-07-27 DIAGNOSIS — C7802 Secondary malignant neoplasm of left lung: Secondary | ICD-10-CM | POA: Diagnosis not present

## 2018-07-27 DIAGNOSIS — C187 Malignant neoplasm of sigmoid colon: Secondary | ICD-10-CM

## 2018-07-27 DIAGNOSIS — I10 Essential (primary) hypertension: Secondary | ICD-10-CM

## 2018-07-27 DIAGNOSIS — Z7189 Other specified counseling: Secondary | ICD-10-CM | POA: Diagnosis not present

## 2018-07-27 DIAGNOSIS — N289 Disorder of kidney and ureter, unspecified: Secondary | ICD-10-CM

## 2018-07-27 DIAGNOSIS — C787 Secondary malignant neoplasm of liver and intrahepatic bile duct: Secondary | ICD-10-CM

## 2018-07-27 DIAGNOSIS — C7801 Secondary malignant neoplasm of right lung: Secondary | ICD-10-CM | POA: Diagnosis not present

## 2018-07-27 DIAGNOSIS — F419 Anxiety disorder, unspecified: Secondary | ICD-10-CM | POA: Diagnosis not present

## 2018-07-27 DIAGNOSIS — Z5111 Encounter for antineoplastic chemotherapy: Secondary | ICD-10-CM | POA: Diagnosis not present

## 2018-07-27 LAB — CMP (CANCER CENTER ONLY)
ALT: 25 U/L (ref 0–44)
AST: 16 U/L (ref 15–41)
Albumin: 3.4 g/dL — ABNORMAL LOW (ref 3.5–5.0)
Alkaline Phosphatase: 99 U/L (ref 38–126)
Anion gap: 10 (ref 5–15)
BUN: 11 mg/dL (ref 8–23)
CO2: 24 mmol/L (ref 22–32)
Calcium: 9.2 mg/dL (ref 8.9–10.3)
Chloride: 106 mmol/L (ref 98–111)
Creatinine: 0.8 mg/dL (ref 0.44–1.00)
GFR, Est AFR Am: >60 mL/min (ref >60)
GFR, Estimated: >60 mL/min (ref >60)
Glucose, Bld: 98 mg/dL (ref 70–99)
Potassium: 3.7 mmol/L (ref 3.5–5.1)
Sodium: 140 mmol/L (ref 135–145)
Total Bilirubin: 0.3 mg/dL (ref 0.3–1.2)
Total Protein: 7.4 g/dL (ref 6.5–8.1)

## 2018-07-27 LAB — CBC WITH DIFFERENTIAL (CANCER CENTER ONLY)
Abs Immature Granulocytes: 0.04 10*3/uL (ref 0.00–0.07)
BASOS PCT: 1 %
Basophils Absolute: 0.1 10*3/uL (ref 0.0–0.1)
EOS ABS: 0.3 10*3/uL (ref 0.0–0.5)
Eosinophils Relative: 4 %
HCT: 34 % — ABNORMAL LOW (ref 36.0–46.0)
Hemoglobin: 10.5 g/dL — ABNORMAL LOW (ref 12.0–15.0)
Immature Granulocytes: 1 %
Lymphocytes Relative: 21 %
Lymphs Abs: 1.8 10*3/uL (ref 0.7–4.0)
MCH: 27.3 pg (ref 26.0–34.0)
MCHC: 30.9 g/dL (ref 30.0–36.0)
MCV: 88.5 fL (ref 80.0–100.0)
Monocytes Absolute: 1.2 10*3/uL — ABNORMAL HIGH (ref 0.1–1.0)
Monocytes Relative: 14 %
Neutro Abs: 5 10*3/uL (ref 1.7–7.7)
Neutrophils Relative %: 59 %
Platelet Count: 395 10*3/uL (ref 150–400)
RBC: 3.84 MIL/uL — ABNORMAL LOW (ref 3.87–5.11)
RDW: 14.8 % (ref 11.5–15.5)
WBC Count: 8.5 10*3/uL (ref 4.0–10.5)
nRBC: 0 % (ref 0.0–0.2)

## 2018-07-27 LAB — CEA (IN HOUSE-CHCC): CEA (CHCC-In House): 3.27 ng/mL (ref 0.00–5.00)

## 2018-07-27 LAB — MAGNESIUM: Magnesium: 1.8 mg/dL (ref 1.7–2.4)

## 2018-07-27 MED ORDER — SODIUM CHLORIDE 0.9 % IV SOLN
6.5000 mg/kg | Freq: Once | INTRAVENOUS | Status: AC
  Administered 2018-07-27: 500 mg via INTRAVENOUS
  Filled 2018-07-27: qty 20

## 2018-07-27 MED ORDER — SODIUM CHLORIDE 0.9 % IV SOLN
Freq: Once | INTRAVENOUS | Status: AC
  Administered 2018-07-27: 10:00:00 via INTRAVENOUS
  Filled 2018-07-27: qty 250

## 2018-07-27 MED ORDER — ATROPINE SULFATE 1 MG/ML IJ SOLN
INTRAMUSCULAR | Status: AC
  Filled 2018-07-27: qty 1

## 2018-07-27 MED ORDER — DEXAMETHASONE SODIUM PHOSPHATE 10 MG/ML IJ SOLN
10.0000 mg | Freq: Once | INTRAMUSCULAR | Status: AC
  Administered 2018-07-27: 10 mg via INTRAVENOUS

## 2018-07-27 MED ORDER — PALONOSETRON HCL INJECTION 0.25 MG/5ML
INTRAVENOUS | Status: AC
  Filled 2018-07-27: qty 5

## 2018-07-27 MED ORDER — DEXAMETHASONE SODIUM PHOSPHATE 10 MG/ML IJ SOLN
INTRAMUSCULAR | Status: AC
  Filled 2018-07-27: qty 1

## 2018-07-27 MED ORDER — ATROPINE SULFATE 1 MG/ML IJ SOLN
0.5000 mg | Freq: Once | INTRAMUSCULAR | Status: AC | PRN
  Administered 2018-07-27: 0.5 mg via INTRAVENOUS

## 2018-07-27 MED ORDER — IRINOTECAN HCL CHEMO INJECTION 100 MG/5ML
90.0000 mg/m2 | Freq: Once | INTRAVENOUS | Status: AC
  Administered 2018-07-27: 180 mg via INTRAVENOUS
  Filled 2018-07-27: qty 9

## 2018-07-27 MED ORDER — PALONOSETRON HCL INJECTION 0.25 MG/5ML
0.2500 mg | Freq: Once | INTRAVENOUS | Status: AC
  Administered 2018-07-27: 0.25 mg via INTRAVENOUS

## 2018-07-27 NOTE — Patient Instructions (Signed)
[facility] Discharge Instructions for Patients Receiving Chemotherapy  Today you received the following chemotherapy agents: Panitumumab (Vectibix) and Irinotecan (Camptosar)  To help prevent nausea and vomiting after your treatment, we encourage you to take your nausea medication as directed. Received Aloxi during treatment today-->Take Compazine (not Zofran) for the next 3 days as needed.    If you develop nausea and vomiting that is not controlled by your nausea medication, call the clinic.   BELOW ARE SYMPTOMS THAT SHOULD BE REPORTED IMMEDIATELY:  *FEVER GREATER THAN 100.5 F  *CHILLS WITH OR WITHOUT FEVER  NAUSEA AND VOMITING THAT IS NOT CONTROLLED WITH YOUR NAUSEA MEDICATION  *UNUSUAL SHORTNESS OF BREATH  *UNUSUAL BRUISING OR BLEEDING  TENDERNESS IN MOUTH AND THROAT WITH OR WITHOUT PRESENCE OF ULCERS  *URINARY PROBLEMS  *BOWEL PROBLEMS  UNUSUAL RASH Items with * indicate a potential emergency and should be followed up as soon as possible.  Feel free to call the clinic should you have any questions or concerns. The clinic phone number is [phone].  Please show the [place] at check-in to the Emergency Department and triage nurse.

## 2018-07-28 ENCOUNTER — Encounter: Payer: Self-pay | Admitting: Hematology

## 2018-07-30 ENCOUNTER — Telehealth: Payer: Self-pay

## 2018-07-30 ENCOUNTER — Telehealth: Payer: Self-pay | Admitting: Hematology

## 2018-07-30 NOTE — Telephone Encounter (Signed)
Patient's daughter calls stating the appointment on 1/14 needs to be moved preferably to Monday 1/13 starting at 8:00 am, they need early appointments.  Okay to leave her a voice message on her phone.  Scheduling message was sent.

## 2018-07-30 NOTE — Telephone Encounter (Signed)
Printed calendar and avs. °

## 2018-07-31 ENCOUNTER — Telehealth: Payer: Self-pay | Admitting: Hematology

## 2018-07-31 NOTE — Telephone Encounter (Signed)
R/s appt per 12/19 sch message - pt is aware of new appt date and time

## 2018-08-07 NOTE — Progress Notes (Signed)
Mary [name]   [phone] [phone]   Clinic Follow up Note   Patient Care Team: Mary Cipro, MD as PCP - General (Internal Medicine) Mary Frock, MD as Consulting Physician (Urology) Mary Boston, MD as Consulting Physician (General Surgery) Mary Deiters, MD as Referring Physician (Gastroenterology)  Date of Service:  08/10/2018  CHIEF COMPLAINT: F/u of colon cancer  SUMMARY OF ONCOLOGIC HISTORY: Oncology History   Cancer Staging Cancer of left colon [facility]) Staging form: Colon and Rectum, AJCC 8th Edition - Clinical stage from 09/04/2016: Stage IVB (cTX, cNX, pM1b) - Signed by Truitt Merle, MD on 11/12/2016       Cancer of sigmoid colon ([place])   09/02/2016 Procedure    Colonoscopy Diverticulosis (scattered). Pt has about 10 cm structure starting at 20 cm with abnormal mucosa. Biopsies taken.     09/02/2016 Pathology Results    MODERATELY DIFFERENTIATED ADENOCARCINOMA OF COLON AT 20 CM    09/04/2016 Initial Diagnosis    Cancer of left colon ([place])    09/06/2016 Imaging    CT CAP w Contrast 1. No acute abdominopelvic process.  2. Heterogenous hypodense area involving the medial edge of the left kidney with multiple small soft tissue like stranding involving the left pararenal space. Findings are suspicious for malignancy of the left kidney with small pararenal metastases. MRI abdomen with contrast should be considered to further evaluate.  3. Vague hypodensity involving the lateral segment of the liver. This can also be reevaluated with MRI.  4. Partially visualized 5 mm posterior right lower lobe nodular density. CT chest without contrast follow up is recommended.  5. Complex multilobular left ovarian lesion may reflect a cluster of cysts.    10/10/2016 Procedure    Operative Report by Dr. Tresa Moore on 10/18/16 Procedure: 1) Cystoscopy, left retrograde pyelogram interpretation. 2) Left diagnostic uteroscopy. 3) Insertion of left uteral  stent; 5 x 24 Polaris with tether. Findings: 1) Unremarkable bladder. 2) Unremarkable left retrograde pyelogram. 3) No evidence of intraluminal urothelial neoplasm whatsoever with insepction of the left kidney and ureter times several. 4) Successful placement of left ureteral stent, proximal in renal pelvis and distal in urinary bladder.    10/15/2016 Imaging    MRI abdomen pelvis 1. There are 2 enhancing hepatic lesions highly worrisome for metastatic disease, likely metastatic colon cancer. 2. The lesion of concern in the anterior suprahilar lip of the left kidney is also enhancing, worrisome for neoplasm. This has a somewhat atypical appearance for renal cell carcinoma and could reflect a metastasis as well. 3. New left-sided hydroureter with delayed contrast excretion consistent with distal ureteral obstruction. Specific etiology uncertain. 4. Complex cystic and solid left adnexal lesion could reflect cystic neoplasm, although does not appear aggressive or appear to reflect the cause of the distal left ureteral obstruction. 5. Sigmoid colon wall thickening could be related to reported newly diagnosed colon cancer.    11/06/2016 PET scan    PET 11/06/2016 IMPRESSION: 1. Hypermetabolic pulmonary and hepatic lesions, most consistent with metastatic colon cancer, with the hypermetabolic primary located in the sigmoid colon. 2. Left renal and left adnexal lesions, better seen and evaluated on 10/15/2016. 3. Persistent moderate left hydronephrosis. 4. Aortic atherosclerosis (ICD10-170.0).    11/11/2016 Pathology Results    Liver, needle/core biopsy, left lobe - METASTATIC ADENOCARCINOMA. The histologic features are consistent with a primary gastrointestinal adenocarcinoma.    05/19/2017 Imaging    CT Abdomen Pelvis, 05/19/2017 IMPRESSION: 1. Worsening pulmonary and hepatic metastatic disease. Primary sigmoid  lesion is stable to minimally enlarged. 2. Stable to slight enlargement  of a left perinephric mass, likely a metastasis. Renal cell carcinoma is not excluded. Associated pelvocaliectasis versus mild hydronephrosis. 3. Borderline left periaortic lymph nodes, slightly enlarged. 4. Cholelithiasis. 5.  Aortic atherosclerosis (ICD10-170.0). 6. Cystic and solid lesion in the left adnexa, as on prior exams, likely ovarian in origin.    06/06/2017 - 01/16/2018 Chemotherapy    IV antibody Vectibix every [redacted] weeks along with oral chemo Xeloda for 1 week on and 1 week off starting 06/06/17. Due to skin toxicities we reduced Xeloda to 2000 mg in the AM and 1547m in the PM 1 week on and 1 week off on 10/31/17. She had multiple treatment break due to hospitalization. Restarted treatment on 12/26/17. Chemo break 01/16/18-02/20/18    08/07/2017 - 08/16/2017 Hospital Admission    Admit date: 08/07/17 Admission diagnosis: Bowel Obstruction  Additional comments: She was admitted to the hospital on 08/07/17 after [facility] due to a bowel obstruction.  She had a sigmoid colon stent placement on 08/11/17. Bowel obstruction resolved.  CT scan in hospital showed great partial response to chemotherapy. She will continue chemotherapy given good overall tolerance.      08/07/2017 Imaging    CT AP W Contrast 08/07/17 IMPRESSION: Low colonic obstruction secondary to a rectosigmoid stricture at the site of the patient's prior colonic mass, which is no longer evident on CT. Mild upstream colonic wall thickening raises the possibility of superimposed colitis.  Improving hepatic metastases, as above. Left perirenal metastasis, decreased. Visualized pulmonary metastases at the lung bases are grossly unchanged.  3.5 cm mixed cystic/solid left ovarian mass is mildly decreased.     08/11/2017 Procedure    By Dr. MWatt Climes Flexible Sigmoidoscopy 08/11/17 IMPRESSION:   - Preparation of the colon was fair. - Likely malignant partially obstructing tumor in the  recto-sigmoid colon versus necrotic scarring. Injected with contrast proximal to obstruction to confirm proper position. Prosthesis placed. - Stool in the rectum. - No specimens collected.     10/22/2017 Imaging    CT CAP W Contrast 10/22/17 IMPRESSION: 1. Significant interval improvement and liver metastasis. 2. The left perinephric mass is slightly decreased in size in the interval. 3. No significant interval change in the appearance of multiple pulmonary metastasis. 4. Stable appearance of mediastinal adenopathy. 5. Gallstones 6.  Aortic Atherosclerosis (ICD10-I70.0). 7. Colonic stent remains in place without evidence for bowel obstruction.    12/01/2017 - 12/07/2017 Hospital Admission    Admit date: 12/01/17 Admission diagnosis: Bowel Obstruction and abdominal pain Additional comments: had flexible sigmoidoscopy by Dr. MWatt Climeson 12/04/17. Chemo was held during this time.     12/04/2017 Procedure    Flexible Sigmoidoscopy 12/04/17 IMPRESSION - Preparation of the colon was fair. - Stent in the colon. - Stricture in the distal sigmoid colon. Prosthesis placed. - No specimens collected.    02/18/2018 Imaging    CT CAP W Contrast 02/18/18 IMPRESSION: CT CHEST IMPRESSION 1. Bilateral pulmonary metastasis. Compared to the 12/01/2017 abdominal CT, mixed response to therapy. Compared to the 10/22/2017 chest CT, overall slight progression. 2. Similar thoracic adenopathy. 3.  Aortic Atherosclerosis (ICD10-I70.0). CT ABDOMEN AND PELVIS IMPRESSION 1. Since 12/01/2017, similar hepatic metastatic burden. Left renal/perirenal lesion is similar to minimally decreased in size. 2. Sigmoid colonic stent in place with resolution of colonic obstruction. 3. Similar mild soft tissue fullness within both ovaries.    02/20/2018 -  Chemotherapy    second  line Irinotecan and Victibix every 2 weeks on 02/20/2018     2020/02/1618 Imaging    IMPRESSION: -Stable distal colonic stent with eccentric soft  tissue thickening on the right. No evidence of bowel obstruction. -Improving pulmonary and hepatic metastases, with index lesions as above. -Improving thoracic lymphadenopathy. -Stable fullness of the bilateral ovaries. -Stable enhancing lesion in the medial interpolar left kidney.       CURRENT THERAPY:  Second line Irinotecan and Vectibix every 2 weeks started on 02/20/2018  INTERVAL HISTORY:  Mary [name] is here for a follow up and Irinotecan/Vectibix. She presents to the infusion room today with daughter-in-law Mary [name]. She notes she is still able to take care of herself and shop as needed. She notes this can be strenuous at times. She notes she is up most of the time and will rest as needed. She has not been taking naps during the day like she use to. She mostly reads and watch TV during the day. She does laundry and cooking but gets others to do more active chores at home. She denies abdominal pain post stent placement. She denies LE edema. She does note skin rash of her face and b/l arms .  She notes her rhinorrhea has improved.   Mary [name] came to my office before I visited with the patient today.  She voiced serious concern, that patient is very fatigued, not able to do house work as she use to do such as cleaning, Mary [name] is very concerned about her poor quality of life.  REVIEW OF SYSTEMS:   Constitutional: Denies fevers, chills or abnormal weight loss (+) fair energy  Eyes: Denies blurriness of vision Ears, nose, mouth, throat, and face: Denies mucositis or sore throat (+) rhinorrhea improved  Respiratory: Denies cough, dyspnea or wheezes Cardiovascular: Denies palpitation, chest discomfort or lower extremity swelling Gastrointestinal:  Denies nausea, heartburn or change in bowel habits  Skin: Denies abnormal skin rashes (+) skin rash from Vectabix of face, and b/l arms  Lymphatics: Denies new lymphadenopathy or easy bruising Neurological:Denies numbness, tingling or  new weaknesses Behavioral/Psych: Mood is stable, no new changes  All other systems were reviewed with the patient and are negative.  MEDICAL HISTORY:  Past Medical History:  Diagnosis Date  . Aneurysm of ascending aorta ([facility]) 12/03/2016   3.7 cm by echo 10/2016  . Colon cancer [facility])    dx via biospy from colonoscopy-- malignant ---  currently having work-up done w/ dr gross (general surgeon) and coordinate surgery w/ urologsit for renal tumor  . Essential hypertension 10/31/2016  . GERD (gastroesophageal reflux disease)   . Glaucoma   . Hiatal hernia   . HOH (hard of hearing)    bilateral even w/ hearing aids  . Hyperlipidemia   . Kidney tumor    left side  . Legally blind in right eye, as defined in [place]    due to macular degeneration  . Macular degeneration   . Nausea    intermittant due to colon mass  . Ovarian cyst   . Persistent atrial fibrillation 10/31/2016   on ASA only  . PONV (postoperative nausea and vomiting)   . Wears glasses   . Wears hearing aid    bilateral    SURGICAL HISTORY: Past Surgical History:  Procedure Laterality Date  . BREAST CYST EXCISION  1990   benign  . COLONIC STENT PLACEMENT N/A 12/04/2017   Procedure: COLONIC STENT PLACEMENT;  Surgeon: Clarene Essex, MD;  Location: [facility];  Service: Endoscopy;  Laterality: N/A;  . COLONIC STENT PLACEMENT N/A 06/25/2018   Procedure: COLONIC STENT PLACEMENT;  Surgeon: Clarene Essex, MD;  Location: [facility];  Service: Endoscopy;  Laterality: N/A;  . COLONOSCOPY  09/04/2016  . CYSTOSCOPY WITH RETROGRADE PYELOGRAM, URETEROSCOPY AND STENT PLACEMENT Left 10/10/2016   Procedure: CYSTOSCOPY WITH RETROGRADE PYELOGRAM, DIAGNOSTIC URETEROSCOPY  AND STENT PLACEMENT;  Surgeon: Mary Frock, MD;  Location[facility];  Service: Urology;  Laterality: Left;  . ESOPHAGOGASTRODUODENOSCOPY  11/07/2014  . FLEXIBLE SIGMOIDOSCOPY N/A 08/11/2017   Procedure: FLEXIBLE SIGMOIDOSCOPY;  Surgeon: Clarene Essex, MD;   Location: [facility];  Service: Endoscopy;  Laterality: N/A;  With Fluoroscopy for colonic stent placement for colonic stricture  . FLEXIBLE SIGMOIDOSCOPY N/A 12/04/2017   Procedure: FLEXIBLE SIGMOIDOSCOPY;  Surgeon: Clarene Essex, MD;  Location: [facility];  Service: Endoscopy;  Laterality: N/A;  . FLEXIBLE SIGMOIDOSCOPY N/A 06/25/2018   Procedure: FLEXIBLE SIGMOIDOSCOPY WITH STENT PLACEMENT;  Surgeon: Clarene Essex, MD;  Location: [facility];  Service: Endoscopy;  Laterality: N/A;  with stent placement  . NASAL SINUS SURGERY    . TONSILLECTOMY      I have reviewed the social history and family history with the patient and they are unchanged from previous note.  ALLERGIES:  is allergic to benazepril; tylenol [acetaminophen]; and bactrim [sulfamethoxazole-trimethoprim].  MEDICATIONS:  Current Outpatient Medications  Medication Sig Dispense Refill  . ALPRAZolam (XANAX) 0.25 MG tablet Take 1-2 tablets (0.25-0.5 mg total) by mouth at bedtime as needed for anxiety or sleep. 30 tablet 0  . amLODipine (NORVASC) 5 MG tablet Take 1 tablet (5 mg total) by mouth daily. 90 tablet 1  . bisacodyl (BISACODYL) 5 MG EC tablet Take 10 mg by mouth 2 (two) times daily.    . bisacodyl (DULCOLAX) 10 MG suppository Place 10 mg rectally daily as needed (severe constipation.).     Marland Kitchen diltiazem (CARDIZEM CD) 240 MG 24 hr capsule Take 1 capsule (240 mg total) by mouth daily. 90 capsule 1  . escitalopram (LEXAPRO) 10 MG tablet Take 10 mg by mouth daily.    . fexofenadine (ALLEGRA) 180 MG tablet Take 180 mg by mouth daily as needed for allergies.     Marland Kitchen latanoprost (XALATAN) 0.005 % ophthalmic solution Place 1 drop into both eyes at bedtime.    Marland Kitchen losartan (COZAAR) 100 MG tablet Take 1 tablet (100 mg total) by mouth daily. 90 tablet 0  . Multiple Vitamins-Minerals (VITEYES AREDS ADVANCED) CAPS Take 1 capsule by mouth daily.    Marland Kitchen omeprazole (PRILOSEC) 40 MG capsule Take 40 mg by mouth daily.    . ondansetron (ZOFRAN  ODT) 4 MG disintegrating tablet Take 1 tablet (4 mg total) by mouth every 8 (eight) hours as needed for nausea or vomiting. 90 tablet 1  . oxyCODONE (OXY IR/ROXICODONE) 5 MG immediate release tablet Take 1 tablet (5 mg total) by mouth every 6 (six) hours as needed for moderate pain or severe pain. 90 tablet 0  . Polyethyl Glycol-Propyl Glycol (SYSTANE) 0.4-0.3 % SOLN Place 1 drop into both eyes 3 (three) times daily as needed (dry eyes).     . polyethylene glycol (MIRALAX / GLYCOLAX) packet Take 17 g by mouth daily as needed for moderate constipation. 30 each 0  . Potassium Chloride ER 20 MEQ TBCR Take 20 mEq by mouth daily. 30 tablet 0  . prochlorperazine (COMPAZINE) 10 MG tablet Take 1 tablet (10 mg total) by mouth every 6 (six) hours as needed for nausea or vomiting. 30 tablet 0  .  sennosides-docusate sodium (SENOKOT-S) 8.6-50 MG tablet Take 1-2 tablets by mouth 3 (three) times daily.    . urea (CARMOL) 10 % cream Apply topically as needed. (Patient taking differently: Apply 1 application topically 3 (three) times daily as needed (for skin irritation). ) 71 g 0   Current Facility-Administered Medications  Medication Dose Route Frequency Provider Last Rate Last Dose  . sodium chloride flush (NS) 0.9 % injection 3 mL  3 mL Intravenous PRN Skeet Latch, MD       Facility-Administered Medications Ordered in Other Visits  Medication Dose Route Frequency Provider Last Rate Last Dose  . atropine injection 0.5 mg  0.5 mg Intravenous Once PRN Truitt Merle, MD      . irinotecan (CAMPTOSAR) 180 mg in dextrose 5 % 500 mL chemo infusion  90 mg/m2 (Treatment Plan Recorded) Intravenous Once Truitt Merle, MD      . panitumumab (VECTIBIX) 500 mg in sodium chloride 0.9 % 100 mL chemo infusion  6.5 mg/kg (Treatment Plan Recorded) Intravenous Once Truitt Merle, MD        PHYSICAL EXAMINATION: ECOG PERFORMANCE STATUS: 3 - Symptomatic, >50% confined to bed  There were no vitals filed for this visit. There were no  vitals filed for this visit.  GENERAL:alert, no distress and comfortable SKIN: skin color, texture, turgor are normal, no rashes or significant lesions EYES: normal, Conjunctiva are pink and non-injected, sclera clear OROPHARYNX:no exudate, no erythema and lips, buccal mucosa, and tongue normal  NECK: supple, thyroid normal size, non-tender, without nodularity LYMPH:  no palpable lymphadenopathy in the cervical, axillary or inguinal LUNGS: clear to auscultation and percussion with normal breathing effort HEART: regular rate & rhythm and no murmurs and no lower extremity edema ABDOMEN:abdomen soft, non-tender and normal bowel sounds Musculoskeletal:no cyanosis of digits and no clubbing  NEURO: alert & oriented x 3 with fluent speech, no focal motor/sensory deficits  LABORATORY DATA:  I have reviewed the data as listed CBC Latest Ref Rng & Units 08/10/2018 07/27/2018 07/13/2018  WBC 4.0 - 10.5 K/uL 8.5 8.5 7.7  Hemoglobin 12.0 - 15.0 g/dL 10.6(L) 10.5(L) 10.5(L)  Hematocrit 36.0 - 46.0 % 33.6(L) 34.0(L) 33.8(L)  Platelets 150 - 400 K/uL 409(H) 395 404(H)     CMP Latest Ref Rng & Units 08/10/2018 07/27/2018 07/13/2018  Glucose 70 - 99 mg/dL 100(H) 98 89  BUN 8 - 23 mg/dL 16 11 13   Creatinine 0.44 - 1.00 mg/dL 0.85 0.80 0.81  Sodium 135 - 145 mmol/L 138 140 141  Potassium 3.5 - 5.1 mmol/L 4.2 3.7 3.8  Chloride 98 - 111 mmol/L 105 106 106  CO2 22 - 32 mmol/L 25 24 25   Calcium 8.9 - 10.3 mg/dL 9.0 9.2 9.3  Total Protein 6.5 - 8.1 g/dL 6.9 7.4 7.3  Total Bilirubin 0.3 - 1.2 mg/dL 0.4 0.3 0.4  Alkaline Phos 38 - 126 U/L 77 99 75  AST 15 - 41 U/L 13(L) 16 13(L)  ALT 0 - 44 U/L 14 25 11       RADIOGRAPHIC STUDIES: I have personally reviewed the radiological images as listed and agreed with the findings in the report. No results found.   ASSESSMENT & PLAN:  LEYDA VANDERWERF is a 84 y.o. female with   1. Adenocarcinoma of sigmoid colon, TxNxM1 with liver and lung mets, stage IV,  KRAS/NRS wild type, MSI-stable -Diagnosed in 08/2016. She had disease progression on first line Xeloda and Vectibix. Currently on second lineIrinotecan and Vectibix every 2 weeks. She did not  tolerated very well with significant fatigue, and chemo dose was reduced to 84m/m2.  -She developed partial bowel obstruction after along chemo break and required multiple stent placements. -Her daughter-in-law and I had a discussion at length about the patient's poor quality of life with chemotherapy, although her symptoms could be also related to her metastatic cancer  -I discussed as the continuation of chemo benefit decreases and the side effects impacting her quality of life. She expressed her wishes to continue with treatments, but agree to have restaging scan in 2 weeks to evaluate her response to treatment  -I have low threshold to stop chemo if the risk outweigh the benefit. She agreed.  -I offered her palliative care service to help her at home. She will think about it.  -Labs reviewed and overall WNL except mild anemia. I will give her IVFs today to help with her fatigue. Proceed with treatment today, same dose -F/u on 1/13 with scan   2.Recurrent partial bowel obstruction secondary to sigmoid colon mass, status post stent placement on 08/11/17, 12/04/2017 and 06/25/2018 -Related to her tumor. -She was treated with multiple stent placements by Dr. MWatt Climes She is open to surgery if no other option -she is on laxities, we previously discussed low residue diet.  -No abdominal pain of BM issues post stent placement   3. Left renal mass  -Possible RCC or metastasis. -Last CT from 06/16/2018 showed stable mass.  4. Complex left ovarian cyst -06/16/18 CT showed stable bilateral adnexal masses, 4.3 x3.0 on left, 3.8 x2.6 on right -Will monitor  5. Atrial Fibrillation - Currently on baby aspirin -Stable.   6. HTN -f/u with PCP and continue recommended medications   7. Fatigue  -Secondary  to chemotherapy, which he has significantly impaired her activity level -Improved with reduced dose ironoteca. She is able to function well at home.  8. Insomnia  -since last cycle chemo, some symptoms of anxiety -I prescribed Xanax previously -She is sleeping better now. Stable.   9.Goal of care discussion  -The patient understands the goal of care is palliative. -she is full code for now  Plan  -Lab rviewed, adequate for treatment, will proceed irinotecan 90 mg/m, and vectibix  -CT CAP W Contrast before next visit -Flush, lab, treatment and f/u on 1/13 -I recommend palliative care home service, she will think about it    No problem-specific Assessment & Plan notes found for this encounter.   No orders of the defined types were placed in this encounter.  All questions were answered. The patient knows to call the clinic with any problems, questions or concerns. No barriers to learning was detected. I spent 20 minutes counseling the patient face to face. The total time spent in the appointment was 25 minutes and more than 50% was on counseling and review of test results     YTruitt Merle MD 08/10/2018   I, AJoslyn Devon am acting as scribe for YTruitt Merle MD.   I have reviewed the above documentation for accuracy and completeness, and I agree with the above.

## 2018-08-10 ENCOUNTER — Telehealth: Payer: Self-pay | Admitting: Hematology

## 2018-08-10 ENCOUNTER — Inpatient Hospital Stay: Payer: Medicare Other

## 2018-08-10 ENCOUNTER — Inpatient Hospital Stay (HOSPITAL_BASED_OUTPATIENT_CLINIC_OR_DEPARTMENT_OTHER): Payer: Medicare Other | Admitting: Hematology

## 2018-08-10 VITALS — BP 113/65 | HR 69 | Temp 98.7°F | Resp 16 | Wt 175.5 lb

## 2018-08-10 DIAGNOSIS — Z7982 Long term (current) use of aspirin: Secondary | ICD-10-CM

## 2018-08-10 DIAGNOSIS — C187 Malignant neoplasm of sigmoid colon: Secondary | ICD-10-CM | POA: Diagnosis not present

## 2018-08-10 DIAGNOSIS — N289 Disorder of kidney and ureter, unspecified: Secondary | ICD-10-CM | POA: Diagnosis not present

## 2018-08-10 DIAGNOSIS — I4819 Other persistent atrial fibrillation: Secondary | ICD-10-CM

## 2018-08-10 DIAGNOSIS — R530 Neoplastic (malignant) related fatigue: Secondary | ICD-10-CM | POA: Diagnosis not present

## 2018-08-10 DIAGNOSIS — Z7189 Other specified counseling: Secondary | ICD-10-CM | POA: Diagnosis not present

## 2018-08-10 DIAGNOSIS — I10 Essential (primary) hypertension: Secondary | ICD-10-CM | POA: Diagnosis not present

## 2018-08-10 DIAGNOSIS — C7800 Secondary malignant neoplasm of unspecified lung: Secondary | ICD-10-CM | POA: Diagnosis not present

## 2018-08-10 DIAGNOSIS — C7802 Secondary malignant neoplasm of left lung: Secondary | ICD-10-CM | POA: Diagnosis not present

## 2018-08-10 DIAGNOSIS — Z5112 Encounter for antineoplastic immunotherapy: Secondary | ICD-10-CM | POA: Diagnosis not present

## 2018-08-10 DIAGNOSIS — R21 Rash and other nonspecific skin eruption: Secondary | ICD-10-CM

## 2018-08-10 DIAGNOSIS — I4891 Unspecified atrial fibrillation: Secondary | ICD-10-CM | POA: Diagnosis not present

## 2018-08-10 DIAGNOSIS — C7801 Secondary malignant neoplasm of right lung: Secondary | ICD-10-CM | POA: Diagnosis not present

## 2018-08-10 DIAGNOSIS — Z5111 Encounter for antineoplastic chemotherapy: Secondary | ICD-10-CM | POA: Diagnosis not present

## 2018-08-10 DIAGNOSIS — C787 Secondary malignant neoplasm of liver and intrahepatic bile duct: Secondary | ICD-10-CM

## 2018-08-10 LAB — CBC WITH DIFFERENTIAL (CANCER CENTER ONLY)
Abs Immature Granulocytes: 0.04 10*3/uL (ref 0.00–0.07)
Basophils Absolute: 0 10*3/uL (ref 0.0–0.1)
Basophils Relative: 0 %
EOS PCT: 3 %
Eosinophils Absolute: 0.3 10*3/uL (ref 0.0–0.5)
HCT: 33.6 % — ABNORMAL LOW (ref 36.0–46.0)
HEMOGLOBIN: 10.6 g/dL — AB (ref 12.0–15.0)
Immature Granulocytes: 1 %
Lymphocytes Relative: 19 %
Lymphs Abs: 1.6 10*3/uL (ref 0.7–4.0)
MCH: 28 pg (ref 26.0–34.0)
MCHC: 31.5 g/dL (ref 30.0–36.0)
MCV: 88.9 fL (ref 80.0–100.0)
Monocytes Absolute: 1.1 10*3/uL — ABNORMAL HIGH (ref 0.1–1.0)
Monocytes Relative: 13 %
NRBC: 0 % (ref 0.0–0.2)
Neutro Abs: 5.5 10*3/uL (ref 1.7–7.7)
Neutrophils Relative %: 64 %
Platelet Count: 409 10*3/uL — ABNORMAL HIGH (ref 150–400)
RBC: 3.78 MIL/uL — ABNORMAL LOW (ref 3.87–5.11)
RDW: 15.1 % (ref 11.5–15.5)
WBC Count: 8.5 10*3/uL (ref 4.0–10.5)

## 2018-08-10 LAB — CMP (CANCER CENTER ONLY)
ALT: 14 U/L (ref 0–44)
AST: 13 U/L — ABNORMAL LOW (ref 15–41)
Albumin: 3.2 g/dL — ABNORMAL LOW (ref 3.5–5.0)
Alkaline Phosphatase: 77 U/L (ref 38–126)
Anion gap: 8 (ref 5–15)
BUN: 16 mg/dL (ref 8–23)
CO2: 25 mmol/L (ref 22–32)
Calcium: 9 mg/dL (ref 8.9–10.3)
Chloride: 105 mmol/L (ref 98–111)
Creatinine: 0.85 mg/dL (ref 0.44–1.00)
GFR, Est AFR Am: >60 mL/min (ref >60)
GFR, Estimated: >60 mL/min (ref >60)
Glucose, Bld: 100 mg/dL — ABNORMAL HIGH (ref 70–99)
POTASSIUM: 4.2 mmol/L (ref 3.5–5.1)
Sodium: 138 mmol/L (ref 135–145)
Total Bilirubin: 0.4 mg/dL (ref 0.3–1.2)
Total Protein: 6.9 g/dL (ref 6.5–8.1)

## 2018-08-10 LAB — MAGNESIUM: Magnesium: 1.7 mg/dL (ref 1.7–2.4)

## 2018-08-10 MED ORDER — SODIUM CHLORIDE 0.9 % IV SOLN
Freq: Once | INTRAVENOUS | Status: AC
  Administered 2018-08-10: 09:00:00 via INTRAVENOUS
  Filled 2018-08-10: qty 250

## 2018-08-10 MED ORDER — PALONOSETRON HCL INJECTION 0.25 MG/5ML
INTRAVENOUS | Status: AC
  Filled 2018-08-10: qty 5

## 2018-08-10 MED ORDER — PALONOSETRON HCL INJECTION 0.25 MG/5ML
0.2500 mg | Freq: Once | INTRAVENOUS | Status: AC
  Administered 2018-08-10: 0.25 mg via INTRAVENOUS

## 2018-08-10 MED ORDER — DEXAMETHASONE SODIUM PHOSPHATE 10 MG/ML IJ SOLN
INTRAMUSCULAR | Status: AC
  Filled 2018-08-10: qty 1

## 2018-08-10 MED ORDER — IRINOTECAN HCL CHEMO INJECTION 100 MG/5ML
90.0000 mg/m2 | Freq: Once | INTRAVENOUS | Status: AC
  Administered 2018-08-10: 180 mg via INTRAVENOUS
  Filled 2018-08-10: qty 9

## 2018-08-10 MED ORDER — DEXAMETHASONE SODIUM PHOSPHATE 10 MG/ML IJ SOLN
10.0000 mg | Freq: Once | INTRAMUSCULAR | Status: AC
  Administered 2018-08-10: 10 mg via INTRAVENOUS

## 2018-08-10 MED ORDER — ATROPINE SULFATE 1 MG/ML IJ SOLN
INTRAMUSCULAR | Status: AC
  Filled 2018-08-10: qty 1

## 2018-08-10 MED ORDER — ATROPINE SULFATE 1 MG/ML IJ SOLN
0.5000 mg | Freq: Once | INTRAMUSCULAR | Status: AC | PRN
  Administered 2018-08-10: 0.5 mg via INTRAVENOUS

## 2018-08-10 MED ORDER — SODIUM CHLORIDE 0.9 % IV SOLN
6.5000 mg/kg | Freq: Once | INTRAVENOUS | Status: AC
  Administered 2018-08-10: 500 mg via INTRAVENOUS
  Filled 2018-08-10: qty 20

## 2018-08-10 NOTE — Telephone Encounter (Signed)
Printed avs. (did not need a print out of the calendar)  Gave patient contrast for CT scan.

## 2018-08-10 NOTE — Patient Instructions (Signed)
[facility] Discharge Instructions for Patients Receiving Chemotherapy  Today you received the following chemotherapy agents:  Vectibix, Irinotecan  To help prevent nausea and vomiting after your treatment, we encourage you to take your nausea medication as prescribed.   If you develop nausea and vomiting that is not controlled by your nausea medication, call the clinic.   BELOW ARE SYMPTOMS THAT SHOULD BE REPORTED IMMEDIATELY:  *FEVER GREATER THAN 100.5 F  *CHILLS WITH OR WITHOUT FEVER  NAUSEA AND VOMITING THAT IS NOT CONTROLLED WITH YOUR NAUSEA MEDICATION  *UNUSUAL SHORTNESS OF BREATH  *UNUSUAL BRUISING OR BLEEDING  TENDERNESS IN MOUTH AND THROAT WITH OR WITHOUT PRESENCE OF ULCERS  *URINARY PROBLEMS  *BOWEL PROBLEMS  UNUSUAL RASH Items with * indicate a potential emergency and should be followed up as soon as possible.  Feel free to call the clinic should you have any questions or concerns. The clinic phone number is [phone].  Please show the [place] at check-in to the Emergency Department and triage nurse.

## 2018-08-10 NOTE — Progress Notes (Signed)
Per Malachy Mood Dr Ernestina Penna desk RN ok to start tx today as long as labs are good.

## 2018-08-11 ENCOUNTER — Encounter: Payer: Self-pay | Admitting: Hematology

## 2018-08-12 ENCOUNTER — Other Ambulatory Visit: Payer: Self-pay | Admitting: Hematology

## 2018-08-12 DIAGNOSIS — C187 Malignant neoplasm of sigmoid colon: Secondary | ICD-10-CM

## 2018-08-19 ENCOUNTER — Telehealth: Payer: Self-pay

## 2018-08-19 NOTE — Telephone Encounter (Signed)
Spoke with Mary [name] patient's daughter in law regarding the patient's desire to take a chemo break, per Dr. Burr Medico around a month is the amount of time recommended.  I told her I will cancel her appointments for 1/13 and 1/27 but keep the appointments on 2/10.  Also the patient desires to wait to have her CT scan after her break.  I will schedule this a few days prior to her next appointment on 2/10.  She verbalized an understanding and is in agreement with the plan.    Scheduling message was sent to reflect above.

## 2018-08-19 NOTE — Telephone Encounter (Signed)
Spoke with patient's daughter in law regarding CT scan now scheduled for 09/18/18 at 11:30 am, she verbalized an understanding.

## 2018-08-21 ENCOUNTER — Ambulatory Visit (HOSPITAL_COMMUNITY): Payer: Medicare Other

## 2018-08-24 ENCOUNTER — Other Ambulatory Visit: Payer: Medicare Other

## 2018-08-24 ENCOUNTER — Ambulatory Visit: Payer: Medicare Other | Admitting: Hematology

## 2018-08-24 ENCOUNTER — Ambulatory Visit: Payer: Medicare Other

## 2018-08-25 ENCOUNTER — Other Ambulatory Visit: Payer: Medicare Other

## 2018-08-25 ENCOUNTER — Ambulatory Visit: Payer: Medicare Other

## 2018-08-25 ENCOUNTER — Ambulatory Visit: Payer: Medicare Other | Admitting: Hematology

## 2018-08-25 DIAGNOSIS — D6481 Anemia due to antineoplastic chemotherapy: Secondary | ICD-10-CM | POA: Diagnosis not present

## 2018-08-25 DIAGNOSIS — I10 Essential (primary) hypertension: Secondary | ICD-10-CM | POA: Diagnosis not present

## 2018-08-25 DIAGNOSIS — E7800 Pure hypercholesterolemia, unspecified: Secondary | ICD-10-CM | POA: Diagnosis not present

## 2018-08-25 DIAGNOSIS — E559 Vitamin D deficiency, unspecified: Secondary | ICD-10-CM | POA: Diagnosis not present

## 2018-08-26 ENCOUNTER — Other Ambulatory Visit: Payer: Self-pay | Admitting: Cardiovascular Disease

## 2018-08-26 ENCOUNTER — Other Ambulatory Visit: Payer: Self-pay | Admitting: Hematology

## 2018-08-26 DIAGNOSIS — C186 Malignant neoplasm of descending colon: Secondary | ICD-10-CM

## 2018-09-07 ENCOUNTER — Other Ambulatory Visit: Payer: Medicare Other

## 2018-09-07 ENCOUNTER — Ambulatory Visit: Payer: Medicare Other

## 2018-09-07 ENCOUNTER — Ambulatory Visit: Payer: Medicare Other | Admitting: Hematology

## 2018-09-09 DIAGNOSIS — H401114 Primary open-angle glaucoma, right eye, indeterminate stage: Secondary | ICD-10-CM | POA: Diagnosis not present

## 2018-09-09 DIAGNOSIS — H0289 Other specified disorders of eyelid: Secondary | ICD-10-CM | POA: Diagnosis not present

## 2018-09-09 DIAGNOSIS — H401121 Primary open-angle glaucoma, left eye, mild stage: Secondary | ICD-10-CM | POA: Diagnosis not present

## 2018-09-18 ENCOUNTER — Ambulatory Visit (HOSPITAL_COMMUNITY)
Admission: RE | Admit: 2018-09-18 | Discharge: 2018-09-18 | Disposition: A | Discharge status: Home / Self Care | Payer: Medicare Other | Source: Ambulatory Visit | Attending: Hematology | Admitting: Hematology

## 2018-09-18 DIAGNOSIS — C189 Malignant neoplasm of colon, unspecified: Secondary | ICD-10-CM | POA: Diagnosis not present

## 2018-09-18 DIAGNOSIS — C187 Malignant neoplasm of sigmoid colon: Secondary | ICD-10-CM | POA: Diagnosis not present

## 2018-09-18 DIAGNOSIS — C787 Secondary malignant neoplasm of liver and intrahepatic bile duct: Secondary | ICD-10-CM | POA: Diagnosis not present

## 2018-09-18 MED ORDER — SODIUM CHLORIDE (PF) 0.9 % IJ SOLN
INTRAMUSCULAR | Status: AC
  Filled 2018-09-18: qty 50

## 2018-09-18 MED ORDER — IOHEXOL 300 MG/ML  SOLN
100.0000 mL | Freq: Once | INTRAMUSCULAR | Status: AC | PRN
  Administered 2018-09-18: 100 mL via INTRAVENOUS

## 2018-09-18 NOTE — Progress Notes (Signed)
Mary [name]   [phone] [phone]   Clinic Follow up Note   Patient Care Team: Moshe Cipro, MD as PCP - General (Internal Medicine) Alexis Frock, MD as Consulting Physician (Urology) Michael Boston, MD as Consulting Physician (General Surgery) Toma Deiters, MD as Referring Physician (Gastroenterology)  Date of Service:  09/21/2018  CHIEF COMPLAINT: F/u of colon cancer  SUMMARY OF ONCOLOGIC HISTORY: Oncology History   Cancer Staging Cancer of left colon [facility]) Staging form: Colon and Rectum, AJCC 8th Edition - Clinical stage from 09/04/2016: Stage IVB (cTX, cNX, pM1b) - Signed by Truitt Merle, MD on 11/12/2016       Cancer of sigmoid colon ([facility])   09/02/2016 Procedure    Colonoscopy Diverticulosis (scattered). Pt has about 10 cm structure starting at 20 cm with abnormal mucosa. Biopsies taken.     09/02/2016 Pathology Results    MODERATELY DIFFERENTIATED ADENOCARCINOMA OF COLON AT 20 CM    09/04/2016 Initial Diagnosis    Cancer of left colon ([place])    09/06/2016 Imaging    CT CAP w Contrast 1. No acute abdominopelvic process.  2. Heterogenous hypodense area involving the medial edge of the left kidney with multiple small soft tissue like stranding involving the left pararenal space. Findings are suspicious for malignancy of the left kidney with small pararenal metastases. MRI abdomen with contrast should be considered to further evaluate.  3. Vague hypodensity involving the lateral segment of the liver. This can also be reevaluated with MRI.  4. Partially visualized 5 mm posterior right lower lobe nodular density. CT chest without contrast follow up is recommended.  5. Complex multilobular left ovarian lesion may reflect a cluster of cysts.    10/10/2016 Procedure    Operative Report by Dr. Tresa Moore on 10/18/16 Procedure: 1) Cystoscopy, left retrograde pyelogram interpretation. 2) Left diagnostic uteroscopy. 3) Insertion of left uteral stent;  5 x 24 Polaris with tether. Findings: 1) Unremarkable bladder. 2) Unremarkable left retrograde pyelogram. 3) No evidence of intraluminal urothelial neoplasm whatsoever with insepction of the left kidney and ureter times several. 4) Successful placement of left ureteral stent, proximal in renal pelvis and distal in urinary bladder.    10/15/2016 Imaging    MRI abdomen pelvis 1. There are 2 enhancing hepatic lesions highly worrisome for metastatic disease, likely metastatic colon cancer. 2. The lesion of concern in the anterior suprahilar lip of the left kidney is also enhancing, worrisome for neoplasm. This has a somewhat atypical appearance for renal cell carcinoma and could reflect a metastasis as well. 3. New left-sided hydroureter with delayed contrast excretion consistent with distal ureteral obstruction. Specific etiology uncertain. 4. Complex cystic and solid left adnexal lesion could reflect cystic neoplasm, although does not appear aggressive or appear to reflect the cause of the distal left ureteral obstruction. 5. Sigmoid colon wall thickening could be related to reported newly diagnosed colon cancer.    11/06/2016 PET scan    PET 11/06/2016 IMPRESSION: 1. Hypermetabolic pulmonary and hepatic lesions, most consistent with metastatic colon cancer, with the hypermetabolic primary located in the sigmoid colon. 2. Left renal and left adnexal lesions, better seen and evaluated on 10/15/2016. 3. Persistent moderate left hydronephrosis. 4. Aortic atherosclerosis (ICD10-170.0).    11/11/2016 Pathology Results    Liver, needle/core biopsy, left lobe - METASTATIC ADENOCARCINOMA. The histologic features are consistent with a primary gastrointestinal adenocarcinoma.    05/19/2017 Imaging    CT Abdomen Pelvis, 05/19/2017 IMPRESSION: 1. Worsening pulmonary and hepatic metastatic disease. Primary sigmoid  lesion is stable to minimally enlarged. 2. Stable to slight enlargement of a  left perinephric mass, likely a metastasis. Renal cell carcinoma is not excluded. Associated pelvocaliectasis versus mild hydronephrosis. 3. Borderline left periaortic lymph nodes, slightly enlarged. 4. Cholelithiasis. 5.  Aortic atherosclerosis (ICD10-170.0). 6. Cystic and solid lesion in the left adnexa, as on prior exams, likely ovarian in origin.    06/06/2017 - 01/16/2018 Chemotherapy    IV antibody Vectibix every [redacted] weeks along with oral chemo Xeloda for 1 week on and 1 week off starting 06/06/17. Due to skin toxicities we reduced Xeloda to 2000 mg in the AM and 1547m in the PM 1 week on and 1 week off on 10/31/17. She had multiple treatment break due to hospitalization. Restarted treatment on 12/26/17. Chemo break 01/16/18-02/20/18    08/07/2017 - 08/16/2017 Hospital Admission    Admit date: 08/07/17 Admission diagnosis: Bowel Obstruction  Additional comments: She was admitted to the hospital on 08/07/17 after [facility] due to a bowel obstruction.  She had a sigmoid colon stent placement on 08/11/17. Bowel obstruction resolved.  CT scan in hospital showed great partial response to chemotherapy. She will continue chemotherapy given good overall tolerance.      08/07/2017 Imaging    CT AP W Contrast 08/07/17 IMPRESSION: Low colonic obstruction secondary to a rectosigmoid stricture at the site of the patient's prior colonic mass, which is no longer evident on CT. Mild upstream colonic wall thickening raises the possibility of superimposed colitis.  Improving hepatic metastases, as above. Left perirenal metastasis, decreased. Visualized pulmonary metastases at the lung bases are grossly unchanged.  3.5 cm mixed cystic/solid left ovarian mass is mildly decreased.     08/11/2017 Procedure    By Dr. MWatt Climes Flexible Sigmoidoscopy 08/11/17 IMPRESSION:   - Preparation of the colon was fair. - Likely malignant partially obstructing tumor in the recto-sigmoid  colon versus necrotic scarring. Injected with contrast proximal to obstruction to confirm proper position. Prosthesis placed. - Stool in the rectum. - No specimens collected.     10/22/2017 Imaging    CT CAP W Contrast 10/22/17 IMPRESSION: 1. Significant interval improvement and liver metastasis. 2. The left perinephric mass is slightly decreased in size in the interval. 3. No significant interval change in the appearance of multiple pulmonary metastasis. 4. Stable appearance of mediastinal adenopathy. 5. Gallstones 6.  Aortic Atherosclerosis (ICD10-I70.0). 7. Colonic stent remains in place without evidence for bowel obstruction.    12/01/2017 - 12/07/2017 Hospital Admission    Admit date: 12/01/17 Admission diagnosis: Bowel Obstruction and abdominal pain Additional comments: had flexible sigmoidoscopy by Dr. MWatt Climeson 12/04/17. Chemo was held during this time.     12/04/2017 Procedure    Flexible Sigmoidoscopy 12/04/17 IMPRESSION - Preparation of the colon was fair. - Stent in the colon. - Stricture in the distal sigmoid colon. Prosthesis placed. - No specimens collected.    02/18/2018 Imaging    CT CAP W Contrast 02/18/18 IMPRESSION: CT CHEST IMPRESSION 1. Bilateral pulmonary metastasis. Compared to the 12/01/2017 abdominal CT, mixed response to therapy. Compared to the 10/22/2017 chest CT, overall slight progression. 2. Similar thoracic adenopathy. 3.  Aortic Atherosclerosis (ICD10-I70.0). CT ABDOMEN AND PELVIS IMPRESSION 1. Since 12/01/2017, similar hepatic metastatic burden. Left renal/perirenal lesion is similar to minimally decreased in size. 2. Sigmoid colonic stent in place with resolution of colonic obstruction. 3. Similar mild soft tissue fullness within both ovaries.    02/20/2018 - 08/10/2018 Chemotherapy    She  started second lineIrinotecan and Vectibix every 2 weeks on 02/20/18.She did not tolerated very well with significant fatigue, and chemo dose was reduced  to 11m/m2. She is tolerating better now. Chemo has been held since 08/10/18.      05-Aug-202019 Imaging    IMPRESSION: -Stable distal colonic stent with eccentric soft tissue thickening on the right. No evidence of bowel obstruction. -Improving pulmonary and hepatic metastases, with index lesions as above. -Improving thoracic lymphadenopathy. -Stable fullness of the bilateral ovaries. -Stable enhancing lesion in the medial interpolar left kidney.     09/18/2018 Imaging    CT CAP W contrast 09/18/18 IMPRESSION: Stable rectosigmoid colonic stent with adjacent soft tissue. No evidence of true colonic obstruction.  Mildly progressive hepatic metastases, as above.  Progressive pulmonary metastases, as above.  Mildly progressive enhancing lesion in the medial left kidney.  Additional stable ancillary findings as above.       CURRENT THERAPY:  Supportive care    INTERVAL HISTORY:  Mary EDMONSTONis here for a follow up and treatment. She presents to the clinic today with her daughter-in-law. She notes she is doing mostly well. She is still fatigued, stable. She feels better since mucositis has resolved. She also notes intermittent abdominal pain.  She attributes the pain to gas as it is diffuse. She notes her Bowel movements are regular. She notes having back pain which comes and goes. She does not take anything for the pain, she allows for it to ease off. She notes she is still eating adequately to maintain her weight.  She continues to do everything for herself at home. She has been off treatment for 1.5 months. She does not feel any different than being on chemo.  She notes her PCP is concerned she is not on Blood thinner due to Afib. She was on blood thinner before but bleed and was stopped.     REVIEW OF SYSTEMS:   Constitutional: Denies fevers, chills or abnormal weight loss Eyes: Denies blurriness of vision Ears, nose, mouth, throat, and face: Denies mucositis or sore  throat Respiratory: Denies cough, dyspnea or wheezes Cardiovascular: Denies palpitation, chest discomfort or lower extremity swelling Gastrointestinal:  Denies nausea, heartburn or change in bowel habits (+) abdominal pain, mild and intermittent  Skin: Denies abnormal skin rashes MSK: (+) back pain, mild  Lymphatics: Denies new lymphadenopathy or easy bruising Neurological:Denies numbness, tingling or new weaknesses Behavioral/Psych: Mood is stable, no new changes  All other systems were reviewed with the patient and are negative.  MEDICAL HISTORY:  Past Medical History:  Diagnosis Date  . Aneurysm of ascending aorta ([facility]) 12/03/2016   3.7 cm by echo 10/2016  . Colon cancer ([facility]    dx via biospy from colonoscopy-- malignant ---  currently having work-up done w/ dr gross (general surgeon) and coordinate surgery w/ urologsit for renal tumor  . Essential hypertension 10/31/2016  . GERD (gastroesophageal reflux disease)   . Glaucoma   . Hiatal hernia   . HOH (hard of hearing)    bilateral even w/ hearing aids  . Hyperlipidemia   . Kidney tumor    left side  . Legally blind in right eye, as [place]   due to macular degeneration  . Macular degeneration   . Nausea    intermittant due to colon mass  . Ovarian cyst   . Persistent atrial fibrillation 10/31/2016   on ASA only  . PONV (postoperative nausea and vomiting)   . Wears glasses   .  Wears hearing aid    bilateral    SURGICAL HISTORY: Past Surgical History:  Procedure Laterality Date  . BREAST CYST EXCISION  1990   benign  . COLONIC STENT PLACEMENT N/A 12/04/2017   Procedure: COLONIC STENT PLACEMENT;  Surgeon: Clarene Essex, MD;  Location: [facility];  Service: Endoscopy;  Laterality: N/A;  . COLONIC STENT PLACEMENT N/A 06/25/2018   Procedure: COLONIC STENT PLACEMENT;  Surgeon: Clarene Essex, MD;  Location: [facility];  Service: Endoscopy;  Laterality: N/A;  . COLONOSCOPY  09/04/2016  . CYSTOSCOPY WITH RETROGRADE  PYELOGRAM, URETEROSCOPY AND STENT PLACEMENT Left 10/10/2016   Procedure: CYSTOSCOPY WITH RETROGRADE PYELOGRAM, DIAGNOSTIC URETEROSCOPY  AND STENT PLACEMENT;  Surgeon: Alexis Frock, MD;  Location[facility];  Service: Urology;  Laterality: Left;  . ESOPHAGOGASTRODUODENOSCOPY  11/07/2014  . FLEXIBLE SIGMOIDOSCOPY N/A 08/11/2017   Procedure: FLEXIBLE SIGMOIDOSCOPY;  Surgeon: Clarene Essex, MD;  Location: [facility];  Service: Endoscopy;  Laterality: N/A;  With Fluoroscopy for colonic stent placement for colonic stricture  . FLEXIBLE SIGMOIDOSCOPY N/A 12/04/2017   Procedure: FLEXIBLE SIGMOIDOSCOPY;  Surgeon: Clarene Essex, MD;  Location: [facility];  Service: Endoscopy;  Laterality: N/A;  . FLEXIBLE SIGMOIDOSCOPY N/A 06/25/2018   Procedure: FLEXIBLE SIGMOIDOSCOPY WITH STENT PLACEMENT;  Surgeon: Clarene Essex, MD;  Location: [facility];  Service: Endoscopy;  Laterality: N/A;  with stent placement  . NASAL SINUS SURGERY    . TONSILLECTOMY      I have reviewed the social history and family history with the patient and they are unchanged from previous note.  ALLERGIES:  is allergic to benazepril; tylenol [acetaminophen]; and bactrim [sulfamethoxazole-trimethoprim].  MEDICATIONS:  Current Outpatient Medications  Medication Sig Dispense Refill  . ALPRAZolam (XANAX) 0.25 MG tablet Take 1-2 tablets (0.25-0.5 mg total) by mouth at bedtime as needed for anxiety or sleep. 60 tablet 0  . amLODipine (NORVASC) 5 MG tablet Take 1 tablet (5 mg total) by mouth daily. 90 tablet 1  . bisacodyl (BISACODYL) 5 MG EC tablet Take 10 mg by mouth 2 (two) times daily.    . bisacodyl (DULCOLAX) 10 MG suppository Place 10 mg rectally daily as needed (severe constipation.).     Marland Kitchen diltiazem (CARDIZEM CD) 240 MG 24 hr capsule Take 1 capsule (240 mg total) by mouth daily. 90 capsule 1  . ergocalciferol (VITAMIN D2) 1.25 MG (50000 UT) capsule Take 50,000 Units by mouth once a week.    . escitalopram (LEXAPRO) 10  MG tablet Take 10 mg by mouth daily.    . fexofenadine (ALLEGRA) 180 MG tablet Take 180 mg by mouth daily as needed for allergies.     . furosemide (LASIX) 40 MG tablet Take 1 tablet (40 mg total) by mouth daily. Contact our office for an appointment 90 tablet 0  . latanoprost (XALATAN) 0.005 % ophthalmic solution Place 1 drop into both eyes at bedtime.    Marland Kitchen losartan (COZAAR) 100 MG tablet Take 1 tablet (100 mg total) by mouth daily. Contact our office for an appointment 90 tablet 0  . Multiple Vitamins-Minerals (VITEYES AREDS ADVANCED) CAPS Take 1 capsule by mouth daily.    Marland Kitchen omeprazole (PRILOSEC) 40 MG capsule Take 40 mg by mouth daily.    . ondansetron (ZOFRAN-ODT) 4 MG disintegrating tablet DISSOLVE 1 TABLET ON THE  TONGUE EVERY 8 HOURS AS  NEEDED FOR NAUSEA AND  VOMITING 180 tablet 1  . oxyCODONE (OXY IR/ROXICODONE) 5 MG immediate release tablet Take 1 tablet (5 mg total) by mouth every 6 (six)  hours as needed for moderate pain or severe pain. 90 tablet 0  . Polyethyl Glycol-Propyl Glycol (SYSTANE) 0.4-0.3 % SOLN Place 1 drop into both eyes 3 (three) times daily as needed (dry eyes).     . polyethylene glycol (MIRALAX / GLYCOLAX) packet Take 17 g by mouth daily as needed for moderate constipation. 30 each 0  . Potassium Chloride ER 20 MEQ TBCR Take 20 mEq by mouth daily. 30 tablet 0  . prochlorperazine (COMPAZINE) 10 MG tablet Take 1 tablet (10 mg total) by mouth every 6 (six) hours as needed for nausea or vomiting. 30 tablet 0  . sennosides-docusate sodium (SENOKOT-S) 8.6-50 MG tablet Take 1-2 tablets by mouth 3 (three) times daily.    . urea (CARMOL) 10 % cream Apply topically as needed. (Patient taking differently: Apply 1 application topically 3 (three) times daily as needed (for skin irritation). ) 71 g 0   Current Facility-Administered Medications  Medication Dose Route Frequency Provider Last Rate Last Dose  . sodium chloride flush (NS) 0.9 % injection 3 mL  3 mL Intravenous PRN  Skeet Latch, MD        PHYSICAL EXAMINATION: ECOG PERFORMANCE STATUS: 1 - Symptomatic but completely ambulatory  Vitals:   09/21/18 0950  BP: 107/61  Pulse: 69  Resp: 17  Temp: 97.7 F (36.5 C)  SpO2: 97%   Filed Weights   09/21/18 0950  Weight: 175 lb 6.4 oz (79.6 kg)    GENERAL:alert, no distress and comfortable SKIN: skin color, texture, turgor are normal, no rashes or significant lesions EYES: normal, Conjunctiva are pink and non-injected, sclera clear OROPHARYNX:no exudate, no erythema and lips, buccal mucosa, and tongue normal  NECK: supple, thyroid normal size, non-tender, without nodularity LYMPH:  no palpable lymphadenopathy in the cervical, axillary or inguinal LUNGS: clear to auscultation and percussion with normal breathing effort HEART: regular rate & rhythm and no murmurs and no lower extremity edema ABDOMEN:abdomen soft, non-tender and normal bowel sounds Musculoskeletal:no cyanosis of digits and no clubbing  NEURO: alert & oriented x 3 with fluent speech, no focal motor/sensory deficits  LABORATORY DATA:  I have reviewed the data as listed CBC Latest Ref Rng & Units 09/21/2018 08/10/2018 07/27/2018  WBC 4.0 - 10.5 K/uL 15.6(H) 8.5 8.5  Hemoglobin 12.0 - 15.0 g/dL 10.4(L) 10.6(L) 10.5(L)  Hematocrit 36.0 - 46.0 % 33.1(L) 33.6(L) 34.0(L)  Platelets 150 - 400 K/uL 453(H) 409(H) 395     CMP Latest Ref Rng & Units 09/21/2018 08/10/2018 07/27/2018  Glucose 70 - 99 mg/dL 91 100(H) 98  BUN 8 - 23 mg/dL 11 16 11   Creatinine 0.44 - 1.00 mg/dL 0.95 0.85 0.80  Sodium 135 - 145 mmol/L 138 138 140  Potassium 3.5 - 5.1 mmol/L 4.2 4.2 3.7  Chloride 98 - 111 mmol/L 104 105 106  CO2 22 - 32 mmol/L 24 25 24   Calcium 8.9 - 10.3 mg/dL 9.5 9.0 9.2  Total Protein 6.5 - 8.1 g/dL 7.9 6.9 7.4  Total Bilirubin 0.3 - 1.2 mg/dL 0.7 0.4 0.3  Alkaline Phos 38 - 126 U/L 84 77 99  AST 15 - 41 U/L 15 13(L) 16  ALT 0 - 44 U/L 12 14 25       RADIOGRAPHIC STUDIES: I have  personally reviewed the radiological images as listed and agreed with the findings in the report. No results found.   ASSESSMENT & PLAN:  NICOLA HEINEMANN is a 85 y.o. female with   1. Adenocarcinoma of sigmoid colon, TxNxM1 with  liver and lung mets, stage IV, KRAS/NRS wild type, MSI-stable -Diagnosed in 08/2016. She had disease progression on first line Xeloda and Vectibix.  -She started second lineIrinotecan and Vectibix every 2 weeks on 02/20/18.She did not tolerated very well with significant fatigue, and chemo dose was reduced to 57m/m2. Chemo has been held since 08/10/18 due to poor tolerance.  -We discussed her CT CAP from 09/18/18 which showed stable rectosigmoid stent and no evidence of SBO. There is unfortunately mild progression of liver mets, lung mets and left kidney lesion. I reviewed her images personally with patient in great detail with patient and family member.  -Given her poor tolerance and disease progression, I do not recommend she continue with IV irinotecan and vectibix. She is not interested in proceeding with more treatment which is understanding.   -I discussed without treatment her cancer will continue to progress and symptoms will worsen. I discussed her main symptom will recurrent bowel obstruction.  -We discussed palliative care and hospice.  Is open to meet the team, she is more interested in palliative care and may not be ready for hospice. I will refer her  -F/u in 4 weeks, OK to cancel is she enrolls to hospice program    2.Recurrent partial bowel obstruction secondary to sigmoid colon mass, status post stent placement on 08/11/17, 12/04/2017 and 06/25/2018 -Related to her tumor. -She was treated with multiple stent placements by Dr. MWatt Climes She is open to surgery ifno other option -she is on laxities,we previously discussed low residue diet.  -She has developed mild intermittent abdominal pain. Not on pain medication. Will monitor. I refilled her oxycodone  today, in case pain worsens.  -I encouraged her to continue laxative to avoid constipation or slow bowels as this can contribute to SBO.   3. Left renal mass -Possible RCC or metastasis. -CT from 09/18/18 shows mildly progressive enhancing lesion in the medial left kidney.  4. Complex left ovarian cyst -09/18/18 CT scan shows right ovarian cyst, mildly progressive at 3.6cm   5. Atrial Fibrillation -Stable.   Xarelto was stopped due to GI bleeding from her tumor. -She is fine to restart Baby Aspirin. I discussed her risk of bleeding, will monitor.   6. HTN -f/u with PCP and continue recommended medications   7. Fatigue  -Secondary to chemotherapy, which he has significantly impaired her activity level -Improved with reduced dose irinotecan. She is able to function well at home. -Her fatigue is overall stable.   8. Insomnia  -She has had some symptoms of anxiety with prior chemo -On Xanax, I refilled today  -She is sleeping better now. Stable.   9.Goal of care discussion, DRN/DNI  -We again discussed the incurable nature of her metastatic cancer, and the overall poor prognosis, especially if she does not have good response to chemotherapy or progress on chemo -The patient understands the goal of care is palliative. -I recommend DNR/DNI, she agreed and signed for a DNR/DNI.    Plan -Lab, flush, f/u in 4 weeks  -I refilled Xanax and Oxycodone today  -Refer to palliative home care service -she is DNR/DNI    No problem-specific Assessment & Plan notes found for this encounter.   No orders of the defined types were placed in this encounter.  All questions were answered. The patient knows to call the clinic with any problems, questions or concerns. No barriers to learning was detected. I spent 25 minutes counseling the patient face to face. The total time spent in the appointment was  25 minutes and more than 50% was on counseling and review of test results     Truitt Merle, MD 09/21/2018   I, Joslyn Devon, am acting as scribe for Truitt Merle, MD.   I have reviewed the above documentation for accuracy and completeness, and I agree with the above.

## 2018-09-21 ENCOUNTER — Inpatient Hospital Stay: Payer: Medicare Other

## 2018-09-21 ENCOUNTER — Encounter: Payer: Self-pay | Admitting: Hematology

## 2018-09-21 ENCOUNTER — Telehealth: Payer: Self-pay | Admitting: Hematology

## 2018-09-21 ENCOUNTER — Telehealth: Payer: Self-pay

## 2018-09-21 ENCOUNTER — Inpatient Hospital Stay (HOSPITAL_BASED_OUTPATIENT_CLINIC_OR_DEPARTMENT_OTHER): Payer: Medicare Other | Admitting: Hematology

## 2018-09-21 ENCOUNTER — Inpatient Hospital Stay: Payer: Medicare Other | Attending: Hematology

## 2018-09-21 VITALS — BP 107/61 | HR 69 | Temp 97.7°F | Resp 17 | Ht 66.0 in | Wt 175.4 lb

## 2018-09-21 DIAGNOSIS — N289 Disorder of kidney and ureter, unspecified: Secondary | ICD-10-CM | POA: Diagnosis not present

## 2018-09-21 DIAGNOSIS — C7800 Secondary malignant neoplasm of unspecified lung: Secondary | ICD-10-CM | POA: Diagnosis not present

## 2018-09-21 DIAGNOSIS — Z7189 Other specified counseling: Secondary | ICD-10-CM | POA: Diagnosis not present

## 2018-09-21 DIAGNOSIS — M545 Low back pain: Secondary | ICD-10-CM | POA: Diagnosis not present

## 2018-09-21 DIAGNOSIS — I10 Essential (primary) hypertension: Secondary | ICD-10-CM | POA: Insufficient documentation

## 2018-09-21 DIAGNOSIS — I4891 Unspecified atrial fibrillation: Secondary | ICD-10-CM

## 2018-09-21 DIAGNOSIS — C187 Malignant neoplasm of sigmoid colon: Secondary | ICD-10-CM | POA: Diagnosis not present

## 2018-09-21 DIAGNOSIS — R530 Neoplastic (malignant) related fatigue: Secondary | ICD-10-CM

## 2018-09-21 DIAGNOSIS — Z66 Do not resuscitate: Secondary | ICD-10-CM | POA: Insufficient documentation

## 2018-09-21 DIAGNOSIS — C787 Secondary malignant neoplasm of liver and intrahepatic bile duct: Secondary | ICD-10-CM

## 2018-09-21 DIAGNOSIS — G4700 Insomnia, unspecified: Secondary | ICD-10-CM | POA: Insufficient documentation

## 2018-09-21 DIAGNOSIS — R109 Unspecified abdominal pain: Secondary | ICD-10-CM | POA: Diagnosis not present

## 2018-09-21 DIAGNOSIS — Z95828 Presence of other vascular implants and grafts: Secondary | ICD-10-CM | POA: Insufficient documentation

## 2018-09-21 LAB — CBC WITH DIFFERENTIAL (CANCER CENTER ONLY)
ABS IMMATURE GRANULOCYTES: 0.06 10*3/uL (ref 0.00–0.07)
BASOS PCT: 0 %
Basophils Absolute: 0.1 10*3/uL (ref 0.0–0.1)
Eosinophils Absolute: 0.3 10*3/uL (ref 0.0–0.5)
Eosinophils Relative: 2 %
HCT: 33.1 % — ABNORMAL LOW (ref 36.0–46.0)
Hemoglobin: 10.4 g/dL — ABNORMAL LOW (ref 12.0–15.0)
Immature Granulocytes: 0 %
Lymphocytes Relative: 17 %
Lymphs Abs: 2.6 10*3/uL (ref 0.7–4.0)
MCH: 28.1 pg (ref 26.0–34.0)
MCHC: 31.4 g/dL (ref 30.0–36.0)
MCV: 89.5 fL (ref 80.0–100.0)
Monocytes Absolute: 1.7 10*3/uL — ABNORMAL HIGH (ref 0.1–1.0)
Monocytes Relative: 11 %
Neutro Abs: 10.9 10*3/uL — ABNORMAL HIGH (ref 1.7–7.7)
Neutrophils Relative %: 70 %
Platelet Count: 453 10*3/uL — ABNORMAL HIGH (ref 150–400)
RBC: 3.7 MIL/uL — ABNORMAL LOW (ref 3.87–5.11)
RDW: 16.1 % — ABNORMAL HIGH (ref 11.5–15.5)
WBC: 15.6 10*3/uL — AB (ref 4.0–10.5)
nRBC: 0 % (ref 0.0–0.2)

## 2018-09-21 LAB — CMP (CANCER CENTER ONLY)
ALT: 12 U/L (ref 0–44)
AST: 15 U/L (ref 15–41)
Albumin: 3.3 g/dL — ABNORMAL LOW (ref 3.5–5.0)
Alkaline Phosphatase: 84 U/L (ref 38–126)
Anion gap: 10 (ref 5–15)
BUN: 11 mg/dL (ref 8–23)
CO2: 24 mmol/L (ref 22–32)
Calcium: 9.5 mg/dL (ref 8.9–10.3)
Chloride: 104 mmol/L (ref 98–111)
Creatinine: 0.95 mg/dL (ref 0.44–1.00)
GFR, EST NON AFRICAN AMERICAN: 56 mL/min — AB (ref >60)
GFR, Est AFR Am: >60 mL/min (ref >60)
Glucose, Bld: 91 mg/dL (ref 70–99)
Potassium: 4.2 mmol/L (ref 3.5–5.1)
Sodium: 138 mmol/L (ref 135–145)
Total Bilirubin: 0.7 mg/dL (ref 0.3–1.2)
Total Protein: 7.9 g/dL (ref 6.5–8.1)

## 2018-09-21 LAB — CEA (IN HOUSE-CHCC): CEA (CHCC-In House): 3.02 ng/mL (ref 0.00–5.00)

## 2018-09-21 LAB — MAGNESIUM: Magnesium: 2.1 mg/dL (ref 1.7–2.4)

## 2018-09-21 MED ORDER — OXYCODONE HCL 5 MG PO TABS: 5.0000 mg | ORAL_TABLET | Freq: Four times a day (QID) | ORAL | 0 refills | Status: DC | PRN

## 2018-09-21 MED ORDER — ALPRAZOLAM 0.25 MG PO TABS: 0.2500 mg | ORAL_TABLET | Freq: Every evening | ORAL | 0 refills | Status: DC | PRN

## 2018-09-21 NOTE — Telephone Encounter (Signed)
Faxed referral for Palliative Care Services to [facility] with demographics and office note.

## 2018-09-21 NOTE — Telephone Encounter (Signed)
Scheduled appt per 2/10 los.  Patient stated she does not have a port.  Printed calendar and avs.

## 2018-09-28 ENCOUNTER — Ambulatory Visit (HOSPITAL_COMMUNITY): Payer: Medicare Other

## 2018-10-14 ENCOUNTER — Other Ambulatory Visit: Payer: Self-pay | Admitting: Gastroenterology

## 2018-10-18 NOTE — Anesthesia Preprocedure Evaluation (Addendum)
Anesthesia Evaluation  Patient identified by MRN, date of birth, ID band Patient awake    Reviewed: Allergy & Precautions, NPO status , Patient's Chart, lab work & pertinent test results  History of Anesthesia Complications (+) PONV  Airway Mallampati: II  TM Distance: >3 FB Neck ROM: Full    Dental no notable dental hx. (+) Teeth Intact, Dental Advisory Given   Pulmonary neg pulmonary ROS,    Pulmonary exam normal breath sounds clear to auscultation       Cardiovascular hypertension, Pt. on medications Normal cardiovascular exam+ dysrhythmias Atrial Fibrillation  Rhythm:Regular Rate:Normal  11/04/2016 TTE Left ventricle: The cavity size was normal. Wall thickness was   increased in a pattern of mild LVH. Indeterminant diastolic   function (atrial fibrillation). Systolic function was normal. The   estimated ejection fraction was in the range of 60% to 65%. Wall   motion was normal; there were no regional wall motion   abnormalities.   Neuro/Psych negative neurological ROS     GI/Hepatic GERD  ,  Endo/Other    Renal/GU Renal disease     Musculoskeletal   Abdominal   Peds  Hematology   Anesthesia Other Findings Hx of colon CA  Reproductive/Obstetrics                            Anesthesia Physical Anesthesia Plan  ASA: III  Anesthesia Plan: MAC   Post-op Pain Management:    Induction:   PONV Risk Score and Plan: Treatment may vary due to age or medical condition  Airway Management Planned: Natural Airway and Nasal Cannula  Additional Equipment:   Intra-op Plan:   Post-operative Plan:   Informed Consent: I have reviewed the patients History and Physical, chart, labs and discussed the procedure including the risks, benefits and alternatives for the proposed anesthesia with the patient or authorized representative who has indicated his/her understanding and acceptance.    Patient has DNR.  Discussed DNR with patient and Continue DNR.   Dental advisory given  Plan Discussed with: CRNA  Anesthesia Plan Comments: (Continuing DNR: discussed the possibility of brief circulatory and respiratory support to reverse effects of induction drugs patient in agreement.)      Anesthesia Quick Evaluation

## 2018-10-19 ENCOUNTER — Ambulatory Visit (HOSPITAL_COMMUNITY): Payer: Medicare Other

## 2018-10-19 ENCOUNTER — Encounter (HOSPITAL_COMMUNITY): Payer: Self-pay | Admitting: Gastroenterology

## 2018-10-19 ENCOUNTER — Ambulatory Visit (HOSPITAL_COMMUNITY): Payer: Medicare Other | Admitting: Certified Registered Nurse Anesthetist

## 2018-10-19 ENCOUNTER — Inpatient Hospital Stay: Payer: Medicare Other | Admitting: Hematology

## 2018-10-19 ENCOUNTER — Ambulatory Visit (HOSPITAL_COMMUNITY)
Admission: RE | Admit: 2018-10-19 | Discharge: 2018-10-19 | Disposition: A | Discharge status: Home / Self Care | Payer: Medicare Other | Attending: Gastroenterology | Admitting: Gastroenterology

## 2018-10-19 ENCOUNTER — Encounter (HOSPITAL_COMMUNITY)
Admission: RE | Disposition: A | Discharge status: Home / Self Care | Payer: Self-pay | Source: Home / Self Care | Attending: Gastroenterology

## 2018-10-19 ENCOUNTER — Other Ambulatory Visit: Payer: Self-pay

## 2018-10-19 ENCOUNTER — Inpatient Hospital Stay: Payer: Medicare Other

## 2018-10-19 DIAGNOSIS — K219 Gastro-esophageal reflux disease without esophagitis: Secondary | ICD-10-CM | POA: Insufficient documentation

## 2018-10-19 DIAGNOSIS — Z79891 Long term (current) use of opiate analgesic: Secondary | ICD-10-CM | POA: Insufficient documentation

## 2018-10-19 DIAGNOSIS — K648 Other hemorrhoids: Secondary | ICD-10-CM | POA: Diagnosis not present

## 2018-10-19 DIAGNOSIS — C187 Malignant neoplasm of sigmoid colon: Secondary | ICD-10-CM | POA: Insufficient documentation

## 2018-10-19 DIAGNOSIS — Z888 Allergy status to other drugs, medicaments and biological substances status: Secondary | ICD-10-CM | POA: Insufficient documentation

## 2018-10-19 DIAGNOSIS — K56699 Other intestinal obstruction unspecified as to partial versus complete obstruction: Secondary | ICD-10-CM | POA: Diagnosis not present

## 2018-10-19 DIAGNOSIS — Z433 Encounter for attention to colostomy: Secondary | ICD-10-CM | POA: Diagnosis not present

## 2018-10-19 DIAGNOSIS — I10 Essential (primary) hypertension: Secondary | ICD-10-CM | POA: Insufficient documentation

## 2018-10-19 DIAGNOSIS — I4819 Other persistent atrial fibrillation: Secondary | ICD-10-CM | POA: Insufficient documentation

## 2018-10-19 DIAGNOSIS — K644 Residual hemorrhoidal skin tags: Secondary | ICD-10-CM | POA: Insufficient documentation

## 2018-10-19 DIAGNOSIS — Z882 Allergy status to sulfonamides status: Secondary | ICD-10-CM | POA: Insufficient documentation

## 2018-10-19 DIAGNOSIS — K56691 Other complete intestinal obstruction: Secondary | ICD-10-CM | POA: Diagnosis not present

## 2018-10-19 DIAGNOSIS — C19 Malignant neoplasm of rectosigmoid junction: Secondary | ICD-10-CM | POA: Diagnosis not present

## 2018-10-19 DIAGNOSIS — Z66 Do not resuscitate: Secondary | ICD-10-CM | POA: Diagnosis not present

## 2018-10-19 DIAGNOSIS — Z79899 Other long term (current) drug therapy: Secondary | ICD-10-CM | POA: Insufficient documentation

## 2018-10-19 DIAGNOSIS — C189 Malignant neoplasm of colon, unspecified: Secondary | ICD-10-CM

## 2018-10-19 HISTORY — PX: FLEXIBLE SIGMOIDOSCOPY: SHX5431

## 2018-10-19 HISTORY — PX: COLONIC STENT PLACEMENT: SHX5542

## 2018-10-19 SURGERY — SIGMOIDOSCOPY, FLEXIBLE
Anesthesia: Monitor Anesthesia Care

## 2018-10-19 MED ORDER — SODIUM CHLORIDE 0.9 % IV SOLN: INTRAVENOUS | Status: DC

## 2018-10-19 MED ORDER — PHENYLEPHRINE 40 MCG/ML (10ML) SYRINGE FOR IV PUSH (FOR BLOOD PRESSURE SUPPORT)
PREFILLED_SYRINGE | INTRAVENOUS | Status: DC | PRN
  Administered 2018-10-19 (×3): 80 ug via INTRAVENOUS

## 2018-10-19 MED ORDER — PROPOFOL 10 MG/ML IV BOLUS
INTRAVENOUS | Status: AC
  Filled 2018-10-19: qty 60

## 2018-10-19 MED ORDER — LACTATED RINGERS IV SOLN
INTRAVENOUS | Status: DC
  Administered 2018-10-19: 09:00:00 via INTRAVENOUS

## 2018-10-19 MED ORDER — ONDANSETRON HCL 4 MG/2ML IJ SOLN
INTRAMUSCULAR | Status: DC | PRN
  Administered 2018-10-19: 4 mg via INTRAVENOUS

## 2018-10-19 MED ORDER — PROPOFOL 10 MG/ML IV BOLUS
INTRAVENOUS | Status: DC | PRN
  Administered 2018-10-19: 25 mg via INTRAVENOUS

## 2018-10-19 MED ORDER — PROPOFOL 500 MG/50ML IV EMUL
INTRAVENOUS | Status: DC | PRN
  Administered 2018-10-19: 100 ug/kg/min via INTRAVENOUS

## 2018-10-19 NOTE — Anesthesia Procedure Notes (Signed)
Procedure Name: MAC Date/Time: 10/19/2018 9:33 AM Performed by: Claudia Desanctis, CRNA Pre-anesthesia Checklist: Patient identified, Emergency Drugs available, Suction available and Patient being monitored Patient Re-evaluated:Patient Re-evaluated prior to induction Oxygen Delivery Method: Simple face mask

## 2018-10-19 NOTE — Transfer of Care (Signed)
Immediate Anesthesia Transfer of Care Note  Patient: Mary [name]  Procedure(s) Performed: FLEXIBLE SIGMOIDOSCOPY (N/A ) COLONIC STENT PLACEMENT HAVE 9 AND 12 STENT AVAILABLE (N/A )  Patient Location: Endoscopy Unit  Anesthesia Type:MAC  Level of Consciousness: awake, alert , oriented and patient cooperative  Airway & Oxygen Therapy: Patient Spontanous Breathing  Post-op Assessment: Report given to RN and Post -op Vital signs reviewed and stable  Post vital signs: Reviewed and stable  Last Vitals:  Vitals Value Taken Time  BP    Temp    Pulse 97 10/19/2018 10:46 AM  Resp 13 10/19/2018 10:46 AM  SpO2 96 % 10/19/2018 10:46 AM  Vitals shown include unvalidated device data.  Last Pain:  Vitals:   10/19/18 0902  TempSrc: Oral  PainSc: 0-No pain         Complications: No apparent anesthesia complications

## 2018-10-19 NOTE — Progress Notes (Signed)
Mary [name] 9:18 AM  Subjective: Patient has had a difficult week we talked to her a few times on the phone last week and she has not had a bowel movement despite days of clear liquids and multiple doses of MiraLAX we again discussed her case with her daughter and she is no longer getting any therapy and has no new complaints and specifically no nausea or vomiting  Objective: Vital signs stable afebrile no acute distress lungs are clear regular rate and rhythm abdomen is distended little firm no bowel sounds nontender however CT reviewed labs reviewed  Assessment: Probable colonic obstruction patient with metastatic colon cancer  Plan: Okay to proceed with flex sig and possibly restenting with anesthesia assistance  [facility]  Pager [phone] After 5PM or if no answer call [phone]

## 2018-10-19 NOTE — Discharge Instructions (Signed)
Continue clear liquids today and if okay slowly advance tomorrow and continue to use MiraLAX every day increasing or decreasing dose as needed and try to minimize pain medicine and call me if any question or problem otherwise follow-up in 1 month   Colonoscopy, Adult, Care After This sheet gives you information about how to care for yourself after your procedure. Your doctor may also give you more specific instructions. If you have problems or questions, call your doctor. What can I expect after the procedure? After the procedure, it is common to have:  A small amount of blood in your poop for 24 hours.  Some gas.  Mild cramping or bloating in your belly. Follow these instructions at home: General instructions  For the first 24 hours after the procedure: ? Do not drive or use machinery. ? Do not sign important documents. ? Do not drink alcohol. ? Do your daily activities more slowly than normal. ? Eat foods that are soft and easy to digest.  Take over-the-counter or prescription medicines only as told by your doctor. To help cramping and bloating:   Try walking around.  Put heat on your belly (abdomen) as told by your doctor. Use a heat source that your doctor recommends, such as a moist heat pack or a heating pad. ? Put a towel between your skin and the heat source. ? Leave the heat on for 20-30 minutes. ? Remove the heat if your skin turns bright red. This is especially important if you cannot feel pain, heat, or cold. You can get burned. Eating and drinking   Drink enough fluid to keep your pee (urine) clear or pale yellow.  Return to your normal diet as told by your doctor. Avoid heavy or fried foods that are hard to digest.  Avoid drinking alcohol for as long as told by your doctor. Contact a doctor if:  You have blood in your poop (stool) 2-3 days after the procedure. Get help right away if:  You have more than a small amount of blood in your poop.  You see large  clumps of tissue (blood clots) in your poop.  Your belly is swollen.  You feel sick to your stomach (nauseous).  You throw up (vomit).  You have a fever.  You have belly pain that gets worse, and medicine does not help your pain. Summary  After the procedure, it is common to have a small amount of blood in your poop. You may also have mild cramping and bloating in your belly.  For the first 24 hours after the procedure, do not drive or use machinery, do not sign important documents, and do not drink alcohol.  Get help right away if you have a lot of blood in your poop, feel sick to your stomach, have a fever, or have more belly pain. This information is not intended to replace advice given to you by your health care provider. Make sure you discuss any questions you have with your health care provider. Document Released: 08/31/2010 Document Revised: 05/29/2017 Document Reviewed: 04/22/2016 Elsevier Interactive Patient Education  2019 [employer].

## 2018-10-19 NOTE — Anesthesia Postprocedure Evaluation (Signed)
Anesthesia Post Note  Patient: Mary [name]  Procedure(s) Performed: FLEXIBLE SIGMOIDOSCOPY (N/A ) COLONIC STENT PLACEMENT HAVE 9 AND 12 STENT AVAILABLE (N/A )     Patient location during evaluation: Endoscopy Anesthesia Type: MAC Level of consciousness: awake and alert Pain management: pain level controlled Vital Signs Assessment: post-procedure vital signs reviewed and stable Respiratory status: spontaneous breathing, nonlabored ventilation, respiratory function stable and patient connected to nasal cannula oxygen Cardiovascular status: blood pressure returned to baseline and stable Postop Assessment: no apparent nausea or vomiting Anesthetic complications: no    Last Vitals:  Vitals:   10/19/18 1100 10/19/18 1110  BP: 136/73 (!) 143/84  Pulse: 92 80  Resp: 13 14  Temp:    SpO2: 97% 98%    Last Pain:  Vitals:   10/19/18 1110  TempSrc:   PainSc: 0-No pain                 Barnet Glasgow

## 2018-10-19 NOTE — Op Note (Signed)
[facility] Patient Name: Mary [name] Procedure Date: 10/19/2018 MRN: [id number] Attending MD: Clarene Essex , MD Date of Birth: 05-Dec-1933 CSN: [id number] Age: 85 Admit Type: Outpatient Procedure:                Flexible Sigmoidoscopy Indications:              Cancer in the sigmoid colon, For therapy of cancer                            in the sigmoid colon, For therapy of colonic                            obstruction Providers:                Clarene Essex, MD, Cleda Daub, RN, Tinnie Gens,                            Technician, Dellie Catholic Referring MD:              Medicines:                Propofol total dose 400 mg IV, Ondansetron 4 mg IV Complications:            No immediate complications. Estimated Blood Loss:     Estimated blood loss: none. Procedure:                Pre-Anesthesia Assessment:                           - Prior to the procedure, a History and Physical                            was performed, and patient medications and                            allergies were reviewed. The patient's tolerance of                            previous anesthesia was also reviewed. The risks                            and benefits of the procedure and the sedation                            options and risks were discussed with the patient.                            All questions were answered, and informed consent                            was obtained. Prior Anticoagulants: The patient has                            taken no previous anticoagulant or antiplatelet  agents. ASA Grade Assessment: III - A patient with                            severe systemic disease. After reviewing the risks                            and benefits, the patient was deemed in                            satisfactory condition to undergo the procedure.                           After obtaining informed consent, the scope was                            passed  under direct vision. The GIF-XP190N                            ([id number]) Olympus ultra slim endoscope was                            introduced through the anus and advanced to the the                            sigmoid colon. The flexible sigmoidoscopy was                            somewhat difficult due to abnormal anatomy. We                            passed the scope with fluoroscopy guidance but                            could not advance far up the sigmoid due to                            inadequate prep and under fluoroscopy guidance we                            advanced the long Jagwire through the scope and                            remove the scope keeping the wire in the proper                            position under fluoroscopy and successful                            completion of the procedure was aided by performing                            the maneuvers documented (below) in this report.  The quality of the bowel preparation was fair. The                            patient tolerated the procedure well. Scope In: Scope Out: Findings:      External and internal hemorrhoids were found during perianal exam and       during digital exam. The hemorrhoids were small.      A Wallstent was found in the recto-sigmoid colon. Compressed distally by       the tumor      An infiltrative completely obstructing mass was found in the       recto-sigmoid colon. The mass was circumferential. This was stented with       a 22 mm x 9 cm WallFlex stent.      The exam was otherwise without abnormality. Under fluoroscopy guidance       we initially tried to place a 12 cm covered stent but could not get the       stent to make a sigmoid turn spite gentle pressure and so that stent was       removed and we placed the wall flex stent as above and advanced the       scope next to the stent to watch it deploy properly which was done in       the customary fashion  with excellent results and a nice waist at the       middle of the stent Impression:               - Preparation of the colon was fair.                           - External and internal hemorrhoids.                           - Stent in the colon.                           - Malignant completely obstructing tumor in the                            recto-sigmoid colon. Prosthesis placed.                           - The examination was otherwise normal.                           - No specimens collected. Moderate Sedation:      Not Applicable - Patient had care per Anesthesia. Recommendation:           - Continue present medications.                           - Clear liquid diet today. Continue MiraLAX every                            day                           - Return to GI clinic in 4  weeks. Consider trying                            to add a covered stent prior to obstruction which                            might slow down tumor ingrowth                           - Telephone GI clinic if symptomatic PRN. Procedure Code(s):        --- Professional ---                           [phone], Sigmoidoscopy, flexible; with placement of                            endoscopic stent (includes pre- and post-dilation                            and guide wire passage, when performed) Diagnosis Code(s):        --- Professional ---                           K64.8, Other hemorrhoids                           C19, Malignant neoplasm of rectosigmoid junction                           K56.691, Other complete intestinal obstruction                           C18.7, Malignant neoplasm of sigmoid colon                           K56.609, Unspecified intestinal obstruction,                            unspecified as to partial versus complete                            obstruction CPT copyright 2018 American Medical Association. All rights reserved. The codes documented in this report are preliminary and upon  coder review may  be revised to meet current compliance requirements. Clarene Essex, MD 10/19/2018 10:45:46 AM This report has been signed electronically. Number of Addenda: 0

## 2018-10-20 ENCOUNTER — Encounter (HOSPITAL_COMMUNITY): Payer: Self-pay | Admitting: Gastroenterology

## 2018-11-18 ENCOUNTER — Telehealth: Payer: Self-pay

## 2018-11-18 ENCOUNTER — Telehealth: Payer: Self-pay | Admitting: Hematology

## 2018-11-18 ENCOUNTER — Encounter: Payer: Self-pay | Admitting: General Practice

## 2018-11-18 NOTE — Telephone Encounter (Signed)
I called pt's daughter-in -law stephanie. She states pt is overall stable, does have abdominal pain, which has not improved much since her last colonic stent placement on 10/19/2018, she is still able to liver independently, and would like to enjoy the time she has left. They have met hospice before, but did not enroll. [name] think she is more open to hospice now, and will talk to her, she would like us to send referral now. I will refer her to her local [facility] hospice program. I encouraged her to call me if anything we can help. She appreciated the call.   Yan Feng  11/18/2018  

## 2018-11-18 NOTE — Progress Notes (Signed)
[facility] [place] Team contacted patient to assess for food insecurity and other psychosocial needs during current COVID19 pandemic.  Spoke w daughter, pt missed last appt at [facility] due to stent need.  Daughter states she will need another order for hospice services at home due to increase in patient pain/symptoms at home.  Will call when want to start service, prefers [facility].    Patient/family expressed no needs at this time.  Support Team member encouraged patient to call if changes occur or they have any other questions/concerns.   Beverely Pace, Leigh

## 2018-11-18 NOTE — Telephone Encounter (Signed)
Faxed Hospice referral to [place] at [phone] included demographics and last OV note, with [name] (daughter in laws) contact information.

## 2018-11-23 ENCOUNTER — Telehealth: Payer: Self-pay

## 2018-11-23 NOTE — Telephone Encounter (Signed)
Faxed signed order, demographic sheet and symptom management form back to [facility] to [place].

## 2018-11-25 ENCOUNTER — Telehealth: Payer: Self-pay

## 2018-11-25 NOTE — Telephone Encounter (Signed)
Faxed order to [facility]osp[facility] for Mary [name].  According to Wells Guiles with [facility] the family really desires palliative care versus Hospice.  Sent to HIM for scanning to chart.

## 2018-12-09 ENCOUNTER — Other Ambulatory Visit: Payer: Self-pay | Admitting: Hematology

## 2018-12-09 ENCOUNTER — Other Ambulatory Visit: Payer: Self-pay | Admitting: *Deleted

## 2018-12-09 DIAGNOSIS — C187 Malignant neoplasm of sigmoid colon: Secondary | ICD-10-CM

## 2018-12-09 MED ORDER — ALPRAZOLAM 0.25 MG PO TABS: 0.2500 mg | ORAL_TABLET | Freq: Every evening | ORAL | 0 refills | Status: AC | PRN

## 2018-12-09 MED ORDER — OXYCODONE HCL 5 MG PO TABS: 5.0000 mg | ORAL_TABLET | ORAL | 0 refills | Status: DC | PRN

## 2018-12-09 MED ORDER — LIDOCAINE 5 % EX PTCH: 1.0000 | MEDICATED_PATCH | CUTANEOUS | 0 refills | Status: DC

## 2018-12-10 ENCOUNTER — Other Ambulatory Visit: Payer: Self-pay

## 2018-12-10 ENCOUNTER — Other Ambulatory Visit: Payer: Self-pay | Admitting: Gastroenterology

## 2018-12-10 ENCOUNTER — Ambulatory Visit
Admission: RE | Admit: 2018-12-10 | Discharge: 2018-12-10 | Disposition: A | Discharge status: Home / Self Care | Payer: Medicare Other | Source: Ambulatory Visit | Attending: Gastroenterology | Admitting: Gastroenterology

## 2018-12-10 DIAGNOSIS — C187 Malignant neoplasm of sigmoid colon: Secondary | ICD-10-CM

## 2018-12-10 DIAGNOSIS — R109 Unspecified abdominal pain: Secondary | ICD-10-CM | POA: Diagnosis not present

## 2018-12-10 DIAGNOSIS — R933 Abnormal findings on diagnostic imaging of other parts of digestive tract: Secondary | ICD-10-CM

## 2018-12-11 ENCOUNTER — Encounter (HOSPITAL_COMMUNITY): Payer: Self-pay | Admitting: *Deleted

## 2018-12-11 ENCOUNTER — Other Ambulatory Visit: Payer: Self-pay

## 2018-12-11 NOTE — Progress Notes (Signed)
Spoke with Almyra Free in Endoscopy and informed that phone call had been made regarding patient and to please note progress note in epic.

## 2018-12-11 NOTE — Progress Notes (Signed)
Preprocedure phone call completed with [name], daughter in law,  Colletta [place] will bring patient into Admitting and then leave. Phone number- [phone].  [name] will be driver home for 10/17/16.  Patient will be coming in a wheelchair. Patient is HOH and blind in one eye.  Patient will need assistance undressing and dressing with procedure. Patient does not have a port. Patient will need to go home with a Depends. Palliative Care Consult done 11/30/18 per daughter in law. Patient takes pain meds around the clock. Daughter in law states patient has a Lidocaine patch prescription but is not currently using.  SPOKE W/  _ Daughter in law - [name] Tickle     SCREENING SYMPTOMS OF COVID 19:   COUGH--no  RUNNY NOSE--- no  SORE THROAT--no-  NASAL CONGESTION----no  SNEEZING----no  SHORTNESS OF BREATH---no  DIFFICULTY BREATHING---no  TEMP >100.0 -----no  UNEXPLAINED BODY ACHES------no  CHILLS -------- no  HEADACHES ---------no  LOSS OF SMELL/ TASTE --------no    HAVE YOU OR ANY FAMILY MEMBER TRAVELLED PAST 14 DAYS OUT OF THE   COUNTY---no STATE----no COUNTRY----no  HAVE YOU OR ANY FAMILY MEMBER BEEN EXPOSED TO ANYONE WITH COVID 19? no

## 2018-12-14 ENCOUNTER — Ambulatory Visit (HOSPITAL_COMMUNITY): Payer: Medicare Other | Admitting: Certified Registered"

## 2018-12-14 ENCOUNTER — Ambulatory Visit (HOSPITAL_COMMUNITY)
Admission: RE | Admit: 2018-12-14 | Discharge: 2018-12-14 | Disposition: A | Discharge status: Home / Self Care | Payer: Medicare Other | Attending: Gastroenterology | Admitting: Gastroenterology

## 2018-12-14 ENCOUNTER — Encounter (HOSPITAL_COMMUNITY): Payer: Self-pay | Admitting: Gastroenterology

## 2018-12-14 ENCOUNTER — Ambulatory Visit (HOSPITAL_COMMUNITY): Payer: Medicare Other

## 2018-12-14 ENCOUNTER — Encounter (HOSPITAL_COMMUNITY)
Admission: RE | Disposition: A | Discharge status: Home / Self Care | Payer: Self-pay | Source: Home / Self Care | Attending: Gastroenterology

## 2018-12-14 ENCOUNTER — Other Ambulatory Visit: Payer: Self-pay

## 2018-12-14 DIAGNOSIS — K5669 Other partial intestinal obstruction: Secondary | ICD-10-CM | POA: Insufficient documentation

## 2018-12-14 DIAGNOSIS — I714 Abdominal aortic aneurysm, without rupture: Secondary | ICD-10-CM | POA: Insufficient documentation

## 2018-12-14 DIAGNOSIS — K5900 Constipation, unspecified: Secondary | ICD-10-CM | POA: Insufficient documentation

## 2018-12-14 DIAGNOSIS — C19 Malignant neoplasm of rectosigmoid junction: Secondary | ICD-10-CM | POA: Diagnosis not present

## 2018-12-14 DIAGNOSIS — K449 Diaphragmatic hernia without obstruction or gangrene: Secondary | ICD-10-CM | POA: Diagnosis not present

## 2018-12-14 DIAGNOSIS — I4891 Unspecified atrial fibrillation: Secondary | ICD-10-CM | POA: Diagnosis not present

## 2018-12-14 DIAGNOSIS — H3530 Unspecified macular degeneration: Secondary | ICD-10-CM | POA: Diagnosis not present

## 2018-12-14 DIAGNOSIS — C20 Malignant neoplasm of rectum: Secondary | ICD-10-CM | POA: Diagnosis not present

## 2018-12-14 DIAGNOSIS — K219 Gastro-esophageal reflux disease without esophagitis: Secondary | ICD-10-CM | POA: Diagnosis not present

## 2018-12-14 DIAGNOSIS — Z8249 Family history of ischemic heart disease and other diseases of the circulatory system: Secondary | ICD-10-CM | POA: Diagnosis not present

## 2018-12-14 DIAGNOSIS — E785 Hyperlipidemia, unspecified: Secondary | ICD-10-CM | POA: Diagnosis not present

## 2018-12-14 DIAGNOSIS — I10 Essential (primary) hypertension: Secondary | ICD-10-CM | POA: Insufficient documentation

## 2018-12-14 DIAGNOSIS — Z85038 Personal history of other malignant neoplasm of large intestine: Secondary | ICD-10-CM | POA: Diagnosis present

## 2018-12-14 DIAGNOSIS — I4819 Other persistent atrial fibrillation: Secondary | ICD-10-CM | POA: Insufficient documentation

## 2018-12-14 DIAGNOSIS — H9190 Unspecified hearing loss, unspecified ear: Secondary | ICD-10-CM | POA: Insufficient documentation

## 2018-12-14 DIAGNOSIS — H5461 Unqualified visual loss, right eye, normal vision left eye: Secondary | ICD-10-CM | POA: Insufficient documentation

## 2018-12-14 DIAGNOSIS — H409 Unspecified glaucoma: Secondary | ICD-10-CM | POA: Insufficient documentation

## 2018-12-14 DIAGNOSIS — Z7982 Long term (current) use of aspirin: Secondary | ICD-10-CM | POA: Diagnosis not present

## 2018-12-14 HISTORY — PX: COLONIC STENT PLACEMENT: SHX5542

## 2018-12-14 HISTORY — PX: FLEXIBLE SIGMOIDOSCOPY: SHX5431

## 2018-12-14 SURGERY — SIGMOIDOSCOPY, FLEXIBLE
Anesthesia: Monitor Anesthesia Care

## 2018-12-14 MED ORDER — ONDANSETRON HCL 4 MG/2ML IJ SOLN
INTRAMUSCULAR | Status: DC | PRN
  Administered 2018-12-14: 4 mg via INTRAVENOUS

## 2018-12-14 MED ORDER — SODIUM CHLORIDE 0.9 % IV SOLN: INTRAVENOUS | Status: DC

## 2018-12-14 MED ORDER — PROPOFOL 500 MG/50ML IV EMUL
INTRAVENOUS | Status: DC | PRN
  Administered 2018-12-14: 125 ug/kg/min via INTRAVENOUS

## 2018-12-14 MED ORDER — PROPOFOL 10 MG/ML IV BOLUS
INTRAVENOUS | Status: DC | PRN
  Administered 2018-12-14: 30 mg via INTRAVENOUS

## 2018-12-14 MED ORDER — PROMETHAZINE HCL 25 MG/ML IJ SOLN: 6.2500 mg | INTRAMUSCULAR | Status: DC | PRN

## 2018-12-14 MED ORDER — PHENYLEPHRINE 40 MCG/ML (10ML) SYRINGE FOR IV PUSH (FOR BLOOD PRESSURE SUPPORT)
PREFILLED_SYRINGE | INTRAVENOUS | Status: DC | PRN
  Administered 2018-12-14 (×8): 80 ug via INTRAVENOUS

## 2018-12-14 MED ORDER — LIDOCAINE 2% (20 MG/ML) 5 ML SYRINGE
INTRAMUSCULAR | Status: DC | PRN
  Administered 2018-12-14: 80 mg via INTRAVENOUS

## 2018-12-14 MED ORDER — LACTATED RINGERS IV SOLN
INTRAVENOUS | Status: DC
  Administered 2018-12-14: 12:00:00 via INTRAVENOUS

## 2018-12-14 NOTE — Op Note (Signed)
[facility] Patient Name: Mary [name] Procedure Date: 12/14/2018 MRN: [id number] Attending MD: Clarene Essex , MD Date of Birth: 1934/01/03 CSN: [id number] Age: 85 Admit Type: Inpatient Procedure:                Flexible Sigmoidoscopy Indications:              Personal history of malignant neoplasm of the                            colon, For therapy of colonic obstruction from                            tumor ingrowth on previously placed stents Providers:                Clarene Essex, MD, Lurline Del, RN, Elspeth Cho                            Tech., Technician Referring MD:              Medicines:                Propofol total dose 230 mg IV, Ondansetron 4 mg IV Complications:            No immediate complications. Estimated Blood Loss:     Estimated blood loss: none. Procedure:                Pre-Anesthesia Assessment:                           - Prior to the procedure, a History and Physical                            was performed, and patient medications and                            allergies were reviewed. The patient's tolerance of                            previous anesthesia was also reviewed. The risks                            and benefits of the procedure and the sedation                            options and risks were discussed with the patient.                            All questions were answered, and informed consent                            was obtained. Prior Anticoagulants: The patient has                            taken no previous anticoagulant or antiplatelet  agents. ASA Grade Assessment: II - A patient with                            mild systemic disease. After reviewing the risks                            and benefits, the patient was deemed in                            satisfactory condition to undergo the procedure.                           After obtaining informed consent, the scope was             passed under direct vision. The GIF-[id number]                            ([id number]) Olympus ultra slim endoscope was                            introduced through the anus and advanced to the the                            sigmoid colon. The flexible sigmoidoscopy was                            somewhat difficult due to unsatisfactory bowel                            prep. The patient tolerated the procedure well. The                            quality of the bowel preparation was poor. Scope In: 11:50:31 AM Scope Out: 12:14:44 PM Total Procedure Duration: 0 hours 24 minutes 13 seconds  Findings:      An infiltrative partially obstructing mass was found in the proximal       rectum. The mass was circumferential. This was stented with a 23 mm x       10.5 cm WallFlex covered stent under both endoscopic and fluoroscopy       guidance.      Previously placed Wallstents were found in the recto-sigmoid colon       Sporadically exiting the mass      A moderate amount of semi-solid stool was found in the mid sigmoid       colon, interfering with visualization. Lavage of the area was performed       using a small amount of sterile water, resulting in incomplete clearance       with fair visualization.      The exam was otherwise without abnormality. Impression:               - Preparation of the colon was poor.                           - Malignant partially obstructing tumor in the  proximal rectum. Covered prosthesis placed.                           - Stents seen in the colon.                           - Stool in the mid sigmoid colon.                           - The examination was otherwise normal.                           - No specimens collected. Moderate Sedation:      Not Applicable - Patient had care per Anesthesia. Recommendation:           - Clear liquid diet for 6 hours. Then slowly                            advance as tolerated                            - Continue present medications. Try to decrease                            pain medicines to decrease constipation                           - Return to GI clinic PRN.                           - Telephone GI clinic if symptomatic PRN. Procedure Code(s):        --- Professional ---                           [phone], Sigmoidoscopy, flexible; with placement of                            endoscopic stent (includes pre- and post-dilation                            and guide wire passage, when performed) Diagnosis Code(s):        --- Professional ---                           C20, Malignant neoplasm of rectum                           K56.690, Other partial intestinal obstruction                           Z85.038, Personal history of other malignant                            neoplasm of large intestine  K56.609, Unspecified intestinal obstruction,                            unspecified as to partial versus complete                            obstruction CPT copyright 2019 American Medical Association. All rights reserved. The codes documented in this report are preliminary and upon coder review may  be revised to meet current compliance requirements. Clarene Essex, MD 12/14/2018 12:35:31 PM This report has been signed electronically. Number of Addenda: 0

## 2018-12-14 NOTE — Transfer of Care (Signed)
Immediate Anesthesia Transfer of Care Note  Patient: Mary [name]  Procedure(s) Performed: FLEXIBLE SIGMOIDOSCOPY (N/A ) COLONIC STENT PLACEMENT (N/A )  Patient Location: PACU and Endoscopy Unit  Anesthesia Type:MAC  Level of Consciousness: awake, alert  and oriented  Airway & Oxygen Therapy: Patient Spontanous Breathing and Patient connected to face mask oxygen  Post-op Assessment: Report given to RN and Post -op Vital signs reviewed and stable  Post vital signs: Reviewed and stable  Last Vitals:  Vitals Value Taken Time  BP    Temp    Pulse    Resp    SpO2      Last Pain:  Vitals:   12/14/18 1032  TempSrc: Oral  PainSc: 0-No pain         Complications: No apparent anesthesia complications

## 2018-12-14 NOTE — Anesthesia Preprocedure Evaluation (Addendum)
Anesthesia Evaluation  Patient identified by MRN, date of birth, ID band Patient awake    Reviewed: Allergy & Precautions, NPO status , Patient's Chart, lab work & pertinent test results  History of Anesthesia Complications (+) PONV and history of anesthetic complications  Airway Mallampati: III  TM Distance: >3 FB Neck ROM: Full    Dental no notable dental hx. (+) Dental Advisory Given   Pulmonary neg pulmonary ROS,    Pulmonary exam normal breath sounds clear to auscultation       Cardiovascular hypertension, Pt. on medications Normal cardiovascular exam+ dysrhythmias Atrial Fibrillation  Rhythm:Regular Rate:Normal     Neuro/Psych negative neurological ROS     GI/Hepatic Neg liver ROS, hiatal hernia, GERD  Medicated and Controlled,  Endo/Other  negative endocrine ROS  Renal/GU negative Renal ROS     Musculoskeletal negative musculoskeletal ROS (+)   Abdominal   Peds  Hematology negative hematology ROS (+)   Anesthesia Other Findings Day of surgery medications reviewed with the patient.  Reproductive/Obstetrics                            Anesthesia Physical Anesthesia Plan  ASA: II  Anesthesia Plan: MAC   Post-op Pain Management:    Induction:   PONV Risk Score and Plan: 3 and Treatment may vary due to age or medical condition and Propofol infusion  Airway Management Planned: Natural Airway and Simple Face Mask  Additional Equipment:   Intra-op Plan:   Post-operative Plan:   Informed Consent: I have reviewed the patients History and Physical, chart, labs and discussed the procedure including the risks, benefits and alternatives for the proposed anesthesia with the patient or authorized representative who has indicated his/her understanding and acceptance.     Dental advisory given  Plan Discussed with: CRNA  Anesthesia Plan Comments:        Anesthesia Quick  Evaluation

## 2018-12-14 NOTE — Progress Notes (Signed)
Mary [name] 11:11 AM  Subjective: Patient has not been doing well since we last saw her and has had increased pain and constipation and her x-ray was reviewed and she has had decreased energy as well  Objective: No signs stable afebrile no acute distress exam please see preassessment evaluation she has little distended today without obvious guarding or rebound recent x-ray reviewed  Assessment: Metastatic colon cancer with colonic obstruction status post multiple colonic stents  Plan: Okay to proceed with flex sig and possibly repeat stenting with anesthesia assistance  [facility]  Pager [phone] After 5PM or if no answer call [phone]

## 2018-12-14 NOTE — Anesthesia Postprocedure Evaluation (Signed)
Anesthesia Post Note  Patient: Mary [name]  Procedure(s) Performed: FLEXIBLE SIGMOIDOSCOPY (N/A ) COLONIC STENT PLACEMENT (N/A )     Patient location during evaluation: PACU Anesthesia Type: MAC Level of consciousness: awake and alert Pain management: pain level controlled Vital Signs Assessment: post-procedure vital signs reviewed and stable Respiratory status: spontaneous breathing, nonlabored ventilation and respiratory function stable Cardiovascular status: blood pressure returned to baseline and stable Postop Assessment: no apparent nausea or vomiting Anesthetic complications: no    Last Vitals:  Vitals:   12/14/18 1240 12/14/18 1247  BP: 127/72 128/73  Pulse: 93 89  Resp: 16 17  Temp:    SpO2: 95% 95%    Last Pain:  Vitals:   12/14/18 1247  TempSrc:   PainSc: 0-No pain                 Brennan Bailey

## 2018-12-14 NOTE — Discharge Instructions (Signed)
YOU HAD AN ENDOSCOPIC PROCEDURE TODAY: Refer to the procedure report and other information in the discharge instructions given to you for any specific questions about what was found during the examination. If this information does not answer your questions, please call [facility] office at [phone] to clarify.   YOU SHOULD EXPECT: Some feelings of bloating in the abdomen. Passage of more gas than usual. Walking can help get rid of the air that was put into your GI tract during the procedure and reduce the bloating. If you had a lower endoscopy (such as a colonoscopy or flexible sigmoidoscopy) you may notice spotting of blood in your stool or on the toilet paper. Some abdominal soreness may be present for a day or two, also.  DIET: Your first meal following the procedure see below MD instructions.  Heavy or fried foods are harder to digest and may make you feel nauseous or bloated. Drink plenty of fluids but you should avoid alcoholic beverages for 24 hours.  ACTIVITY: Your care partner should take you home directly after the procedure. You should plan to take it easy, moving slowly for the rest of the day. You can resume normal activity the day after the procedure however YOU SHOULD NOT DRIVE, use power tools, machinery or perform tasks that involve climbing or major physical exertion for 24 hours (because of the sedation medicines used during the test).   SYMPTOMS TO REPORT IMMEDIATELY: A gastroenterologist can be reached at any hour. Please call [phone]  for any of the following symptoms:   Following lower endoscopy (colonoscopy, flexible sigmoidoscopy) Excessive amounts of blood in the stool  Significant tenderness, worsening of abdominal pains  Swelling of the abdomen that is new, acute  Fever of 100 or higher    FOLLOW UP:  If any biopsies were taken you will be contacted by phone or by letter within the next 1-3 weeks. Call [phone]  if you have not heard about the biopsies in 3  weeks.  Please also call with any specific questions about appointments or follow up tests.   MD instructions: Clear liquids only for 6 hours and if doing well may have soft solids and continue MiraLAX 2-4 times a day and try to decrease pain medicine to help with constipation and call me if I can be of any further assistance or if any specific GI question or problem

## 2018-12-17 ENCOUNTER — Encounter (HOSPITAL_COMMUNITY): Payer: Self-pay | Admitting: Gastroenterology

## 2019-01-03 ENCOUNTER — Other Ambulatory Visit: Payer: Self-pay | Admitting: Cardiovascular Disease

## 2019-01-05 NOTE — Telephone Encounter (Signed)
Rx request sent to pharmacy.  

## 2019-01-07 ENCOUNTER — Other Ambulatory Visit: Payer: Self-pay | Admitting: Cardiovascular Disease

## 2019-01-08 ENCOUNTER — Telehealth: Payer: Self-pay

## 2019-01-08 NOTE — Telephone Encounter (Signed)
Faxed order to [facility] for [facility] to monitor medication compliance and assess lower extremity edema to [phone]

## 2019-01-08 NOTE — Telephone Encounter (Signed)
Deana NP with Palliative Care calls with some recommendations.  The patient has become more forgetful often times forgetting to take her medications.  She has developed lower extremity edema.  She is recommending we order Home Health to go in an monitor medications and her lower extremity edema.    She just needs a verbal order to do so.  [phone]   Message routed to Dr. Burr Medico

## 2019-01-14 ENCOUNTER — Other Ambulatory Visit: Payer: Self-pay | Admitting: Hematology

## 2019-01-15 ENCOUNTER — Telehealth: Payer: Self-pay

## 2019-01-15 ENCOUNTER — Other Ambulatory Visit: Payer: Self-pay | Admitting: Hematology

## 2019-01-15 DIAGNOSIS — C187 Malignant neoplasm of sigmoid colon: Secondary | ICD-10-CM

## 2019-01-15 MED ORDER — OXYCODONE HCL 5 MG PO TABS: 5.0000 mg | ORAL_TABLET | ORAL | 0 refills | Status: DC | PRN

## 2019-01-15 NOTE — Telephone Encounter (Signed)
Patient calls for refill on Oxycodone 5 mg tablets Last filled 12/09/2018 #120  Send into [facility] in [place] on file.

## 2019-01-20 ENCOUNTER — Telehealth: Payer: Self-pay | Admitting: *Deleted

## 2019-01-20 NOTE — Telephone Encounter (Signed)
Received vm call from [place] stating that pt needs a face to face visit since palliative care recommended home health care.   Returned call to [place] left message to call back.  Spoke with Colletta Maryland, daughter-in-law & she is out of town on a much needed vacation.  Pt's other son is with pt now but they could do a phone visit next week when they return.

## 2019-01-21 ENCOUNTER — Telehealth: Payer: Self-pay | Admitting: Hematology

## 2019-01-21 NOTE — Telephone Encounter (Signed)
Spoke with Colletta Maryland and scheduled appt per 6/10 sch message - aware of appt date and time

## 2019-01-22 ENCOUNTER — Telehealth: Payer: Self-pay | Admitting: *Deleted

## 2019-01-22 NOTE — Telephone Encounter (Signed)
Received message from [place] stating there needs to be an audio & visual meeting & OK by phone & after meeting, send face sheet, H & P, Med list, copy of visit note with statement of need & order to [phone].  She was told the need was med management & disease education.   Notified [name] -daughter-in-law of phone message & she will be the correct # to call & has smart phone. # [phone].  Explained to [place] that she would get a text from Dr Burr Medico & to click on link & should be able to talk & see her.  Message to Dr Burr Medico.

## 2019-01-28 ENCOUNTER — Telehealth: Payer: Self-pay | Admitting: Hematology

## 2019-01-28 NOTE — Telephone Encounter (Signed)
Contacted pt to verify telephone visit for pre reg °

## 2019-01-28 NOTE — Progress Notes (Signed)
[place]   Telephone:([phone]) [phone] Fax:(336) [phone]   Clinic Follow up Note   Patient Care Team: Moshe Cipro, MD as PCP - General (Internal Medicine) Alexis Frock, MD as Consulting Physician (Urology) Michael Boston, MD as Consulting Physician (General Surgery) Toma Deiters, MD as Referring Physician (Gastroenterology)   I connected with Mary [name] on 01/29/2019 at  9:30 AM EDT by video enabled telemedicine visit and verified that I am speaking with the correct person using two identifiers.  I discussed the limitations, risks, security and privacy concerns of performing an evaluation and management service by telephone and the availability of in person appointments. I also discussed with the patient that there may be a patient responsible charge related to this service. The patient expressed understanding and agreed to proceed.   Other persons participating in the visit and their role in the encounter:  Her son and daughter-in-law  Patient's location:  Her home  Provider's location:  My Office   CHIEF COMPLAINT: F/u of colon cancer  SUMMARY OF ONCOLOGIC HISTORY: Oncology History Overview Note  Cancer Staging Cancer of left colon [facility]) Staging form: Colon and Rectum, AJCC 8th Edition - Clinical stage from 09/04/2016: Stage IVB (cTX, cNX, pM1b) - Signed by Truitt Merle, MD on 11/12/2016     Cancer of sigmoid colon ([place])  09/02/2016 Procedure   Colonoscopy Diverticulosis (scattered). Pt has about 10 cm structure starting at 20 cm with abnormal mucosa. Biopsies taken.    09/02/2016 Pathology Results   MODERATELY DIFFERENTIATED ADENOCARCINOMA OF COLON AT 20 CM   09/04/2016 Initial Diagnosis   Cancer of left colon ([place])   09/06/2016 Imaging   CT CAP w Contrast 1. No acute abdominopelvic process.  2. Heterogenous hypodense area involving the medial edge of the left kidney with multiple small soft tissue like stranding involving the left pararenal space.  Findings are suspicious for malignancy of the left kidney with small pararenal metastases. MRI abdomen with contrast should be considered to further evaluate.  3. Vague hypodensity involving the lateral segment of the liver. This can also be reevaluated with MRI.  4. Partially visualized 5 mm posterior right lower lobe nodular density. CT chest without contrast follow up is recommended.  5. Complex multilobular left ovarian lesion may reflect a cluster of cysts.   10/10/2016 Procedure   Operative Report by Dr. Tresa Moore on 10/18/16 Procedure: 1) Cystoscopy, left retrograde pyelogram interpretation. 2) Left diagnostic uteroscopy. 3) Insertion of left uteral stent; 5 x 24 Polaris with tether. Findings: 1) Unremarkable bladder. 2) Unremarkable left retrograde pyelogram. 3) No evidence of intraluminal urothelial neoplasm whatsoever with insepction of the left kidney and ureter times several. 4) Successful placement of left ureteral stent, proximal in renal pelvis and distal in urinary bladder.   10/15/2016 Imaging   MRI abdomen pelvis 1. There are 2 enhancing hepatic lesions highly worrisome for metastatic disease, likely metastatic colon cancer. 2. The lesion of concern in the anterior suprahilar lip of the left kidney is also enhancing, worrisome for neoplasm. This has a somewhat atypical appearance for renal cell carcinoma and could reflect a metastasis as well. 3. New left-sided hydroureter with delayed contrast excretion consistent with distal ureteral obstruction. Specific etiology uncertain. 4. Complex cystic and solid left adnexal lesion could reflect cystic neoplasm, although does not appear aggressive or appear to reflect the cause of the distal left ureteral obstruction. 5. Sigmoid colon wall thickening could be related to reported newly diagnosed colon cancer.   11/06/2016 PET scan  PET 11/06/2016 IMPRESSION: 1. Hypermetabolic pulmonary and hepatic lesions, most consistent with  metastatic colon cancer, with the hypermetabolic primary located in the sigmoid colon. 2. Left renal and left adnexal lesions, better seen and evaluated on 10/15/2016. 3. Persistent moderate left hydronephrosis. 4. Aortic atherosclerosis (ICD10-170.0).   11/11/2016 Pathology Results   Liver, needle/core biopsy, left lobe - METASTATIC ADENOCARCINOMA. The histologic features are consistent with a primary gastrointestinal adenocarcinoma.   05/19/2017 Imaging   CT Abdomen Pelvis, 05/19/2017 IMPRESSION: 1. Worsening pulmonary and hepatic metastatic disease. Primary sigmoid lesion is stable to minimally enlarged. 2. Stable to slight enlargement of a left perinephric mass, likely a metastasis. Renal cell carcinoma is not excluded. Associated pelvocaliectasis versus mild hydronephrosis. 3. Borderline left periaortic lymph nodes, slightly enlarged. 4. Cholelithiasis. 5.  Aortic atherosclerosis (ICD10-170.0). 6. Cystic and solid lesion in the left adnexa, as on prior exams, likely ovarian in origin.   06/06/2017 - 01/16/2018 Chemotherapy   IV antibody Vectibix every [redacted] weeks along with oral chemo Xeloda for 1 week on and 1 week off starting 06/06/17. Due to skin toxicities we reduced Xeloda to 2000 mg in the AM and '1500mg'$  in the PM 1 week on and 1 week off on 10/31/17. She had multiple treatment break due to hospitalization. Restarted treatment on 12/26/17. Chemo break 01/16/18-02/20/18   08/07/2017 - 08/16/2017 Hospital Admission   Admit date: 08/07/17 Admission diagnosis: Bowel Obstruction  Additional comments: She was admitted to the hospital on 08/07/17 after [facility] due to a bowel obstruction.  She had a sigmoid colon stent placement on 08/11/17. Bowel obstruction resolved.  CT scan in hospital showed great partial response to chemotherapy. She will continue chemotherapy given good overall tolerance.     08/07/2017 Imaging   CT AP W Contrast 08/07/17 IMPRESSION:  Low colonic obstruction secondary to a rectosigmoid stricture at the site of the patient's prior colonic mass, which is no longer evident on CT. Mild upstream colonic wall thickening raises the possibility of superimposed colitis.  Improving hepatic metastases, as above. Left perirenal metastasis, decreased. Visualized pulmonary metastases at the lung bases are grossly unchanged.  3.5 cm mixed cystic/solid left ovarian mass is mildly decreased.    08/11/2017 Procedure   By Dr. Watt Climes  Flexible Sigmoidoscopy 08/11/17 IMPRESSION:   - Preparation of the colon was fair. - Likely malignant partially obstructing tumor in the recto-sigmoid colon versus necrotic scarring. Injected with contrast proximal to obstruction to confirm proper position. Prosthesis placed. - Stool in the rectum. - No specimens collected.    10/22/2017 Imaging   CT CAP W Contrast 10/22/17 IMPRESSION: 1. Significant interval improvement and liver metastasis. 2. The left perinephric mass is slightly decreased in size in the interval. 3. No significant interval change in the appearance of multiple pulmonary metastasis. 4. Stable appearance of mediastinal adenopathy. 5. Gallstones 6.  Aortic Atherosclerosis (ICD10-I70.0). 7. Colonic stent remains in place without evidence for bowel obstruction.   12/01/2017 - 12/07/2017 Hospital Admission   Admit date: 12/01/17 Admission diagnosis: Bowel Obstruction and abdominal pain Additional comments: had flexible sigmoidoscopy by Dr. Watt Climes on 12/04/17. Chemo was held during this time.    12/04/2017 Procedure   Flexible Sigmoidoscopy 12/04/17 IMPRESSION - Preparation of the colon was fair. - Stent in the colon. - Stricture in the distal sigmoid colon. Prosthesis placed. - No specimens collected.   02/18/2018 Imaging   CT CAP W Contrast 02/18/18 IMPRESSION: CT CHEST IMPRESSION 1. Bilateral pulmonary metastasis. Compared to the 12/01/2017 abdominal CT,  mixed response to  therapy. Compared to the 10/22/2017 chest CT, overall slight progression. 2. Similar thoracic adenopathy. 3.  Aortic Atherosclerosis (ICD10-I70.0). CT ABDOMEN AND PELVIS IMPRESSION 1. Since 12/01/2017, similar hepatic metastatic burden. Left renal/perirenal lesion is similar to minimally decreased in size. 2. Sigmoid colonic stent in place with resolution of colonic obstruction. 3. Similar mild soft tissue fullness within both ovaries.   02/20/2018 -  Chemotherapy   She started second lineIrinotecan and Vectibix every 2 weeks on 02/20/18.She did not tolerated very well with significant fatigue, and chemo dose was reduced to '90mg'$ /m2. She is tolerating better now. Chemo has been held since 08/10/18 due to tolerance issue, and not restarted due to disease progression on scan 09/18/2018    Mar 08, 202019 Imaging   IMPRESSION: -Stable distal colonic stent with eccentric soft tissue thickening on the right. No evidence of bowel obstruction. -Improving pulmonary and hepatic metastases, with index lesions as above. -Improving thoracic lymphadenopathy. -Stable fullness of the bilateral ovaries. -Stable enhancing lesion in the medial interpolar left kidney.    09/18/2018 Imaging   CT CAP W contrast 09/18/18 IMPRESSION: Stable rectosigmoid colonic stent with adjacent soft tissue. No evidence of true colonic obstruction.  Mildly progressive hepatic metastases, as above.  Progressive pulmonary metastases, as above.  Mildly progressive enhancing lesion in the medial left kidney.  Additional stable ancillary findings as above.    12/14/2018 Surgery   FLEXIBLE SIGMOIDOSCOPY and COLONIC STENT PLACEMENT by Dr Watt Climes 12/14/18       CURRENT THERAPY:  Supportive care  INTERVAL HISTORY:  Mary [name] is here for a follow up post stent placement. She was able to identify herself by face to face video. She notes she is doing well since her stent placed on 12/14/18. She is able to be more active and  still independently take care of herself. She notes periods of depression and despair with family matters. She notes she still lives alone. She has friends and now working on her house which is helping her.  She is '10mg'$  of oxycodone BID and needs a refill. She notes not having as much sleep at night so she naps during the day. She notes palliative care comes once a month. She declines Hospice for now. She takes Miralax apophylactically and her BMs are controlled.     REVIEW OF SYSTEMS:   Constitutional: Denies fevers, chills or abnormal weight loss Eyes: Denies blurriness of vision Ears, nose, mouth, throat, and face: Denies mucositis or sore throat Respiratory: Denies cough, dyspnea or wheezes Cardiovascular: Denies palpitation, chest discomfort or lower extremity swelling Gastrointestinal:  Denies nausea, heartburn or change in bowel habits Skin: Denies abnormal skin rashes Lymphatics: Denies new lymphadenopathy or easy bruising Neurological:Denies numbness, tingling or new weaknesses Behavioral/Psych: Mood is stable, no new changes (+) depressive mood improving  All other systems were reviewed with the patient and are negative.  MEDICAL HISTORY:  Past Medical History:  Diagnosis Date  . Aneurysm of ascending aorta (H[facility]) 12/03/2016   3.7 cm by echo 10/2016  . Colon cancer [facility])    dx via biospy from colonoscopy-- malignant ---  currently having work-up done w/ dr gross (general surgeon) and coordinate surgery w/ urologsit for renal tumor  . Essential hypertension 10/31/2016  . GERD (gastroesophageal reflux disease)   . Glaucoma   . Hiatal hernia   . HOH (hard of hearing)    bilateral even w/ hearing aids  . Hyperlipidemia   . Kidney tumor    left side  .  Legally blind in right eye, as defined in [place]    due to macular degeneration  . Macular degeneration   . Nausea    intermittant due to colon mass  . Ovarian cyst   . Persistent atrial fibrillation 10/31/2016   on ASA only  .  PONV (postoperative nausea and vomiting)   . Wears glasses   . Wears hearing aid    bilateral    SURGICAL HISTORY: Past Surgical History:  Procedure Laterality Date  . BREAST CYST EXCISION  1990   benign  . COLONIC STENT PLACEMENT N/A 12/04/2017   Procedure: COLONIC STENT PLACEMENT;  Surgeon: Clarene Essex, MD;  Location: [facility];  Service: Endoscopy;  Laterality: N/A;  . COLONIC STENT PLACEMENT N/A 06/25/2018   Procedure: COLONIC STENT PLACEMENT;  Surgeon: Clarene Essex, MD;  Location: [facility];  Service: Endoscopy;  Laterality: N/A;  . COLONIC STENT PLACEMENT N/A 10/19/2018   Procedure: COLONIC STENT PLACEMENT HAVE 9 AND 12 STENT AVAILABLE;  Surgeon: Clarene Essex, MD;  Location: [facility];  Service: Endoscopy;  Laterality: N/A;  . COLONIC STENT PLACEMENT N/A 12/14/2018   Procedure: COLONIC STENT PLACEMENT;  Surgeon: Clarene Essex, MD;  Location: [facility];  Service: Endoscopy;  Laterality: N/A;  . COLONOSCOPY  09/04/2016  . CYSTOSCOPY WITH RETROGRADE PYELOGRAM, URETEROSCOPY AND STENT PLACEMENT Left 10/10/2016   Procedure: CYSTOSCOPY WITH RETROGRADE PYELOGRAM, DIAGNOSTIC URETEROSCOPY  AND STENT PLACEMENT;  Surgeon: Alexis Frock, MD;  Location[facility];  Service: Urology;  Laterality: Left;  . ESOPHAGOGASTRODUODENOSCOPY  11/07/2014  . FLEXIBLE SIGMOIDOSCOPY N/A 08/11/2017   Procedure: FLEXIBLE SIGMOIDOSCOPY;  Surgeon: Clarene Essex, MD;  Location: [facility];  Service: Endoscopy;  Laterality: N/A;  With Fluoroscopy for colonic stent placement for colonic stricture  . FLEXIBLE SIGMOIDOSCOPY N/A 12/04/2017   Procedure: FLEXIBLE SIGMOIDOSCOPY;  Surgeon: Clarene Essex, MD;  Location: [facility];  Service: Endoscopy;  Laterality: N/A;  . FLEXIBLE SIGMOIDOSCOPY N/A 06/25/2018   Procedure: FLEXIBLE SIGMOIDOSCOPY WITH STENT PLACEMENT;  Surgeon: Clarene Essex, MD;  Location: [facility];  Service: Endoscopy;  Laterality: N/A;  with stent placement  . FLEXIBLE SIGMOIDOSCOPY N/A  10/19/2018   Procedure: FLEXIBLE SIGMOIDOSCOPY;  Surgeon: Clarene Essex, MD;  Location: [facility];  Service: Endoscopy;  Laterality: N/A;  . FLEXIBLE SIGMOIDOSCOPY N/A 12/14/2018   Procedure: FLEXIBLE SIGMOIDOSCOPY;  Surgeon: Clarene Essex, MD;  Location: [facility];  Service: Endoscopy;  Laterality: N/A;  needs colonic stent uncovered 9 cm X22 mm and esophageal covered stent 10 cmX 22/25  . NASAL SINUS SURGERY    . TONSILLECTOMY      I have reviewed the social history and family history with the patient and they are unchanged from previous note.  ALLERGIES:  is allergic to benazepril; tylenol [acetaminophen]; and bactrim [sulfamethoxazole-trimethoprim].  MEDICATIONS:  Current Outpatient Medications  Medication Sig Dispense Refill  . ALPRAZolam (XANAX) 0.25 MG tablet Take 1-2 tablets (0.25-0.5 mg total) by mouth at bedtime as needed for anxiety or sleep. 60 tablet 0  . amLODipine (NORVASC) 5 MG tablet Take 1 tablet (5 mg total) by mouth daily. 90 tablet 1  . aspirin EC 81 MG tablet Take 81 mg by mouth daily.    . bisacodyl (BISACODYL) 5 MG EC tablet Take 10 mg by mouth 2 (two) times a day.     . bisacodyl (DULCOLAX) 10 MG suppository Place 10 mg rectally daily as needed for moderate constipation.    Marland Kitchen diltiazem (CARDIZEM CD) 240 MG 24 hr capsule TAKE 1 CAPSULE BY MOUTH EVERY  DAY 90 capsule 2  . ergocalciferol (VITAMIN D2) 1.25 MG (50000 UT) capsule Take 50,000 Units by mouth once a week.    . escitalopram (LEXAPRO) 10 MG tablet Take 10 mg by mouth daily.    . fexofenadine (ALLEGRA) 180 MG tablet Take 180 mg by mouth daily as needed for allergies.     . furosemide (LASIX) 40 MG tablet Take 1 tablet (40 mg total) by mouth daily. Contact our office for an appointment (Patient taking differently: Take 40 mg by mouth daily as needed for fluid. Contact our office for an appointment) 90 tablet 0  . latanoprost (XALATAN) 0.005 % ophthalmic solution Place 1 drop into both eyes at bedtime.    .  lidocaine (LIDODERM) 5 % PLACE 1 PATCH ONTO THE SKIN DAILY. REMOVE & DISCARD PATCH WITHIN 12 HOURS OR AS DIRECTED BY MD 30 patch 0  . losartan (COZAAR) 100 MG tablet TAKE 1 TABLET BY MOUTH  DAILY 90 tablet 0  . Multiple Vitamins-Minerals (VITEYES AREDS ADVANCED) CAPS Take 1 capsule by mouth daily.    Marland Kitchen omeprazole (PRILOSEC) 40 MG capsule Take 40 mg by mouth daily.    . ondansetron (ZOFRAN) 4 MG tablet Take 4 mg by mouth every 8 (eight) hours as needed for nausea or vomiting.    . ondansetron (ZOFRAN-ODT) 4 MG disintegrating tablet DISSOLVE 1 TABLET ON THE  TONGUE EVERY 8 HOURS AS  NEEDED FOR NAUSEA AND  VOMITING (Patient taking differently: Take 4 mg by mouth every 8 (eight) hours as needed for nausea or vomiting. ) 180 tablet 1  . oxyCODONE (OXY IR/ROXICODONE) 5 MG immediate release tablet Take 1-2 tablets (5-10 mg total) by mouth every 6 (six) hours as needed for moderate pain or severe pain. 120 tablet 0  . Polyethyl Glycol-Propyl Glycol (SYSTANE) 0.4-0.3 % SOLN Place 1 drop into both eyes 3 (three) times daily as needed (dry eyes).     . polyethylene glycol (MIRALAX / GLYCOLAX) packet Take 17 g by mouth daily as needed for moderate constipation. (Patient taking differently: Take 17 g by mouth 4 (four) times daily. ) 30 each 0  . Potassium Chloride ER 20 MEQ TBCR Take 20 mEq by mouth daily. (Patient taking differently: Take 20 mEq by mouth daily as needed (with lasix). ) 30 tablet 0  . prochlorperazine (COMPAZINE) 10 MG tablet Take 1 tablet (10 mg total) by mouth every 6 (six) hours as needed for nausea or vomiting. 30 tablet 0  . sennosides-docusate sodium (SENOKOT-S) 8.6-50 MG tablet Take 1-2 tablets by mouth 3 (three) times daily.     No current facility-administered medications for this visit.     PHYSICAL EXAMINATION: ECOG PERFORMANCE STATUS: 3 - Symptomatic, >50% confined to bed  No vitals taken today, Exam not performed today  LABORATORY DATA:  I have reviewed the data as listed CBC  Latest Ref Rng & Units 09/21/2018 08/10/2018 07/27/2018  WBC 4.0 - 10.5 K/uL 15.6(H) 8.5 8.5  Hemoglobin 12.0 - 15.0 g/dL 10.4(L) 10.6(L) 10.5(L)  Hematocrit 36.0 - 46.0 % 33.1(L) 33.6(L) 34.0(L)  Platelets 150 - 400 K/uL 453(H) 409(H) 395     CMP Latest Ref Rng & Units 09/21/2018 08/10/2018 07/27/2018  Glucose 70 - 99 mg/dL 91 100(H) 98  BUN 8 - 23 mg/dL _0 Creatinine 0.44 - 1.00 mg/dL 0.95 0.85 0.80  Sodium 135 - 145 mmol/L 138 138 140  Potassium 3.5 - 5.1 mmol/L 4.2 4.2 3.7  Chloride 98 - 111 mmol/L 104 105 106  CO2 22 - 32 mmol/L _0 Calcium 8.9 - 10.3 mg/dL 9.5 9.0 9.2  Total Protein 6.5 - 8.1 g/dL 7.9 6.9 7.4  Total Bilirubin 0.3 - 1.2 mg/dL 0.7 0.4 0.3  Alkaline Phos 38 - 126 U/L 84 77 99  AST 15 - 41 U/L 15 13(L) 16  ALT 0 - 44 U/L _1 RADIOGRAPHIC STUDIES: I have personally reviewed the radiological images as listed and agreed with the findings in the report. No results found.   ASSESSMENT & PLAN:  Mary [name] is a 85 y.o. female with   1. Adenocarcinoma of sigmoid colon, TxNxM1 with liver and lung mets, stage IV, KRAS/NRS wild type, MSI-stable -Diagnosed in 08/2016. She had disease progression on multiple line therapy  -Given her poor tolerance and disease progression, she has stopped chemo after last dose o 08/10/2018. She is not interested in proceeding with more treatment which is understanding.   -She underwent sigmoidoscopy and had stent placed on 12/14/18 again by Dr. Watt Climes for her bowel obstruction. Has recovered well -She is clinically stable, able to do ADLs and function at home,  her recent depressive mood is improving.  -She is currently on palliative care which sees her once a month. I offered her Hospice care which would be more involved. She declined hospice care for now. I think she would benefit more frequent home care. Pt and her family would like to continue palliative care and add home health to see her more often, likely every  1-2 weeks. Patient is agreeable.  -f/u as needed    2.Recurrent partial bowel obstruction secondary to sigmoid colon mass, status post stent placement on 08/11/17, 12/04/2017 and 06/25/2018 -Related to her tumor. -She was treated with multiple stent placements by Dr. Watt Climes. She is open to surgery ifno other option -she is on laxities,we previously discussed low residue diet. -She underwent another sigmoidoscopy and had stent placed on 12/14/18. Has recovered well. -Due to recurrent bowel obstruction she is on 63m of oxycodone BID. I refilled today.  -She will continue prophylactic Miralax daily to avoid constipation or slow bowels as this can contribute to SBO.BMs are currently well controlled.   3. Insomnia  -On Xanax as need -Stable.  4.Goal of care discussion, DRN/DNI  -We previously discussed the incurable nature of her metastatic cancer, and the overall poor prognosis, especially if she does not have good response to chemotherapy or progress on chemo -The patient understands the goal of care is palliative. -I recommended DNR/DNI, she agreed and signed for a DNR/DNI.    Plan -Refill oxycodone  -will increase palliative home care frequency to every 1-2 weeks, my nurse talked to them  -F/u as needed    No problem-specific Assessment & Plan notes found for this encounter.   No orders of the defined types were placed in this encounter.  I discussed the assessment and treatment plan with the patient. The patient was provided an opportunity to ask questions and all were answered. The patient agreed with the plan and demonstrated an understanding of the instructions.  The patient was advised to call back or seek an in-person evaluation if the symptoms worsen or if the condition fails to improve as anticipated.   I provided 20 minutes of face-to-face video visit time during this encounter, and > 50% was spent counseling as documented under my assessment & plan.    YTruitt Merle MD 01/29/2019   I, AJoslyn Devon am  acting as scribe for Truitt Merle, MD.   I have reviewed the above documentation for accuracy and completeness, and I agree with the above.

## 2019-01-29 ENCOUNTER — Encounter: Payer: Self-pay | Admitting: Hematology

## 2019-01-29 ENCOUNTER — Inpatient Hospital Stay: Payer: Medicare Other | Attending: Hematology | Admitting: Hematology

## 2019-01-29 ENCOUNTER — Telehealth: Payer: Self-pay

## 2019-01-29 ENCOUNTER — Telehealth: Payer: Self-pay | Admitting: Hematology

## 2019-01-29 DIAGNOSIS — Z79899 Other long term (current) drug therapy: Secondary | ICD-10-CM | POA: Diagnosis not present

## 2019-01-29 DIAGNOSIS — Z7982 Long term (current) use of aspirin: Secondary | ICD-10-CM | POA: Diagnosis not present

## 2019-01-29 DIAGNOSIS — C187 Malignant neoplasm of sigmoid colon: Secondary | ICD-10-CM

## 2019-01-29 DIAGNOSIS — Z9221 Personal history of antineoplastic chemotherapy: Secondary | ICD-10-CM

## 2019-01-29 DIAGNOSIS — I10 Essential (primary) hypertension: Secondary | ICD-10-CM | POA: Diagnosis not present

## 2019-01-29 MED ORDER — OXYCODONE HCL 5 MG PO TABS: 5.0000 mg | ORAL_TABLET | Freq: Four times a day (QID) | ORAL | 0 refills | Status: DC | PRN

## 2019-01-29 NOTE — Telephone Encounter (Signed)
Faxed over office note from 6/19 along with order to [facility] to provide home health SN/PT/OT or increase palliative care visits for this patient.  Faxed to [phone]

## 2019-01-29 NOTE — Telephone Encounter (Signed)
Per 6/19 los F/u as needed

## 2019-02-01 ENCOUNTER — Telehealth: Payer: Self-pay | Admitting: *Deleted

## 2019-02-01 DIAGNOSIS — D49512 Neoplasm of unspecified behavior of left kidney: Secondary | ICD-10-CM | POA: Diagnosis not present

## 2019-02-01 DIAGNOSIS — I10 Essential (primary) hypertension: Secondary | ICD-10-CM | POA: Diagnosis not present

## 2019-02-01 DIAGNOSIS — H5440 Blindness, one eye, unspecified eye: Secondary | ICD-10-CM | POA: Diagnosis not present

## 2019-02-01 DIAGNOSIS — I712 Thoracic aortic aneurysm, without rupture: Secondary | ICD-10-CM | POA: Diagnosis not present

## 2019-02-01 DIAGNOSIS — R531 Weakness: Secondary | ICD-10-CM | POA: Diagnosis not present

## 2019-02-01 DIAGNOSIS — G4700 Insomnia, unspecified: Secondary | ICD-10-CM | POA: Diagnosis not present

## 2019-02-01 DIAGNOSIS — H9193 Unspecified hearing loss, bilateral: Secondary | ICD-10-CM | POA: Diagnosis not present

## 2019-02-01 DIAGNOSIS — C7801 Secondary malignant neoplasm of right lung: Secondary | ICD-10-CM | POA: Diagnosis not present

## 2019-02-01 DIAGNOSIS — Z79899 Other long term (current) drug therapy: Secondary | ICD-10-CM | POA: Diagnosis not present

## 2019-02-01 DIAGNOSIS — Z9181 History of falling: Secondary | ICD-10-CM | POA: Diagnosis not present

## 2019-02-01 DIAGNOSIS — H3530 Unspecified macular degeneration: Secondary | ICD-10-CM | POA: Diagnosis not present

## 2019-02-01 DIAGNOSIS — C186 Malignant neoplasm of descending colon: Secondary | ICD-10-CM | POA: Diagnosis not present

## 2019-02-01 DIAGNOSIS — K219 Gastro-esophageal reflux disease without esophagitis: Secondary | ICD-10-CM | POA: Diagnosis not present

## 2019-02-01 DIAGNOSIS — Z7982 Long term (current) use of aspirin: Secondary | ICD-10-CM | POA: Diagnosis not present

## 2019-02-01 DIAGNOSIS — I4819 Other persistent atrial fibrillation: Secondary | ICD-10-CM | POA: Diagnosis not present

## 2019-02-01 DIAGNOSIS — C7802 Secondary malignant neoplasm of left lung: Secondary | ICD-10-CM | POA: Diagnosis not present

## 2019-02-01 DIAGNOSIS — Z79891 Long term (current) use of opiate analgesic: Secondary | ICD-10-CM | POA: Diagnosis not present

## 2019-02-01 DIAGNOSIS — F329 Major depressive disorder, single episode, unspecified: Secondary | ICD-10-CM | POA: Diagnosis not present

## 2019-02-01 DIAGNOSIS — E785 Hyperlipidemia, unspecified: Secondary | ICD-10-CM | POA: Diagnosis not present

## 2019-02-01 DIAGNOSIS — C787 Secondary malignant neoplasm of liver and intrahepatic bile duct: Secondary | ICD-10-CM | POA: Diagnosis not present

## 2019-02-01 NOTE — Telephone Encounter (Signed)
Erasmo Downer from [facility] in [place], [place] called to inform office that pt is accepted by [facility] for nursing, PT/OT services. [facility]in[facility]    Phone     [phone].

## 2019-02-09 DIAGNOSIS — I10 Essential (primary) hypertension: Secondary | ICD-10-CM | POA: Diagnosis not present

## 2019-02-09 DIAGNOSIS — C7802 Secondary malignant neoplasm of left lung: Secondary | ICD-10-CM | POA: Diagnosis not present

## 2019-02-09 DIAGNOSIS — C7801 Secondary malignant neoplasm of right lung: Secondary | ICD-10-CM | POA: Diagnosis not present

## 2019-02-09 DIAGNOSIS — C787 Secondary malignant neoplasm of liver and intrahepatic bile duct: Secondary | ICD-10-CM | POA: Diagnosis not present

## 2019-02-09 DIAGNOSIS — C186 Malignant neoplasm of descending colon: Secondary | ICD-10-CM | POA: Diagnosis not present

## 2019-02-09 DIAGNOSIS — I4819 Other persistent atrial fibrillation: Secondary | ICD-10-CM | POA: Diagnosis not present

## 2019-02-22 DIAGNOSIS — C7802 Secondary malignant neoplasm of left lung: Secondary | ICD-10-CM | POA: Diagnosis not present

## 2019-02-22 DIAGNOSIS — C186 Malignant neoplasm of descending colon: Secondary | ICD-10-CM | POA: Diagnosis not present

## 2019-02-22 DIAGNOSIS — C7801 Secondary malignant neoplasm of right lung: Secondary | ICD-10-CM | POA: Diagnosis not present

## 2019-02-22 DIAGNOSIS — I4819 Other persistent atrial fibrillation: Secondary | ICD-10-CM | POA: Diagnosis not present

## 2019-02-22 DIAGNOSIS — I10 Essential (primary) hypertension: Secondary | ICD-10-CM | POA: Diagnosis not present

## 2019-02-22 DIAGNOSIS — C787 Secondary malignant neoplasm of liver and intrahepatic bile duct: Secondary | ICD-10-CM | POA: Diagnosis not present

## 2019-02-23 DIAGNOSIS — C186 Malignant neoplasm of descending colon: Secondary | ICD-10-CM | POA: Diagnosis not present

## 2019-02-25 ENCOUNTER — Other Ambulatory Visit: Payer: Self-pay | Admitting: Nurse Practitioner

## 2019-02-25 ENCOUNTER — Telehealth: Payer: Self-pay

## 2019-02-25 DIAGNOSIS — C187 Malignant neoplasm of sigmoid colon: Secondary | ICD-10-CM

## 2019-02-25 MED ORDER — OXYCODONE HCL 5 MG PO TABS: 5.0000 mg | ORAL_TABLET | Freq: Four times a day (QID) | ORAL | 0 refills | Status: DC | PRN

## 2019-02-25 MED ORDER — LIDOCAINE 5 % EX PTCH: 1.0000 | MEDICATED_PATCH | CUTANEOUS | 0 refills | Status: DC

## 2019-02-25 NOTE — Telephone Encounter (Signed)
Patient calls for refills on Oxycodone she is taking one to two every 4 hours prn, and Lidocaine patches.  Please send into her pharmacy on file.  [phone]    _________________________ Message given to Cira Rue NP

## 2019-03-03 DIAGNOSIS — C7802 Secondary malignant neoplasm of left lung: Secondary | ICD-10-CM | POA: Diagnosis not present

## 2019-03-03 DIAGNOSIS — Z7982 Long term (current) use of aspirin: Secondary | ICD-10-CM | POA: Diagnosis not present

## 2019-03-03 DIAGNOSIS — H5440 Blindness, one eye, unspecified eye: Secondary | ICD-10-CM | POA: Diagnosis not present

## 2019-03-03 DIAGNOSIS — G4700 Insomnia, unspecified: Secondary | ICD-10-CM | POA: Diagnosis not present

## 2019-03-03 DIAGNOSIS — Z9181 History of falling: Secondary | ICD-10-CM | POA: Diagnosis not present

## 2019-03-03 DIAGNOSIS — Z79899 Other long term (current) drug therapy: Secondary | ICD-10-CM | POA: Diagnosis not present

## 2019-03-03 DIAGNOSIS — C7801 Secondary malignant neoplasm of right lung: Secondary | ICD-10-CM | POA: Diagnosis not present

## 2019-03-03 DIAGNOSIS — E785 Hyperlipidemia, unspecified: Secondary | ICD-10-CM | POA: Diagnosis not present

## 2019-03-03 DIAGNOSIS — K219 Gastro-esophageal reflux disease without esophagitis: Secondary | ICD-10-CM | POA: Diagnosis not present

## 2019-03-03 DIAGNOSIS — Z79891 Long term (current) use of opiate analgesic: Secondary | ICD-10-CM | POA: Diagnosis not present

## 2019-03-03 DIAGNOSIS — I4819 Other persistent atrial fibrillation: Secondary | ICD-10-CM | POA: Diagnosis not present

## 2019-03-03 DIAGNOSIS — I712 Thoracic aortic aneurysm, without rupture: Secondary | ICD-10-CM | POA: Diagnosis not present

## 2019-03-03 DIAGNOSIS — H9193 Unspecified hearing loss, bilateral: Secondary | ICD-10-CM | POA: Diagnosis not present

## 2019-03-03 DIAGNOSIS — R531 Weakness: Secondary | ICD-10-CM | POA: Diagnosis not present

## 2019-03-03 DIAGNOSIS — C787 Secondary malignant neoplasm of liver and intrahepatic bile duct: Secondary | ICD-10-CM | POA: Diagnosis not present

## 2019-03-03 DIAGNOSIS — D49512 Neoplasm of unspecified behavior of left kidney: Secondary | ICD-10-CM | POA: Diagnosis not present

## 2019-03-03 DIAGNOSIS — F329 Major depressive disorder, single episode, unspecified: Secondary | ICD-10-CM | POA: Diagnosis not present

## 2019-03-03 DIAGNOSIS — C186 Malignant neoplasm of descending colon: Secondary | ICD-10-CM | POA: Diagnosis not present

## 2019-03-03 DIAGNOSIS — H3530 Unspecified macular degeneration: Secondary | ICD-10-CM | POA: Diagnosis not present

## 2019-03-03 DIAGNOSIS — I10 Essential (primary) hypertension: Secondary | ICD-10-CM | POA: Diagnosis not present

## 2019-03-08 DIAGNOSIS — C186 Malignant neoplasm of descending colon: Secondary | ICD-10-CM | POA: Diagnosis not present

## 2019-03-08 DIAGNOSIS — C7801 Secondary malignant neoplasm of right lung: Secondary | ICD-10-CM | POA: Diagnosis not present

## 2019-03-08 DIAGNOSIS — I4819 Other persistent atrial fibrillation: Secondary | ICD-10-CM | POA: Diagnosis not present

## 2019-03-08 DIAGNOSIS — I10 Essential (primary) hypertension: Secondary | ICD-10-CM | POA: Diagnosis not present

## 2019-03-08 DIAGNOSIS — C7802 Secondary malignant neoplasm of left lung: Secondary | ICD-10-CM | POA: Diagnosis not present

## 2019-03-08 DIAGNOSIS — C787 Secondary malignant neoplasm of liver and intrahepatic bile duct: Secondary | ICD-10-CM | POA: Diagnosis not present

## 2019-03-09 ENCOUNTER — Telehealth: Payer: Self-pay | Admitting: *Deleted

## 2019-03-09 NOTE — Telephone Encounter (Signed)
Received vm call from [facility] stating she saw pt yest & pt is requesting a cane, thinking that it might help her getting up & down.  Anderson Malta notes that she does have a walker but pt request cane.  She would like an order if Dr Burr Medico agreeable.  Message routed to Dr Feng/Pod RN

## 2019-03-09 NOTE — Telephone Encounter (Signed)
Yes, I agree with ordering a cane for her, let me know if prescription is needed.   Truitt Merle MD

## 2019-03-17 ENCOUNTER — Telehealth: Payer: Self-pay

## 2019-03-17 NOTE — Telephone Encounter (Signed)
Faxed script for cane to [facility] [phone]

## 2019-03-17 NOTE — Telephone Encounter (Signed)
Called & left message with Jennifer/[facility] that Dr Burr Medico is OK with Mary [name] for this pt & to let us know if she needs a written script.

## 2019-03-18 ENCOUNTER — Telehealth: Payer: Self-pay

## 2019-03-18 NOTE — Telephone Encounter (Signed)
Faxed script for cane to [facility] on 03/17/2019 fax [phone], phone number [phone], sent to HIM for scan to chart.

## 2019-03-22 ENCOUNTER — Other Ambulatory Visit: Payer: Self-pay | Admitting: Nurse Practitioner

## 2019-03-22 ENCOUNTER — Telehealth: Payer: Self-pay

## 2019-03-22 DIAGNOSIS — C787 Secondary malignant neoplasm of liver and intrahepatic bile duct: Secondary | ICD-10-CM | POA: Diagnosis not present

## 2019-03-22 DIAGNOSIS — I4819 Other persistent atrial fibrillation: Secondary | ICD-10-CM | POA: Diagnosis not present

## 2019-03-22 DIAGNOSIS — C7802 Secondary malignant neoplasm of left lung: Secondary | ICD-10-CM | POA: Diagnosis not present

## 2019-03-22 DIAGNOSIS — C186 Malignant neoplasm of descending colon: Secondary | ICD-10-CM | POA: Diagnosis not present

## 2019-03-22 DIAGNOSIS — C187 Malignant neoplasm of sigmoid colon: Secondary | ICD-10-CM

## 2019-03-22 DIAGNOSIS — I10 Essential (primary) hypertension: Secondary | ICD-10-CM | POA: Diagnosis not present

## 2019-03-22 DIAGNOSIS — C7801 Secondary malignant neoplasm of right lung: Secondary | ICD-10-CM | POA: Diagnosis not present

## 2019-03-22 MED ORDER — NYSTATIN 100000 UNIT/ML MT SUSP: 5.0000 mL | Freq: Three times a day (TID) | OROMUCOSAL | 0 refills | Status: AC

## 2019-03-22 MED ORDER — MAGIC MOUTHWASH W/LIDOCAINE: 5.0000 mL | Freq: Three times a day (TID) | ORAL | 0 refills | Status: AC

## 2019-03-22 NOTE — Telephone Encounter (Signed)
I didn't see your message first and sent in Nystatin rinse

## 2019-03-22 NOTE — Telephone Encounter (Signed)
Could you call in magic mouth wash for her?   Truitt Merle MD

## 2019-03-22 NOTE — Telephone Encounter (Signed)
Home health nurse calls stating patient appears to have thrush (mouth), she is requesting something be sent in for this to her pharmacy.  [phone]

## 2019-03-24 ENCOUNTER — Telehealth: Payer: Self-pay

## 2019-03-24 NOTE — Telephone Encounter (Signed)
Patient needs written script for [place] mailed to her home address, done today.

## 2019-03-24 NOTE — Telephone Encounter (Signed)
Mailed original script for cane to patient's home address.

## 2019-03-25 ENCOUNTER — Other Ambulatory Visit: Payer: Self-pay | Admitting: Hematology

## 2019-03-25 ENCOUNTER — Telehealth: Payer: Self-pay

## 2019-03-25 DIAGNOSIS — C187 Malignant neoplasm of sigmoid colon: Secondary | ICD-10-CM

## 2019-03-25 MED ORDER — OXYCODONE HCL 5 MG PO TABS: 5.0000 mg | ORAL_TABLET | Freq: Four times a day (QID) | ORAL | 0 refills | Status: DC | PRN

## 2019-03-25 NOTE — Telephone Encounter (Signed)
Patient calls for refill on Oxycodone, last filled 02/25/2019 #120, please send into [facility] in [place], [place] on file.

## 2019-03-25 NOTE — Telephone Encounter (Signed)
Refill called in, thanks   Truitt Merle MD

## 2019-03-26 ENCOUNTER — Other Ambulatory Visit: Payer: Self-pay | Admitting: Cardiovascular Disease

## 2019-03-29 ENCOUNTER — Other Ambulatory Visit: Payer: Self-pay | Admitting: Gastroenterology

## 2019-03-29 ENCOUNTER — Telehealth: Payer: Self-pay | Admitting: *Deleted

## 2019-03-29 NOTE — Telephone Encounter (Signed)
I spoke with pt's daughter-in-law. Pt is having more pain, abdominal distention and constipation, probably from bowel obstruction from her colon cancer again. She has talked to Dr. Watt Climes and is scheduled to have colonoscopy and stent placement again this Friday. She did not feel she wants to come in to ED at this point, she is under local palliative care/hospice now. The local palliative care program does not have residential hospice. She agrees to call me and transfer her to residential hospice in [place] if her symptoms became not manageable at home. At this time, will increase her oxycodone to 5-10mg  every 3-4 hours as needed. She appreciated my call.  Truitt Merle  03/29/2019

## 2019-03-29 NOTE — Telephone Encounter (Signed)
Received vm call from daughter-in-law, Colletta [place] stating pt/nurse had requested increase in pain med but only refill sent in.  She is having pain, weak & failing rapidly.  They are in process of getting Hospice.  She is having trouble with not going to [facility] (BM) which they expected but wants to know if they need to call Dr Watt Climes.  Discussed with  Dr Burr Medico. Notified pt's Colletta Maryland OK to increase oxycodone to 2 every 4 hours prn.  Pt had been taking one every 6 but has started taking 2 early this am & not lasting full 6 hours but now resting & getting some relief.  Pt should discuss BM with Dr Watt Climes & OK for Hospice when pt is ready.  Colletta Maryland will discuss with [facility].

## 2019-03-29 NOTE — Telephone Encounter (Signed)
Received vm call from daughter-in law, Colletta [place] stating pt is not really comfortable.  She has taken 2 tabs of oxycodone @ 9:30 AM & 1:30 PM & states that this took the edge off but not completely gone.  Informed Dr Burr Medico & she will talk with Colletta Maryland.

## 2019-03-30 ENCOUNTER — Other Ambulatory Visit (HOSPITAL_COMMUNITY)
Admission: RE | Admit: 2019-03-30 | Discharge: 2019-03-30 | Disposition: A | Discharge status: Home / Self Care | Payer: Medicare Other | Source: Ambulatory Visit | Attending: Gastroenterology | Admitting: Gastroenterology

## 2019-03-30 DIAGNOSIS — Z20828 Contact with and (suspected) exposure to other viral communicable diseases: Secondary | ICD-10-CM | POA: Insufficient documentation

## 2019-03-30 DIAGNOSIS — C787 Secondary malignant neoplasm of liver and intrahepatic bile duct: Secondary | ICD-10-CM | POA: Diagnosis not present

## 2019-03-30 DIAGNOSIS — C7802 Secondary malignant neoplasm of left lung: Secondary | ICD-10-CM | POA: Diagnosis not present

## 2019-03-30 DIAGNOSIS — Z01812 Encounter for preprocedural laboratory examination: Secondary | ICD-10-CM | POA: Insufficient documentation

## 2019-03-30 DIAGNOSIS — I4819 Other persistent atrial fibrillation: Secondary | ICD-10-CM | POA: Diagnosis not present

## 2019-03-30 DIAGNOSIS — C7801 Secondary malignant neoplasm of right lung: Secondary | ICD-10-CM | POA: Diagnosis not present

## 2019-03-30 DIAGNOSIS — C186 Malignant neoplasm of descending colon: Secondary | ICD-10-CM | POA: Diagnosis not present

## 2019-03-30 DIAGNOSIS — I10 Essential (primary) hypertension: Secondary | ICD-10-CM | POA: Diagnosis not present

## 2019-03-30 LAB — SARS CORONAVIRUS 2 (TAT 6-24 HRS): SARS Coronavirus 2: NEGATIVE

## 2019-04-01 DIAGNOSIS — G894 Chronic pain syndrome: Secondary | ICD-10-CM | POA: Diagnosis not present

## 2019-04-02 ENCOUNTER — Encounter (HOSPITAL_COMMUNITY): Admission: RE | Payer: Self-pay | Source: Home / Self Care

## 2019-04-02 ENCOUNTER — Ambulatory Visit (HOSPITAL_COMMUNITY): Admission: RE | Admit: 2019-04-02 | Payer: Medicare Other | Source: Home / Self Care | Admitting: Gastroenterology

## 2019-04-02 SURGERY — COLONOSCOPY WITH PROPOFOL
Anesthesia: Monitor Anesthesia Care

## 2019-04-06 ENCOUNTER — Other Ambulatory Visit: Payer: Self-pay | Admitting: Nurse Practitioner

## 2019-04-06 ENCOUNTER — Other Ambulatory Visit: Payer: Self-pay | Admitting: Hematology

## 2019-04-06 ENCOUNTER — Other Ambulatory Visit: Payer: Self-pay | Admitting: Medical

## 2019-04-06 ENCOUNTER — Telehealth: Payer: Self-pay

## 2019-04-06 MED ORDER — OXYCODONE HCL 10 MG PO TABS: 10.0000 mg | ORAL_TABLET | ORAL | 0 refills | Status: DC | PRN

## 2019-04-06 MED ORDER — LIDOCAINE 5 % EX PTCH: 1.0000 | MEDICATED_PATCH | CUTANEOUS | 0 refills | Status: AC

## 2019-04-06 NOTE — Telephone Encounter (Signed)
Patient's daughter in law [name] calls needing refill on Oxycodone - since Dr. Burr Medico increased dose can a new script be sent in for Oxycodone 10 mg tablets.  Also she has bee using Lidocaine patches and needs a refill on these.  She states she does find some benefit from them.   The patient is wanting to know her prognosis.  She told her she doubted Dr. Burr Medico would be able to tell her anything since she had not seen in her in some time.

## 2019-04-06 NOTE — Telephone Encounter (Signed)
I have called and spoke with Mary [name]. [name] is taking oxyodone 10mg  every 4 hours as needed, average 40mg  daily. I prescribe oxycodone 10mg  #120. We discuss if she needs more than 50mg  daily, I will add MS contin or Oxycontin. She is not eating much, but drinking adequately. She will have a new care giver at home to help her. She is having BM, so she did not need stent placement again by Dr. Watt Climes lately. Will continue supportive care. She appreciated the call.   Truitt Merle MD

## 2019-04-09 ENCOUNTER — Telehealth: Payer: Self-pay

## 2019-04-09 NOTE — Telephone Encounter (Signed)
Spoke with Minna Antis with [facility], and per her the family is now wanting Hospice Services.  An order was faxed over to [facility] for this.

## 2019-04-09 NOTE — Telephone Encounter (Signed)
[facility] NP calls stating the son is requesting Dr. Burr Medico write a script for a wheelchair as she is no longer as ambulatory as before, much weaker.  He will come pick it up when ready.  [phone]

## 2019-04-14 DIAGNOSIS — I712 Thoracic aortic aneurysm, without rupture: Secondary | ICD-10-CM | POA: Diagnosis not present

## 2019-04-14 DIAGNOSIS — Z9181 History of falling: Secondary | ICD-10-CM | POA: Diagnosis not present

## 2019-04-14 DIAGNOSIS — C7902 Secondary malignant neoplasm of left kidney and renal pelvis: Secondary | ICD-10-CM | POA: Diagnosis not present

## 2019-04-14 DIAGNOSIS — Z79899 Other long term (current) drug therapy: Secondary | ICD-10-CM | POA: Diagnosis not present

## 2019-04-14 DIAGNOSIS — I10 Essential (primary) hypertension: Secondary | ICD-10-CM | POA: Diagnosis not present

## 2019-04-14 DIAGNOSIS — H5440 Blindness, one eye, unspecified eye: Secondary | ICD-10-CM | POA: Diagnosis not present

## 2019-04-14 DIAGNOSIS — H3530 Unspecified macular degeneration: Secondary | ICD-10-CM | POA: Diagnosis not present

## 2019-04-14 DIAGNOSIS — Z7982 Long term (current) use of aspirin: Secondary | ICD-10-CM | POA: Diagnosis not present

## 2019-04-14 DIAGNOSIS — F329 Major depressive disorder, single episode, unspecified: Secondary | ICD-10-CM | POA: Diagnosis not present

## 2019-04-14 DIAGNOSIS — C7802 Secondary malignant neoplasm of left lung: Secondary | ICD-10-CM | POA: Diagnosis not present

## 2019-04-14 DIAGNOSIS — K219 Gastro-esophageal reflux disease without esophagitis: Secondary | ICD-10-CM | POA: Diagnosis not present

## 2019-04-14 DIAGNOSIS — C7801 Secondary malignant neoplasm of right lung: Secondary | ICD-10-CM | POA: Diagnosis not present

## 2019-04-14 DIAGNOSIS — C787 Secondary malignant neoplasm of liver and intrahepatic bile duct: Secondary | ICD-10-CM | POA: Diagnosis not present

## 2019-04-14 DIAGNOSIS — C186 Malignant neoplasm of descending colon: Secondary | ICD-10-CM | POA: Diagnosis not present

## 2019-04-14 DIAGNOSIS — H9193 Unspecified hearing loss, bilateral: Secondary | ICD-10-CM | POA: Diagnosis not present

## 2019-04-14 DIAGNOSIS — G4700 Insomnia, unspecified: Secondary | ICD-10-CM | POA: Diagnosis not present

## 2019-04-14 DIAGNOSIS — E785 Hyperlipidemia, unspecified: Secondary | ICD-10-CM | POA: Diagnosis not present

## 2019-04-14 DIAGNOSIS — I4819 Other persistent atrial fibrillation: Secondary | ICD-10-CM | POA: Diagnosis not present

## 2019-04-15 ENCOUNTER — Telehealth: Payer: Self-pay

## 2019-04-15 NOTE — Telephone Encounter (Signed)
Received call from hospice RN stating they admitted her to Hospice services yesterday.

## 2019-04-16 DIAGNOSIS — C7802 Secondary malignant neoplasm of left lung: Secondary | ICD-10-CM | POA: Diagnosis not present

## 2019-04-16 DIAGNOSIS — C7801 Secondary malignant neoplasm of right lung: Secondary | ICD-10-CM | POA: Diagnosis not present

## 2019-04-16 DIAGNOSIS — I10 Essential (primary) hypertension: Secondary | ICD-10-CM | POA: Diagnosis not present

## 2019-04-16 DIAGNOSIS — C186 Malignant neoplasm of descending colon: Secondary | ICD-10-CM | POA: Diagnosis not present

## 2019-04-16 DIAGNOSIS — C787 Secondary malignant neoplasm of liver and intrahepatic bile duct: Secondary | ICD-10-CM | POA: Diagnosis not present

## 2019-04-16 DIAGNOSIS — C7902 Secondary malignant neoplasm of left kidney and renal pelvis: Secondary | ICD-10-CM | POA: Diagnosis not present

## 2019-04-20 DIAGNOSIS — C787 Secondary malignant neoplasm of liver and intrahepatic bile duct: Secondary | ICD-10-CM | POA: Diagnosis not present

## 2019-04-20 DIAGNOSIS — C186 Malignant neoplasm of descending colon: Secondary | ICD-10-CM | POA: Diagnosis not present

## 2019-04-20 DIAGNOSIS — C7902 Secondary malignant neoplasm of left kidney and renal pelvis: Secondary | ICD-10-CM | POA: Diagnosis not present

## 2019-04-20 DIAGNOSIS — C7801 Secondary malignant neoplasm of right lung: Secondary | ICD-10-CM | POA: Diagnosis not present

## 2019-04-20 DIAGNOSIS — C7802 Secondary malignant neoplasm of left lung: Secondary | ICD-10-CM | POA: Diagnosis not present

## 2019-04-20 DIAGNOSIS — I10 Essential (primary) hypertension: Secondary | ICD-10-CM | POA: Diagnosis not present

## 2019-04-21 ENCOUNTER — Telehealth: Payer: Self-pay

## 2019-04-21 ENCOUNTER — Other Ambulatory Visit: Payer: Self-pay | Admitting: Hematology

## 2019-04-21 MED ORDER — OXYCODONE HCL 10 MG PO TABS: 10.0000 mg | ORAL_TABLET | ORAL | 0 refills | Status: DC | PRN

## 2019-04-21 NOTE — Telephone Encounter (Signed)
Telephone call from Ramond Dial from [facility] stating Pt. Needed a refill for oxycodone. Medication was refilled on 04/06/19 and might be too soon to refill message left to Wells Guiles to confirm how many pills Pt. Is taking per day. Awaiting return call. In basket sent to Dr. Burr Medico as well

## 2019-04-25 DIAGNOSIS — C787 Secondary malignant neoplasm of liver and intrahepatic bile duct: Secondary | ICD-10-CM | POA: Diagnosis not present

## 2019-04-25 DIAGNOSIS — I10 Essential (primary) hypertension: Secondary | ICD-10-CM | POA: Diagnosis not present

## 2019-04-25 DIAGNOSIS — C7802 Secondary malignant neoplasm of left lung: Secondary | ICD-10-CM | POA: Diagnosis not present

## 2019-04-25 DIAGNOSIS — C7902 Secondary malignant neoplasm of left kidney and renal pelvis: Secondary | ICD-10-CM | POA: Diagnosis not present

## 2019-04-25 DIAGNOSIS — C186 Malignant neoplasm of descending colon: Secondary | ICD-10-CM | POA: Diagnosis not present

## 2019-04-25 DIAGNOSIS — C7801 Secondary malignant neoplasm of right lung: Secondary | ICD-10-CM | POA: Diagnosis not present

## 2019-04-26 ENCOUNTER — Telehealth: Payer: Self-pay

## 2019-04-26 NOTE — Telephone Encounter (Signed)
Faxed signed orders back to [facility]. Sent to [facility] for scan to chart.

## 2019-04-28 DIAGNOSIS — I10 Essential (primary) hypertension: Secondary | ICD-10-CM | POA: Diagnosis not present

## 2019-04-28 DIAGNOSIS — C787 Secondary malignant neoplasm of liver and intrahepatic bile duct: Secondary | ICD-10-CM | POA: Diagnosis not present

## 2019-04-28 DIAGNOSIS — C7902 Secondary malignant neoplasm of left kidney and renal pelvis: Secondary | ICD-10-CM | POA: Diagnosis not present

## 2019-04-28 DIAGNOSIS — C7801 Secondary malignant neoplasm of right lung: Secondary | ICD-10-CM | POA: Diagnosis not present

## 2019-04-28 DIAGNOSIS — C186 Malignant neoplasm of descending colon: Secondary | ICD-10-CM | POA: Diagnosis not present

## 2019-04-28 DIAGNOSIS — C7802 Secondary malignant neoplasm of left lung: Secondary | ICD-10-CM | POA: Diagnosis not present

## 2019-04-29 DIAGNOSIS — I10 Essential (primary) hypertension: Secondary | ICD-10-CM | POA: Diagnosis not present

## 2019-04-29 DIAGNOSIS — C186 Malignant neoplasm of descending colon: Secondary | ICD-10-CM | POA: Diagnosis not present

## 2019-04-29 DIAGNOSIS — C7802 Secondary malignant neoplasm of left lung: Secondary | ICD-10-CM | POA: Diagnosis not present

## 2019-04-29 DIAGNOSIS — C7902 Secondary malignant neoplasm of left kidney and renal pelvis: Secondary | ICD-10-CM | POA: Diagnosis not present

## 2019-04-29 DIAGNOSIS — C787 Secondary malignant neoplasm of liver and intrahepatic bile duct: Secondary | ICD-10-CM | POA: Diagnosis not present

## 2019-04-29 DIAGNOSIS — C7801 Secondary malignant neoplasm of right lung: Secondary | ICD-10-CM | POA: Diagnosis not present

## 2019-04-30 ENCOUNTER — Other Ambulatory Visit: Payer: Self-pay

## 2019-04-30 ENCOUNTER — Telehealth: Payer: Self-pay

## 2019-04-30 ENCOUNTER — Other Ambulatory Visit: Payer: Self-pay | Admitting: Hematology

## 2019-04-30 DIAGNOSIS — C787 Secondary malignant neoplasm of liver and intrahepatic bile duct: Secondary | ICD-10-CM | POA: Diagnosis not present

## 2019-04-30 DIAGNOSIS — C7802 Secondary malignant neoplasm of left lung: Secondary | ICD-10-CM | POA: Diagnosis not present

## 2019-04-30 DIAGNOSIS — C186 Malignant neoplasm of descending colon: Secondary | ICD-10-CM | POA: Diagnosis not present

## 2019-04-30 DIAGNOSIS — I10 Essential (primary) hypertension: Secondary | ICD-10-CM | POA: Diagnosis not present

## 2019-04-30 DIAGNOSIS — C7801 Secondary malignant neoplasm of right lung: Secondary | ICD-10-CM | POA: Diagnosis not present

## 2019-04-30 DIAGNOSIS — C7902 Secondary malignant neoplasm of left kidney and renal pelvis: Secondary | ICD-10-CM | POA: Diagnosis not present

## 2019-04-30 DIAGNOSIS — B370 Candidal stomatitis: Secondary | ICD-10-CM

## 2019-04-30 MED ORDER — MORPHINE SULFATE ER 30 MG PO TBCR: 30.0000 mg | EXTENDED_RELEASE_TABLET | Freq: Two times a day (BID) | ORAL | 0 refills | Status: AC

## 2019-04-30 MED ORDER — OXYCODONE HCL 10 MG PO TABS: 10.0000 mg | ORAL_TABLET | ORAL | 0 refills | Status: AC | PRN

## 2019-04-30 NOTE — Telephone Encounter (Signed)
Received voicemail from [place] with [facility] stating that patient's current pain regimen is not managing her pain effectively and to see if Dr. Burr Medico was willing to either increase the dose of patient's Oxycodone prescription or the frequency. Updated Dr. Burr Medico who stated she approves increasing her Oxycodone to 10mg  as well as writing a prescription for a long acting pain medication if the Hospice team thinks the patient would benefit from it.   Called Wells Guiles back and left a voicemail with the above information. Wells Guiles returned call and stated that she did think patient would benefit from a long acting pain medication. She also asked that both the new Oxycodone prescription and long acting prescription be sent to [facility]. Dr. Burr Medico e-scribed 10 mg Oxycodone q3 hours and 30 mg MS Contin q12 hours to said pharmacy. Called pharmacy at 15:25 and confirmed with their pharmacist that they had received both prescriptions. Left voicemail with Wells Guiles that prescriptions are available for pick up today and Dr. Ernestina Penna office will continue to support as needed.

## 2019-05-02 DIAGNOSIS — C787 Secondary malignant neoplasm of liver and intrahepatic bile duct: Secondary | ICD-10-CM | POA: Diagnosis not present

## 2019-05-02 DIAGNOSIS — I10 Essential (primary) hypertension: Secondary | ICD-10-CM | POA: Diagnosis not present

## 2019-05-02 DIAGNOSIS — C7902 Secondary malignant neoplasm of left kidney and renal pelvis: Secondary | ICD-10-CM | POA: Diagnosis not present

## 2019-05-02 DIAGNOSIS — C7801 Secondary malignant neoplasm of right lung: Secondary | ICD-10-CM | POA: Diagnosis not present

## 2019-05-02 DIAGNOSIS — C7802 Secondary malignant neoplasm of left lung: Secondary | ICD-10-CM | POA: Diagnosis not present

## 2019-05-02 DIAGNOSIS — C186 Malignant neoplasm of descending colon: Secondary | ICD-10-CM | POA: Diagnosis not present

## 2019-05-13 DEATH — deceased

## 2019-09-10 IMAGING — CT CT ABD-PELV W/ CM
2 of 5 series · 15 of 46 positions shown, 17 images · IV contrast (ISOVUE 300)
Comparison: PET 11/06/2016 and MR abdomen pelvis 10/15/2016.

CLINICAL DATA: Colon cancer with intermittent left lower quadrant
pain.

EXAM:
CT ABDOMEN AND PELVIS WITH CONTRAST
TECHNIQUE: Multidetector CT imaging of the abdomen and pelvis was performed
using the standard protocol following bolus administration of
intravenous contrast.
CONTRAST:  100mL 3AYAYU-899 IOPAMIDOL ([id number]-899) INJECTION 61%

[Series 2: axial st · axial · 0.69mm/px · z∈[+1234,+1654]mm · 12 of 96 slices shown, 14 images]
[im 6/96  soft-tissue]
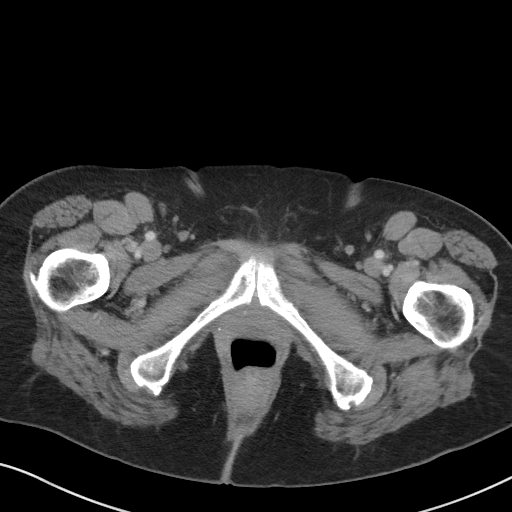
[im 6/96  bone]
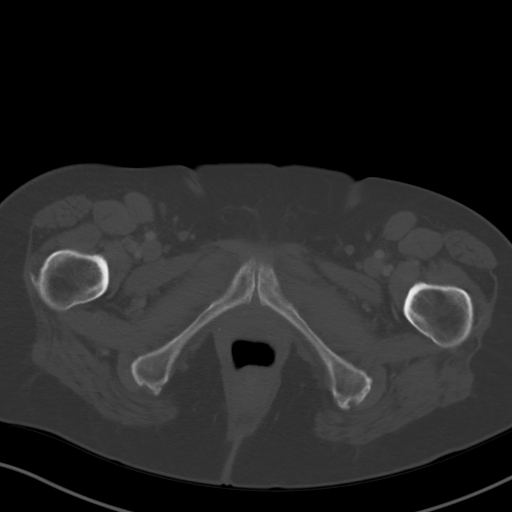
[im 12/96  soft-tissue]
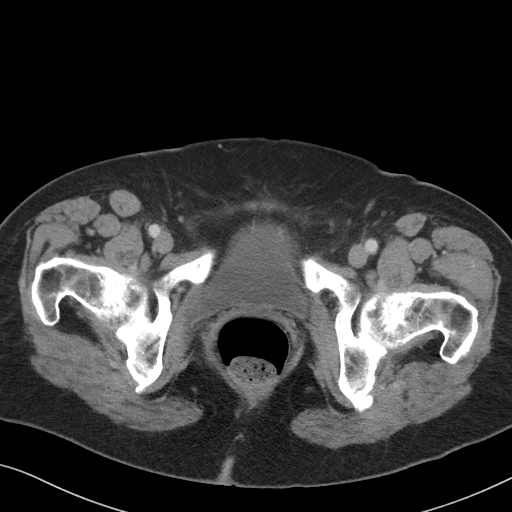
[im 24/96  soft-tissue]
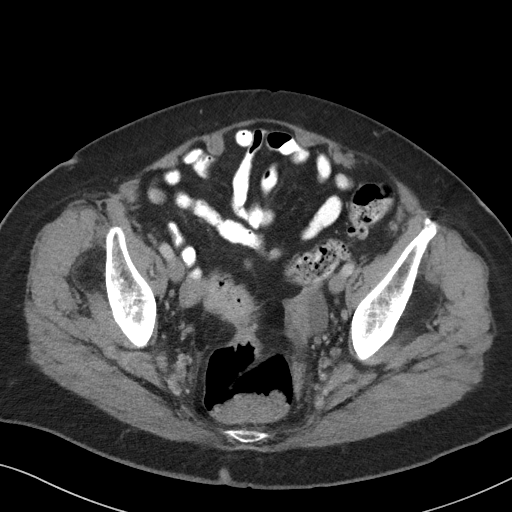
[im 30/96  soft-tissue]
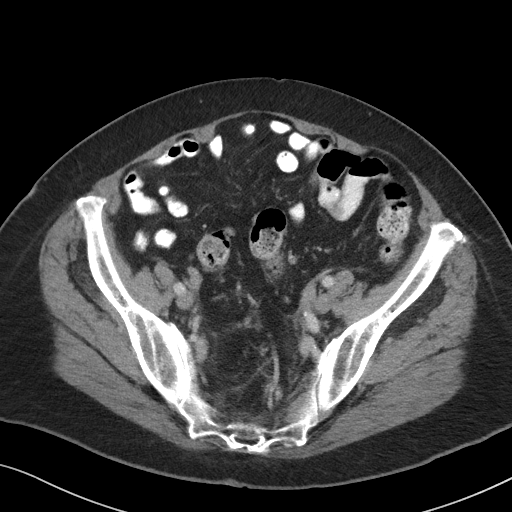
[im 36/96  soft-tissue]
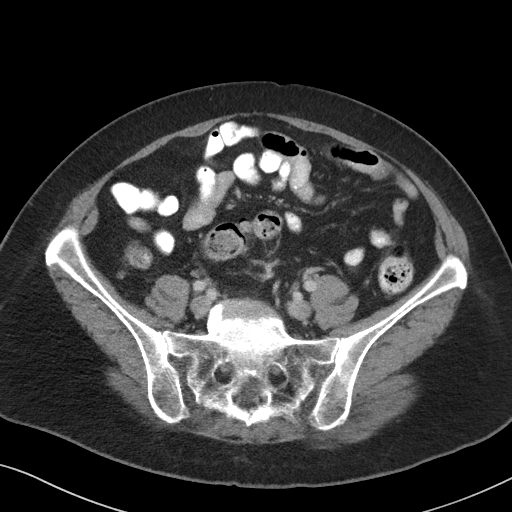
[im 42/96  soft-tissue]
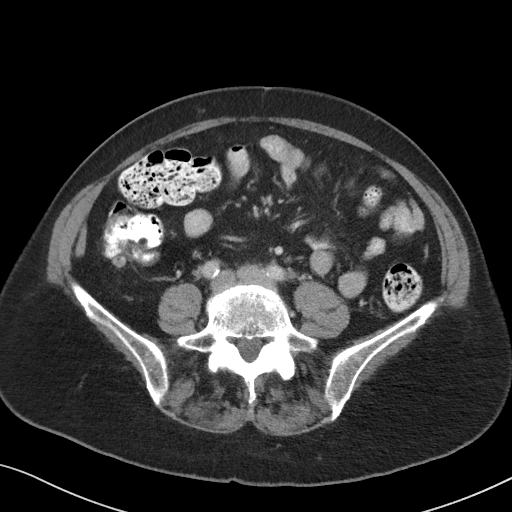
[im 54/96  soft-tissue]
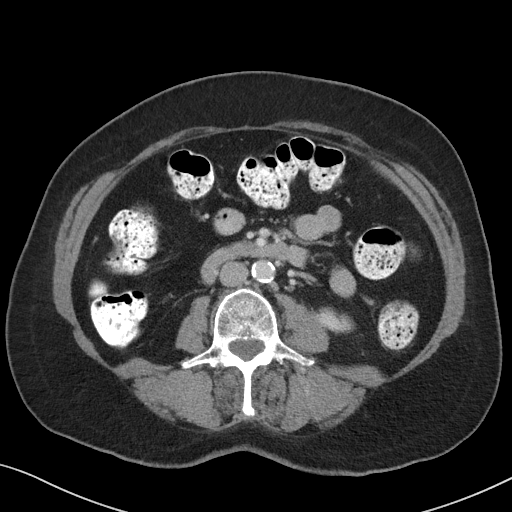
[im 60/96  soft-tissue]
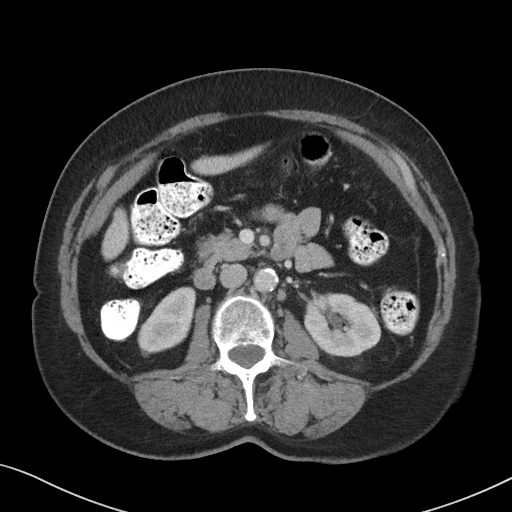
[im 66/96  soft-tissue]
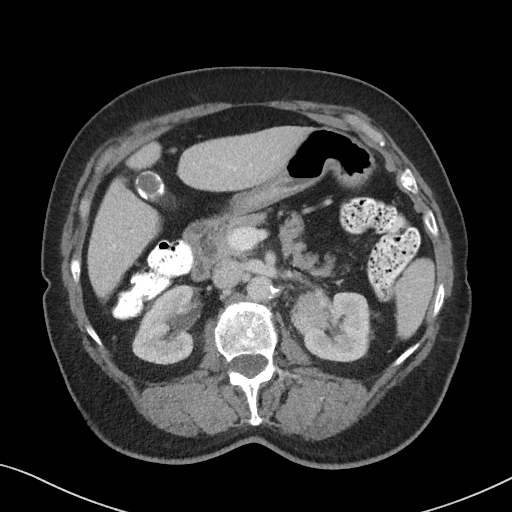
[im 66/96  bone]
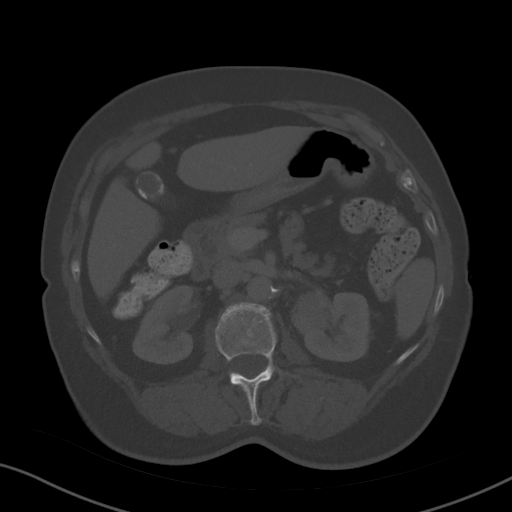
[im 72/96  soft-tissue]
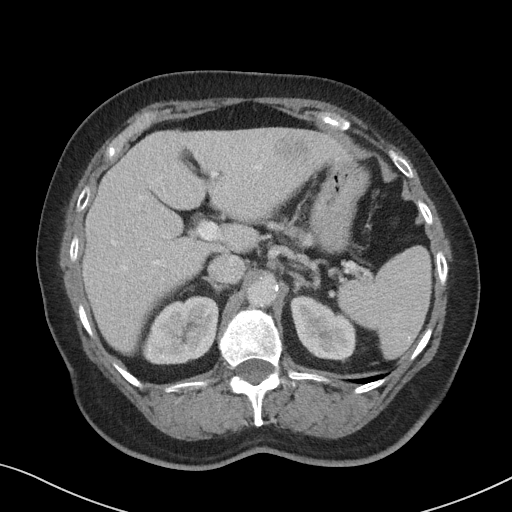
[im 84/96  soft-tissue]
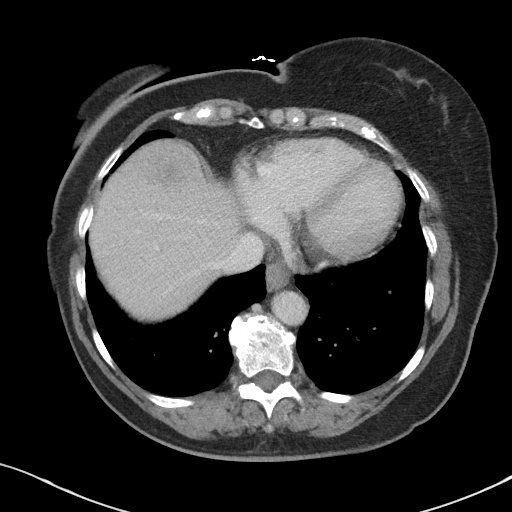
[im 90/96  soft-tissue]
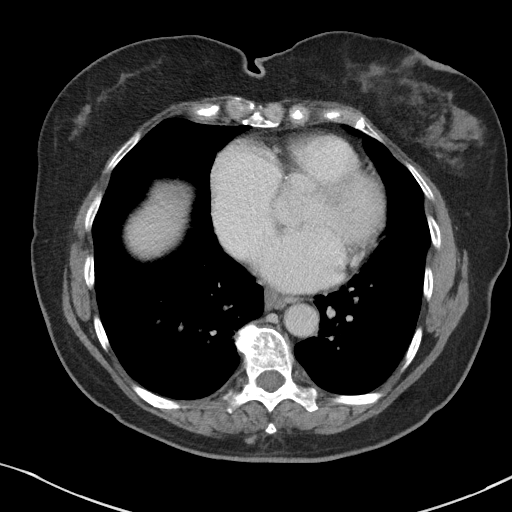

[Series 4: coronal st · coronal · 0.86mm/px · 3 of 90 slices shown]
[im 30/90  soft-tissue]
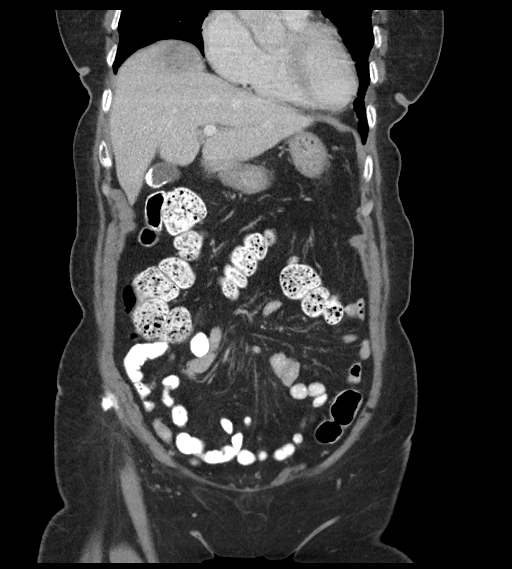
[im 40/90  soft-tissue]
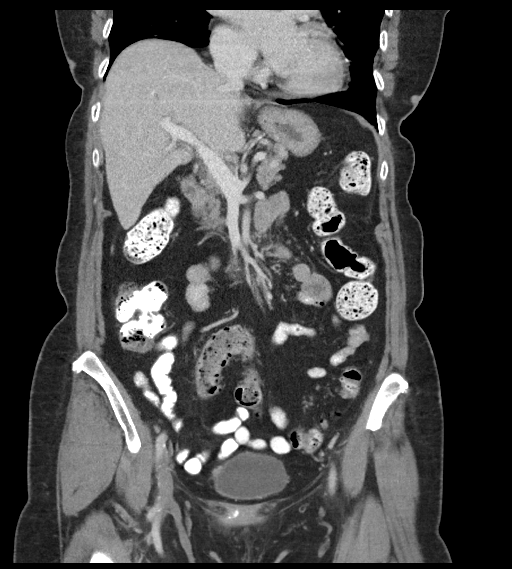
[im 50/90  soft-tissue]
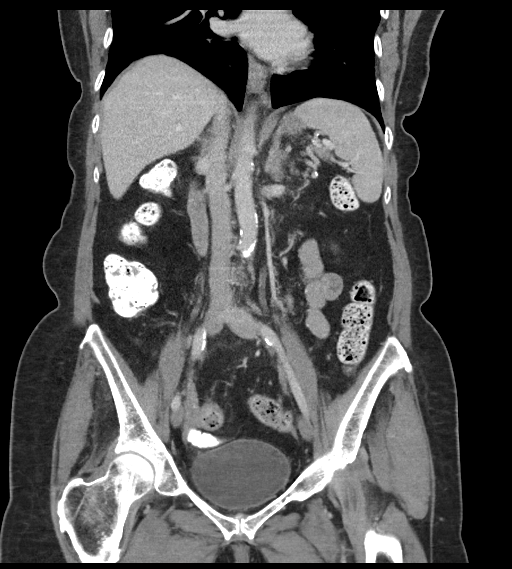

[15 of 46 positions shown; findings below may reference images not displayed]

FINDINGS: Lower chest: New and enlarging basilar pulmonary nodules measure up
to 2.0 cm in the left lower lobe (image 27), previously 1.0 cm. No
pleural fluid. Heart is mildly enlarged. No pericardial or pleural
effusion. Distal esophagus is grossly unremarkable.

Hepatobiliary: Heterogeneous and mildly hyperattenuating mass in the
dome of the liver measures 4.7 cm, previously 2.9 cm. Heterogeneous
left hepatic lobe mass measures 3.4 cm, previously 1.5 cm. 4 mm
low-attenuation lesion in the periphery of the right hepatic lobe is
too small to characterize. Stones are seen in the gallbladder. No
biliary ductal dilatation.

Pancreas: Negative.

Spleen: Negative.

Adrenals/Urinary Tract: Adrenal glands and right kidney are
unremarkable. A somewhat ill-defined hypoattenuating mass in the
left renal hilum measures approximately 2.1 x 2.6 cm on coronal
image 58, compared to 1.9 x 2.6 cm on 10/15/2016. There may be
associated pelvocaliectasis versus mild hydronephrosis. Increased
stranding adjacent to the upper pole left kidney. Ureters are
decompressed. Bladder is grossly unremarkable.

Stomach/Bowel: Tiny hiatal hernia. Stomach is decompressed. Small
bowel and appendix are unremarkable as is the majority of the colon.
There is soft tissue thickening in the sigmoid colon, roughly
measuring 2.5 x 4.0 cm, stable to minimally enlarged.

Vascular/Lymphatic: Atherosclerotic calcification of the arterial
vasculature without abdominal aortic aneurysm. Periaortic lymph
nodes measure up to 8 mm in the left periaortic station, previously
5 mm.

Reproductive: Hysterectomy. Mixed solid and cystic left adnexal
lesion measures approximately 2.5 x 3.5 cm, similar to prior exams
and likely ovarian in origin.

Other: No free fluid.  Mesenteries and peritoneum are unremarkable.

Musculoskeletal: No worrisome lytic or sclerotic lesions.
Degenerative changes in the spine and sacroiliac joints.
IMPRESSION: 1. Worsening pulmonary and hepatic metastatic disease. Primary
sigmoid lesion is stable to minimally enlarged.
2. Stable to slight enlargement of a left perinephric mass, likely a
metastasis. Renal cell carcinoma is not excluded. Associated
pelvocaliectasis versus mild hydronephrosis.
3. Borderline left periaortic lymph nodes, slightly enlarged.
4. Cholelithiasis.
5.  Aortic atherosclerosis ([id number]-170.0).
6. Cystic and solid lesion in the left adnexa, as on prior exams,
likely ovarian in origin.

## 2020-10-07 IMAGING — CT CT ABD-PELV W/ CM
2 of 5 series · 14 of 46 positions shown, 16 images · IV contrast (omnipaque)
Comparison: 04/30/2018 CT abdomen/pelvis.

CLINICAL DATA: Stage IV metastatic sigmoid colon cancer diagnosed
August 2016 status post chemotherapy and sigmoid colon stent
placement 08/11/2017. Clinical concern for recurrent bowel
obstruction.

EXAM:
CT ABDOMEN AND PELVIS WITH CONTRAST
TECHNIQUE: Multidetector CT imaging of the abdomen and pelvis was performed
using the standard protocol following bolus administration of
intravenous contrast.
CONTRAST:  100mL OMNIPAQUE IOHEXOL 300 MG/ML  SOLN

[Series 2: axial st · axial · 0.85mm/px · z∈[-592,-182]mm · 11 of 96 slices shown, 13 images]
[im 7/96  soft-tissue]
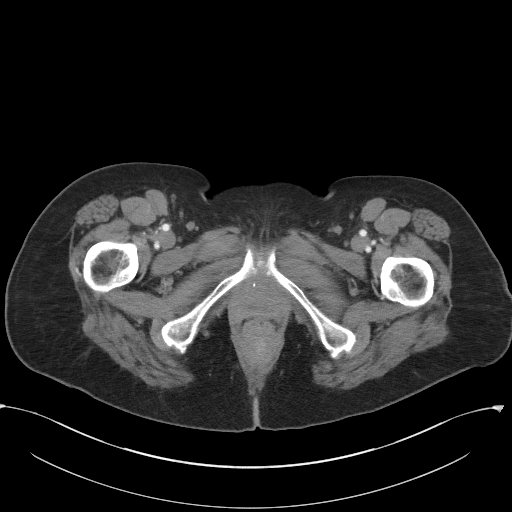
[im 7/96  bone]
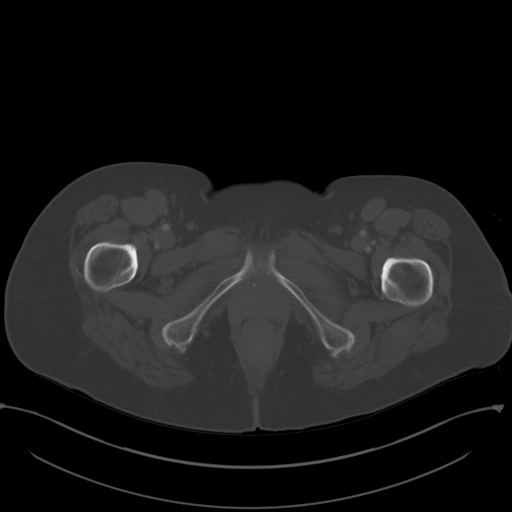
[im 13/96  soft-tissue]
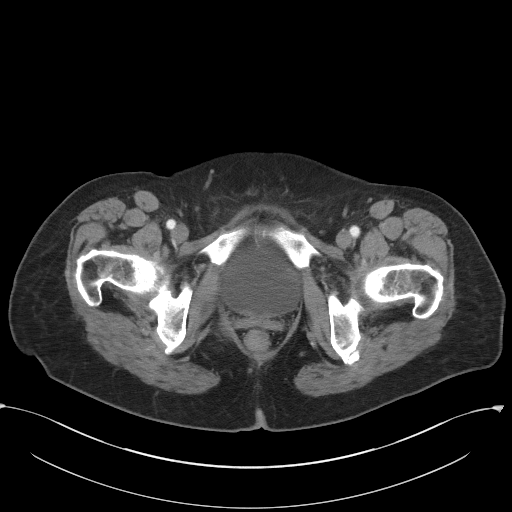
[im 26/96  soft-tissue]
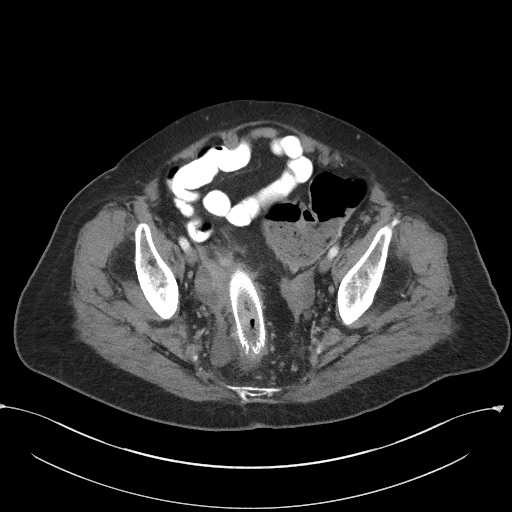
[im 32/96  soft-tissue]
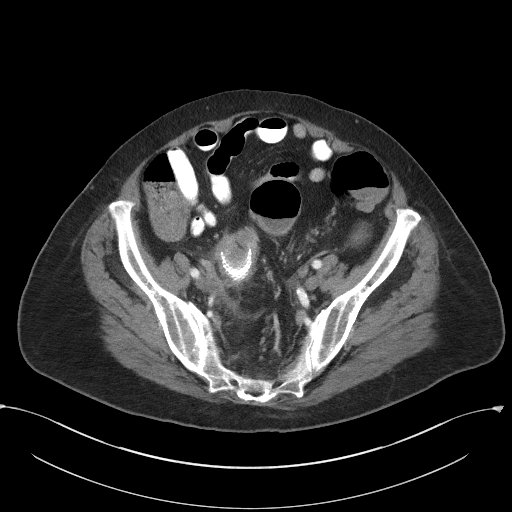
[im 39/96  soft-tissue]
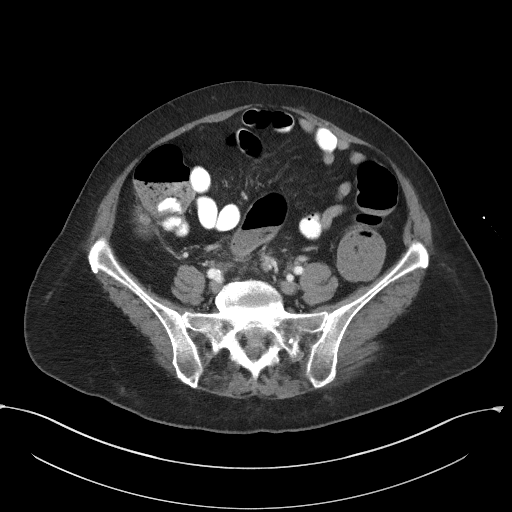
[im 51/96  soft-tissue]
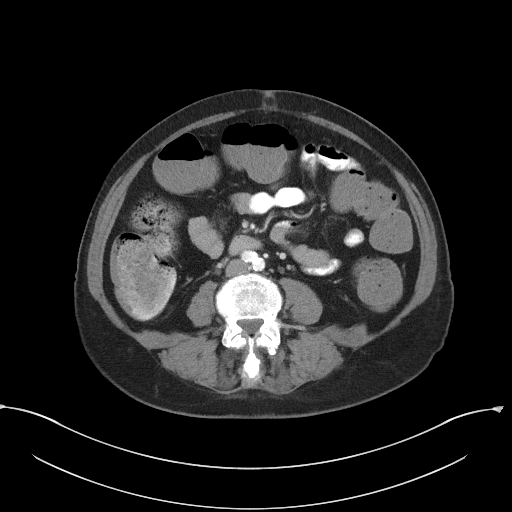
[im 58/96  soft-tissue]
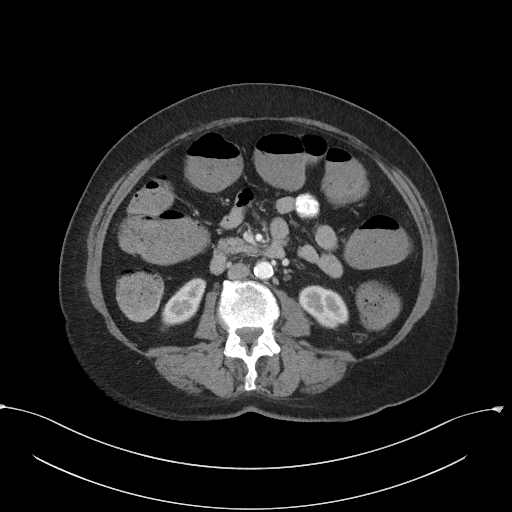
[im 64/96  soft-tissue]
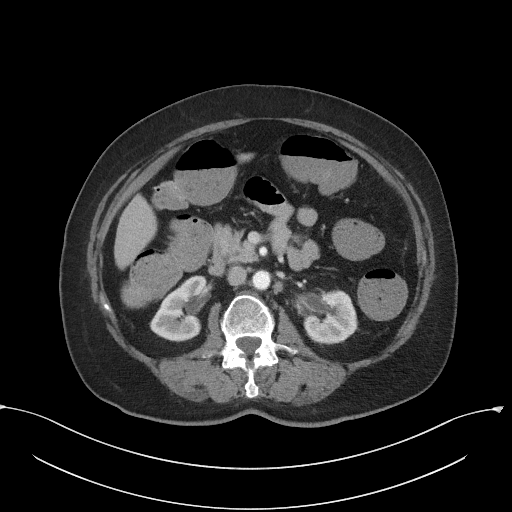
[im 70/96  soft-tissue]
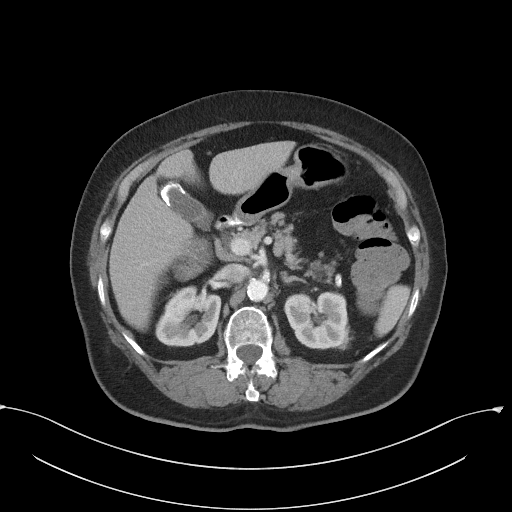
[im 70/96  bone]
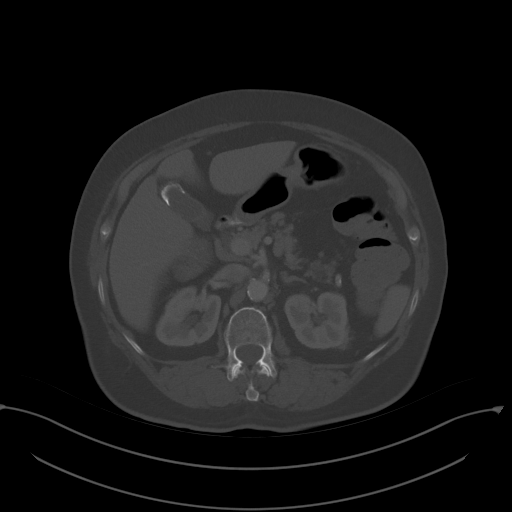
[im 83/96  soft-tissue]
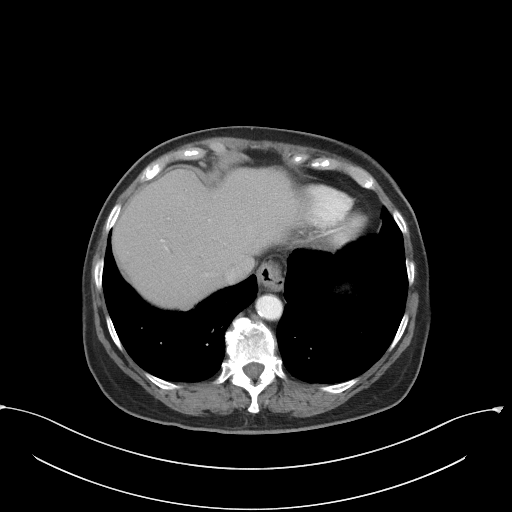
[im 89/96  soft-tissue]
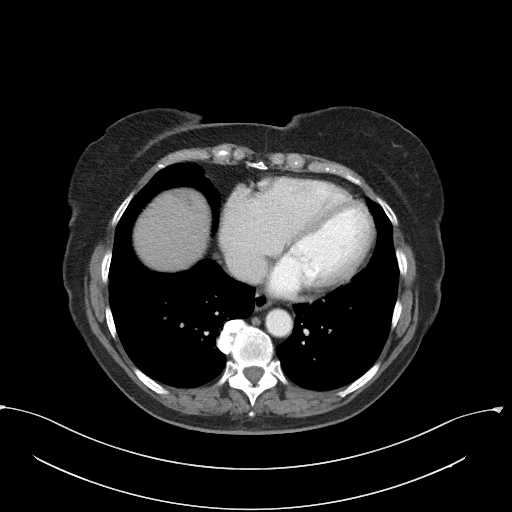

[Series 4: coronal st · coronal · 0.72mm/px · 3 of 101 slices shown]
[im 34/101  soft-tissue]
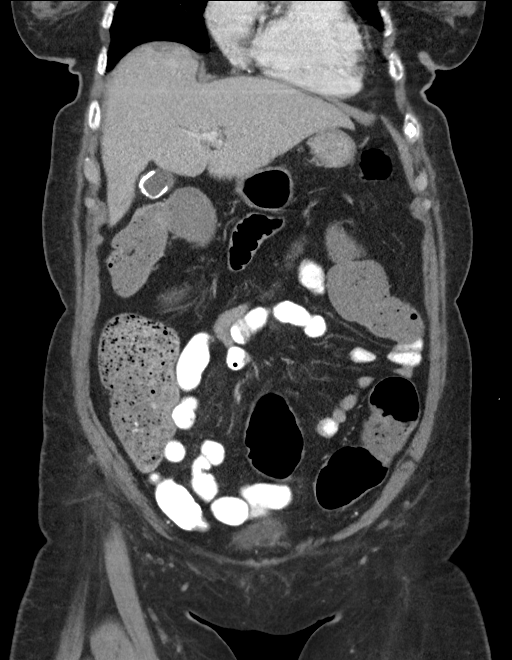
[im 45/101  soft-tissue]
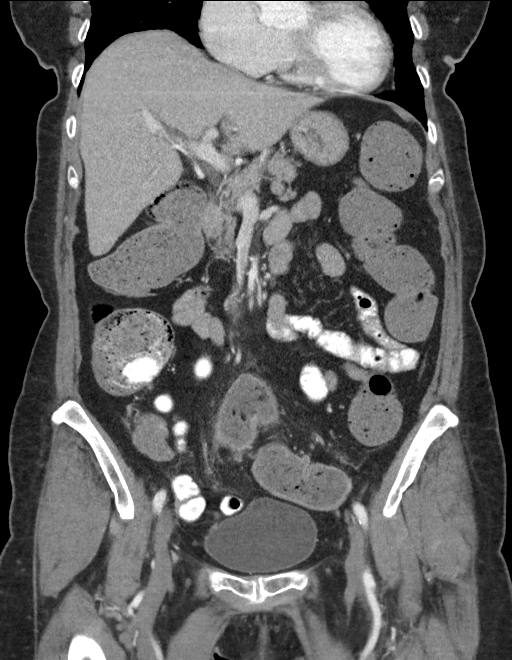
[im 56/101  soft-tissue]
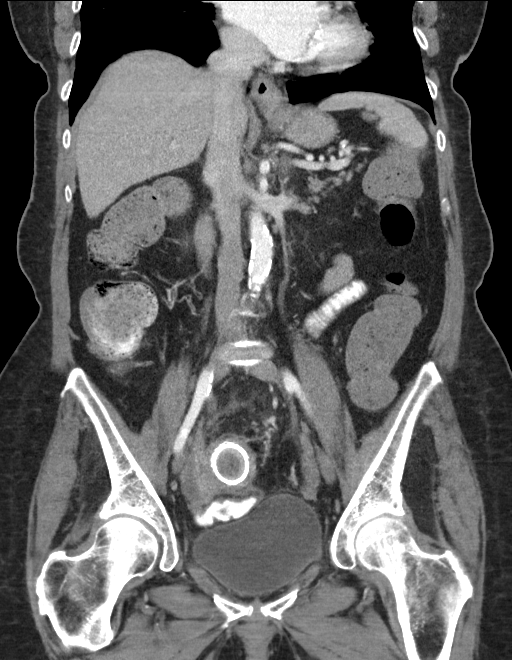

[14 of 46 positions shown; findings below may reference images not displayed]

FINDINGS: Lower chest: Multiple solid pulmonary nodules scattered throughout
both lung bases measuring up to 1.4 cm in the posterior right lower
lobe (series 6/image 7), not appreciably changed since 04/30/2018
chest CT.

Hepatobiliary: Anterior liver dome 1.8 cm hypodense lesion (series
2/image 80), previously 1.8 cm using similar measurement technique,
stable. Hypodense 0.7 cm lateral segment left liver lobe lesion
(series 2/image 21), previously 0.6 cm, not appreciably changed. No
new liver lesions. Cholelithiasis versus porcelain gallbladder,
unchanged. Bile ducts are stable with CBD diameter 7 mm and minimal
central intrahepatic biliary ductal dilatation. No radiopaque
choledocholithiasis.

Pancreas: Normal, with no mass or duct dilation.

Spleen: Normal size. No mass.

Adrenals/Urinary Tract: Normal adrenals. No hydronephrosis. Medial
interpolar left renal 2.4 cm mass (series 2/image 31), previously
2.3 cm, not appreciably changed. Subcentimeter hypodense posterior
lower right renal cortical lesion is too small to characterize and
unchanged. No new renal lesions. Normal bladder.

Stomach/Bowel: Small hiatal hernia. Otherwise normal nondistended
stomach. Normal caliber small bowel with no small bowel wall
thickening. Oral contrast transits to the ascending colon. Stable
position of distal sigmoid colonic stent. Increased soft tissue
density along the external and internal surfaces of the sigmoid
colonic stent with associated luminal narrowing in the midportion of
the stent (series 5/image 59 and series 2/image 88) with associated
mildly increased surrounding fat stranding, which encompasses the
tip of the nondilated appendix (series 2/image 71). New moderate
diffuse dilatation of the large bowel proximal to the sigmoid stent
with fluid levels throughout the dilated large bowel. Collapsed
rectum below the level of the sigmoid stent.

Vascular/Lymphatic: Atherosclerotic nonaneurysmal abdominal aorta.
Patent portal, splenic, hepatic and renal veins. No pathologically
enlarged lymph nodes in the abdomen or pelvis.

Reproductive: Status post hysterectomy. Soft tissue density
bilateral adnexal masses appears stable, measuring 4.3 x 3.0 cm on
the left (series 2/image 71), previously 4.3 x 3.1 cm, and measuring
3.8 x 2.6 cm on the right (series 2/image 71), previously 3.7 x
cm.

Other: New trace ascites in the deep pelvis.  No pneumoperitoneum.

Musculoskeletal: No aggressive appearing focal osseous lesions.
Moderate thoracolumbar spondylosis.
IMPRESSION: 1. Increased soft tissue density along the internal and external
surfaces of the sigmoid colonic stent with associated luminal
narrowing in the midportion of the stent. New moderate dilatation of
the large bowel upstream to the sigmoid stent. Findings are
compatible with developing distal large-bowel obstruction. The
increased soft tissue along the stent could represent recurrent
tumor or inflammatory wall thickening.
2. The tip of the nondilated appendix is adjacent to the sigmoid
stent and is now encompassed by this nonspecific soft tissue.
3. No abscess or perforation.  New trace deep pelvic ascites.
4. Pulmonary metastases at the lung bases are stable. Liver and left
kidney metastases are stable. Bilateral adnexal soft tissue masses
are stable.
5.  Aortic Atherosclerosis ([id number]-ESI.I).

These results will be called to the ordering clinician or
representative by the [HOSPITAL] at the imaging location.
